# Patient Record
Sex: Female | Born: 1975 | Race: Black or African American | Hispanic: No | Marital: Single | Smoking: Former smoker
Health system: Southern US, Community
[De-identification: ages and names within clinical notes are randomized; demographics above are authoritative.]

## PROBLEM LIST (undated history)

## (undated) ENCOUNTER — Ambulatory Visit (HOSPITAL_COMMUNITY): Discharge status: Absent without leave | Payer: BLUE CROSS/BLUE SHIELD

## (undated) DIAGNOSIS — F319 Bipolar disorder, unspecified: Secondary | ICD-10-CM

## (undated) DIAGNOSIS — F32A Depression, unspecified: Secondary | ICD-10-CM

## (undated) DIAGNOSIS — M1990 Unspecified osteoarthritis, unspecified site: Secondary | ICD-10-CM

## (undated) DIAGNOSIS — I10 Essential (primary) hypertension: Secondary | ICD-10-CM

## (undated) DIAGNOSIS — K219 Gastro-esophageal reflux disease without esophagitis: Secondary | ICD-10-CM

## (undated) DIAGNOSIS — G4730 Sleep apnea, unspecified: Secondary | ICD-10-CM

## (undated) DIAGNOSIS — F4310 Post-traumatic stress disorder, unspecified: Secondary | ICD-10-CM

## (undated) DIAGNOSIS — B1920 Unspecified viral hepatitis C without hepatic coma: Secondary | ICD-10-CM

## (undated) DIAGNOSIS — F419 Anxiety disorder, unspecified: Secondary | ICD-10-CM

## (undated) HISTORY — DX: Essential (primary) hypertension: I10

## (undated) HISTORY — DX: Post-traumatic stress disorder, unspecified: F43.10

## (undated) HISTORY — PX: CHOLECYSTECTOMY: SHX55

## (undated) HISTORY — DX: Depression, unspecified: F32.A

## (undated) HISTORY — DX: Unspecified viral hepatitis C without hepatic coma: B19.20

## (undated) HISTORY — PX: ABDOMINAL HYSTERECTOMY: SHX81

## (undated) HISTORY — PX: FOOT SURGERY: SHX648

## (undated) HISTORY — DX: Bipolar disorder, unspecified: F31.9

## (undated) HISTORY — PX: DILATION AND CURETTAGE OF UTERUS: SHX78

## (undated) HISTORY — PX: KNEE SURGERY: SHX244

---

## 1998-09-25 ENCOUNTER — Ambulatory Visit (HOSPITAL_COMMUNITY): Admission: RE | Admit: 1998-09-25 | Discharge: 1998-09-25 | Payer: Self-pay | Admitting: Orthopedic Surgery

## 1998-09-25 ENCOUNTER — Encounter: Payer: Self-pay | Admitting: Orthopedic Surgery

## 2011-03-13 ENCOUNTER — Emergency Department (HOSPITAL_COMMUNITY): Payer: BC Managed Care – PPO

## 2011-03-13 ENCOUNTER — Emergency Department (HOSPITAL_COMMUNITY)
Admission: EM | Admit: 2011-03-13 | Discharge: 2011-03-13 | Disposition: A | Discharge status: Home / Self Care | Payer: BC Managed Care – PPO | Attending: Emergency Medicine | Admitting: Emergency Medicine

## 2011-03-13 DIAGNOSIS — M79609 Pain in unspecified limb: Secondary | ICD-10-CM | POA: Insufficient documentation

## 2011-03-13 DIAGNOSIS — S60229A Contusion of unspecified hand, initial encounter: Secondary | ICD-10-CM | POA: Insufficient documentation

## 2011-03-13 DIAGNOSIS — W230XXA Caught, crushed, jammed, or pinched between moving objects, initial encounter: Secondary | ICD-10-CM | POA: Insufficient documentation

## 2012-11-07 ENCOUNTER — Emergency Department (HOSPITAL_COMMUNITY)
Admission: EM | Admit: 2012-11-07 | Discharge: 2012-11-07 | Disposition: A | Discharge status: Home / Self Care | Payer: Self-pay | Attending: Family Medicine | Admitting: Family Medicine

## 2012-11-07 ENCOUNTER — Encounter (HOSPITAL_COMMUNITY): Payer: Self-pay | Admitting: Emergency Medicine

## 2012-11-07 ENCOUNTER — Emergency Department (HOSPITAL_COMMUNITY): Payer: Self-pay

## 2012-11-07 DIAGNOSIS — Z79899 Other long term (current) drug therapy: Secondary | ICD-10-CM | POA: Insufficient documentation

## 2012-11-07 DIAGNOSIS — R059 Cough, unspecified: Secondary | ICD-10-CM | POA: Insufficient documentation

## 2012-11-07 DIAGNOSIS — R52 Pain, unspecified: Secondary | ICD-10-CM | POA: Insufficient documentation

## 2012-11-07 DIAGNOSIS — R05 Cough: Secondary | ICD-10-CM | POA: Insufficient documentation

## 2012-11-07 DIAGNOSIS — F411 Generalized anxiety disorder: Secondary | ICD-10-CM | POA: Insufficient documentation

## 2012-11-07 DIAGNOSIS — J3489 Other specified disorders of nose and nasal sinuses: Secondary | ICD-10-CM | POA: Insufficient documentation

## 2012-11-07 DIAGNOSIS — F419 Anxiety disorder, unspecified: Secondary | ICD-10-CM | POA: Insufficient documentation

## 2012-11-07 DIAGNOSIS — J069 Acute upper respiratory infection, unspecified: Secondary | ICD-10-CM | POA: Insufficient documentation

## 2012-11-07 HISTORY — DX: Anxiety disorder, unspecified: F41.9

## 2012-11-07 MED ORDER — HYDROCODONE-HOMATROPINE 5-1.5 MG/5ML PO SYRP: 5.0000 mL | ORAL_SOLUTION | ORAL | Status: DC | PRN

## 2012-11-07 MED ORDER — GUAIFENESIN ER 600 MG PO TB12: 1200.0000 mg | ORAL_TABLET | Freq: Two times a day (BID) | ORAL | Status: DC

## 2012-11-07 NOTE — ED Notes (Signed)
Pt reports she has had cough, nasal congestion, bodyaches  X 2 days. Pt is in no distress.

## 2012-11-07 NOTE — ED Notes (Signed)
Pt c/o URI and productive cough with body aches x 3 days

## 2012-11-12 NOTE — ED Provider Notes (Signed)
History     CSN: [id number]  Arrival date & time 11/07/12  4098   None     Chief Complaint  Patient presents with  . URI  . Cough  . Generalized Body Aches    (Consider location/radiation/quality/duration/timing/severity/associated sxs/prior treatment) HPI SUBJECTIVE:  Theresa Cantrell is a 37 y.o. female who complains of coryza, congestion, nasal blockage, productive cough, myalgias, headache, fever, chills, hoarseness and pain with cough and decreased appetite for 2 days. She denies a history of  asthma.    OBJECTIVE:   Past Medical History  Diagnosis Date  . Anxiety     History reviewed. No pertinent past surgical history.  History reviewed. No pertinent family history.  History  Substance Use Topics  . Smoking status: Not on file  . Smokeless tobacco: Not on file  . Alcohol Use:     OB History    Grav Para Term Preterm Abortions TAB SAB Ect Mult Living                  Review of Systems Ten systems reviewed and are negative for acute change, except as noted in the HPI.   Allergies  Sulfa antibiotics and Ultram  Home Medications   Current Outpatient Rx  Name  Route  Sig  Dispense  Refill  . GUAIFENESIN ER 600 MG PO TB12   Oral   Take 2 tablets (1,200 mg total) by mouth 2 (two) times daily.   40 tablet   0   . HYDROCODONE-HOMATROPINE 5-1.5 MG/5ML PO SYRP   Oral   Take 5 mLs by mouth every 4 (four) hours as needed for cough.   120 mL   0     BP 119/67  Pulse 81  Temp 98.5 F (36.9 C) (Oral)  Resp 18  SpO2 97%  Physical Exam Appears moderately ill but not toxic; temperature as noted in vitals. Ears normal. Eyes:glassy appearance, no discharge  Heart: RRR, NO M/G/R Throat and pharynx normal.   Neck supple. No adenopathyhy in the neck.  Sinuses non tender.  The chest is clear. Abdomen is soft and nontende  ED Course  Procedures (including critical care time)  Labs Reviewed - No data to display No results found.   1. URI (upper  respiratory infection)       MDM  Pt CXR negative for acute infiltrate. Patients symptoms are consistent with URI, likely viral etiology. Discussed that antibiotics are not indicated for viral infections. Pt will be discharged with symptomatic treatment.  Verbalizes understanding and is agreeable with plan. Pt is hemodynamically stable & in NAD prior to dc.         Arthor Captain, PA-C 11/12/12 1432

## 2012-11-14 NOTE — ED Provider Notes (Signed)
Medical screening examination/treatment/procedure(s) were performed by non-physician practitioner and as supervising physician I was immediately available for consultation/collaboration.  Flint Melter, MD 11/14/12 [phone]

## 2013-09-10 ENCOUNTER — Emergency Department (HOSPITAL_COMMUNITY): Payer: No Typology Code available for payment source

## 2013-09-10 ENCOUNTER — Encounter (HOSPITAL_COMMUNITY): Payer: Self-pay | Admitting: Emergency Medicine

## 2013-09-10 ENCOUNTER — Emergency Department (HOSPITAL_COMMUNITY)
Admission: EM | Admit: 2013-09-10 | Discharge: 2013-09-10 | Disposition: A | Discharge status: Home / Self Care | Payer: No Typology Code available for payment source | Attending: Emergency Medicine | Admitting: Emergency Medicine

## 2013-09-10 DIAGNOSIS — M129 Arthropathy, unspecified: Secondary | ICD-10-CM | POA: Insufficient documentation

## 2013-09-10 DIAGNOSIS — M5430 Sciatica, unspecified side: Secondary | ICD-10-CM | POA: Insufficient documentation

## 2013-09-10 DIAGNOSIS — Z79899 Other long term (current) drug therapy: Secondary | ICD-10-CM | POA: Insufficient documentation

## 2013-09-10 DIAGNOSIS — S0993XA Unspecified injury of face, initial encounter: Secondary | ICD-10-CM | POA: Insufficient documentation

## 2013-09-10 DIAGNOSIS — M5441 Lumbago with sciatica, right side: Secondary | ICD-10-CM

## 2013-09-10 DIAGNOSIS — F411 Generalized anxiety disorder: Secondary | ICD-10-CM | POA: Insufficient documentation

## 2013-09-10 DIAGNOSIS — IMO0002 Reserved for concepts with insufficient information to code with codable children: Secondary | ICD-10-CM | POA: Insufficient documentation

## 2013-09-10 DIAGNOSIS — Y92410 Unspecified street and highway as the place of occurrence of the external cause: Secondary | ICD-10-CM | POA: Insufficient documentation

## 2013-09-10 DIAGNOSIS — M25522 Pain in left elbow: Secondary | ICD-10-CM

## 2013-09-10 DIAGNOSIS — S6990XA Unspecified injury of unspecified wrist, hand and finger(s), initial encounter: Secondary | ICD-10-CM | POA: Insufficient documentation

## 2013-09-10 DIAGNOSIS — Y9389 Activity, other specified: Secondary | ICD-10-CM | POA: Insufficient documentation

## 2013-09-10 DIAGNOSIS — F17200 Nicotine dependence, unspecified, uncomplicated: Secondary | ICD-10-CM | POA: Insufficient documentation

## 2013-09-10 DIAGNOSIS — S59909A Unspecified injury of unspecified elbow, initial encounter: Secondary | ICD-10-CM | POA: Insufficient documentation

## 2013-09-10 HISTORY — DX: Unspecified osteoarthritis, unspecified site: M19.90

## 2013-09-10 MED ORDER — PREDNISONE 20 MG PO TABS
60.0000 mg | ORAL_TABLET | Freq: Once | ORAL | Status: AC
  Administered 2013-09-10: 60 mg via ORAL
  Filled 2013-09-10: qty 3

## 2013-09-10 MED ORDER — PREDNISONE 20 MG PO TABS: 60.0000 mg | ORAL_TABLET | Freq: Every day | ORAL | Status: DC

## 2013-09-10 MED ORDER — HYDROCODONE-ACETAMINOPHEN 5-325 MG PO TABS: 1.0000 | ORAL_TABLET | Freq: Four times a day (QID) | ORAL | Status: DC | PRN

## 2013-09-10 MED ORDER — HYDROCODONE-ACETAMINOPHEN 5-325 MG PO TABS
1.0000 | ORAL_TABLET | Freq: Once | ORAL | Status: AC
  Administered 2013-09-10: 1 via ORAL
  Filled 2013-09-10: qty 1
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
## 2013-09-10 NOTE — ED Notes (Signed)
Patient transported to X-ray 

## 2013-09-10 NOTE — ED Notes (Addendum)
Patient transported to X-ray 

## 2013-09-10 NOTE — ED Notes (Signed)
Pt returns xray. 

## 2013-09-12 NOTE — ED Provider Notes (Signed)
Medical screening examination/treatment/procedure(s) were performed by non-physician practitioner and as supervising physician I was immediately available for consultation/collaboration.  EKG Interpretation   None        Amillya Chavira, MD 09/12/13 0324 

## 2014-01-02 ENCOUNTER — Other Ambulatory Visit: Payer: Self-pay | Admitting: *Deleted

## 2014-01-02 DIAGNOSIS — Z780 Asymptomatic menopausal state: Secondary | ICD-10-CM

## 2014-01-10 ENCOUNTER — Encounter: Payer: Self-pay | Admitting: *Deleted

## 2014-02-05 ENCOUNTER — Other Ambulatory Visit: Payer: BC Managed Care – PPO

## 2014-02-05 DIAGNOSIS — Z780 Asymptomatic menopausal state: Secondary | ICD-10-CM

## 2014-02-06 LAB — TSH: TSH: 0.414 u[IU]/mL (ref 0.350–4.500)

## 2014-02-06 LAB — FOLLICLE STIMULATING HORMONE: FSH: 5.1 m[IU]/mL

## 2014-02-07 ENCOUNTER — Encounter: Payer: Self-pay | Admitting: Family Medicine

## 2014-02-07 ENCOUNTER — Ambulatory Visit (INDEPENDENT_AMBULATORY_CARE_PROVIDER_SITE_OTHER): Payer: BC Managed Care – PPO | Admitting: Family Medicine

## 2014-02-07 VITALS — BP 125/85 | HR 80 | Temp 97.4°F | Ht 65.0 in | Wt 153.5 lb

## 2014-02-07 DIAGNOSIS — N951 Menopausal and female climacteric states: Secondary | ICD-10-CM

## 2014-02-07 DIAGNOSIS — R232 Flushing: Secondary | ICD-10-CM

## 2014-02-07 DIAGNOSIS — Z1239 Encounter for other screening for malignant neoplasm of breast: Secondary | ICD-10-CM

## 2014-02-07 DIAGNOSIS — G629 Polyneuropathy, unspecified: Secondary | ICD-10-CM | POA: Insufficient documentation

## 2014-02-07 DIAGNOSIS — G589 Mononeuropathy, unspecified: Secondary | ICD-10-CM

## 2014-02-07 DIAGNOSIS — R634 Abnormal weight loss: Secondary | ICD-10-CM | POA: Insufficient documentation

## 2014-02-07 LAB — CBC
HCT: 40.5 % (ref 36.0–46.0)
Hemoglobin: 13.7 g/dL (ref 12.0–15.0)
MCH: 30.2 pg (ref 26.0–34.0)
MCHC: 33.8 g/dL (ref 30.0–36.0)
MCV: 89.4 fL (ref 78.0–100.0)
PLATELETS: 250 10*3/uL (ref 150–400)
RBC: 4.53 MIL/uL (ref 3.87–5.11)
RDW: 13.8 % (ref 11.5–15.5)
WBC: 6.3 10*3/uL (ref 4.0–10.5)

## 2014-02-07 NOTE — Patient Instructions (Signed)
Menopause Menopause is the normal time of life when menstrual periods stop completely. Menopause is complete when you have missed 12 consecutive menstrual periods. It usually occurs between the ages of 8 years and 48 years. Very rarely does a woman develop menopause before the age of 60 years. At menopause, your ovaries stop producing the female hormones estrogen and progesterone. This can cause undesirable symptoms and also affect your health. Sometimes the symptoms may occur 4 5 years before the menopause begins. There is no relationship between menopause and:  Oral contraceptives.  Number of children you had.  Race.  The age your menstrual periods started (menarche). Heavy smokers and very thin women may develop menopause earlier in life. CAUSES  The ovaries stop producing the female hormones estrogen and progesterone.  Other causes include:  Surgery to remove both ovaries.  The ovaries stop functioning for no known reason.  Tumors of the pituitary gland in the brain.  Medical disease that affects the ovaries and hormone production.  Radiation treatment to the abdomen or pelvis.  Chemotherapy that affects the ovaries. SYMPTOMS   Hot flashes.  Night sweats.  Decrease in sex drive.  Vaginal dryness and thinning of the vagina causing painful intercourse.  Dryness of the skin and developing wrinkles.  Headaches.  Tiredness.  Irritability.  Memory problems.  Weight gain.  Bladder infections.  Hair growth of the face and chest.  Infertility. More serious symptoms include:  Loss of bone (osteoporosis) causing breaks (fractures).  Depression.  Hardening and narrowing of the arteries (atherosclerosis) causing heart attacks and strokes. DIAGNOSIS   When the menstrual periods have stopped for 12 straight months.  Physical exam.  Hormone studies of the blood. TREATMENT  There are many treatment choices and nearly as many questions about them. The  decisions to treat or not to treat menopausal changes is an individual choice made with your health care provider. Your health care provider can discuss the treatments with you. Together, you can decide which treatment will work best for you. Your treatment choices may include:   Hormone therapy (estrogen and progesterone).  Non-hormonal medicines.  Treating the individual symptoms with medicine (for example antidepressants for depression).  Herbal medicines that may help specific symptoms.  Counseling by a psychiatrist or psychologist.  Group therapy.  Lifestyle changes including:  Eating healthy.  Regular exercise.  Limiting caffeine and alcohol.  Stress management and meditation.  No treatment. HOME CARE INSTRUCTIONS   Take the medicine your health care provider gives you as directed.  Get plenty of sleep and rest.  Exercise regularly.  Eat a diet that contains calcium (good for the bones) and soy products (acts like estrogen hormone).  Avoid alcoholic beverages.  Do not smoke.  If you have hot flashes, dress in layers.  Take supplements, calcium, and vitamin D to strengthen bones.  You can use over-the-counter lubricants or moisturizers for vaginal dryness.  Group therapy is sometimes very helpful.  Acupuncture may be helpful in some cases. SEEK MEDICAL CARE IF:   You are not sure you are in menopause.  You are having menopausal symptoms and need advice and treatment.  You are still having menstrual periods after age 69 years.  You have pain with intercourse.  Menopause is complete (no menstrual period for 12 months) and you develop vaginal bleeding.  You need a referral to a specialist (gynecologist, psychiatrist, or psychologist) for treatment. SEEK IMMEDIATE MEDICAL CARE IF:   You have severe depression.  You have excessive vaginal bleeding.  You fell and think you have a broken bone.  You have pain when you urinate.  You develop leg or  chest pain.  You have a fast pounding heart beat (palpitations).  You have severe headaches.  You develop vision problems.  You feel a lump in your breast.  You have abdominal pain or severe indigestion. Document Released: 01/09/2004 Document Revised: 06/21/2013 Document Reviewed: 05/18/2013 [facility] Patient Information 2014 [place], [place].

## 2014-02-07 NOTE — Progress Notes (Signed)
    Subjective:    Patient ID: Theresa Cantrell is a 38 y.o. female presenting with Menopause  on 02/07/2014  HPI: Had partial hysterectomy 14 years ago. Now with hot flashes x 1 year.  Also with vaginal dryness. Night sweats, drenching the bed. Additionally has 80 lb unintended weight loss. No MD visit in some time.  Review of Systems  Constitutional: Negative for fever and chills.  Respiratory: Negative for shortness of breath.   Cardiovascular: Negative for chest pain.  Gastrointestinal: Negative for nausea, vomiting and abdominal pain.  Genitourinary: Positive for vaginal pain. Negative for dysuria.  Skin: Negative for rash.      Objective:    BP 125/85  Pulse 80  Temp(Src) 97.4 F (36.3 C) (Oral)  Ht 5\' 5"  (1.651 m)  Wt 153 lb 8 oz (69.627 kg)  BMI 25.54 kg/m2 Physical Exam  Constitutional: She is oriented to person, place, and time. She appears well-developed and well-nourished. No distress.  HENT:  Head: Normocephalic and atraumatic.  Eyes: No scleral icterus.  Neck: Neck supple.  Cardiovascular: Normal rate.   Pulmonary/Chest: Effort normal.  Abdominal: Soft.  Neurological: She is alert and oriented to person, place, and time.  Skin: Skin is warm and dry.  Psychiatric: She has a normal mood and affect.        Assessment & Plan:  Neuropathy  Hot flashes - Plan: TSH, Follicle stimulating hormone, HIV antibody, CBC, Comprehensive metabolic panel  Weight loss - Plan: TSH, CBC, Comprehensive metabolic panel  Screening for breast cancer - Plan: MM DIGITAL SCREENING BILATERAL  Given weight loss and night sweats, will do more robust panel.  Could be menopausal but would like to rule out other diagnoses.  Also, may need ppd.  Return in about 4 weeks (around 03/07/2014).

## 2014-02-08 ENCOUNTER — Telehealth: Payer: Self-pay

## 2014-02-08 LAB — COMPREHENSIVE METABOLIC PANEL
ALBUMIN: 4 g/dL (ref 3.5–5.2)
ALT: 9 U/L (ref 0–35)
AST: 15 U/L (ref 0–37)
Alkaline Phosphatase: 61 U/L (ref 39–117)
BUN: 14 mg/dL (ref 6–23)
CALCIUM: 9.4 mg/dL (ref 8.4–10.5)
CHLORIDE: 104 meq/L (ref 96–112)
CO2: 25 mEq/L (ref 19–32)
Creat: 0.76 mg/dL (ref 0.50–1.10)
Glucose, Bld: 83 mg/dL (ref 70–99)
POTASSIUM: 4.3 meq/L (ref 3.5–5.3)
Sodium: 138 mEq/L (ref 135–145)
TOTAL PROTEIN: 6.3 g/dL (ref 6.0–8.3)
Total Bilirubin: 0.5 mg/dL (ref 0.2–1.2)

## 2014-02-08 LAB — TSH: TSH: 1.501 u[IU]/mL (ref 0.350–4.500)

## 2014-02-08 LAB — HIV ANTIBODY (ROUTINE TESTING W REFLEX): HIV: NONREACTIVE

## 2014-02-08 LAB — FOLLICLE STIMULATING HORMONE: FSH: 2.6 m[IU]/mL

## 2014-02-08 NOTE — Telephone Encounter (Signed)
Message copied by Geanie Logan on Thu Feb 08, 2014 11:53 AM ------      Message from: Donnamae Jude      Created: Thu Feb 08, 2014 10:30 AM       Labs look completely normal--please inform pt. ------

## 2014-02-08 NOTE — Telephone Encounter (Signed)
Called pt. And informed her of normal labs. Informed pt. To keep follow up appointment in 4 weeks. Pt. Verbalized understanding and had no questions or concerns.

## 2014-02-14 ENCOUNTER — Ambulatory Visit (HOSPITAL_COMMUNITY): Payer: Self-pay

## 2014-02-16 ENCOUNTER — Ambulatory Visit (HOSPITAL_COMMUNITY)
Admission: RE | Admit: 2014-02-16 | Discharge: 2014-02-16 | Disposition: A | Discharge status: Home / Self Care | Payer: BC Managed Care – PPO | Source: Ambulatory Visit | Attending: Family Medicine | Admitting: Family Medicine

## 2014-02-16 DIAGNOSIS — Z1239 Encounter for other screening for malignant neoplasm of breast: Secondary | ICD-10-CM

## 2014-02-16 DIAGNOSIS — Z1231 Encounter for screening mammogram for malignant neoplasm of breast: Secondary | ICD-10-CM | POA: Insufficient documentation

## 2014-02-19 ENCOUNTER — Other Ambulatory Visit: Payer: Self-pay | Admitting: Family Medicine

## 2014-02-19 ENCOUNTER — Telehealth: Payer: Self-pay

## 2014-02-19 DIAGNOSIS — N6310 Unspecified lump in the right breast, unspecified quadrant: Secondary | ICD-10-CM

## 2014-02-19 DIAGNOSIS — R928 Other abnormal and inconclusive findings on diagnostic imaging of breast: Secondary | ICD-10-CM

## 2014-02-19 NOTE — Telephone Encounter (Signed)
Pt. Called. Informed her of results and appointment date, time and location of mammo/ultrasound. Pt. Verbalized understanding. No questions or concerns.

## 2014-02-19 NOTE — Telephone Encounter (Signed)
Message copied by Geanie Logan on Mon Feb 19, 2014 10:00 AM ------      Message from: Donnamae Jude      Created: Fri Feb 16, 2014  3:26 PM       Please call pt. And schedule f/u mammogram ------

## 2014-02-19 NOTE — Telephone Encounter (Signed)
Appointment at [facility] for [facility] and ultrasound of right breast made for Tuesday 4/28 at [place]. Called pt. No answer. Left message stating we are calling with results and of information of an appointment; the appointment is 02/27/14 Tuesday at [place] at the [place] [address]. Call clinic for more information.

## 2014-02-27 ENCOUNTER — Ambulatory Visit
Admission: RE | Admit: 2014-02-27 | Discharge: 2014-02-27 | Disposition: A | Discharge status: Home / Self Care | Payer: BC Managed Care – PPO | Source: Ambulatory Visit | Attending: Family Medicine | Admitting: Family Medicine

## 2014-02-27 DIAGNOSIS — R928 Other abnormal and inconclusive findings on diagnostic imaging of breast: Secondary | ICD-10-CM

## 2014-03-16 ENCOUNTER — Ambulatory Visit (INDEPENDENT_AMBULATORY_CARE_PROVIDER_SITE_OTHER): Payer: BC Managed Care – PPO | Admitting: Obstetrics & Gynecology

## 2014-03-16 ENCOUNTER — Encounter: Payer: Self-pay | Admitting: Obstetrics & Gynecology

## 2014-03-16 ENCOUNTER — Telehealth: Payer: Self-pay | Admitting: General Practice

## 2014-03-16 VITALS — BP 113/76 | HR 74 | Temp 97.5°F | Ht 65.0 in | Wt 154.7 lb

## 2014-03-16 DIAGNOSIS — R634 Abnormal weight loss: Secondary | ICD-10-CM

## 2014-03-16 DIAGNOSIS — R61 Generalized hyperhidrosis: Secondary | ICD-10-CM

## 2014-03-16 NOTE — Telephone Encounter (Signed)
Patient called and left message stating she is calling about her test results. Called patient and she stated she forgot to ask this morning if she was tested for hep c or not back in April. Told patient she was not tested for that. Patient verbalized understanding and had no further questions

## 2014-03-16 NOTE — Patient Instructions (Signed)
Menopause Menopause is the normal time of life when menstrual periods stop completely. Menopause is complete when you have missed 12 consecutive menstrual periods. It usually occurs between the ages of 8 years and 48 years. Very rarely does a woman develop menopause before the age of 60 years. At menopause, your ovaries stop producing the female hormones estrogen and progesterone. This can cause undesirable symptoms and also affect your health. Sometimes the symptoms may occur 4 5 years before the menopause begins. There is no relationship between menopause and:  Oral contraceptives.  Number of children you had.  Race.  The age your menstrual periods started (menarche). Heavy smokers and very thin women may develop menopause earlier in life. CAUSES  The ovaries stop producing the female hormones estrogen and progesterone.  Other causes include:  Surgery to remove both ovaries.  The ovaries stop functioning for no known reason.  Tumors of the pituitary gland in the brain.  Medical disease that affects the ovaries and hormone production.  Radiation treatment to the abdomen or pelvis.  Chemotherapy that affects the ovaries. SYMPTOMS   Hot flashes.  Night sweats.  Decrease in sex drive.  Vaginal dryness and thinning of the vagina causing painful intercourse.  Dryness of the skin and developing wrinkles.  Headaches.  Tiredness.  Irritability.  Memory problems.  Weight gain.  Bladder infections.  Hair growth of the face and chest.  Infertility. More serious symptoms include:  Loss of bone (osteoporosis) causing breaks (fractures).  Depression.  Hardening and narrowing of the arteries (atherosclerosis) causing heart attacks and strokes. DIAGNOSIS   When the menstrual periods have stopped for 12 straight months.  Physical exam.  Hormone studies of the blood. TREATMENT  There are many treatment choices and nearly as many questions about them. The  decisions to treat or not to treat menopausal changes is an individual choice made with your health care provider. Your health care provider can discuss the treatments with you. Together, you can decide which treatment will work best for you. Your treatment choices may include:   Hormone therapy (estrogen and progesterone).  Non-hormonal medicines.  Treating the individual symptoms with medicine (for example antidepressants for depression).  Herbal medicines that may help specific symptoms.  Counseling by a psychiatrist or psychologist.  Group therapy.  Lifestyle changes including:  Eating healthy.  Regular exercise.  Limiting caffeine and alcohol.  Stress management and meditation.  No treatment. HOME CARE INSTRUCTIONS   Take the medicine your health care provider gives you as directed.  Get plenty of sleep and rest.  Exercise regularly.  Eat a diet that contains calcium (good for the bones) and soy products (acts like estrogen hormone).  Avoid alcoholic beverages.  Do not smoke.  If you have hot flashes, dress in layers.  Take supplements, calcium, and vitamin D to strengthen bones.  You can use over-the-counter lubricants or moisturizers for vaginal dryness.  Group therapy is sometimes very helpful.  Acupuncture may be helpful in some cases. SEEK MEDICAL CARE IF:   You are not sure you are in menopause.  You are having menopausal symptoms and need advice and treatment.  You are still having menstrual periods after age 69 years.  You have pain with intercourse.  Menopause is complete (no menstrual period for 12 months) and you develop vaginal bleeding.  You need a referral to a specialist (gynecologist, psychiatrist, or psychologist) for treatment. SEEK IMMEDIATE MEDICAL CARE IF:   You have severe depression.  You have excessive vaginal bleeding.  You fell and think you have a broken bone.  You have pain when you urinate.  You develop leg or  chest pain.  You have a fast pounding heart beat (palpitations).  You have severe headaches.  You develop vision problems.  You feel a lump in your breast.  You have abdominal pain or severe indigestion. Document Released: 01/09/2004 Document Revised: 06/21/2013 Document Reviewed: 05/18/2013 [facility] Patient Information 2014 [place], [place].

## 2014-03-16 NOTE — Progress Notes (Signed)
Subjective:     Patient ID: Theresa Cantrell, female   DOB: 05-20-1976, 38 y.o.   MRN: [id number]  HPI Pt presents for f/u of night sweats.  She reports that she was recently dx'd with Hepatisit C and wonders if that could be the cause of her sx.  Scheduleded with the hepatologist in 1 week.   Review of Systems     Objective:   Physical Exam    4.17.2015 EXAM:  DIGITAL SCREENING BILATERAL MAMMOGRAM WITH CAD  COMPARISON: None  ACR Breast Density Category b: There are scattered areas of  fibroglandular density.  FINDINGS:  In the right breast, a possible masses warrant further evaluation  with spot compression views and possibly ultrasound. In the left  breast, no findings suspicious for malignancy. Images were processed  with CAD.  IMPRESSION:  Further evaluation is suggested for possible masses in the right  breast.  RECOMMENDATION:  Diagnostic mammogram and possibly ultrasound of the right breast.  (Code:[id number])  The patient will be contacted regarding the findings, and additional  imaging will be scheduled.  BI-RADS CATEGORY 0: Incomplete. Need additional imaging evaluation  and/or prior mammograms for comparison.  02/27/2014 EXAM:  DIGITAL DIAGNOSTIC RIGHT MAMMOGRAM  ULTRASOUND RIGHT BREAST  COMPARISON: None.  ACR Breast Density Category b: There are scattered areas of  fibroglandular density.  FINDINGS:  Spot compression views in the lower outer quadrant of the right  breast, anteriorly confirm the presence of a round isodense mass  with circumscribed margins. An oval low-density mass with partially  circumscribed and partially obscured margins is seen in the lower  outer quadrant, posteriorly.  On physical exam, I palpate normal tissue and subareolar region and  lower outer quadrant of the right breast.  Ultrasound is performed, showing 2 adjacent oval homogeneously  hypoechoic masses with circumscribed margins in the 6 o'clock  position, 3 cm from the nipple  measuring 0.6 x 0.3 x 0.6 cm and 0.7  x 0.3 x 0.7 cm. An oval homogeneously hypoechoic mass with  circumscribed margins is seen in 6 o'clock position, 1 cm nipple  measuring 1.0 x 0.4 x 0.5 cm . An oval homogeneously hypoechoic mass  with circumscribed margins is imaged in the 12 o'clock position, 2  cm nipple measuring 0.8 x 0.7 x 0.8 cm. These are most compatible  fibroadenomas. Options are discussed with the patient including  sonographic follow up, percutaneous biopsy or excision. A lymph node  is seen in the 3 o'clock position, 4 cm from the nipple measuring a  maximum of 0.6 cm.  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 Benign lymph node, right  breast.  RECOMMENDATION:  Diagnostic right mammogram and possibly ultrasound in 6 months.  I have discussed the findings and recommendations with the patient.  Results were also provided in writing at the conclusion of the  visit. If applicable, a reminder letter will be sent to the patient  regarding the next appointment.  BI-RADS CATEGORY 3: Probably benign.   LABS; TSH, FSH normal  Assessment:     Night sweats and weight loss- no GYN explanation for sx.  Pt to see hepatologist next week. Reviewed all labs done on last visit      Plan:     Given info sheet on menopause pt notified that she is NOT menopausal    F/u prn 20 min face to face discussion. All questions answered

## 2014-05-21 ENCOUNTER — Encounter (HOSPITAL_COMMUNITY): Payer: Self-pay | Admitting: Emergency Medicine

## 2014-05-21 ENCOUNTER — Emergency Department (HOSPITAL_COMMUNITY)
Admission: EM | Admit: 2014-05-21 | Discharge: 2014-05-21 | Disposition: A | Discharge status: Home / Self Care | Payer: BC Managed Care – PPO | Attending: Emergency Medicine | Admitting: Emergency Medicine

## 2014-05-21 DIAGNOSIS — F17200 Nicotine dependence, unspecified, uncomplicated: Secondary | ICD-10-CM | POA: Insufficient documentation

## 2014-05-21 DIAGNOSIS — M129 Arthropathy, unspecified: Secondary | ICD-10-CM | POA: Insufficient documentation

## 2014-05-21 DIAGNOSIS — M5430 Sciatica, unspecified side: Secondary | ICD-10-CM | POA: Insufficient documentation

## 2014-05-21 DIAGNOSIS — F411 Generalized anxiety disorder: Secondary | ICD-10-CM | POA: Insufficient documentation

## 2014-05-21 DIAGNOSIS — Z8619 Personal history of other infectious and parasitic diseases: Secondary | ICD-10-CM | POA: Insufficient documentation

## 2014-05-21 DIAGNOSIS — Z79899 Other long term (current) drug therapy: Secondary | ICD-10-CM | POA: Insufficient documentation

## 2014-05-21 DIAGNOSIS — M5432 Sciatica, left side: Secondary | ICD-10-CM

## 2014-05-21 MED ORDER — HYDROCODONE-ACETAMINOPHEN 5-325 MG PO TABS: 1.0000 | ORAL_TABLET | ORAL | Status: DC | PRN

## 2014-05-21 MED ORDER — IBUPROFEN 800 MG PO TABS: 800.0000 mg | ORAL_TABLET | Freq: Three times a day (TID) | ORAL | Status: DC

## 2014-05-21 NOTE — ED Notes (Signed)
Pt c/o right lower back and right buttock pain. States has some "tingling" in right buttock and right posterior thigh. Ambulates without difficulty.

## 2014-05-21 NOTE — ED Notes (Signed)
Lower back pain x 8 days unknown reason no injury she states

## 2014-05-21 NOTE — Discharge Instructions (Signed)
Sciatica Sciatica is pain, weakness, numbness, or tingling along the path of the sciatic nerve. The nerve starts in the lower back and runs down the back of each leg. The nerve controls the muscles in the lower leg and in the back of the knee, while also providing sensation to the back of the thigh, lower leg, and the sole of your foot. Sciatica is a symptom of another medical condition. For instance, nerve damage or certain conditions, such as a herniated disk or bone spur on the spine, pinch or put pressure on the sciatic nerve. This causes the pain, weakness, or other sensations normally associated with sciatica. Generally, sciatica only affects one side of the body. CAUSES   Herniated or slipped disc.  Degenerative disk disease.  A pain disorder involving the narrow muscle in the buttocks (piriformis syndrome).  Pelvic injury or fracture.  Pregnancy.  Tumor (rare). SYMPTOMS  Symptoms can vary from mild to very severe. The symptoms usually travel from the low back to the buttocks and down the back of the leg. Symptoms can include:  Mild tingling or dull aches in the lower back, leg, or hip.  Numbness in the back of the calf or sole of the foot.  Burning sensations in the lower back, leg, or hip.  Sharp pains in the lower back, leg, or hip.  Leg weakness.  Severe back pain inhibiting movement. These symptoms may get worse with coughing, sneezing, laughing, or prolonged sitting or standing. Also, being overweight may worsen symptoms. DIAGNOSIS  Your caregiver will perform a physical exam to look for common symptoms of sciatica. He or she may ask you to do certain movements or activities that would trigger sciatic nerve pain. Other tests may be performed to find the cause of the sciatica. These may include:  Blood tests.  X-rays.  Imaging tests, such as an MRI or CT scan. TREATMENT  Treatment is directed at the cause of the sciatic pain. Sometimes, treatment is not necessary  and the pain and discomfort goes away on its own. If treatment is needed, your caregiver may suggest:  Over-the-counter medicines to relieve pain.  Prescription medicines, such as anti-inflammatory medicine, muscle relaxants, or narcotics.  Applying heat or ice to the painful area.  Steroid injections to lessen pain, irritation, and inflammation around the nerve.  Reducing activity during periods of pain.  Exercising and stretching to strengthen your abdomen and improve flexibility of your spine. Your caregiver may suggest losing weight if the extra weight makes the back pain worse.  Physical therapy.  Surgery to eliminate what is pressing or pinching the nerve, such as a bone spur or part of a herniated disk. HOME CARE INSTRUCTIONS   Only take over-the-counter or prescription medicines for pain or discomfort as directed by your caregiver.  Apply ice to the affected area for 20 minutes, 3-4 times a day for the first 48-72 hours. Then try heat in the same way.  Exercise, stretch, or perform your usual activities if these do not aggravate your pain.  Attend physical therapy sessions as directed by your caregiver.  Keep all follow-up appointments as directed by your caregiver.  Do not wear high heels or shoes that do not provide proper support.  Check your mattress to see if it is too soft. A firm mattress may lessen your pain and discomfort. SEEK IMMEDIATE MEDICAL CARE IF:   You lose control of your bowel or bladder (incontinence).  You have increasing weakness in the lower back, pelvis, buttocks,   or legs.  You have redness or swelling of your back.  You have a burning sensation when you urinate.  You have pain that gets worse when you lie down or awakens you at night.  Your pain is worse than you have experienced in the past.  Your pain is lasting longer than 4 weeks.  You are suddenly losing weight without reason. MAKE SURE YOU:  Understand these  instructions.  Will watch your condition.  Will get help right away if you are not doing well or get worse. Document Released: 10/13/2001 Document Revised: 04/19/2012 Document Reviewed: 02/28/2012 [employer] Patient Information 2015 [employer]. This information is not intended to replace advice given to you by your health care provider. Make sure you discuss any questions you have with your health care provider.  

## 2014-05-21 NOTE — ED Provider Notes (Signed)
CSN: [id number]     Arrival date & time 05/21/14  1430 History   First MD Initiated Contact with Patient 05/21/14 1452     This chart was scribed for non-physician practitioner, Charlann Lange PA-C, working with Neta Ehlers, MD by Forrestine Him, ED Scribe. This patient was seen in room TR09C/TR09C and the patient's care was started at 3:17 PM.   Chief Complaint  Patient presents with  . Back Pain   Patient is a 38 y.o. female presenting with back pain. The history is provided by the patient. No language interpreter was used.  Back Pain Location:  Lumbar spine Radiates to:  Does not radiate Pain severity:  Moderate Pain is:  Same all the time Onset quality:  Gradual Timing:  Constant Progression:  Unchanged Chronicity:  New Relieved by:  None tried Worsened by:  Nothing tried Ineffective treatments:  None tried Associated symptoms: no fever, no numbness and no weakness     HPI Comments: Theresa Cantrell is a 38 y.o. female who presents to the Emergency Department complaining of constant, moderate back pain x 8 days that is unchanged. She denies any recent injury or trauma. No alleviating or aggravating factors at this time. She has not tried anything OTC or any home remedies to manage symptoms. At this time she denies any fever or chills. No weakness, loss of sensation, paresthesia, or numbness. Pt with known allergies to Ultram and Sulfa antibiotics. She has no other pertinent past medical history. No other concerns this visit.   Past Medical History  Diagnosis Date  . Anxiety   . Arthritis   . Hepatitis C    Past Surgical History  Procedure Laterality Date  . Cholecystectomy    . Foot surgery Bilateral     two pins in each foot  . Abdominal hysterectomy    . Dilation and curettage of uterus    . Knee surgery     Family History  Problem Relation Age of Onset  . Hypertension Mother   . Cancer Mother     breast  . Anxiety disorder Mother   . Hypertension Father   .  Schizophrenia Father   . Bipolar disorder Father   . Anxiety disorder Father    History  Substance Use Topics  . Smoking status: Current Some Day Smoker -- 0.25 packs/day  . Smokeless tobacco: Never Used  . Alcohol Use: 1.2 oz/week    1 Glasses of wine, 1 Cans of beer per week   OB History   Grav Para Term Preterm Abortions TAB SAB Ect Mult Living   2 1 1  0 1 1 0 0 0 1     Review of Systems  Constitutional: Negative for fever and chills.  Musculoskeletal: Positive for back pain. Negative for neck pain.  Neurological: Negative for weakness and numbness.  Psychiatric/Behavioral: Negative for confusion.      Allergies  Sulfa antibiotics and Ultram  Home Medications   Prior to Admission medications   Medication Sig Start Date End Date Taking? Authorizing Provider  ALPRAZolam Duanne Moron) 0.5 MG tablet Take 0.5 mg by mouth 2 (two) times daily as needed for anxiety.    Historical Provider, MD  Aspirin-Acetaminophen-Caffeine (GOODY HEADACHE PO) Take 1 packet by mouth daily as needed (for pain).    Historical Provider, MD  gabapentin (NEURONTIN) 100 MG capsule Take 100 mg by mouth 2 (two) times daily.    Historical Provider, MD  HYDROcodone-acetaminophen (NORCO/VICODIN) 5-325 MG per tablet Take 1 tablet by  mouth every 6 (six) hours as needed for moderate pain. 09/10/13   Jennifer L Piepenbrink, PA-C  ibuprofen (ADVIL,MOTRIN) 800 MG tablet Take 800 mg by mouth every 8 (eight) hours as needed.    Historical Provider, MD  Multiple Vitamin (MULTIVITAMIN WITH MINERALS) TABS tablet Take 1 tablet by mouth daily.    Historical Provider, MD   Triage Vitals: BP 125/73  Pulse 88  Temp(Src) 98 F (36.7 C)  Resp 16  SpO2 100%   Physical Exam  Nursing note and vitals reviewed. Constitutional: She is oriented to person, place, and time. She appears well-developed and well-nourished.  HENT:  Head: Normocephalic.  Eyes: EOM are normal.  Neck: Normal range of motion.  Pulmonary/Chest: Effort  normal.  Abdominal: She exhibits no distension.  Musculoskeletal: Normal range of motion.  R sciatica tendernes without paralumbar tenderness Normal intact R reflexes  Neurological: She is alert and oriented to person, place, and time.  Psychiatric: She has a normal mood and affect.    ED Course  Procedures (including critical care time)  DIAGNOSTIC STUDIES: Oxygen Saturation is 100% on RA, Normal by my interpretation.    COORDINATION OF CARE: 3:26 PM-Discussed treatment plan with pt at bedside and pt agreed to plan.     Labs Review Labs Reviewed - No data to display  Imaging Review No results found.   EKG Interpretation None      MDM   Final diagnoses:  None    1. Left sciatica  Supportive care and pain management provided. No neurologic deficits. Stable for discharge.   I personally performed the services described in this documentation, which was scribed in my presence. The recorded information has been reviewed and is accurate.    Dewaine Oats, PA-C 05/23/14 1532

## 2014-05-24 NOTE — ED Provider Notes (Signed)
Medical screening examination/treatment/procedure(s) were performed by non-physician practitioner and as supervising physician I was immediately available for consultation/collaboration.   Neta Ehlers, MD 05/24/14 [phone]

## 2014-07-26 ENCOUNTER — Other Ambulatory Visit: Payer: Self-pay | Admitting: Family Medicine

## 2014-07-26 DIAGNOSIS — D241 Benign neoplasm of right breast: Secondary | ICD-10-CM

## 2014-08-29 ENCOUNTER — Ambulatory Visit
Admission: RE | Admit: 2014-08-29 | Discharge: 2014-08-29 | Disposition: A | Discharge status: Home / Self Care | Payer: BC Managed Care – PPO | Source: Ambulatory Visit | Attending: Family Medicine | Admitting: Family Medicine

## 2014-08-29 DIAGNOSIS — D241 Benign neoplasm of right breast: Secondary | ICD-10-CM

## 2014-09-03 ENCOUNTER — Encounter (HOSPITAL_COMMUNITY): Payer: Self-pay | Admitting: Emergency Medicine

## 2015-02-05 ENCOUNTER — Encounter (HOSPITAL_COMMUNITY): Payer: Self-pay

## 2015-02-05 ENCOUNTER — Emergency Department (HOSPITAL_COMMUNITY)
Admission: EM | Admit: 2015-02-05 | Discharge: 2015-02-05 | Disposition: A | Discharge status: Home / Self Care | Payer: BLUE CROSS/BLUE SHIELD | Attending: Emergency Medicine | Admitting: Emergency Medicine

## 2015-02-05 ENCOUNTER — Emergency Department (HOSPITAL_COMMUNITY): Payer: BLUE CROSS/BLUE SHIELD

## 2015-02-05 DIAGNOSIS — G8929 Other chronic pain: Secondary | ICD-10-CM | POA: Diagnosis not present

## 2015-02-05 DIAGNOSIS — Z720 Tobacco use: Secondary | ICD-10-CM | POA: Diagnosis not present

## 2015-02-05 DIAGNOSIS — Y990 Civilian activity done for income or pay: Secondary | ICD-10-CM | POA: Diagnosis not present

## 2015-02-05 DIAGNOSIS — Z8619 Personal history of other infectious and parasitic diseases: Secondary | ICD-10-CM | POA: Diagnosis not present

## 2015-02-05 DIAGNOSIS — Y9289 Other specified places as the place of occurrence of the external cause: Secondary | ICD-10-CM | POA: Insufficient documentation

## 2015-02-05 DIAGNOSIS — M25562 Pain in left knee: Secondary | ICD-10-CM

## 2015-02-05 DIAGNOSIS — Y9389 Activity, other specified: Secondary | ICD-10-CM | POA: Diagnosis not present

## 2015-02-05 DIAGNOSIS — Z79899 Other long term (current) drug therapy: Secondary | ICD-10-CM | POA: Diagnosis not present

## 2015-02-05 DIAGNOSIS — S8992XA Unspecified injury of left lower leg, initial encounter: Secondary | ICD-10-CM | POA: Insufficient documentation

## 2015-02-05 DIAGNOSIS — M1990 Unspecified osteoarthritis, unspecified site: Secondary | ICD-10-CM | POA: Diagnosis not present

## 2015-02-05 DIAGNOSIS — X58XXXA Exposure to other specified factors, initial encounter: Secondary | ICD-10-CM | POA: Insufficient documentation

## 2015-02-05 DIAGNOSIS — Z9889 Other specified postprocedural states: Secondary | ICD-10-CM | POA: Insufficient documentation

## 2015-02-05 DIAGNOSIS — F419 Anxiety disorder, unspecified: Secondary | ICD-10-CM | POA: Diagnosis not present

## 2015-02-05 DIAGNOSIS — Z791 Long term (current) use of non-steroidal anti-inflammatories (NSAID): Secondary | ICD-10-CM | POA: Insufficient documentation

## 2015-02-05 MED ORDER — HYDROCODONE-ACETAMINOPHEN 5-325 MG PO TABS: 2.0000 | ORAL_TABLET | Freq: Four times a day (QID) | ORAL | Status: DC | PRN

## 2015-02-05 NOTE — ED Provider Notes (Signed)
CSN: [id number]     Arrival date & time 02/05/15  0407 History   First MD Initiated Contact with Patient 02/05/15 0559     Chief Complaint  Patient presents with  . Knee Pain     (Consider location/radiation/quality/duration/timing/severity/associated sxs/prior Treatment) HPI Comments: Patient presents emergency department with chief complaint of left knee pain. She states she has a history of chronic left knee pain. She reports having surgery and having a screw placed. She states that she twisted her knee the other day, and felt a pop while at work. She came here for further evaluation. She reports increased pain. She is still able to ambulate, but states that she has to use a brace. She denies taking anything to alleviate symptoms. She has follow-up with orthopedics on Monday.  The history is provided by the patient. No language interpreter was used.    Past Medical History  Diagnosis Date  . Anxiety   . Arthritis   . Hepatitis C    Past Surgical History  Procedure Laterality Date  . Cholecystectomy    . Foot surgery Bilateral     two pins in each foot  . Abdominal hysterectomy    . Dilation and curettage of uterus    . Knee surgery     Family History  Problem Relation Age of Onset  . Hypertension Mother   . Cancer Mother     breast  . Anxiety disorder Mother   . Hypertension Father   . Schizophrenia Father   . Bipolar disorder Father   . Anxiety disorder Father    History  Substance Use Topics  . Smoking status: Current Some Day Smoker -- 0.25 packs/day  . Smokeless tobacco: Never Used  . Alcohol Use: 1.2 oz/week    1 Glasses of wine, 1 Cans of beer per week   OB History    Gravida Para Term Preterm AB TAB SAB Ectopic Multiple Living   2 1 1  0 1 1 0 0 0 1     Review of Systems  Constitutional: Negative for fever and chills.  Respiratory: Negative for shortness of breath.   Cardiovascular: Negative for chest pain.  Gastrointestinal: Negative for nausea,  vomiting, diarrhea and constipation.  Genitourinary: Negative for dysuria.  Musculoskeletal: Positive for arthralgias.  All other systems reviewed and are negative.     Allergies  Sulfa antibiotics and Ultram  Home Medications   Prior to Admission medications   Medication Sig Start Date End Date Taking? Authorizing Provider  ALPRAZolam Duanne Moron) 0.5 MG tablet Take 0.5 mg by mouth 2 (two) times daily as needed for anxiety.   Yes Historical Provider, MD  celecoxib (CELEBREX) 200 MG capsule Take 200 mg by mouth daily.   Yes Historical Provider, MD  gabapentin (NEURONTIN) 600 MG tablet Take 600 mg by mouth 2 (two) times daily.   Yes Historical Provider, MD  ibuprofen (ADVIL,MOTRIN) 800 MG tablet Take 1 tablet (800 mg total) by mouth 3 (three) times daily. 05/21/14  Yes Shari Upstill, PA-C  traZODone (DESYREL) 50 MG tablet Take 25-50 mg by mouth at bedtime as needed for sleep.  01/10/15  Yes Historical Provider, MD  HYDROcodone-acetaminophen (NORCO/VICODIN) 5-325 MG per tablet Take 1 tablet by mouth every 6 (six) hours as needed for moderate pain. Patient not taking: Reported on 02/05/2015 09/10/13   Baron Sane, PA-C  HYDROcodone-acetaminophen (NORCO/VICODIN) 5-325 MG per tablet Take 1-2 tablets by mouth every 4 (four) hours as needed. Patient not taking: Reported on 02/05/2015 05/21/14  Shari Upstill, PA-C   BP 130/82 mmHg  Pulse 98  Temp(Src) 97.8 F (36.6 C) (Oral)  Resp 20  Ht 5\' 5"  (1.651 m)  Wt 150 lb (68.04 kg)  BMI 24.96 kg/m2  SpO2 98% Physical Exam  Constitutional: She is oriented to person, place, and time. She appears well-developed and well-nourished.  HENT:  Head: Normocephalic and atraumatic.  Eyes: Conjunctivae and EOM are normal. Pupils are equal, round, and reactive to light.  Neck: Normal range of motion. Neck supple.  Cardiovascular: Normal rate and regular rhythm.  Exam reveals no gallop and no friction rub.   No murmur heard. Pulmonary/Chest: Effort normal  and breath sounds normal. No respiratory distress. She has no wheezes. She has no rales. She exhibits no tenderness.  Abdominal: Soft. Bowel sounds are normal. She exhibits no distension and no mass. There is no tenderness. There is no rebound and no guarding.  Musculoskeletal: Normal range of motion. She exhibits no edema or tenderness.  Left knee moderately tender to palpation over the lateral aspect, joint stability testing unremarkable for laxity, though the patient does guard, no bony abnormality or deformity, range of motion strength intact  Neurological: She is alert and oriented to person, place, and time.  Skin: Skin is warm and dry.  Psychiatric: She has a normal mood and affect. Her behavior is normal. Judgment and thought content normal.  Nursing note and vitals reviewed.   ED Course  Procedures (including critical care time) Labs Review Labs Reviewed - No data to display  Imaging Review Dg Knee Complete 4 Views Left  02/05/2015   CLINICAL DATA:  Lateral left knee pain tonight. No new injury. Surgery in 2001.  EXAM: LEFT KNEE - COMPLETE 4+ VIEW  COMPARISON:  None.  FINDINGS: Postoperative changes with screw fixation of the tibial tubercle. No significant effusion. No evidence of acute fracture or subluxation. No focal bone lesion or bone destruction. Bone cortex and trabecular architecture appear intact. No radiopaque soft tissue foreign bodies.  IMPRESSION: Screw fixation of the tibial tubercle.  No acute bony abnormalities.   Electronically Signed   By: Lucienne Capers M.D.   On: 02/05/2015 05:07     EKG Interpretation None      MDM   Final diagnoses:  Knee pain, acute, left    Patient with acute on chronic knee pain.  Plain films are negative.  DC to home with ortho follow-up.    Montine Circle, PA-C 02/05/15 [id number]  Everlene Balls, MD 02/05/15 [phone]

## 2015-02-05 NOTE — ED Notes (Signed)
Hx of knee injury in past with screw placed, sts she turned the wrong way and it popped at work and came here for eval

## 2015-02-05 NOTE — Discharge Instructions (Signed)

## 2015-05-09 ENCOUNTER — Encounter (HOSPITAL_COMMUNITY): Payer: Self-pay | Admitting: Emergency Medicine

## 2015-05-09 ENCOUNTER — Emergency Department (HOSPITAL_COMMUNITY)
Admission: EM | Admit: 2015-05-09 | Discharge: 2015-05-09 | Disposition: A | Discharge status: Home / Self Care | Payer: BLUE CROSS/BLUE SHIELD | Attending: Emergency Medicine | Admitting: Emergency Medicine

## 2015-05-09 DIAGNOSIS — H109 Unspecified conjunctivitis: Secondary | ICD-10-CM | POA: Diagnosis not present

## 2015-05-09 DIAGNOSIS — F419 Anxiety disorder, unspecified: Secondary | ICD-10-CM | POA: Diagnosis not present

## 2015-05-09 DIAGNOSIS — M1990 Unspecified osteoarthritis, unspecified site: Secondary | ICD-10-CM | POA: Diagnosis not present

## 2015-05-09 DIAGNOSIS — H00012 Hordeolum externum right lower eyelid: Secondary | ICD-10-CM | POA: Diagnosis not present

## 2015-05-09 DIAGNOSIS — H5711 Ocular pain, right eye: Secondary | ICD-10-CM | POA: Diagnosis present

## 2015-05-09 DIAGNOSIS — Z79899 Other long term (current) drug therapy: Secondary | ICD-10-CM | POA: Diagnosis not present

## 2015-05-09 DIAGNOSIS — Z8619 Personal history of other infectious and parasitic diseases: Secondary | ICD-10-CM | POA: Insufficient documentation

## 2015-05-09 DIAGNOSIS — Z720 Tobacco use: Secondary | ICD-10-CM | POA: Diagnosis not present

## 2015-05-09 DIAGNOSIS — H00013 Hordeolum externum right eye, unspecified eyelid: Secondary | ICD-10-CM

## 2015-05-09 MED ORDER — TOBRAMYCIN 0.3 % OP SOLN
2.0000 [drp] | OPHTHALMIC | Status: DC
  Administered 2015-05-09: 2 [drp] via OPHTHALMIC
  Filled 2015-05-09: qty 5

## 2015-05-09 MED ORDER — TOBRAMYCIN 0.3 % OP SOLN
2.0000 [drp] | OPHTHALMIC | Status: DC
  Filled 2015-05-09: qty 5

## 2015-05-09 MED ORDER — FLUORESCEIN SODIUM 1 MG OP STRP
1.0000 | ORAL_STRIP | Freq: Once | OPHTHALMIC | Status: AC
  Administered 2015-05-09: 1 via OPHTHALMIC
  Filled 2015-05-09: qty 1

## 2015-05-09 MED ORDER — TETRACAINE HCL 0.5 % OP SOLN
2.0000 [drp] | Freq: Once | OPHTHALMIC | Status: AC
  Administered 2015-05-09: 2 [drp] via OPHTHALMIC
  Filled 2015-05-09: qty 2

## 2015-05-09 NOTE — Discharge Instructions (Signed)
Use the eye drops 4 times a day for the next 5 days. Follow up with Dr. Clydene Laming. Do not wear your contacts again until you follow up with him.  Conjunctivitis Conjunctivitis is commonly called "pink eye." Conjunctivitis can be caused by bacterial or viral infection, allergies, or injuries. There is usually redness of the lining of the eye, itching, discomfort, and sometimes discharge. There may be deposits of matter along the eyelids. A viral infection usually causes a watery discharge, while a bacterial infection causes a yellowish, thick discharge. Pink eye is very contagious and spreads by direct contact. You may be given antibiotic eyedrops as part of your treatment. Before using your eye medicine, remove all drainage from the eye by washing gently with warm water and cotton balls. Continue to use the medication until you have awakened 2 mornings in a row without discharge from the eye. Do not rub your eye. This increases the irritation and helps spread infection. Use separate towels from other household members. Wash your hands with soap and water before and after touching your eyes. Use cold compresses to reduce pain and sunglasses to relieve irritation from light. Do not wear contact lenses or wear eye makeup until the infection is gone. SEEK MEDICAL CARE IF:   Your symptoms are not better after 3 days of treatment.  You have increased pain or trouble seeing.  The outer eyelids become very red or swollen. Document Released: 11/26/2004 Document Revised: 01/11/2012 Document Reviewed: 10/19/2005 [facility] Patient Information 2015 [place], [place]. This information is not intended to replace advice given to you by your health care provider. Make sure you discuss any questions you have with your health care provider.

## 2015-05-09 NOTE — ED Provider Notes (Signed)
CSN: [id number]     Arrival date & time 05/09/15  1301 History  This chart was scribed for [facility], NP, working with Dorie Rank, MD by Julien Nordmann, ED Scribe. This patient was seen in room TR04C/TR04C and the patient's care was started at 1:39 PM.    Chief Complaint  Patient presents with  . Eye Problem      Patient is a 39 y.o. female presenting with eye problem. The history is provided by the patient. No language interpreter was used.  Eye Problem Location:  R eye Quality:  Burning Severity:  Moderate Onset quality:  Sudden Duration:  4 days Timing:  Constant Progression:  Worsening Chronicity:  New Relieved by:  Flushing Worsened by:  Contact lenses Ineffective treatments:  None tried Associated symptoms: blurred vision, itching, redness and tearing    HPI Comments: Theresa Cantrell is a 39 y.o. female who presents to the Emergency Department complaining of constant, gradual worsening right eye pain onset 4 days ago. Pt has associated burning, blurred vision, and eye watering.  Pt reports she was doing yard work 4 days ago while wearing contacts and some particles blew into her eyes. Pt states she took her contact out, washed it and left it out for a days which alleviated the pain. She reports that as soon as she put her contacts back in, the pain and burning started again. Pt denies change in vision, fever, and chills.  Past Medical History  Diagnosis Date  . Anxiety   . Arthritis   . Hepatitis C    Past Surgical History  Procedure Laterality Date  . Cholecystectomy    . Foot surgery Bilateral     two pins in each foot  . Abdominal hysterectomy    . Dilation and curettage of uterus    . Knee surgery     Family History  Problem Relation Age of Onset  . Hypertension Mother   . Cancer Mother     breast  . Anxiety disorder Mother   . Hypertension Father   . Schizophrenia Father   . Bipolar disorder Father   . Anxiety disorder Father    History  Substance Use  Topics  . Smoking status: Current Some Day Smoker -- 0.25 packs/day  . Smokeless tobacco: Never Used  . Alcohol Use: 1.2 oz/week    1 Glasses of wine, 1 Cans of beer per week   OB History    Gravida Para Term Preterm AB TAB SAB Ectopic Multiple Living   2 1 1  0 1 1 0 0 0 1     Review of Systems  Constitutional: Negative for fever and chills.  Eyes: Positive for blurred vision, redness, itching and visual disturbance.  All other systems reviewed and are negative.     Allergies  Sulfa antibiotics and Ultram  Home Medications   Prior to Admission medications   Medication Sig Start Date End Date Taking? Authorizing Provider  ALPRAZolam Duanne Moron) 0.5 MG tablet Take 0.5 mg by mouth 2 (two) times daily as needed for anxiety.    Historical Provider, MD  celecoxib (CELEBREX) 200 MG capsule Take 200 mg by mouth daily.    Historical Provider, MD  gabapentin (NEURONTIN) 600 MG tablet Take 600 mg by mouth 2 (two) times daily.    Historical Provider, MD  HYDROcodone-acetaminophen (NORCO/VICODIN) 5-325 MG per tablet Take 2 tablets by mouth every 6 (six) hours as needed. 02/05/15   Montine Circle, PA-C  ibuprofen (ADVIL,MOTRIN) 800 MG tablet Take  1 tablet (800 mg total) by mouth 3 (three) times daily. 05/21/14   Charlann Lange, PA-C  traZODone (DESYREL) 50 MG tablet Take 25-50 mg by mouth at bedtime as needed for sleep.  01/10/15   Historical Provider, MD   Triage vitals: BP 135/82 mmHg  Pulse 75  Temp(Src) 98 F (36.7 C) (Oral)  Resp 16  Ht 5\' 5"  (1.651 m)  Wt 155 lb (70.308 kg)  BMI 25.79 kg/m2  SpO2 100% Physical Exam  Constitutional: She is oriented to person, place, and time. She appears well-developed and well-nourished. No distress.  HENT:  Head: Normocephalic and atraumatic.  Eyes: EOM are normal. Pupils are equal, round, and reactive to light. Right eye exhibits hordeolum. Right eye exhibits no exudate. Right conjunctiva is injected.  Slit lamp exam:      The right eye shows no  fluorescein uptake.  Tiny raised white area to the right lower lid  Neck: Neck supple.  Cardiovascular: Normal rate.   Pulmonary/Chest: Effort normal.  Musculoskeletal: Normal range of motion. She exhibits no edema.  Neurological: She is alert and oriented to person, place, and time. No cranial nerve deficit.  Skin: Skin is warm and dry.  Psychiatric: She has a normal mood and affect. Her behavior is normal.  Nursing note and vitals reviewed.   ED Course  Procedures  Slit lamp is out of order today so unable to examine using slit lamp DIAGNOSTIC STUDIES: Oxygen Saturation is 100% on RA, normal by my interpretation.  COORDINATION OF CARE:  1:45 PM Discussed treatment plan which includes not to wear contacts until follow with with opthalmologist and eye drops given with pt at bedside and pt agreed to plan.   MDM  39 y.o. female with right eye irritation and tiny raised area to right lower lid that appears as early stye. Stable for d/c. Will treat with antibiotic eye drops and she will see her ophthalmologist as soon as possible.      Final diagnoses:  Conjunctivitis of right eye  Hordeolum external, right   I personally performed the services described in this documentation, which was scribed in my presence. The recorded information has been reviewed and is accurate.   [place], NP 05/09/15 [place], MD 05/10/15 [phone]

## 2015-05-09 NOTE — ED Notes (Signed)
Patient states was doing yard work x 4 days ago and got something in her eye.   Patient states that since that time, she has had redness and watering of the R eye.   Patient complains of photophobia.

## 2015-06-24 ENCOUNTER — Other Ambulatory Visit: Payer: Self-pay | Admitting: Family Medicine

## 2015-06-24 DIAGNOSIS — N6310 Unspecified lump in the right breast, unspecified quadrant: Secondary | ICD-10-CM

## 2015-07-02 ENCOUNTER — Other Ambulatory Visit: Payer: Self-pay | Admitting: Family Medicine

## 2015-07-02 ENCOUNTER — Ambulatory Visit
Admission: RE | Admit: 2015-07-02 | Discharge: 2015-07-02 | Disposition: A | Discharge status: Home / Self Care | Payer: BLUE CROSS/BLUE SHIELD | Source: Ambulatory Visit | Attending: Family Medicine | Admitting: Family Medicine

## 2015-07-02 DIAGNOSIS — N6310 Unspecified lump in the right breast, unspecified quadrant: Secondary | ICD-10-CM

## 2015-07-23 ENCOUNTER — Encounter: Payer: BLUE CROSS/BLUE SHIELD | Admitting: Family Medicine

## 2015-08-22 ENCOUNTER — Encounter: Payer: Self-pay | Admitting: Family Medicine

## 2015-08-22 ENCOUNTER — Ambulatory Visit (INDEPENDENT_AMBULATORY_CARE_PROVIDER_SITE_OTHER): Payer: BLUE CROSS/BLUE SHIELD | Admitting: Family Medicine

## 2015-08-22 VITALS — BP 134/93 | HR 84 | Resp 18 | Ht 65.0 in | Wt 164.0 lb

## 2015-08-22 DIAGNOSIS — Z1151 Encounter for screening for human papillomavirus (HPV): Secondary | ICD-10-CM

## 2015-08-22 DIAGNOSIS — F419 Anxiety disorder, unspecified: Secondary | ICD-10-CM

## 2015-08-22 DIAGNOSIS — Z01419 Encounter for gynecological examination (general) (routine) without abnormal findings: Secondary | ICD-10-CM | POA: Diagnosis not present

## 2015-08-22 DIAGNOSIS — Z1272 Encounter for screening for malignant neoplasm of vagina: Secondary | ICD-10-CM

## 2015-08-22 MED ORDER — ALPRAZOLAM 0.5 MG PO TABS: 0.5000 mg | ORAL_TABLET | Freq: Two times a day (BID) | ORAL | Status: DC | PRN

## 2015-08-22 NOTE — Progress Notes (Addendum)
  Subjective:     Theresa Cantrell is a 39 y.o. female and is here for a comprehensive physical exam. The patient reports no problems. S/p hysterectomy 15 year ago for prolapse. No cervix left. Seeing ortho for pain in left knee  Social History   Social History  . Marital Status: Single    Spouse Name: N/A  . Number of Children: N/A  . Years of Education: N/A   Occupational History  . Not on file.   Social History Main Topics  . Smoking status: Current Some Day Smoker -- 0.25 packs/day    Types: Cigarettes  . Smokeless tobacco: Never Used  . Alcohol Use: 1.2 oz/week    1 Glasses of wine, 1 Cans of beer per week  . Drug Use: No  . Sexual Activity: Yes    Birth Control/ Protection: Surgical   Other Topics Concern  . Not on file   Social History Narrative   Health Maintenance  Topic Date Due  . Samul Dada  12/21/1992  . PAP SMEAR  12/21/1994  . INFLUENZA VACCINE  06/03/2015  . HIV Screening  Completed    The following portions of the patient's history were reviewed and updated as appropriate: allergies, current medications, past family history, past medical history, past social history, past surgical history and problem list.  Review of Systems Pertinent items noted in HPI and remainder of comprehensive ROS otherwise negative.   Objective:    BP 134/93 mmHg  Pulse 84  Resp 18  Ht 5\' 5"  (1.651 m)  Wt 164 lb (74.39 kg)  BMI 27.29 kg/m2 General appearance: alert, cooperative and appears stated age Head: Normocephalic, without obvious abnormality, atraumatic Neck: no adenopathy, supple, symmetrical, trachea midline and thyroid not enlarged, symmetric, no tenderness/mass/nodules Lungs: clear to auscultation bilaterally Breasts: normal appearance, no masses or tenderness Heart: regular rate and rhythm, S1, S2 normal, no murmur, click, rub or gallop Abdomen: soft, non-tender; bowel sounds normal; no masses,  no organomegaly Pelvic: external genitalia normal, no  adnexal masses or tenderness, uterus surgically absent and adherent white vaginal discharge Extremities: extremities normal, atraumatic, no cyanosis or edema Pulses: 2+ and symmetric Skin: Skin color, texture, turgor normal. No rashes or lesions Lymph nodes: Cervical, supraclavicular, and axillary nodes normal. Neurologic: Grossly normal    Assessment:    Healthy female exam.      Plan:   Problem List Items Addressed This Visit      Unprioritized   Anxiety    Have refilled Alprazolam x 1--needs PCP for further refills on this medication.      Relevant Medications   ALPRAZolam (XANAX) 0.5 MG tablet    Other Visit Diagnoses    Encounter for routine gynecological examination    -  Primary    Screening for vaginal cancer        Relevant Orders    Cytology - PAP         Labs through work. Declines flu shot. Mammograms q 6 months  Return in 1 year (on 08/21/2016).  See After Visit Summary for Counseling Recommendations

## 2015-08-22 NOTE — Addendum Note (Signed)
Addended by: Donnamae Jude on: 08/22/2015 11:23 AM   Modules accepted: Orders

## 2015-08-22 NOTE — Patient Instructions (Signed)
Preventive Care for Adults, Female A healthy lifestyle and preventive care can promote health and wellness. Preventive health guidelines for women include the following key practices.  A routine yearly physical is a good way to check with your health care provider about your health and preventive screening. It is a chance to share any concerns and updates on your health and to receive a thorough exam.  Visit your dentist for a routine exam and preventive care every 6 months. Brush your teeth twice a day and floss once a day. Good oral hygiene prevents tooth decay and gum disease.  The frequency of eye exams is based on your age, health, family medical history, use of contact lenses, and other factors. Follow your health care provider's recommendations for frequency of eye exams.  Eat a healthy diet. Foods like vegetables, fruits, whole grains, low-fat dairy products, and lean protein foods contain the nutrients you need without too many calories. Decrease your intake of foods high in solid fats, added sugars, and salt. Eat the right amount of calories for you.Get information about a proper diet from your health care provider, if necessary.  Regular physical exercise is one of the most important things you can do for your health. Most adults should get at least 150 minutes of moderate-intensity exercise (any activity that increases your heart rate and causes you to sweat) each week. In addition, most adults need muscle-strengthening exercises on 2 or more days a week.  Maintain a healthy weight. The body mass index (BMI) is a screening tool to identify possible weight problems. It provides an estimate of body fat based on height and weight. Your health care provider can find your BMI and can help you achieve or maintain a healthy weight.For adults 20 years and older:  A BMI below 18.5 is considered underweight.  A BMI of 18.5 to 24.9 is normal.  A BMI of 25 to 29.9 is considered overweight.  A  BMI of 30 and above is considered obese.  Maintain normal blood lipids and cholesterol levels by exercising and minimizing your intake of saturated fat. Eat a balanced diet with plenty of fruit and vegetables. Blood tests for lipids and cholesterol should begin at age 45 and be repeated every 5 years. If your lipid or cholesterol levels are high, you are over 50, or you are at high risk for heart disease, you may need your cholesterol levels checked more frequently.Ongoing high lipid and cholesterol levels should be treated with medicines if diet and exercise are not working.  If you smoke, find out from your health care provider how to quit. If you do not use tobacco, do not start.  Lung cancer screening is recommended for adults aged 45-80 years who are at high risk for developing lung cancer because of a history of smoking. A yearly low-dose CT scan of the lungs is recommended for people who have at least a 30-pack-year history of smoking and are a current smoker or have quit within the past 15 years. A pack year of smoking is smoking an average of 1 pack of cigarettes a day for 1 year (for example: 1 pack a day for 30 years or 2 packs a day for 15 years). Yearly screening should continue until the smoker has stopped smoking for at least 15 years. Yearly screening should be stopped for people who develop a health problem that would prevent them from having lung cancer treatment.  If you are pregnant, do not drink alcohol. If you are  breastfeeding, be very cautious about drinking alcohol. If you are not pregnant and choose to drink alcohol, do not have more than 1 drink per day. One drink is considered to be 12 ounces (355 mL) of beer, 5 ounces (148 mL) of wine, or 1.5 ounces (44 mL) of liquor.  Avoid use of street drugs. Do not share needles with anyone. Ask for help if you need support or instructions about stopping the use of drugs.  High blood pressure causes heart disease and increases the risk  of stroke. Your blood pressure should be checked at least every 1 to 2 years. Ongoing high blood pressure should be treated with medicines if weight loss and exercise do not work.  If you are 55-79 years old, ask your health care provider if you should take aspirin to prevent strokes.  Diabetes screening is done by taking a blood sample to check your blood glucose level after you have not eaten for a certain period of time (fasting). If you are not overweight and you do not have risk factors for diabetes, you should be screened once every 3 years starting at age 45. If you are overweight or obese and you are 40-70 years of age, you should be screened for diabetes every year as part of your cardiovascular risk assessment.  Breast cancer screening is essential preventive care for women. You should practice "breast self-awareness." This means understanding the normal appearance and feel of your breasts and may include breast self-examination. Any changes detected, no matter how small, should be reported to a health care provider. Women in their 20s and 30s should have a clinical breast exam (CBE) by a health care provider as part of a regular health exam every 1 to 3 years. After age 40, women should have a CBE every year. Starting at age 40, women should consider having a mammogram (breast X-ray test) every year. Women who have a family history of breast cancer should talk to their health care provider about genetic screening. Women at a high risk of breast cancer should talk to their health care providers about having an MRI and a mammogram every year.  Breast cancer gene (BRCA)-related cancer risk assessment is recommended for women who have family members with BRCA-related cancers. BRCA-related cancers include breast, ovarian, tubal, and peritoneal cancers. Having family members with these cancers may be associated with an increased risk for harmful changes (mutations) in the breast cancer genes BRCA1 and  BRCA2. Results of the assessment will determine the need for genetic counseling and BRCA1 and BRCA2 testing.  Your health care provider may recommend that you be screened regularly for cancer of the pelvic organs (ovaries, uterus, and vagina). This screening involves a pelvic examination, including checking for microscopic changes to the surface of your cervix (Pap test). You may be encouraged to have this screening done every 3 years, beginning at age 21.  For women ages 30-65, health care providers may recommend pelvic exams and Pap testing every 3 years, or they may recommend the Pap and pelvic exam, combined with testing for human papilloma virus (HPV), every 5 years. Some types of HPV increase your risk of cervical cancer. Testing for HPV may also be done on women of any age with unclear Pap test results.  Other health care providers may not recommend any screening for nonpregnant women who are considered low risk for pelvic cancer and who do not have symptoms. Ask your health care provider if a screening pelvic exam is right for   you.  If you have had past treatment for cervical cancer or a condition that could lead to cancer, you need Pap tests and screening for cancer for at least 20 years after your treatment. If Pap tests have been discontinued, your risk factors (such as having a new sexual partner) need to be reassessed to determine if screening should resume. Some women have medical problems that increase the chance of getting cervical cancer. In these cases, your health care provider may recommend more frequent screening and Pap tests.  Colorectal cancer can be detected and often prevented. Most routine colorectal cancer screening begins at the age of 50 years and continues through age 75 years. However, your health care provider may recommend screening at an earlier age if you have risk factors for colon cancer. On a yearly basis, your health care provider may provide home test kits to check  for hidden blood in the stool. Use of a small camera at the end of a tube, to directly examine the colon (sigmoidoscopy or colonoscopy), can detect the earliest forms of colorectal cancer. Talk to your health care provider about this at age 50, when routine screening begins. Direct exam of the colon should be repeated every 5-10 years through age 75 years, unless early forms of precancerous polyps or small growths are found.  People who are at an increased risk for hepatitis B should be screened for this virus. You are considered at high risk for hepatitis B if:  You were born in a country where hepatitis B occurs often. Talk with your health care provider about which countries are considered high risk.  Your parents were born in a high-risk country and you have not received a shot to protect against hepatitis B (hepatitis B vaccine).  You have HIV or AIDS.  You use needles to inject street drugs.  You live with, or have sex with, someone who has hepatitis B.  You get hemodialysis treatment.  You take certain medicines for conditions like cancer, organ transplantation, and autoimmune conditions.  Hepatitis C blood testing is recommended for all people born from 1945 through 1965 and any individual with known risks for hepatitis C.  Practice safe sex. Use condoms and avoid high-risk sexual practices to reduce the spread of sexually transmitted infections (STIs). STIs include gonorrhea, chlamydia, syphilis, trichomonas, herpes, HPV, and human immunodeficiency virus (HIV). Herpes, HIV, and HPV are viral illnesses that have no cure. They can result in disability, cancer, and death.  You should be screened for sexually transmitted illnesses (STIs) including gonorrhea and chlamydia if:  You are sexually active and are younger than 24 years.  You are older than 24 years and your health care provider tells you that you are at risk for this type of infection.  Your sexual activity has changed  since you were last screened and you are at an increased risk for chlamydia or gonorrhea. Ask your health care provider if you are at risk.  If you are at risk of being infected with HIV, it is recommended that you take a prescription medicine daily to prevent HIV infection. This is called preexposure prophylaxis (PrEP). You are considered at risk if:  You are sexually active and do not regularly use condoms or know the HIV status of your partner(s).  You take drugs by injection.  You are sexually active with a partner who has HIV.  Talk with your health care provider about whether you are at high risk of being infected with HIV. If   you choose to begin PrEP, you should first be tested for HIV. You should then be tested every 3 months for as long as you are taking PrEP.  Osteoporosis is a disease in which the bones lose minerals and strength with aging. This can result in serious bone fractures or breaks. The risk of osteoporosis can be identified using a bone density scan. Women ages 67 years and over and women at risk for fractures or osteoporosis should discuss screening with their health care providers. Ask your health care provider whether you should take a calcium supplement or vitamin D to reduce the rate of osteoporosis.  Menopause can be associated with physical symptoms and risks. Hormone replacement therapy is available to decrease symptoms and risks. You should talk to your health care provider about whether hormone replacement therapy is right for you.  Use sunscreen. Apply sunscreen liberally and repeatedly throughout the day. You should seek shade when your shadow is shorter than you. Protect yourself by wearing long sleeves, pants, a wide-brimmed hat, and sunglasses year round, whenever you are outdoors.  Once a month, do a whole body skin exam, using a mirror to look at the skin on your back. Tell your health care provider of new moles, moles that have irregular borders, moles that  are larger than a pencil eraser, or moles that have changed in shape or color.  Stay current with required vaccines (immunizations).  Influenza vaccine. All adults should be immunized every year.  Tetanus, diphtheria, and acellular pertussis (Td, Tdap) vaccine. Pregnant women should receive 1 dose of Tdap vaccine during each pregnancy. The dose should be obtained regardless of the length of time since the last dose. Immunization is preferred during the 27th-36th week of gestation. An adult who has not previously received Tdap or who does not know her vaccine status should receive 1 dose of Tdap. This initial dose should be followed by tetanus and diphtheria toxoids (Td) booster doses every 10 years. Adults with an unknown or incomplete history of completing a 3-dose immunization series with Td-containing vaccines should begin or complete a primary immunization series including a Tdap dose. Adults should receive a Td booster every 10 years.  Varicella vaccine. An adult without evidence of immunity to varicella should receive 2 doses or a second dose if she has previously received 1 dose. Pregnant females who do not have evidence of immunity should receive the first dose after pregnancy. This first dose should be obtained before leaving the health care facility. The second dose should be obtained 4-8 weeks after the first dose.  Human papillomavirus (HPV) vaccine. Females aged 13-26 years who have not received the vaccine previously should obtain the 3-dose series. The vaccine is not recommended for use in pregnant females. However, pregnancy testing is not needed before receiving a dose. If a female is found to be pregnant after receiving a dose, no treatment is needed. In that case, the remaining doses should be delayed until after the pregnancy. Immunization is recommended for any person with an immunocompromised condition through the age of 61 years if she did not get any or all doses earlier. During the  3-dose series, the second dose should be obtained 4-8 weeks after the first dose. The third dose should be obtained 24 weeks after the first dose and 16 weeks after the second dose.  Zoster vaccine. One dose is recommended for adults aged 30 years or older unless certain conditions are present.  Measles, mumps, and rubella (MMR) vaccine. Adults born  before 1957 generally are considered immune to measles and mumps. Adults born in 1957 or later should have 1 or more doses of MMR vaccine unless there is a contraindication to the vaccine or there is laboratory evidence of immunity to each of the three diseases. A routine second dose of MMR vaccine should be obtained at least 28 days after the first dose for students attending postsecondary schools, health care workers, or international travelers. People who received inactivated measles vaccine or an unknown type of measles vaccine during 1963-1967 should receive 2 doses of MMR vaccine. People who received inactivated mumps vaccine or an unknown type of mumps vaccine before 1979 and are at high risk for mumps infection should consider immunization with 2 doses of MMR vaccine. For females of childbearing age, rubella immunity should be determined. If there is no evidence of immunity, females who are not pregnant should be vaccinated. If there is no evidence of immunity, females who are pregnant should delay immunization until after pregnancy. Unvaccinated health care workers born before 1957 who lack laboratory evidence of measles, mumps, or rubella immunity or laboratory confirmation of disease should consider measles and mumps immunization with 2 doses of MMR vaccine or rubella immunization with 1 dose of MMR vaccine.  Pneumococcal 13-valent conjugate (PCV13) vaccine. When indicated, a person who is uncertain of his immunization history and has no record of immunization should receive the PCV13 vaccine. All adults 65 years of age and older should receive this  vaccine. An adult aged 19 years or older who has certain medical conditions and has not been previously immunized should receive 1 dose of PCV13 vaccine. This PCV13 should be followed with a dose of pneumococcal polysaccharide (PPSV23) vaccine. Adults who are at high risk for pneumococcal disease should obtain the PPSV23 vaccine at least 8 weeks after the dose of PCV13 vaccine. Adults older than 41 years of age who have normal immune system function should obtain the PPSV23 vaccine dose at least 1 year after the dose of PCV13 vaccine.  Pneumococcal polysaccharide (PPSV23) vaccine. When PCV13 is also indicated, PCV13 should be obtained first. All adults aged 65 years and older should be immunized. An adult younger than age 65 years who has certain medical conditions should be immunized. Any person who resides in a nursing home or long-term care facility should be immunized. An adult smoker should be immunized. People with an immunocompromised condition and certain other conditions should receive both PCV13 and PPSV23 vaccines. People with human immunodeficiency virus (HIV) infection should be immunized as soon as possible after diagnosis. Immunization during chemotherapy or radiation therapy should be avoided. Routine use of PPSV23 vaccine is not recommended for American Indians, [place] Natives, or people younger than 65 years unless there are medical conditions that require PPSV23 vaccine. When indicated, people who have unknown immunization and have no record of immunization should receive PPSV23 vaccine. One-time revaccination 5 years after the first dose of PPSV23 is recommended for people aged 19-64 years who have chronic kidney failure, nephrotic syndrome, asplenia, or immunocompromised conditions. People who received 1-2 doses of PPSV23 before age 65 years should receive another dose of PPSV23 vaccine at age 65 years or later if at least 5 years have passed since the previous dose. Doses of PPSV23 are not  needed for people immunized with PPSV23 at or after age 65 years.  Meningococcal vaccine. Adults with asplenia or persistent complement component deficiencies should receive 2 doses of quadrivalent meningococcal conjugate (MenACWY-D) vaccine. The doses should be obtained   at least 2 months apart. Microbiologists working with certain meningococcal bacteria, [place] recruits, people at risk during an outbreak, and people who travel to or live in countries with a high rate of meningitis should be immunized. A first-year college student up through age 34 years who is living in a residence hall should receive a dose if she did not receive a dose on or after her 16th birthday. Adults who have certain high-risk conditions should receive one or more doses of vaccine.  Hepatitis A vaccine. Adults who wish to be protected from this disease, have certain high-risk conditions, work with hepatitis A-infected animals, work in hepatitis A research labs, or travel to or work in countries with a high rate of hepatitis A should be immunized. Adults who were previously unvaccinated and who anticipate close contact with an international adoptee during the first 60 days after arrival in the [place] from a country with a high rate of hepatitis A should be immunized.  Hepatitis B vaccine. Adults who wish to be protected from this disease, have certain high-risk conditions, may be exposed to blood or other infectious body fluids, are household contacts or sex partners of hepatitis B positive people, are clients or workers in certain care facilities, or travel to or work in countries with a high rate of hepatitis B should be immunized.  Haemophilus influenzae type b (Hib) vaccine. A previously unvaccinated person with asplenia or sickle cell disease or having a scheduled splenectomy should receive 1 dose of Hib vaccine. Regardless of previous immunization, a recipient of a hematopoietic stem cell transplant should receive a  3-dose series 6-12 months after her successful transplant. Hib vaccine is not recommended for adults with HIV infection. Preventive Services / Frequency Ages 35 to 4 years  Blood pressure check.** / Every 3-5 years.  Lipid and cholesterol check.** / Every 5 years beginning at age 60.  Clinical breast exam.** / Every 3 years for women in their 71s and 10s.  BRCA-related cancer risk assessment.** / For women who have family members with a BRCA-related cancer (breast, ovarian, tubal, or peritoneal cancers).  Pap test.** / Every 2 years from ages 76 through 26. Every 3 years starting at age 61 through age 76 or 93 with a history of 3 consecutive normal Pap tests.  HPV screening.** / Every 3 years from ages 37 through ages 60 to 51 with a history of 3 consecutive normal Pap tests.  Hepatitis C blood test.** / For any individual with known risks for hepatitis C.  Skin self-exam. / Monthly.  Influenza vaccine. / Every year.  Tetanus, diphtheria, and acellular pertussis (Tdap, Td) vaccine.** / Consult your health care provider. Pregnant women should receive 1 dose of Tdap vaccine during each pregnancy. 1 dose of Td every 10 years.  Varicella vaccine.** / Consult your health care provider. Pregnant females who do not have evidence of immunity should receive the first dose after pregnancy.  HPV vaccine. / 3 doses over 6 months, if 93 and younger. The vaccine is not recommended for use in pregnant females. However, pregnancy testing is not needed before receiving a dose.  Measles, mumps, rubella (MMR) vaccine.** / You need at least 1 dose of MMR if you were born in 1957 or later. You may also need a 2nd dose. For females of childbearing age, rubella immunity should be determined. If there is no evidence of immunity, females who are not pregnant should be vaccinated. If there is no evidence of immunity, females who are  pregnant should delay immunization until after pregnancy.  Pneumococcal  13-valent conjugate (PCV13) vaccine.** / Consult your health care provider.  Pneumococcal polysaccharide (PPSV23) vaccine.** / 1 to 2 doses if you smoke cigarettes or if you have certain conditions.  Meningococcal vaccine.** / 1 dose if you are age 68 to 8 years and a Market researcher living in a residence hall, or have one of several medical conditions, you need to get vaccinated against meningococcal disease. You may also need additional booster doses.  Hepatitis A vaccine.** / Consult your health care provider.  Hepatitis B vaccine.** / Consult your health care provider.  Haemophilus influenzae type b (Hib) vaccine.** / Consult your health care provider. Ages 7 to 53 years  Blood pressure check.** / Every year.  Lipid and cholesterol check.** / Every 5 years beginning at age 25 years.  Lung cancer screening. / Every year if you are aged 11-80 years and have a 30-pack-year history of smoking and currently smoke or have quit within the past 15 years. Yearly screening is stopped once you have quit smoking for at least 15 years or develop a health problem that would prevent you from having lung cancer treatment.  Clinical breast exam.** / Every year after age 48 years.  BRCA-related cancer risk assessment.** / For women who have family members with a BRCA-related cancer (breast, ovarian, tubal, or peritoneal cancers).  Mammogram.** / Every year beginning at age 41 years and continuing for as long as you are in good health. Consult with your health care provider.  Pap test.** / Every 3 years starting at age 65 years through age 37 or 70 years with a history of 3 consecutive normal Pap tests.  HPV screening.** / Every 3 years from ages 72 years through ages 60 to 40 years with a history of 3 consecutive normal Pap tests.  Fecal occult blood test (FOBT) of stool. / Every year beginning at age 21 years and continuing until age 5 years. You may not need to do this test if you get  a colonoscopy every 10 years.  Flexible sigmoidoscopy or colonoscopy.** / Every 5 years for a flexible sigmoidoscopy or every 10 years for a colonoscopy beginning at age 35 years and continuing until age 48 years.  Hepatitis C blood test.** / For all people born from [birth year] through 1965 and any individual with known risks for hepatitis C.  Skin self-exam. / Monthly.  Influenza vaccine. / Every year.  Tetanus, diphtheria, and acellular pertussis (Tdap/Td) vaccine.** / Consult your health care provider. Pregnant women should receive 1 dose of Tdap vaccine during each pregnancy. 1 dose of Td every 10 years.  Varicella vaccine.** / Consult your health care provider. Pregnant females who do not have evidence of immunity should receive the first dose after pregnancy.  Zoster vaccine.** / 1 dose for adults aged 30 years or older.  Measles, mumps, rubella (MMR) vaccine.** / You need at least 1 dose of MMR if you were born in 1957 or later. You may also need a second dose. For females of childbearing age, rubella immunity should be determined. If there is no evidence of immunity, females who are not pregnant should be vaccinated. If there is no evidence of immunity, females who are pregnant should delay immunization until after pregnancy.  Pneumococcal 13-valent conjugate (PCV13) vaccine.** / Consult your health care provider.  Pneumococcal polysaccharide (PPSV23) vaccine.** / 1 to 2 doses if you smoke cigarettes or if you have certain conditions.  Meningococcal vaccine.** /  Consult your health care provider.  Hepatitis A vaccine.** / Consult your health care provider.  Hepatitis B vaccine.** / Consult your health care provider.  Haemophilus influenzae type b (Hib) vaccine.** / Consult your health care provider. Ages 64 years and over  Blood pressure check.** / Every year.  Lipid and cholesterol check.** / Every 5 years beginning at age 23 years.  Lung cancer screening. / Every year if you  are aged 16-80 years and have a 30-pack-year history of smoking and currently smoke or have quit within the past 15 years. Yearly screening is stopped once you have quit smoking for at least 15 years or develop a health problem that would prevent you from having lung cancer treatment.  Clinical breast exam.** / Every year after age 74 years.  BRCA-related cancer risk assessment.** / For women who have family members with a BRCA-related cancer (breast, ovarian, tubal, or peritoneal cancers).  Mammogram.** / Every year beginning at age 44 years and continuing for as long as you are in good health. Consult with your health care provider.  Pap test.** / Every 3 years starting at age 58 years through age 22 or 39 years with 3 consecutive normal Pap tests. Testing can be stopped between 65 and 70 years with 3 consecutive normal Pap tests and no abnormal Pap or HPV tests in the past 10 years.  HPV screening.** / Every 3 years from ages 64 years through ages 70 or 61 years with a history of 3 consecutive normal Pap tests. Testing can be stopped between 65 and 70 years with 3 consecutive normal Pap tests and no abnormal Pap or HPV tests in the past 10 years.  Fecal occult blood test (FOBT) of stool. / Every year beginning at age 40 years and continuing until age 27 years. You may not need to do this test if you get a colonoscopy every 10 years.  Flexible sigmoidoscopy or colonoscopy.** / Every 5 years for a flexible sigmoidoscopy or every 10 years for a colonoscopy beginning at age 7 years and continuing until age 32 years.  Hepatitis C blood test.** / For all people born from [birth year] through 1967 and any individual with known risks for hepatitis C.  Osteoporosis screening.** / A one-time screening for women ages 30 years and over and women at risk for fractures or osteoporosis.  Skin self-exam. / Monthly.  Influenza vaccine. / Every year.  Tetanus, diphtheria, and acellular pertussis (Tdap/Td)  vaccine.** / 1 dose of Td every 10 years.  Varicella vaccine.** / Consult your health care provider.  Zoster vaccine.** / 1 dose for adults aged 35 years or older.  Pneumococcal 13-valent conjugate (PCV13) vaccine.** / Consult your health care provider.  Pneumococcal polysaccharide (PPSV23) vaccine.** / 1 dose for all adults aged 46 years and older.  Meningococcal vaccine.** / Consult your health care provider.  Hepatitis A vaccine.** / Consult your health care provider.  Hepatitis B vaccine.** / Consult your health care provider.  Haemophilus influenzae type b (Hib) vaccine.** / Consult your health care provider. ** Family history and personal history of risk and conditions may change your health care provider's recommendations.   This information is not intended to replace advice given to you by your health care provider. Make sure you discuss any questions you have with your health care provider.   Document Released: 12/15/2001 Document Revised: 11/09/2014 Document Reviewed: 03/16/2011 Elsevier Interactive Patient Education [employer].

## 2015-08-22 NOTE — Assessment & Plan Note (Signed)
Have refilled Alprazolam x 1--needs PCP for further refills on this medication.

## 2015-08-23 LAB — CYTOLOGY - PAP

## 2016-11-02 HISTORY — PX: JOINT REPLACEMENT: SHX530

## 2016-11-02 HISTORY — PX: KNEE SURGERY: SHX244

## 2017-02-09 ENCOUNTER — Emergency Department (HOSPITAL_COMMUNITY): Payer: Self-pay

## 2017-02-09 ENCOUNTER — Encounter (HOSPITAL_COMMUNITY): Payer: Self-pay

## 2017-02-09 ENCOUNTER — Emergency Department (HOSPITAL_COMMUNITY)
Admission: EM | Admit: 2017-02-09 | Discharge: 2017-02-10 | Disposition: A | Discharge status: Home / Self Care | Payer: Self-pay | Attending: Emergency Medicine | Admitting: Emergency Medicine

## 2017-02-09 DIAGNOSIS — M25571 Pain in right ankle and joints of right foot: Secondary | ICD-10-CM | POA: Insufficient documentation

## 2017-02-09 DIAGNOSIS — Y9289 Other specified places as the place of occurrence of the external cause: Secondary | ICD-10-CM | POA: Insufficient documentation

## 2017-02-09 DIAGNOSIS — Y999 Unspecified external cause status: Secondary | ICD-10-CM | POA: Insufficient documentation

## 2017-02-09 DIAGNOSIS — F17210 Nicotine dependence, cigarettes, uncomplicated: Secondary | ICD-10-CM | POA: Insufficient documentation

## 2017-02-09 DIAGNOSIS — M25561 Pain in right knee: Secondary | ICD-10-CM | POA: Insufficient documentation

## 2017-02-09 DIAGNOSIS — Y939 Activity, unspecified: Secondary | ICD-10-CM | POA: Insufficient documentation

## 2017-02-09 DIAGNOSIS — W19XXXA Unspecified fall, initial encounter: Secondary | ICD-10-CM

## 2017-02-09 DIAGNOSIS — W172XXA Fall into hole, initial encounter: Secondary | ICD-10-CM | POA: Insufficient documentation

## 2017-02-09 MED ORDER — IBUPROFEN 800 MG PO TABS: 800.0000 mg | ORAL_TABLET | Freq: Three times a day (TID) | ORAL | 0 refills | Status: DC

## 2017-02-09 MED ORDER — CYCLOBENZAPRINE HCL 10 MG PO TABS: 10.0000 mg | ORAL_TABLET | Freq: Two times a day (BID) | ORAL | 0 refills | Status: DC | PRN

## 2017-02-09 NOTE — ED Provider Notes (Signed)
[facility] DEPT Provider Note   CSN: [id number] Arrival date & time: 02/09/17  2117     History   Chief Complaint Chief Complaint  Patient presents with  . Fall    HPI Theresa Cantrell is a 41 y.o. female.  Patient presents with pain in her right knee and right ankle after fall in an open manhole around 8:00 pm tonight. She has been able to walk since the fall with assistance. No head, chest, abdominal injury. No wound.    The history is provided by the patient. No language interpreter was used.  Fall  Pertinent negatives include no chest pain and no abdominal pain.    Past Medical History:  Diagnosis Date  . Anxiety   . Arthritis   . Hepatitis C     Patient Active Problem List   Diagnosis Date Noted  . Neuropathy ([place]) 02/07/2014  . Hot flashes 02/07/2014  . Weight loss 02/07/2014  . Anxiety     Past Surgical History:  Procedure Laterality Date  . ABDOMINAL HYSTERECTOMY    . CHOLECYSTECTOMY    . DILATION AND CURETTAGE OF UTERUS    . FOOT SURGERY Bilateral    two pins in each foot  . KNEE SURGERY      OB History    Gravida Para Term Preterm AB Living   2 1 1  0 1 1   SAB TAB Ectopic Multiple Live Births   0 1 0 0         Home Medications    Prior to Admission medications   Medication Sig Start Date End Date Taking? Authorizing Provider  ALPRAZolam Duanne Moron) 0.5 MG tablet Take 1 tablet (0.5 mg total) by mouth 2 (two) times daily as needed for anxiety. 08/22/15   Donnamae Jude, MD  gabapentin (NEURONTIN) 600 MG tablet Take 600 mg by mouth 2 (two) times daily.    Historical Provider, MD  HYDROcodone-acetaminophen (NORCO/VICODIN) 5-325 MG per tablet Take 2 tablets by mouth every 6 (six) hours as needed. 02/05/15   Montine Circle, PA-C  ibuprofen (ADVIL,MOTRIN) 800 MG tablet Take 1 tablet (800 mg total) by mouth 3 (three) times daily. 05/21/14   Charlann Lange, PA-C  meloxicam (MOBIC) 15 MG tablet Take 15 mg by mouth daily.    Historical Provider, MD    Multiple Vitamin (MULTI VITAMIN PO) Take by mouth.    Historical Provider, MD  traZODone (DESYREL) 50 MG tablet Take 25-50 mg by mouth at bedtime as needed for sleep.  01/10/15   Historical Provider, MD    Family History Family History  Problem Relation Age of Onset  . Hypertension Mother   . Cancer Mother     breast  . Anxiety disorder Mother   . Hypertension Father   . Schizophrenia Father   . Bipolar disorder Father   . Anxiety disorder Father     Social History Social History  Substance Use Topics  . Smoking status: Current Some Day Smoker    Packs/day: 0.25    Types: Cigarettes  . Smokeless tobacco: Never Used  . Alcohol use 1.2 oz/week    1 Glasses of wine, 1 Cans of beer per week     Allergies   Patient has no active allergies.   Review of Systems Review of Systems  Constitutional: Negative for diaphoresis.  Cardiovascular: Negative.  Negative for chest pain.  Gastrointestinal: Negative.  Negative for abdominal pain.  Musculoskeletal: Negative for back pain and neck pain.  See HPI.  Skin: Negative.  Negative for wound.  Neurological: Negative.  Negative for weakness and numbness.     Physical Exam Updated Vital Signs BP (!) 130/101   Pulse 91   Temp 97.9 F (36.6 C) (Oral)   Resp 18   Ht 5\' 5"  (1.651 m)   Wt 79.4 kg   SpO2 100%   BMI 29.12 kg/m   Physical Exam  Constitutional: She is oriented to person, place, and time. She appears well-developed and well-nourished.  Neck: Normal range of motion.  Cardiovascular: Intact distal pulses.   Pulmonary/Chest: Effort normal. She exhibits no tenderness.  Abdominal: There is no tenderness.  Musculoskeletal:  Right LE without swelling or bony deformity. FROM all joints without strength deficit. Minimal tenderness to posterior mid-calf. No discoloration.   Neurological: She is alert and oriented to person, place, and time.  Skin: Skin is warm and dry.     ED Treatments / Results  Labs (all  labs ordered are listed, but only abnormal results are displayed) Labs Reviewed - No data to display  EKG  EKG Interpretation None       Radiology Dg Knee Complete 4 Views Right  Result Date: 02/09/2017 CLINICAL DATA:  Status post fall, with right knee pain. Initial encounter. EXAM: RIGHT KNEE - COMPLETE 4+ VIEW COMPARISON:  None. FINDINGS: There is no evidence of fracture or dislocation. Mild marginal osteophyte formation is noted at the medial compartment. The joint spaces are preserved. Mild cortical irregularity is noted along the articular surface of the patella. No significant joint effusion is seen. The visualized soft tissues are normal in appearance. IMPRESSION: 1. No evidence of fracture or dislocation. 2. Minimal degenerative change at the patellofemoral and medial compartments. Electronically Signed   By: Garald Balding M.D.   On: 02/09/2017 23:10   Dg Foot Complete Right  Result Date: 02/09/2017 CLINICAL DATA:  Tripped [place] city [place] and fell, with injury to the right foot. Pain at the mid calcaneus. Initial encounter. EXAM: RIGHT FOOT COMPLETE - 3+ VIEW COMPARISON:  None. FINDINGS: There is no evidence of fracture or dislocation. Postoperative change is noted at the distal first metatarsal. The joint spaces are preserved. There is no evidence of talar subluxation; the subtalar joint is unremarkable in appearance. No significant soft tissue abnormalities are seen. IMPRESSION: No evidence of fracture or dislocation. Electronically Signed   By: Garald Balding M.D.   On: 02/09/2017 23:03    Procedures Procedures (including critical care time)  Medications Ordered in ED Medications - No data to display   Initial Impression / Assessment and Plan / ED Course  I have reviewed the triage vital signs and the nursing notes.  Pertinent labs & imaging results that were available during my care of the patient were reviewed by me and considered in my medical decision making (see chart  for details).     Patient presents after fall with c/o right knee and ankle pain. Imaging negative for fracture. No evidence ligamentous injury. Will provide crutches for comfortable ambulation.   Final Clinical Impressions(s) / ED Diagnoses   Final diagnoses:  None   1. Fall 2. Right knee pain 3. Right ankle pain  New Prescriptions New Prescriptions   No medications on file     Charlann Lange, Hershal Coria 02/09/17 [place], MD 02/11/17 [phone]

## 2017-02-09 NOTE — ED Triage Notes (Signed)
Pt reports falling in a pot hole today, complaints of pain in her right knee and right foot, ambulatory with cane.

## 2017-07-28 ENCOUNTER — Encounter (HOSPITAL_COMMUNITY): Payer: Self-pay | Admitting: Emergency Medicine

## 2017-07-28 ENCOUNTER — Emergency Department (HOSPITAL_COMMUNITY)
Admission: EM | Admit: 2017-07-28 | Discharge: 2017-07-28 | Disposition: A | Discharge status: Home / Self Care | Payer: BLUE CROSS/BLUE SHIELD | Attending: Emergency Medicine | Admitting: Emergency Medicine

## 2017-07-28 DIAGNOSIS — Z79899 Other long term (current) drug therapy: Secondary | ICD-10-CM | POA: Insufficient documentation

## 2017-07-28 DIAGNOSIS — F17210 Nicotine dependence, cigarettes, uncomplicated: Secondary | ICD-10-CM | POA: Insufficient documentation

## 2017-07-28 DIAGNOSIS — R0602 Shortness of breath: Secondary | ICD-10-CM | POA: Insufficient documentation

## 2017-07-28 DIAGNOSIS — T7840XA Allergy, unspecified, initial encounter: Secondary | ICD-10-CM | POA: Insufficient documentation

## 2017-07-28 MED ORDER — FAMOTIDINE 20 MG PO TABS
20.0000 mg | ORAL_TABLET | Freq: Once | ORAL | Status: AC
  Administered 2017-07-28: 20 mg via ORAL
  Filled 2017-07-28: qty 1

## 2017-07-28 MED ORDER — DIPHENHYDRAMINE HCL 25 MG PO CAPS
25.0000 mg | ORAL_CAPSULE | Freq: Once | ORAL | Status: AC
  Administered 2017-07-28: 25 mg via ORAL
  Filled 2017-07-28: qty 1

## 2017-07-28 MED ORDER — PREDNISONE 20 MG PO TABS
60.0000 mg | ORAL_TABLET | Freq: Once | ORAL | Status: AC
  Administered 2017-07-28: 60 mg via ORAL
  Filled 2017-07-28: qty 3

## 2017-07-28 NOTE — Discharge Instructions (Signed)
Please read instructions below. Take 25mg  of benadryl every 6 hours, and 20mg  of pepcid every 12 hours for rash and itching.  Schedule an appointment with your primary care to follow up on your visit today. Return to the ER immediately for feeling your throat closing, swelling of your lips or tongue, difficulty breathing, or new or concerning symptoms.

## 2017-07-28 NOTE — ED Triage Notes (Signed)
To ED via [facility] -- from work-- works at a palnt with chemicals and there was a strong citrus smell-- caused pt to have shortness of breath and possible panioc attack per pt-- has had meds changed recently-- on arrival - pt is pain free, resp unlabored, no other symptoms.

## 2017-07-28 NOTE — ED Provider Notes (Signed)
[facility] Provider [facility]   CSN: [id number] Arrival date & time: 07/28/17  1717     History   Chief Complaint Chief Complaint  Patient presents with  . Shortness of Breath    HPI Theresa Cantrell is a 41 y.o. female with past medical history of anxiety, hepatitis C, arthritis, presenting to the ED via EMS from work after episode of shortness of breath. Patient states she began smelling a strong citrus smell and began having a panic attack with fast breathing. She states at that time she did not have any lip or tongue swelling, difficulty swallowing or breathing, chest or throat tightness. She states those symptoms resolved, however she began developing itchy hives on her arms and trunk. She denies any feelings of lip or tongue swelling, difficulty breathing or swallowing, chest pain or shortness of breath in the ED. Denies abdominal pain, nausea, vomiting. No recent medication changes or antibiotics, no new personal products or new foods tried.   The history is provided by the patient.    Past Medical History:  Diagnosis Date  . Anxiety   . Arthritis   . Hepatitis C     Patient Active Problem List   Diagnosis Date Noted  . Neuropathy 02/07/2014  . Hot flashes 02/07/2014  . Weight loss 02/07/2014  . Anxiety     Past Surgical History:  Procedure Laterality Date  . ABDOMINAL HYSTERECTOMY    . CHOLECYSTECTOMY    . DILATION AND CURETTAGE OF UTERUS    . FOOT SURGERY Bilateral    two pins in each foot  . KNEE SURGERY      OB History    Gravida Para Term Preterm AB Living   2 1 1  0 1 1   SAB TAB Ectopic Multiple Live Births   0 1 0 0         Home Medications    Prior to Admission medications   Medication Sig Start Date End Date Taking? Authorizing Provider  ALPRAZolam Duanne Moron) 0.5 MG tablet Take 1 tablet (0.5 mg total) by mouth 2 (two) times daily as needed for anxiety. 08/22/15   Donnamae Jude, MD  cyclobenzaprine (FLEXERIL) 10 MG tablet Take 1 tablet (10  mg total) by mouth 2 (two) times daily as needed for muscle spasms. 02/09/17   Charlann Lange, PA-C  gabapentin (NEURONTIN) 600 MG tablet Take 600 mg by mouth 2 (two) times daily.    [provider]  HYDROcodone-acetaminophen (NORCO/VICODIN) 5-325 MG per tablet Take 2 tablets by mouth every 6 (six) hours as needed. 02/05/15   Montine Circle, PA-C  ibuprofen (ADVIL,MOTRIN) 800 MG tablet Take 1 tablet (800 mg total) by mouth 3 (three) times daily. 02/09/17   Charlann Lange, PA-C  meloxicam (MOBIC) 15 MG tablet Take 15 mg by mouth daily.    [provider]  Multiple Vitamin (MULTI VITAMIN PO) Take by mouth.    [provider]  traZODone (DESYREL) 50 MG tablet Take 25-50 mg by mouth at bedtime as needed for sleep.  01/10/15   [provider]    Family History Family History  Problem Relation Age of Onset  . Hypertension Mother   . Cancer Mother        breast  . Anxiety disorder Mother   . Hypertension Father   . Schizophrenia Father   . Bipolar disorder Father   . Anxiety disorder Father     Social History Social History  Substance Use Topics  . Smoking status: Current  Some Day Smoker    Packs/day: 0.25    Types: Cigarettes  . Smokeless tobacco: Never Used  . Alcohol use 1.2 oz/week    1 Glasses of wine, 1 Cans of beer per week     Allergies   Patient has no active allergies.   Review of Systems Review of Systems  Constitutional: Negative for fever.  HENT: Negative for drooling, facial swelling, sore throat, trouble swallowing and voice change.   Respiratory: Positive for shortness of breath (resolved). Negative for chest tightness and stridor.   Cardiovascular: Negative for chest pain.  Gastrointestinal: Negative for abdominal pain, nausea and vomiting.  Skin: Positive for rash.  Neurological: Negative for headaches.  All other systems reviewed and are negative.    Physical Exam Updated Vital Signs BP (!) 145/75 (BP Location: Left  Arm)   Pulse 71   Temp 98.2 F (36.8 C) (Oral)   Resp 16   Ht 5\' 5"  (1.651 m)   Wt 74.8 kg (165 lb)   SpO2 100%   BMI 27.46 kg/m   Physical Exam  Constitutional: She appears well-developed and well-nourished. No distress.  Well-appearing, tolerating secretions, no increased work of breathing. No facial edema.  HENT:  Head: Normocephalic and atraumatic.  Mouth/Throat: Uvula is midline and oropharynx is clear and moist. No trismus in the jaw. No uvula swelling. No posterior oropharyngeal edema.  Eyes: Pupils are equal, round, and reactive to light. Conjunctivae and EOM are normal.  Neck: Normal range of motion. Neck supple. No tracheal deviation present.  Cardiovascular: Normal rate, regular rhythm, normal heart sounds and intact distal pulses.  Exam reveals no friction rub.   No murmur heard. Pulmonary/Chest: Effort normal and breath sounds normal. No stridor. No respiratory distress. She has no wheezes. She has no rales.  Abdominal: Soft. Bowel sounds are normal. She exhibits no distension. There is no tenderness. There is no guarding.  Lymphadenopathy:    She has no cervical adenopathy.  Neurological: She is alert.  Skin: Skin is warm and dry.  Multiple erythematous wheals to bilateral upper extremities, trunk and right face.  Psychiatric: She has a normal mood and affect. Her behavior is normal.  Nursing note and vitals reviewed.    ED Treatments / Results  Labs (all labs ordered are listed, but only abnormal results are displayed) Labs Reviewed - No data to display  EKG  EKG Interpretation None       Radiology No results found.  Procedures Procedures (including critical care time)  Medications Ordered in ED Medications  predniSONE (DELTASONE) tablet 60 mg (60 mg Oral Given 07/28/17 1947)  diphenhydrAMINE (BENADRYL) capsule 25 mg (25 mg Oral Given 07/28/17 1947)  famotidine (PEPCID) tablet 20 mg (20 mg Oral Given 07/28/17 1947)     Initial Impression /  Assessment and Plan / ED Course  I have reviewed the triage vital signs and the nursing notes.  Pertinent labs & imaging results that were available during my care of the patient were reviewed by me and considered in my medical decision making (see chart for details).    Patient presenting via EMS for shortness of breath/panic attack after exposure to "citrus smelling" chemical at her work. Symptoms of shortness of breath/anxiety resolved prior to evaluation. Denies occurrence of lip or tongue swelling, throat tightness, chest pain, abdominal pain, vomiting. Patient with itchy erythematous wheals to upper extremities and trunk. Patient given PO Benadryl, Pepcid, and 60 mg of prednisone in ED. Patient re-evaluated prior to dc, is hemodynamically  stable, in no respiratory distress, and denies the feeling of throat closing. States itching is nearly resolved. Pt has been advised to take OTC benadryl & return to the ED if they have a mod-severe allergic rxn (s/s including throat closing, difficulty breathing, swelling of lips face or tongue). Pt is to follow up with their PCP. Pt is agreeable with plan & verbalizes understanding.  Discussed results, findings, treatment and follow up. Patient advised of return precautions. Patient verbalized understanding and agreed with plan.   Final Clinical Impressions(s) / ED Diagnoses   Final diagnoses:  Allergic reaction, initial encounter    New Prescriptions New Prescriptions   No medications on file     Russo, [place] N, PA-C 07/28/17 2048    Gareth Morgan, MD 07/29/17 [phone]

## 2017-08-21 ENCOUNTER — Encounter (HOSPITAL_COMMUNITY): Payer: Self-pay

## 2017-08-21 ENCOUNTER — Emergency Department (HOSPITAL_COMMUNITY)
Admission: EM | Admit: 2017-08-21 | Discharge: 2017-08-21 | Disposition: A | Discharge status: Home / Self Care | Payer: Managed Care, Other (non HMO) | Attending: Emergency Medicine | Admitting: Emergency Medicine

## 2017-08-21 ENCOUNTER — Emergency Department (HOSPITAL_COMMUNITY): Payer: Managed Care, Other (non HMO)

## 2017-08-21 DIAGNOSIS — M25562 Pain in left knee: Secondary | ICD-10-CM | POA: Insufficient documentation

## 2017-08-21 DIAGNOSIS — F17210 Nicotine dependence, cigarettes, uncomplicated: Secondary | ICD-10-CM | POA: Diagnosis not present

## 2017-08-21 MED ORDER — MELOXICAM 15 MG PO TABS: 15.0000 mg | ORAL_TABLET | Freq: Every day | ORAL | 0 refills | Status: DC | PRN

## 2017-08-21 MED ORDER — HYDROCODONE-ACETAMINOPHEN 5-325 MG PO TABS
1.0000 | ORAL_TABLET | Freq: Once | ORAL | Status: AC
  Administered 2017-08-21: 1 via ORAL
  Filled 2017-08-21: qty 1

## 2017-08-21 NOTE — Discharge Instructions (Signed)
Please follow up with your orthopedist

## 2017-08-21 NOTE — ED Notes (Signed)
Ortho is on their way.

## 2017-08-21 NOTE — ED Provider Notes (Signed)
[facility] EMERGENCY DEPARTMENT Provider Note   CSN: [id number] Arrival date & time: 08/21/17  1331     History   Chief Complaint Chief Complaint  Patient presents with  . Leg Pain    HPI Theresa Cantrell is a 41 y.o. female.  HPI   Patient with hx two left knee surgeries, one for dislocation in 2003 with pin placement, p/w atraumatic left knee pain that started suddenly while she was driving at noon today.  The pain is located in the medial knee, feels like the "pin moved" or the knee "shifted."  The pain is 10/10, has been constant since.  Notes some tingling in her lower leg.  Prior to getting in the car she was having no more than her typical arthritis pain.  Denies any injury.  Denies leg swelling.    Orthopedist - [facility].   Past Medical History:  Diagnosis Date  . Anxiety   . Arthritis   . Hepatitis C     Patient Active Problem List   Diagnosis Date Noted  . Neuropathy 02/07/2014  . Hot flashes 02/07/2014  . Weight loss 02/07/2014  . Anxiety     Past Surgical History:  Procedure Laterality Date  . ABDOMINAL HYSTERECTOMY    . CHOLECYSTECTOMY    . DILATION AND CURETTAGE OF UTERUS    . FOOT SURGERY Bilateral    two pins in each foot  . KNEE SURGERY      OB History    Gravida Para Term Preterm AB Living   2 1 1  0 1 1   SAB TAB Ectopic Multiple Live Births   0 1 0 0         Home Medications    Prior to Admission medications   Medication Sig Start Date End Date Taking? Authorizing Provider  ALPRAZolam Duanne Moron) 0.5 MG tablet Take 1 tablet (0.5 mg total) by mouth 2 (two) times daily as needed for anxiety. 08/22/15   Donnamae Jude, MD  meloxicam (MOBIC) 15 MG tablet Take 1 tablet (15 mg total) by mouth daily as needed for pain. 08/21/17   Clayton Bibles, PA-C  Multiple Vitamin (MULTI VITAMIN PO) Take by mouth.    [provider]  traZODone (DESYREL) 50 MG tablet Take 25-50 mg by mouth at bedtime as needed for sleep.   01/10/15   [provider]    Family History Family History  Problem Relation Age of Onset  . Hypertension Mother   . Cancer Mother        breast  . Anxiety disorder Mother   . Hypertension Father   . Schizophrenia Father   . Bipolar disorder Father   . Anxiety disorder Father     Social History Social History  Substance Use Topics  . Smoking status: Current Some Day Smoker    Packs/day: 0.25    Types: Cigarettes  . Smokeless tobacco: Never Used  . Alcohol use 1.2 oz/week    1 Glasses of wine, 1 Cans of beer per week     Allergies   Lamotrigine   Review of Systems Review of Systems  Constitutional: Negative for chills and fever.  Musculoskeletal: Positive for arthralgias.  Neurological: Negative for weakness and numbness.     Physical Exam Updated Vital Signs BP 140/90 (BP Location: Left Arm)   Pulse 90   Temp 98 F (36.7 C) (Oral)   Resp 18   SpO2 100%   Physical Exam  Constitutional: She appears well-developed  and well-nourished. No distress.  HENT:  Head: Normocephalic and atraumatic.  Neck: Neck supple.  Pulmonary/Chest: Effort normal.  Musculoskeletal:  Left anterior knee with remote surgical scars.  No erythema or warmth.   Tenderness medial to patella.   Decreased active ROM due to pain.  Passive ROM is stiff but not limited.  Calf without edema or tenderness.  Distal pulses and sensation intact.    Neurological: She is alert.  Skin: She is not diaphoretic.  Nursing note and vitals reviewed.    ED Treatments / Results  Labs (all labs ordered are listed, but only abnormal results are displayed) Labs Reviewed - No data to display  EKG  EKG Interpretation None       Radiology Dg Knee Complete 4 Views Left  Result Date: 08/21/2017 CLINICAL DATA:  Left knee pain EXAM: LEFT KNEE - COMPLETE 4+ VIEW COMPARISON:  02/05/2015 FINDINGS: Normal alignment. No fracture or joint effusion. Degenerative change in the patellofemoral joint.  Mild degenerative spurring laterally and medially. Screw in the tibial tuberosity extending through the posterior cortex the tibia is unchanged from the prior study. No evidence of loosening. IMPRESSION: Degenerative changes as above. No acute abnormality and no change from the prior study. Electronically Signed   By: Franchot Gallo M.D.   On: 08/21/2017 16:29    Procedures Procedures (including critical care time)  Medications Ordered in ED Medications  HYDROcodone-acetaminophen (NORCO/VICODIN) 5-325 MG per tablet 1 tablet (1 tablet Oral Given 08/21/17 1543)     Initial Impression / Assessment and Plan / ED Course  I have reviewed the triage vital signs and the nursing notes.  Pertinent labs & imaging results that were available during my care of the patient were reviewed by me and considered in my medical decision making (see chart for details).     Afebrile, nontoxic patient with left knee pain, atraumatic, while driving.  Does have hardware.  Xray negative.    D/C home with mobic, knee sleeve, crutches, ortho follow up.  Discussed result, findings, treatment, and follow up  with patient.  Pt given return precautions.  Pt verbalizes understanding and agrees with plan.       Final Clinical Impressions(s) / ED Diagnoses   Final diagnoses:  Acute pain of left knee    New Prescriptions Current Discharge Medication List       Clayton Bibles, Hershal Coria 08/21/17 [place], PA-C 08/21/17 1717    Malvin Johns, MD 08/22/17 [phone]

## 2017-08-21 NOTE — ED Triage Notes (Signed)
Pt presents with L knee pain that occurred while driving today.  Pt reports h/o patella dislocation, with last surgery 2016.  Pt able to ambulate in triage.

## 2017-08-21 NOTE — ED Notes (Signed)
Paged ortho 

## 2017-08-21 NOTE — Progress Notes (Signed)
Orthopedic Tech Progress Note Patient Details:  Theresa Cantrell 05/15/1976 [id number]  Ortho Devices Type of Ortho Device: Crutches, Knee Sleeve Ortho Device/Splint Interventions: Application   [name] 08/21/2017, 5:15 PM

## 2018-02-22 ENCOUNTER — Other Ambulatory Visit: Payer: Self-pay | Admitting: Family Medicine

## 2018-02-22 DIAGNOSIS — N6310 Unspecified lump in the right breast, unspecified quadrant: Secondary | ICD-10-CM

## 2018-02-28 ENCOUNTER — Other Ambulatory Visit: Payer: Self-pay

## 2018-03-08 IMAGING — CR DG KNEE COMPLETE 4+V*L*
4 series · 4 of 4 positions shown · non-contrast
Comparison: 02/05/2015

CLINICAL DATA: Left knee pain

EXAM:
LEFT KNEE - COMPLETE 4+ VIEW

[knee ap]
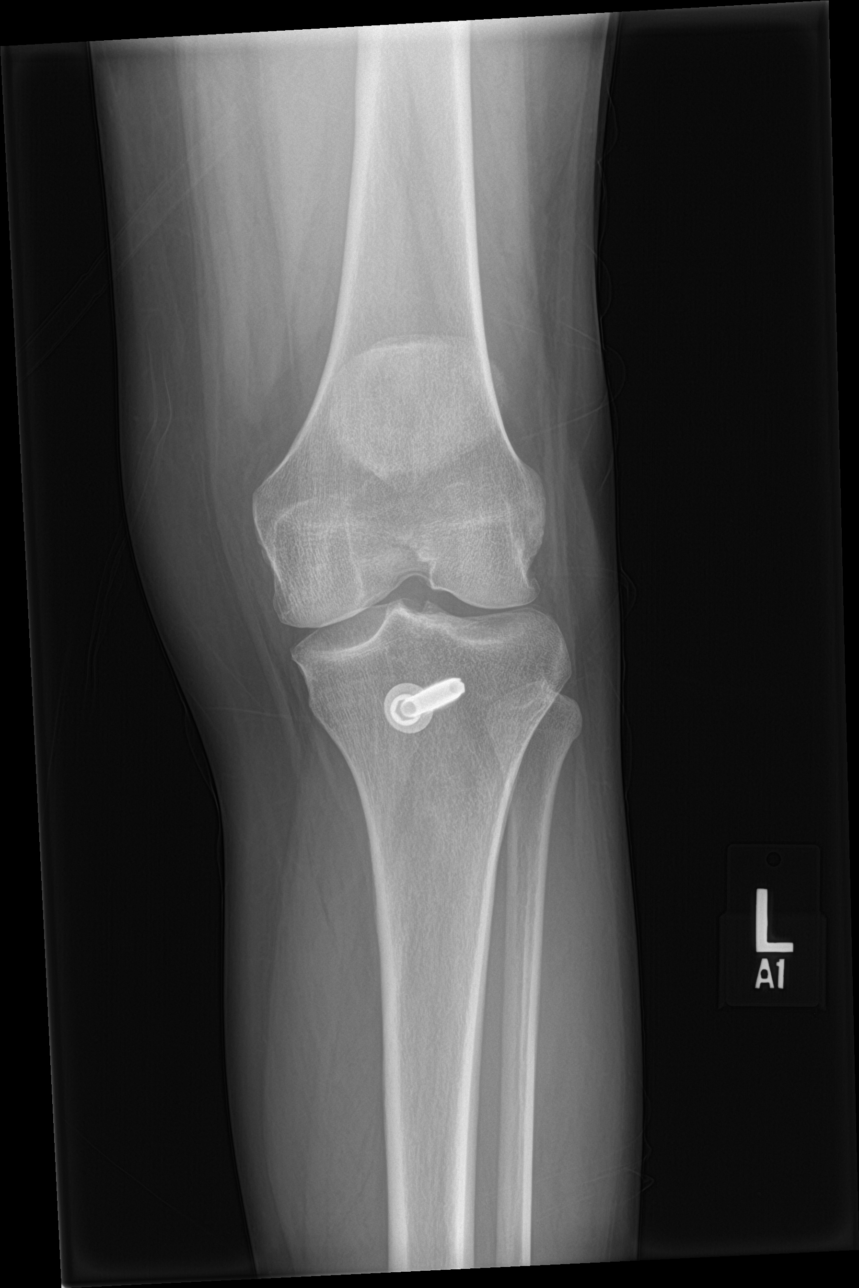

[knee lat]
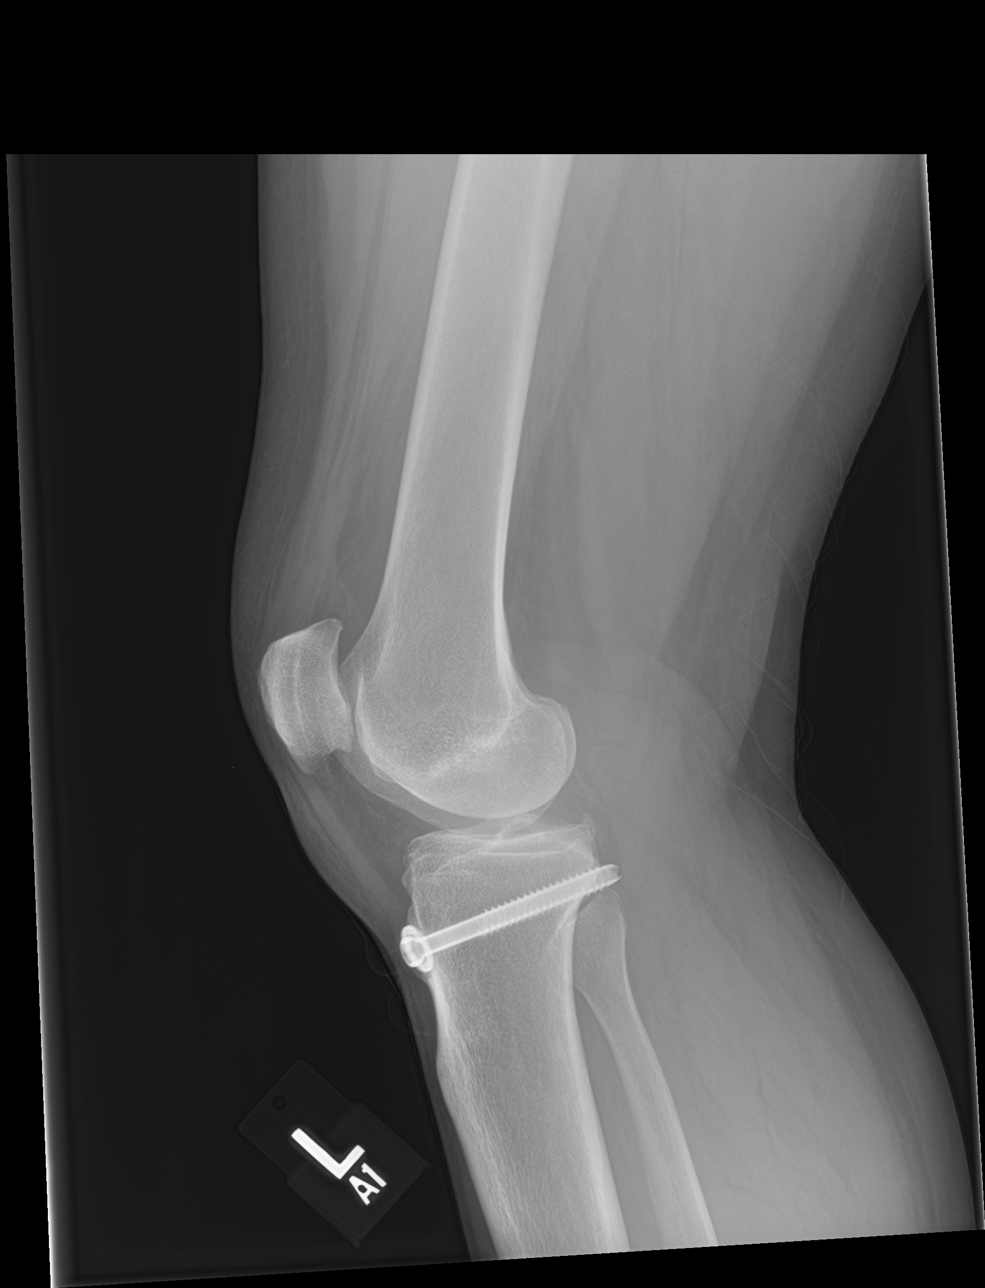

[knee obl (1 of 2)]
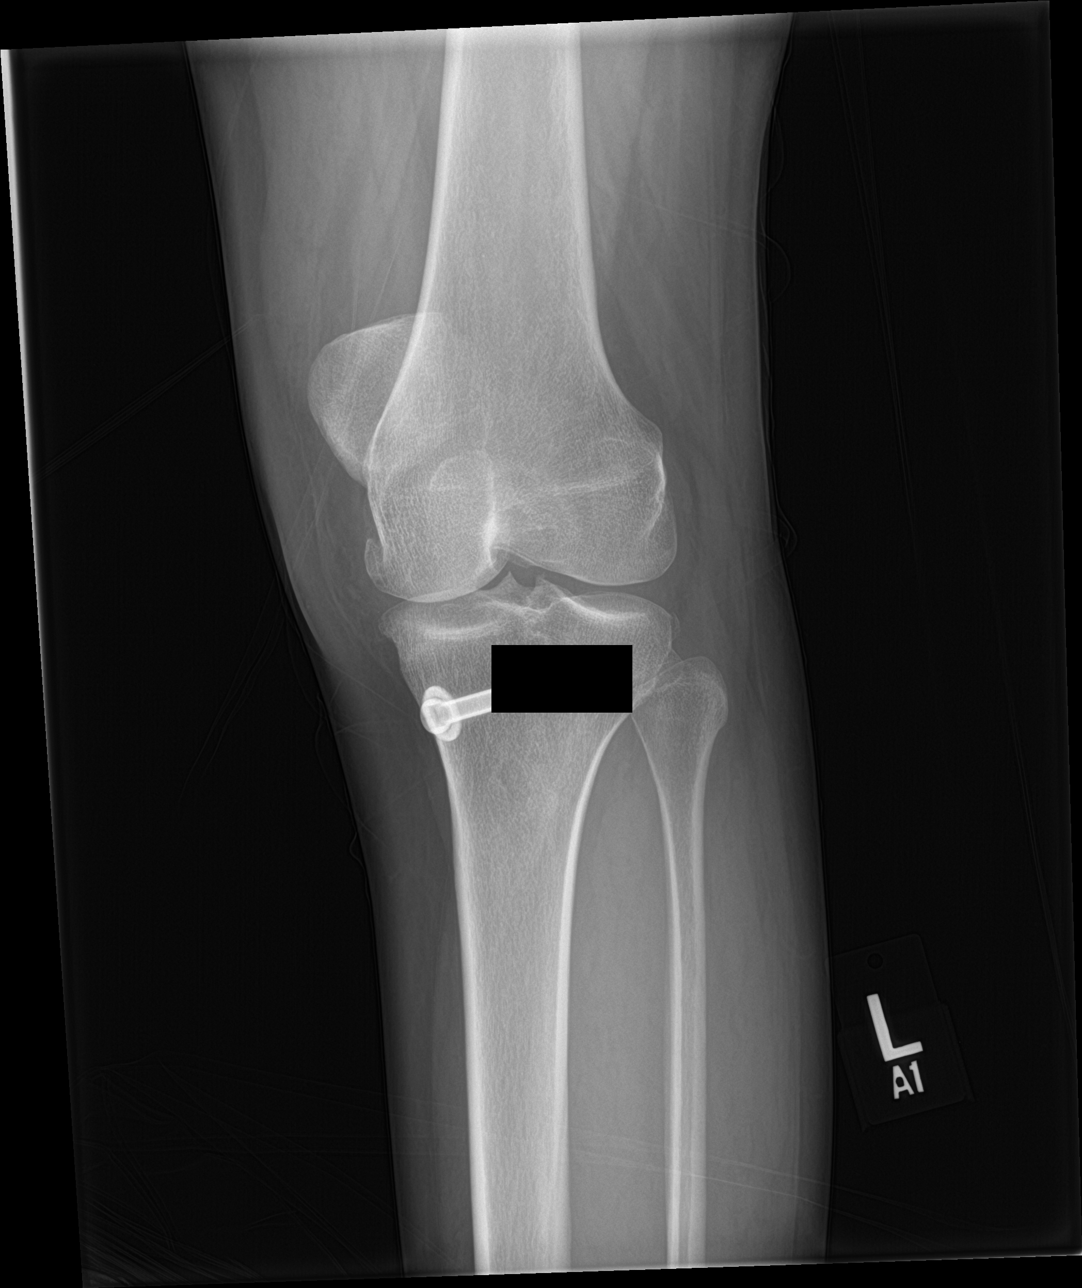

[knee obl (2 of 2)]
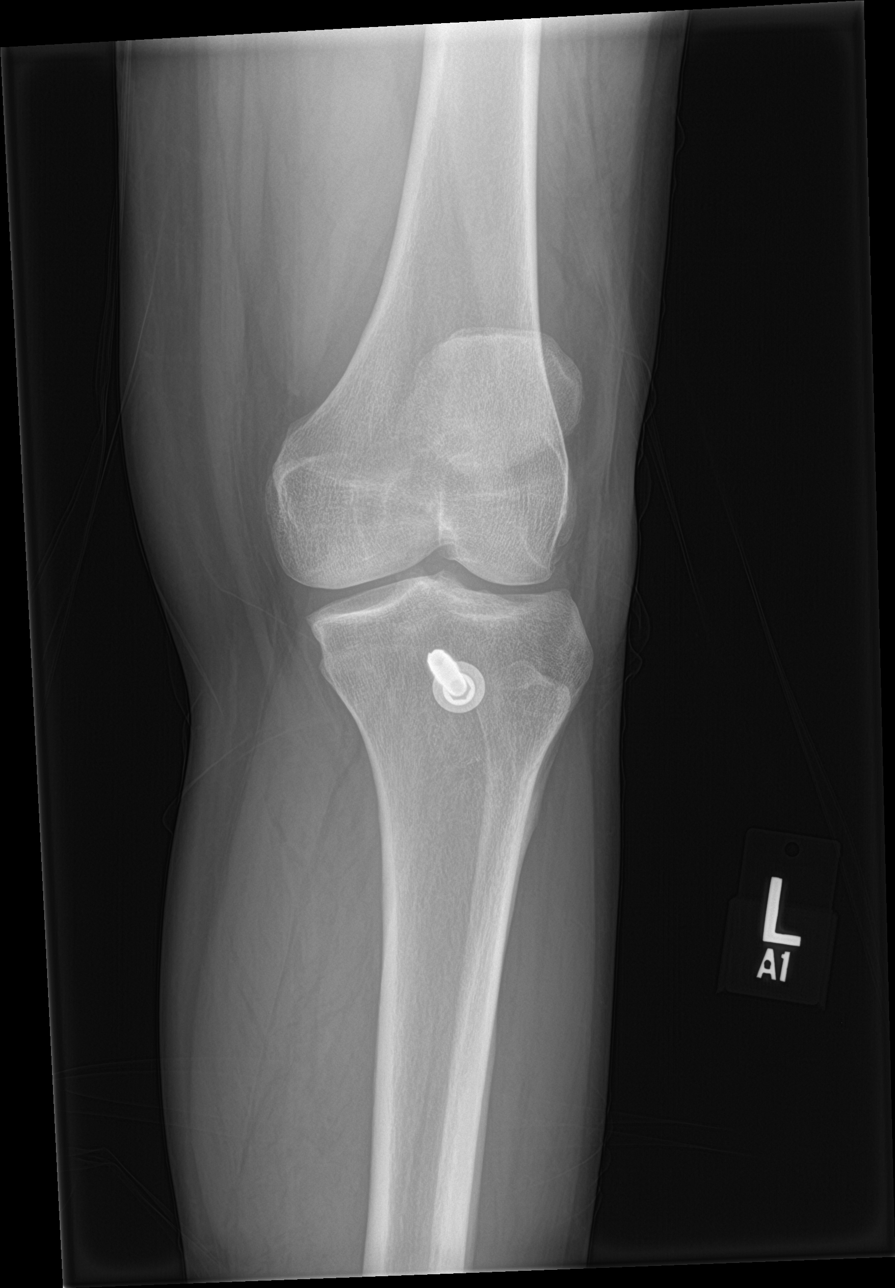

[4 of 4 positions shown; findings below may reference images not displayed]

FINDINGS: Normal alignment. No fracture or joint effusion. Degenerative change
in the patellofemoral joint. Mild degenerative spurring laterally
and medially. Screw in the tibial tuberosity extending through the
posterior cortex the tibia is unchanged from the prior study. No
evidence of loosening.
IMPRESSION: Degenerative changes as above. No acute abnormality and no change
from the prior study.

## 2018-03-16 ENCOUNTER — Ambulatory Visit
Admission: RE | Admit: 2018-03-16 | Discharge: 2018-03-16 | Disposition: A | Discharge status: Home / Self Care | Payer: Managed Care, Other (non HMO) | Source: Ambulatory Visit | Attending: Family Medicine | Admitting: Family Medicine

## 2018-03-16 DIAGNOSIS — N6310 Unspecified lump in the right breast, unspecified quadrant: Secondary | ICD-10-CM

## 2018-03-24 ENCOUNTER — Ambulatory Visit: Payer: Self-pay | Admitting: Family Medicine

## 2018-03-31 ENCOUNTER — Encounter: Payer: Self-pay | Admitting: Family Medicine

## 2018-03-31 ENCOUNTER — Telehealth: Payer: Self-pay

## 2018-03-31 ENCOUNTER — Ambulatory Visit (INDEPENDENT_AMBULATORY_CARE_PROVIDER_SITE_OTHER): Payer: Managed Care, Other (non HMO) | Admitting: Family Medicine

## 2018-03-31 VITALS — BP 111/78 | HR 72 | Wt 200.0 lb

## 2018-03-31 DIAGNOSIS — Z1151 Encounter for screening for human papillomavirus (HPV): Secondary | ICD-10-CM

## 2018-03-31 DIAGNOSIS — Z01419 Encounter for gynecological examination (general) (routine) without abnormal findings: Secondary | ICD-10-CM

## 2018-03-31 DIAGNOSIS — Z124 Encounter for screening for malignant neoplasm of cervix: Secondary | ICD-10-CM | POA: Diagnosis not present

## 2018-03-31 DIAGNOSIS — Z113 Encounter for screening for infections with a predominantly sexual mode of transmission: Secondary | ICD-10-CM

## 2018-03-31 DIAGNOSIS — R87810 Cervical high risk human papillomavirus (HPV) DNA test positive: Secondary | ICD-10-CM

## 2018-03-31 NOTE — Progress Notes (Signed)
Subjective:     Theresa Cantrell is a 42 y.o. female and is here for a comprehensive physical exam. The patient reports problems - recent knee replacement and needs a second one done. s/p hyst in her 69s, no cycle and not longer has cervix..  Social History   Socioeconomic History  . Marital status: Single    Spouse name: Not on file  . Number of children: Not on file  . Years of education: Not on file  . Highest education level: Not on file  Occupational History  . Not on file  Social Needs  . Financial resource strain: Not on file  . Food insecurity:    Worry: Not on file    Inability: Not on file  . Transportation needs:    Medical: Not on file    Non-medical: Not on file  Tobacco Use  . Smoking status: Current Some Day Smoker    Packs/day: 0.25    Types: Cigarettes  . Smokeless tobacco: Never Used  Substance and Sexual Activity  . Alcohol use: Yes    Alcohol/week: 1.2 oz    Types: 1 Glasses of wine, 1 Cans of beer per week  . Drug use: No  . Sexual activity: Yes    Birth control/protection: Surgical  Lifestyle  . Physical activity:    Days per week: Not on file    Minutes per session: Not on file  . Stress: Not on file  Relationships  . Social connections:    Talks on phone: Not on file    Gets together: Not on file    Attends religious service: Not on file    Active member of club or organization: Not on file    Attends meetings of clubs or organizations: Not on file    Relationship status: Not on file  . Intimate partner violence:    Fear of current or ex partner: Not on file    Emotionally abused: Not on file    Physically abused: Not on file    Forced sexual activity: Not on file  Other Topics Concern  . Not on file  Social History Narrative  . Not on file   Health Maintenance  Topic Date Due  . TETANUS/TDAP  12/21/1992  . INFLUENZA VACCINE  06/02/2018  . PAP SMEAR  08/21/2018  . HIV Screening  Completed    The following portions of the  patient's history were reviewed and updated as appropriate: allergies, current medications, past family history, past medical history, past social history, past surgical history and problem list.  Review of Systems Pertinent items noted in HPI and remainder of comprehensive ROS otherwise negative.   Objective:    BP 111/78   Pulse 72   Wt 200 lb (90.7 kg)   BMI 33.28 kg/m  General appearance: alert, cooperative and appears stated age Head: Normocephalic, without obvious abnormality, atraumatic Neck: no adenopathy, supple, symmetrical, trachea midline and thyroid not enlarged, symmetric, no tenderness/mass/nodules Lungs: clear to auscultation bilaterally Breasts: normal appearance, no masses or tenderness Heart: regular rate and rhythm, S1, S2 normal, no murmur, click, rub or gallop Abdomen: soft, non-tender; bowel sounds normal; no masses,  no organomegaly Pelvic: external genitalia normal, no adnexal masses or tenderness, uterus surgically absent and vagina normal without discharge Extremities: extremities normal, atraumatic, no cyanosis or edema large incision on left knee, large brace in place Pulses: 2+ and symmetric Skin: Skin color, texture, turgor normal. No rashes or lesions Lymph nodes: Cervical, supraclavicular, and axillary nodes  normal. Neurologic: Grossly normal    Assessment:    Healthy female exam.      Plan:  Well woman exam with routine gynecological exam - Plan: CBC, TSH, Hemoglobin A1c, Comprehensive metabolic panel, Lipid panel, CANCELED: Cytology - PAP  Screen for STD (sexually transmitted disease) - Plan: Cytology - PAP, HIV antibody, RPR, Hepatitis B surface antigen, Hepatitis C antibody Return in 1 year (on 04/01/2019).  See After Visit Summary for Counseling Recommendations

## 2018-03-31 NOTE — Progress Notes (Signed)
Mammogram 03/16/2018 Last pap 08/2015- normal

## 2018-03-31 NOTE — Addendum Note (Signed)
Addended by: Phillip Heal, DEMETRICE A on: 03/31/2018 04:28 PM   Modules accepted: Orders

## 2018-03-31 NOTE — Patient Instructions (Signed)
Preventive Care 18-39 Years, Female Preventive care refers to lifestyle choices and visits with your health care provider that can promote health and wellness. What does preventive care include?  A yearly physical exam. This is also called an annual well check.  Dental exams once or twice a year.  Routine eye exams. Ask your health care provider how often you should have your eyes checked.  Personal lifestyle choices, including: ? Daily care of your teeth and gums. ? Regular physical activity. ? Eating a healthy diet. ? Avoiding tobacco and drug use. ? Limiting alcohol use. ? Practicing safe sex. ? Taking vitamin and mineral supplements as recommended by your health care provider. What happens during an annual well check? The services and screenings done by your health care provider during your annual well check will depend on your age, overall health, lifestyle risk factors, and family history of disease. Counseling Your health care provider may ask you questions about your:  Alcohol use.  Tobacco use.  Drug use.  Emotional well-being.  Home and relationship well-being.  Sexual activity.  Eating habits.  Work and work Statistician.  Method of birth control.  Menstrual cycle.  Pregnancy history.  Screening You may have the following tests or measurements:  Height, weight, and BMI.  Diabetes screening. This is done by checking your blood sugar (glucose) after you have not eaten for a while (fasting).  Blood pressure.  Lipid and cholesterol levels. These may be checked every 5 years starting at age 38.  Skin check.  Hepatitis C blood test.  Hepatitis B blood test.  Sexually transmitted disease (STD) testing.  BRCA-related cancer screening. This may be done if you have a family history of breast, ovarian, tubal, or peritoneal cancers.  Pelvic exam and Pap test. This may be done every 3 years starting at age 38. Starting at age 30, this may be done  every 5 years if you have a Pap test in combination with an HPV test.  Discuss your test results, treatment options, and if necessary, the need for more tests with your health care provider. Vaccines Your health care provider may recommend certain vaccines, such as:  Influenza vaccine. This is recommended every year.  Tetanus, diphtheria, and acellular pertussis (Tdap, Td) vaccine. You may need a Td booster every 10 years.  Varicella vaccine. You may need this if you have not been vaccinated.  HPV vaccine. If you are 39 or younger, you may need three doses over 6 months.  Measles, mumps, and rubella (MMR) vaccine. You may need at least one dose of MMR. You may also need a second dose.  Pneumococcal 13-valent conjugate (PCV13) vaccine. You may need this if you have certain conditions and were not previously vaccinated.  Pneumococcal polysaccharide (PPSV23) vaccine. You may need one or two doses if you smoke cigarettes or if you have certain conditions.  Meningococcal vaccine. One dose is recommended if you are age 68-21 years and a first-year college student living in a residence hall, or if you have one of several medical conditions. You may also need additional booster doses.  Hepatitis A vaccine. You may need this if you have certain conditions or if you travel or work in places where you may be exposed to hepatitis A.  Hepatitis B vaccine. You may need this if you have certain conditions or if you travel or work in places where you may be exposed to hepatitis B.  Haemophilus influenzae type b (Hib) vaccine. You may need this  if you have certain risk factors.  Talk to your health care provider about which screenings and vaccines you need and how often you need them. This information is not intended to replace advice given to you by your health care provider. Make sure you discuss any questions you have with your health care provider. Document Released: 12/15/2001 Document Revised:  07/08/2016 Document Reviewed: 08/20/2015 Elsevier Interactive Patient Education  2018 Elsevier Inc.  

## 2018-03-31 NOTE — Telephone Encounter (Signed)
Patient is requesting we check her [place] level because she is having hot flashes. Order has been placed.

## 2018-04-01 ENCOUNTER — Telehealth: Payer: Self-pay

## 2018-04-01 LAB — HEPATITIS C ANTIBODY: Hep C Virus Ab: <0.1 s/co ratio (ref 0.0–0.9)

## 2018-04-01 LAB — CBC
Hematocrit: 38.3 % (ref 34.0–46.6)
Hemoglobin: 12.7 g/dL (ref 11.1–15.9)
MCH: 30.7 pg (ref 26.6–33.0)
MCHC: 33.2 g/dL (ref 31.5–35.7)
MCV: 93 fL (ref 79–97)
Platelets: 251 10*3/uL (ref 150–450)
RBC: 4.14 x10E6/uL (ref 3.77–5.28)
RDW: 13.4 % (ref 12.3–15.4)
WBC: 5.3 10*3/uL (ref 3.4–10.8)

## 2018-04-01 LAB — HIV ANTIBODY (ROUTINE TESTING W REFLEX): HIV Screen 4th Generation wRfx: NONREACTIVE

## 2018-04-01 LAB — COMPREHENSIVE METABOLIC PANEL
ALT: 10 IU/L (ref 0–32)
AST: 12 IU/L (ref 0–40)
Albumin/Globulin Ratio: 1.8 (ref 1.2–2.2)
Albumin: 4.2 g/dL (ref 3.5–5.5)
Alkaline Phosphatase: 86 IU/L (ref 39–117)
BUN/Creatinine Ratio: 16 (ref 9–23)
BUN: 11 mg/dL (ref 6–24)
Bilirubin Total: <0.2 mg/dL (ref 0.0–1.2)
CO2: 22 mmol/L (ref 20–29)
CREATININE: 0.67 mg/dL (ref 0.57–1.00)
Calcium: 9.2 mg/dL (ref 8.7–10.2)
Chloride: 106 mmol/L (ref 96–106)
GFR calc Af Amer: 124 mL/min/{1.73_m2} (ref >59)
GFR, EST NON AFRICAN AMERICAN: 107 mL/min/{1.73_m2} (ref >59)
GLUCOSE: 96 mg/dL (ref 65–99)
Globulin, Total: 2.3 g/dL (ref 1.5–4.5)
POTASSIUM: 4.4 mmol/L (ref 3.5–5.2)
Sodium: 141 mmol/L (ref 134–144)
Total Protein: 6.5 g/dL (ref 6.0–8.5)

## 2018-04-01 LAB — LIPID PANEL
CHOL/HDL RATIO: 2.4 ratio (ref 0.0–4.4)
Cholesterol, Total: 144 mg/dL (ref 100–199)
HDL: 61 mg/dL (ref >39)
LDL Calculated: 63 mg/dL (ref 0–99)
TRIGLYCERIDES: 101 mg/dL (ref 0–149)
VLDL CHOLESTEROL CAL: 20 mg/dL (ref 5–40)

## 2018-04-01 LAB — HEMOGLOBIN A1C
Est. average glucose Bld gHb Est-mCnc: 111 mg/dL
HEMOGLOBIN A1C: 5.5 % (ref 4.8–5.6)

## 2018-04-01 LAB — TSH: TSH: 0.484 u[IU]/mL (ref 0.450–4.500)

## 2018-04-01 LAB — FOLLICLE STIMULATING HORMONE: FSH: <0.2 m[IU]/mL

## 2018-04-01 LAB — RPR: RPR Ser Ql: NONREACTIVE

## 2018-04-01 LAB — HEPATITIS B SURFACE ANTIGEN: Hepatitis B Surface Ag: NEGATIVE

## 2018-04-01 NOTE — Telephone Encounter (Signed)
Call patient to inform her of test results. Patient advised understanding at this time

## 2018-04-01 NOTE — Telephone Encounter (Signed)
-----   Message from Donnamae Jude, MD sent at 04/01/2018  9:16 AM EDT ----- Labs are normal--please inform pt.

## 2018-04-04 LAB — CYTOLOGY - PAP
CHLAMYDIA, DNA PROBE: NEGATIVE
Diagnosis: UNDETERMINED — AB
HPV 16/18/45 GENOTYPING: NEGATIVE
HPV: DETECTED — AB
Neisseria Gonorrhea: NEGATIVE
TRICH (WINDOWPATH): NEGATIVE

## 2018-04-12 ENCOUNTER — Ambulatory Visit (HOSPITAL_COMMUNITY): Payer: Managed Care, Other (non HMO) | Admitting: Psychiatry

## 2018-04-19 ENCOUNTER — Encounter: Payer: Self-pay | Admitting: Family Medicine

## 2018-05-12 ENCOUNTER — Encounter: Payer: Self-pay | Admitting: Family Medicine

## 2018-05-12 ENCOUNTER — Ambulatory Visit (INDEPENDENT_AMBULATORY_CARE_PROVIDER_SITE_OTHER): Payer: Managed Care, Other (non HMO) | Admitting: Family Medicine

## 2018-05-12 VITALS — BP 134/91 | HR 71 | Wt 201.0 lb

## 2018-05-12 DIAGNOSIS — R87811 Vaginal high risk human papillomavirus (HPV) DNA test positive: Secondary | ICD-10-CM

## 2018-05-12 DIAGNOSIS — R87620 Atypical squamous cells of undetermined significance on cytologic smear of vagina (ASC-US): Secondary | ICD-10-CM | POA: Diagnosis not present

## 2018-05-12 NOTE — Patient Instructions (Signed)
Colposcopy, Care After  This sheet gives you information about how to care for yourself after your procedure. Your doctor may also give you more specific instructions. If you have problems or questions, contact your doctor.  What can I expect after the procedure?  If you did not have a tissue sample removed (did not have a biopsy), you may only have some spotting for a few days. You can go back to your normal activities.  If you had a tissue sample removed, it is common to have:  · Soreness and pain. This may last for a few days.  · Light-headedness.  · Mild bleeding from your vagina or dark-colored, grainy discharge from your vagina. This may last for a few days. You may need to wear a sanitary pad.  · Spotting for at least 48 hours after the procedure.    Follow these instructions at home:  · Take over-the-counter and prescription medicines only as told by your doctor. Ask your doctor what medicines you can start taking again. This is very important if you take blood-thinning medicine.  · Do not drive or use heavy machinery while taking prescription pain medicine.  · For 3 days, or as long as your doctor tells you, avoid:  ? Douching.  ? Using tampons.  ? Having sex.  · If you use birth control (contraception), keep using it.  · Limit activity for the first day after the procedure. Ask your doctor what activities are safe for you.  · It is up to you to get the results of your procedure. Ask your doctor when your results will be ready.  · Keep all follow-up visits as told by your doctor. This is important.  Contact a doctor if:  · You get a skin rash.  Get help right away if:  · You are bleeding a lot from your vagina. It is a lot of bleeding if you are using more than one pad an hour for 2 hours in a row.  · You have clumps of blood (blood clots) coming from your vagina.  · You have a fever.  · You have chills  · You have pain in your lower belly (pelvic area).  · You have signs of infection, such as vaginal  discharge that is:  ? Different than usual.  ? Yellow.  ? Bad-smelling.  · You have very pain or cramps in your lower belly that do not get better with medicine.  · You feel light-headed.  · You feel dizzy.  · You pass out (faint).  Summary  · If you did not have a tissue sample removed (did not have a biopsy), you may only have some spotting for a few days. You can go back to your normal activities.  · If you had a tissue sample removed, it is common to have mild pain and spotting for 48 hours.  · For 3 days, or as long as your doctor tells you, avoid douching, using tampons and having sex.  · Get help right away if you have bleeding, very bad pain, or signs of infection.  This information is not intended to replace advice given to you by your health care provider. Make sure you discuss any questions you have with your health care provider.  Document Released: 04/06/2008 Document Revised: 07/08/2016 Document Reviewed: 07/08/2016  Elsevier Interactive Patient Education © 2018 Elsevier Inc.

## 2018-05-12 NOTE — Progress Notes (Signed)
    GYNECOLOGY CLINIC COLPOSCOPY PROCEDURE NOTE  42 y.o. [id number] here for colposcopy for ASCUS with POSITIVE high risk HPV pap smear on 03/31/18. Discussed role for HPV in dysplasia, need for surveillance. She is s/p hyst and does not have a cervix present.  Patient given informed consent, signed copy in the chart, time out was performed.  Placed in lithotomy position. Vaginal cuff viewed with speculum and colposcope after application of acetic acid.   Colposcopy adequate? Yes No visible lesions; minimal uptake of acetic. Small nodule within acetowhite area biopsied at 9 o'clock. Specimen were labeled and sent to pathology.   Patient was given post procedure instructions.  Will follow up pathology and manage accordingly; patient will be contacted with results and recommendations.  Routine preventative health maintenance measures emphasized.   05/12/2018 9:38 AM Donnamae Jude MD

## 2018-05-17 ENCOUNTER — Telehealth: Payer: Self-pay

## 2018-05-17 ENCOUNTER — Encounter: Payer: Self-pay | Admitting: Family Medicine

## 2018-05-17 DIAGNOSIS — N890 Mild vaginal dysplasia: Secondary | ICD-10-CM | POA: Insufficient documentation

## 2018-05-17 NOTE — Telephone Encounter (Signed)
Informed patient on low changes to her biopsy and the need to follow up next year for a repeat pap smear.

## 2018-05-17 NOTE — Telephone Encounter (Signed)
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 Repeat PAP and cytology in 1 year. Please let patient know.

## 2018-06-01 ENCOUNTER — Encounter

## 2018-06-01 ENCOUNTER — Ambulatory Visit (HOSPITAL_COMMUNITY): Payer: 59 | Admitting: Psychiatry

## 2018-10-01 IMAGING — US ULTRASOUND RIGHT BREAST LIMITED
1 series · 10 of 10 positions shown · non-contrast
Comparison: Previous exam(s).

CLINICAL DATA: 42-year-old female presenting for annual bilateral
mammogram and delayed follow-up of probably benign right breast
masses.

EXAM:
DIGITAL DIAGNOSTIC BILATERAL MAMMOGRAM WITH TOMO
ULTRASOUND RIGHT BREAST

[Series 1: ultrasound right breast limited · 0.06mm/px · 10 of 10 slices shown]
[im 1/10]
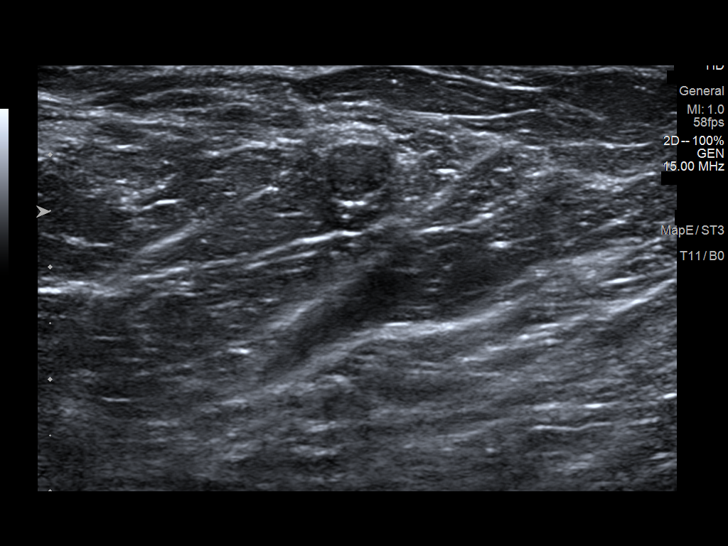
[im 2/10]
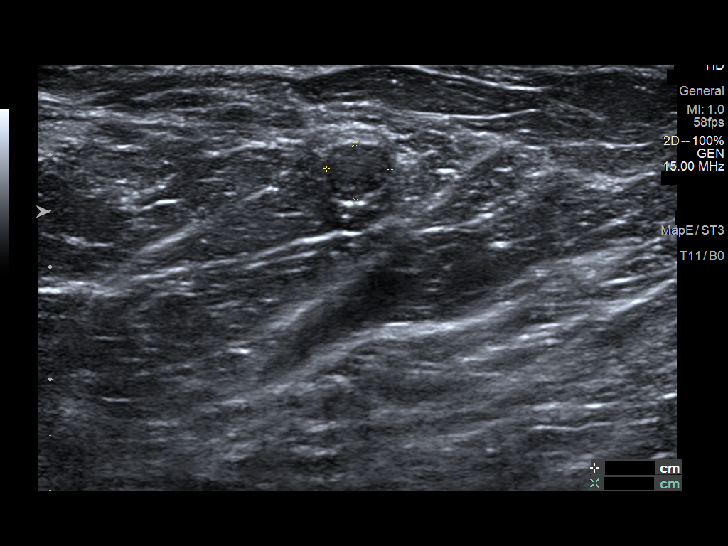
[im 3/10]
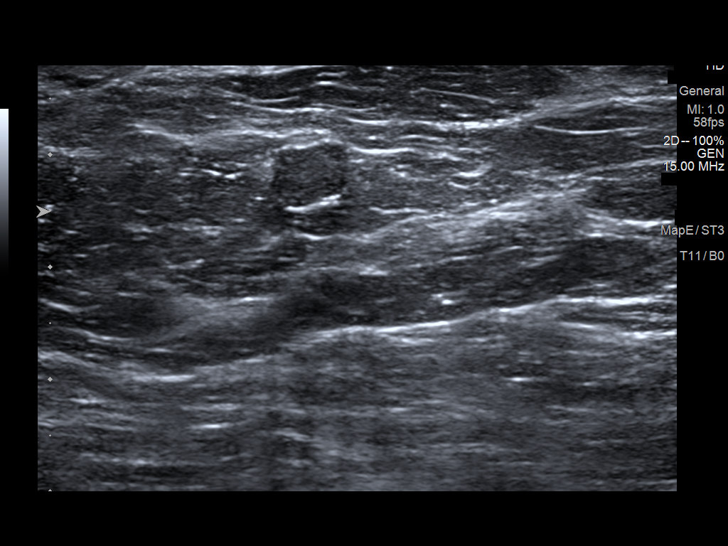
[im 4/10]
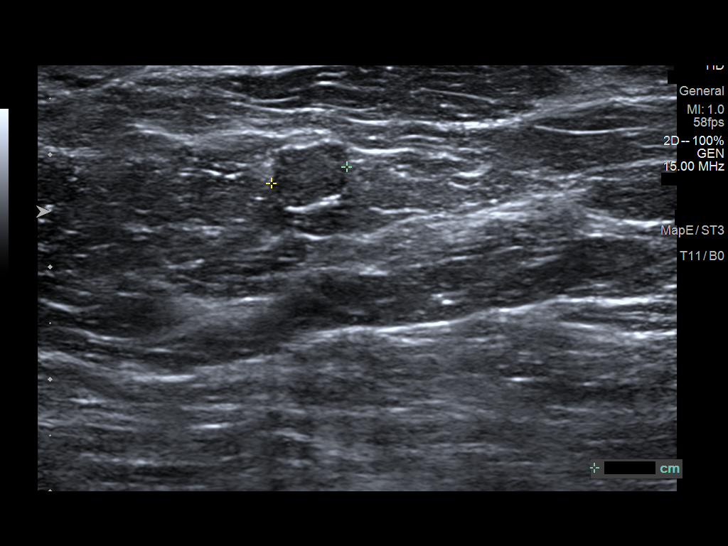
[im 5/10]
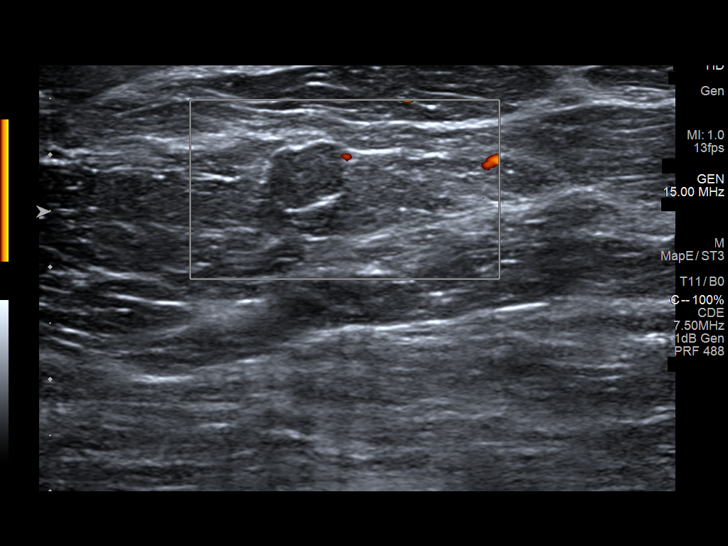
[im 6/10]
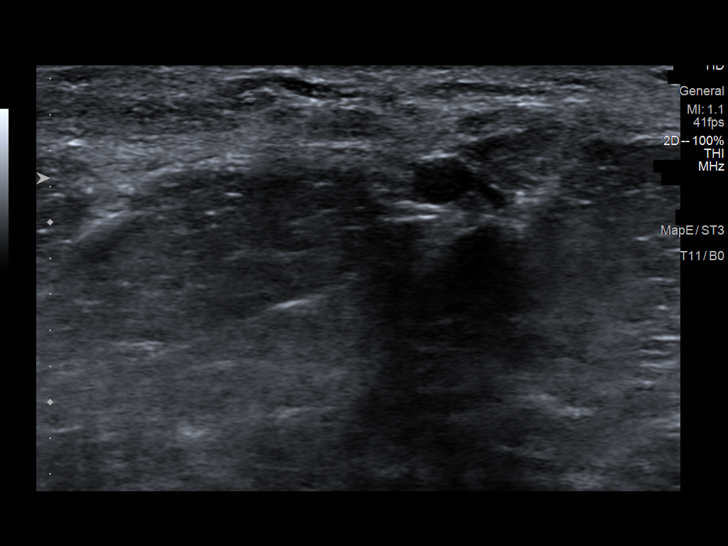
[im 7/10]
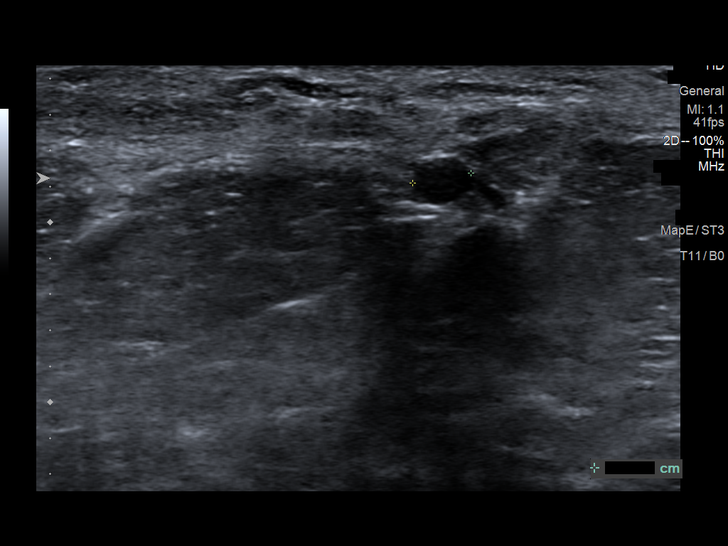
[im 8/10]
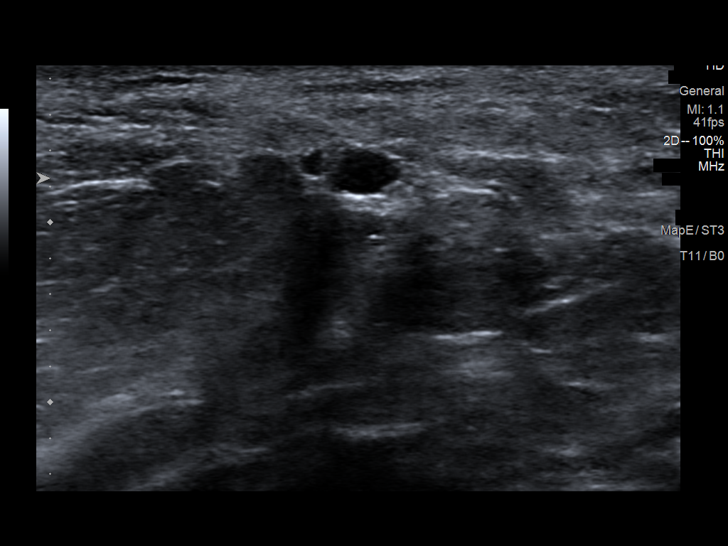
[im 9/10]
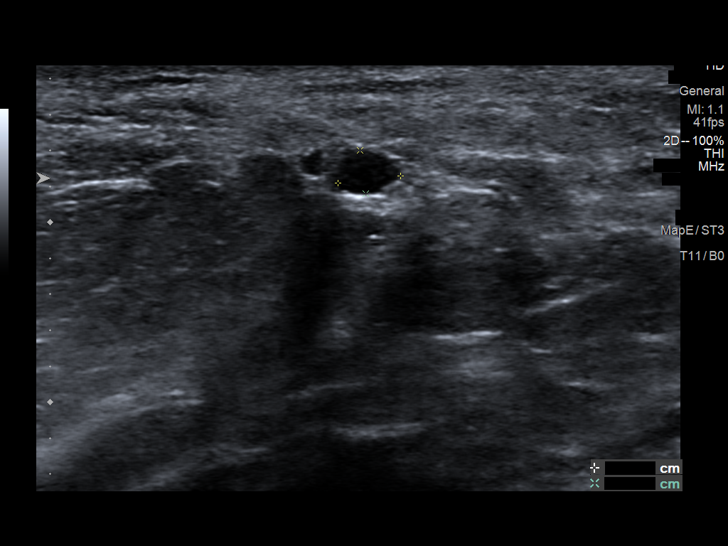
[im 10/10]
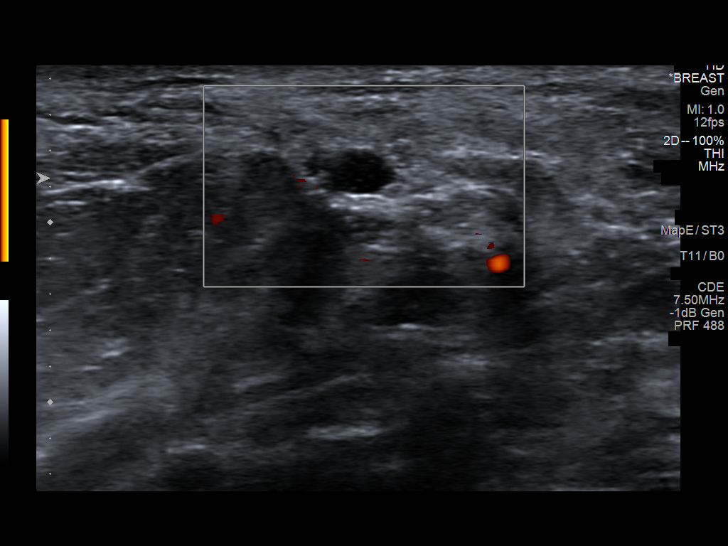

[10 of 10 positions shown; findings below may reference images not displayed]

ACR Breast Density Category c: The breast tissue is heterogeneously
dense, which may obscure small masses.
FINDINGS: Note is made of a circumscribed equal density masses bilaterally.
Previously identified masses within the right breast appears stable
from prior examinations. No suspicious mammographic findings are
identified in either breast.

Targeted ultrasound is performed, showing a stable, circumscribed
mass at the 1 o'clock position 1 cm from the nipple. It currently
measures 0.7 x 0.6 x 0.5 cm. There is no significant internal
vascularity. An additional, stable masses identified at the 6
o'clock position 1 cm from the nipple at middle depth. It measures
0.4 x 0.3 x 0.2 cm. Again there is no significant internal
vascularity.

The additional superficial mass at the 6 o'clock position are no
longer identified on today's study.
IMPRESSION: 1. Benign right breast masses, all demonstrating greater than 2 year
stability. No further imaging follow-up required.
2. No suspicious mammographic findings in either breast.

RECOMMENDATION:
Screening mammogram in one year.(Code:[id number]-[id number])

I have discussed the findings and recommendations with the patient.
Results were also provided in writing at the conclusion of the
visit. If applicable, a reminder letter will be sent to the patient
regarding the next appointment.

BI-RADS CATEGORY  2: Benign.

## 2018-11-11 ENCOUNTER — Other Ambulatory Visit: Payer: Self-pay

## 2018-11-11 ENCOUNTER — Emergency Department (HOSPITAL_COMMUNITY)
Admission: EM | Admit: 2018-11-11 | Discharge: 2018-11-11 | Disposition: A | Discharge status: Home / Self Care | Payer: Self-pay | Attending: Emergency Medicine | Admitting: Emergency Medicine

## 2018-11-11 ENCOUNTER — Encounter (HOSPITAL_COMMUNITY): Payer: Self-pay | Admitting: Emergency Medicine

## 2018-11-11 ENCOUNTER — Emergency Department (HOSPITAL_COMMUNITY): Payer: Self-pay

## 2018-11-11 DIAGNOSIS — F17210 Nicotine dependence, cigarettes, uncomplicated: Secondary | ICD-10-CM | POA: Insufficient documentation

## 2018-11-11 DIAGNOSIS — Z79899 Other long term (current) drug therapy: Secondary | ICD-10-CM | POA: Insufficient documentation

## 2018-11-11 DIAGNOSIS — M25562 Pain in left knee: Secondary | ICD-10-CM | POA: Insufficient documentation

## 2018-11-11 MED ORDER — NAPROXEN 500 MG PO TABS: 500.0000 mg | ORAL_TABLET | Freq: Two times a day (BID) | ORAL | 0 refills | Status: DC

## 2018-11-11 MED ORDER — NAPROXEN 500 MG PO TABS: 500.0000 mg | ORAL_TABLET | Freq: Two times a day (BID) | ORAL | 0 refills | Status: AC

## 2018-11-11 NOTE — ED Triage Notes (Signed)
Left knee pain x 1 week , denies any injury hurts to walk , has hx of  Knee replacement that side in may , haS BEEN TAKING otc MEDS NO HELP

## 2018-11-11 NOTE — ED Provider Notes (Signed)
[facility] DEPARTMENT Provider Note   CSN: [id number] Arrival date & time: 11/11/18  1017     History   Chief Complaint Chief Complaint  Patient presents with  . Knee Pain    HPI Theresa Cantrell is a 43 y.o. female.  HPI   Pt is a 43 y/o female with a h/o anxiety, bipolar disorder, arthritis, who presents to the ED today c/o left knee pain that began 5 days ago. Pain is constant and has been worsening since onset. Rates pain 8/10. Describes it as a stabbing/burning pain. Pt states she had a partial left knee replacement last May. She has had intermittent chronic pain in the knee even since the surgery but it has seemed to worsen recently. No recent falls or injuries. States she has had some swelling to the knee. Denies fevers or chills. No redness to the knee. Has been wearing brace since surgery and that improves sxs. She has also tried ibuprofen for sxs which has provided some relief.  She sees Dr. Theda Sers with [facility] for her knee. She has an appt in 2 weeks.  Past Medical History:  Diagnosis Date  . Anxiety   . Arthritis   . Hepatitis C     Patient Active Problem List   Diagnosis Date Noted  . 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 Neuropathy 02/07/2014  . Hot flashes 02/07/2014  . Weight loss 02/07/2014  . Anxiety     Past Surgical History:  Procedure Laterality Date  . ABDOMINAL HYSTERECTOMY    . CHOLECYSTECTOMY    . DILATION AND CURETTAGE OF UTERUS    . FOOT SURGERY Bilateral    two pins in each foot  . KNEE SURGERY       OB History    Gravida  2   Para  1   Term  1   Preterm  0   AB  1   Living  1     SAB  0   TAB  1   Ectopic  0   Multiple  0   Live Births               Home Medications    Prior to Admission medications   Medication Sig Start Date End Date Taking? Authorizing Provider  acyclovir (ZOVIRAX) 400 MG tablet Take 400 mg by mouth.    [provider]  ALPRAZolam Duanne Moron) 0.5 MG tablet Take 1 tablet (0.5 mg total) by mouth 2 (two) times daily as needed for anxiety. 08/22/15   Donnamae Jude, MD  ARIPiprazole (ABILIFY) 2 MG tablet Take 2 mg by mouth.    [provider]  busPIRone (BUSPAR) 10 MG tablet Take 10 mg by mouth.    [provider]  FLUoxetine (PROZAC) 10 MG capsule 10 mg.    [provider]  meloxicam (MOBIC) 15 MG tablet Take 1 tablet (15 mg total) by mouth daily as needed for pain. Patient not taking: Reported on 03/31/2018 08/21/17   Clayton Bibles, PA-C  Multiple Vitamin (MULTI VITAMIN PO) Take by mouth.    [provider]  naproxen (NAPROSYN) 500 MG tablet Take 1 tablet (500 mg total) by mouth 2 (two) times daily for 3 days. 11/11/18 11/14/18  Jaimon Bugaj S, PA-C  traZODone (DESYREL) 50 MG tablet Take 25-50 mg by mouth at bedtime as needed for sleep.  01/10/15   [provider]    Family History Family History  Problem Relation Age of Onset  . Hypertension Mother   . Cancer Mother        breast  . Anxiety disorder Mother   . Hypertension Father   . Schizophrenia Father   . Bipolar disorder Father   . Anxiety disorder Father     Social History Social History   Tobacco Use  . Smoking status: Current Some Day Smoker    Packs/day: 0.25    Types: Cigarettes  . Smokeless tobacco: Never Used  Substance Use Topics  . Alcohol use: Yes    Alcohol/week: 2.0 standard drinks    Types: 1 Glasses of wine, 1 Cans of beer per week  . Drug use: No     Allergies   Lamotrigine   Review of Systems Review of Systems  Constitutional: Negative for chills and fever.  Musculoskeletal:       Left knee pain and swelling  Skin: Negative for color change.  Neurological: Negative for numbness.     Physical Exam Updated Vital Signs BP (!) 129/100 (BP Location: Right Arm)   Pulse 91   Temp 98.6 F (37 C) (Oral)   Resp 16   SpO2 98%   Physical Exam Vitals signs  and nursing note reviewed.  Constitutional:      General: She is not in acute distress.    Appearance: She is well-developed.  HENT:     Head: Normocephalic and atraumatic.  Eyes:     Conjunctiva/sclera: Conjunctivae normal.  Neck:     Musculoskeletal: Neck supple.  Cardiovascular:     Rate and Rhythm: Normal rate.  Pulmonary:     Effort: Pulmonary effort is normal.  Abdominal:     Palpations: Abdomen is soft.  Musculoskeletal:     Comments: TTP the the inferior and medial aspect of the patella. Swelling and effusion is present. Pt able to flex knee to 90 degrees. No erythema to the knee. Able to bear weight.   Skin:    General: Skin is warm and dry.  Neurological:     Mental Status: She is alert.     ED Treatments / Results  Labs (all labs ordered are listed, but only abnormal results are displayed) Labs Reviewed - No data to display  EKG None  Radiology Dg Knee Complete 4 Views Left  Result Date: 11/11/2018 CLINICAL DATA:  Knee pain, no known injury, initial encounter EXAM: LEFT KNEE - COMPLETE 4+ VIEW COMPARISON:  08/21/2017 FINDINGS: Previously seen tibial screw has been removed in the interval. Prosthesis is noted along the anterior aspect of the distal femur articulating with the patella. Small joint effusion is seen. No acute fracture is noted. IMPRESSION: Postsurgical changes with small joint effusion. No acute bony abnormality is noted. Electronically Signed   By: Inez Catalina M.D.   On: 11/11/2018 08:59    Procedures Procedures (including critical care time) SPLINT APPLICATION Date/Time: 0:34 AM Authorized by: Rodney Booze Consent: Verbal consent obtained. Risks and benefits: risks, benefits and alternatives were discussed Consent given by: patient Splint applied by: orthopedic technician Location details: LLE Splint type: knee immobilizer Post-procedure: The splinted body part was neurovascularly unchanged following the procedure. Patient tolerance:  Patient tolerated the procedure well with no immediate complications.   Medications Ordered in ED Medications - No data to display   Initial Impression / Assessment and Plan / ED Course  I have reviewed the triage vital signs and the nursing notes.  Pertinent labs & imaging results that were available during my  care of the patient were reviewed by me and considered in my medical decision making (see chart for details).     Final Clinical Impressions(s) / ED Diagnoses   Final diagnoses:  Acute pain of left knee   Pt with mild swelling to the joint spaces, knee swelling, tightness in the knee, and mildly restricted range of motion. Pt unable to perform full flexion of the knee.  Pt is without systemic symptoms, erythema or redness of the joint consistent with gout or septic joint.  Patient X-Ray negative for obvious fracture or dislocation. Did show some mild effusion. Pt advised to follow up with orthopedics if symptoms persist for further evaluation and treatment. Patient given brace while in ED, conservative therapy recommended and discussed. Patient will be dc home & is agreeable with above plan. Specific return precautions were discussed all questions answered.  ED Discharge Orders         Ordered    naproxen (NAPROSYN) 500 MG tablet  2 times daily,   Status:  Discontinued     11/11/18 0936    naproxen (NAPROSYN) 500 MG tablet  2 times daily     11/11/18 0936           Rodney Booze, PA-C 11/11/18 [id number]    Little, Wenda Overland, MD 11/11/18 [phone]

## 2018-11-11 NOTE — Progress Notes (Signed)
Orthopedic Tech Progress Note Patient Details:  Theresa Cantrell 16-Dec-1975 [id number]  Ortho Devices Type of Ortho Device: Knee Immobilizer Ortho Device/Splint Location: lle Ortho Device/Splint Interventions: Ordered, Application, Adjustment   Post Interventions Patient Tolerated: Well Instructions Provided: Care of device, Adjustment of device   Karolee Stamps 11/11/2018, 10:04 AM

## 2018-11-11 NOTE — Discharge Instructions (Addendum)
You may alternate taking Tylenol and Naproxen as needed for pain control. You may take Naproxen twice daily as directed on your discharge paperwork and you may take  [phone] mg of Tylenol every 6 hours. Do not exceed 4000 mg of Tylenol daily as this can lead to liver damage. Also, make sure to take Naproxen with meals as it can cause an upset stomach. Do not take other NSAIDs while taking Naproxen such as (Aleve, Ibuprofen, Aspirin, Celebrex, etc) and do not take more than the prescribed dose as this can lead to ulcers and bleeding in your GI tract. You may use warm and cold compresses to help with your symptoms.   Please follow up with your primary doctor within the next 7-10 days for re-evaluation and further treatment of your symptoms. You need to make an appointment with your orthopedic doctor.   Please return to the emergency room immediately if you experience any new or worsening symptoms or any symptoms that indicate worsening infection such as fevers, increased redness/swelling/pain, warmth, decreased range of motion, or drainage from the affected area.

## 2019-01-10 ENCOUNTER — Encounter: Payer: Self-pay | Admitting: Radiology

## 2019-04-19 ENCOUNTER — Emergency Department (HOSPITAL_COMMUNITY): Payer: No Typology Code available for payment source

## 2019-04-19 ENCOUNTER — Other Ambulatory Visit: Payer: Self-pay

## 2019-04-19 ENCOUNTER — Emergency Department (HOSPITAL_COMMUNITY)
Admission: EM | Admit: 2019-04-19 | Discharge: 2019-04-19 | Disposition: A | Discharge status: Home / Self Care | Payer: No Typology Code available for payment source | Attending: Emergency Medicine | Admitting: Emergency Medicine

## 2019-04-19 ENCOUNTER — Encounter (HOSPITAL_COMMUNITY): Payer: Self-pay | Admitting: Emergency Medicine

## 2019-04-19 DIAGNOSIS — Y929 Unspecified place or not applicable: Secondary | ICD-10-CM | POA: Diagnosis not present

## 2019-04-19 DIAGNOSIS — W1830XA Fall on same level, unspecified, initial encounter: Secondary | ICD-10-CM | POA: Diagnosis not present

## 2019-04-19 DIAGNOSIS — M25561 Pain in right knee: Secondary | ICD-10-CM | POA: Insufficient documentation

## 2019-04-19 DIAGNOSIS — Y939 Activity, unspecified: Secondary | ICD-10-CM | POA: Diagnosis not present

## 2019-04-19 DIAGNOSIS — F17210 Nicotine dependence, cigarettes, uncomplicated: Secondary | ICD-10-CM | POA: Diagnosis not present

## 2019-04-19 DIAGNOSIS — Z79899 Other long term (current) drug therapy: Secondary | ICD-10-CM | POA: Insufficient documentation

## 2019-04-19 DIAGNOSIS — Z96651 Presence of right artificial knee joint: Secondary | ICD-10-CM | POA: Insufficient documentation

## 2019-04-19 DIAGNOSIS — S8991XA Unspecified injury of right lower leg, initial encounter: Secondary | ICD-10-CM | POA: Diagnosis present

## 2019-04-19 DIAGNOSIS — M25562 Pain in left knee: Secondary | ICD-10-CM

## 2019-04-19 DIAGNOSIS — Y999 Unspecified external cause status: Secondary | ICD-10-CM | POA: Diagnosis not present

## 2019-04-19 DIAGNOSIS — B182 Chronic viral hepatitis C: Secondary | ICD-10-CM | POA: Diagnosis not present

## 2019-04-19 MED ORDER — NAPROXEN 500 MG PO TABS: 500.0000 mg | ORAL_TABLET | Freq: Two times a day (BID) | ORAL | 0 refills | Status: AC

## 2019-04-19 MED ORDER — KETOROLAC TROMETHAMINE 30 MG/ML IJ SOLN
30.0000 mg | Freq: Once | INTRAMUSCULAR | Status: AC
  Administered 2019-04-19: 30 mg via INTRAMUSCULAR
  Filled 2019-04-19: qty 1

## 2019-04-19 MED ORDER — NAPROXEN 500 MG PO TABS: 500.0000 mg | ORAL_TABLET | Freq: Two times a day (BID) | ORAL | 0 refills | Status: DC

## 2019-04-19 NOTE — ED Provider Notes (Signed)
[facility] EMERGENCY DEPARTMENT Provider Note   CSN: [id number] Arrival date & time: 04/19/19  2376    History   Chief Complaint Chief Complaint  Patient presents with  . Knee Pain    left; from a fall.     HPI Theresa Cantrell is a 43 y.o. female.     43 y.o female with a PMH of Anxiety, Hepatitis C presents to the ED with a chief complaint of left knee pain x 5 days. Patient reports she had a fall 5 days ago were she tripped and fell landing on the left leg, since the incident she reports pain with ambulation. She reports swelling along with pain to the medial aspect of her knee. She does have a prior history of knee replacement by Dr. Theda Sers of [facility] orthopedics. She has been taken meloxicam for her pain but reports no improvement in symptoms. She denies any fever, weakness, other injuries.  Of note, patient is ambulatory with a cane which is not her baseline.      Past Medical History:  Diagnosis Date  . Anxiety   . Arthritis   . Hepatitis C     Patient Active Problem List   Diagnosis Date Noted  . 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 Neuropathy 02/07/2014  . Hot flashes 02/07/2014  . Weight loss 02/07/2014  . Anxiety     Past Surgical History:  Procedure Laterality Date  . ABDOMINAL HYSTERECTOMY    . CHOLECYSTECTOMY    . DILATION AND CURETTAGE OF UTERUS    . FOOT SURGERY Bilateral    two pins in each foot  . KNEE SURGERY       OB History    Gravida  2   Para  1   Term  1   Preterm  0   AB  1   Living  1     SAB  0   TAB  1   Ectopic  0   Multiple  0   Live Births               Home Medications    Prior to Admission medications   Medication Sig Start Date End Date Taking? Authorizing Provider  acyclovir (ZOVIRAX) 400 MG tablet Take 400 mg by mouth.    [provider]  ALPRAZolam Duanne Moron) 0.5 MG tablet Take 1 tablet (0.5 mg total) by mouth 2 (two) times daily as  needed for anxiety. 08/22/15   Donnamae Jude, MD  ARIPiprazole (ABILIFY) 2 MG tablet Take 2 mg by mouth.    [provider]  busPIRone (BUSPAR) 10 MG tablet Take 10 mg by mouth.    [provider]  FLUoxetine (PROZAC) 10 MG capsule 10 mg.    [provider]  meloxicam (MOBIC) 15 MG tablet Take 1 tablet (15 mg total) by mouth daily as needed for pain. Patient not taking: Reported on 03/31/2018 08/21/17   Clayton Bibles, PA-C  Multiple Vitamin (MULTI VITAMIN PO) Take by mouth.    [provider]  traZODone (DESYREL) 50 MG tablet Take 25-50 mg by mouth at bedtime as needed for sleep.  01/10/15   [provider]    Family History Family History  Problem Relation Age of Onset  . Hypertension Mother   . Cancer Mother        breast  . Anxiety disorder Mother   . Hypertension Father   . Schizophrenia Father   . Bipolar  disorder Father   . Anxiety disorder Father     Social History Social History   Tobacco Use  . Smoking status: Current Some Day Smoker    Packs/day: 0.25    Types: Cigarettes  . Smokeless tobacco: Never Used  Substance Use Topics  . Alcohol use: Yes    Alcohol/week: 2.0 standard drinks    Types: 1 Glasses of wine, 1 Cans of beer per week  . Drug use: No     Allergies   Lamotrigine   Review of Systems Review of Systems  Constitutional: Negative for fever.  Musculoskeletal: Positive for arthralgias and joint swelling.     Physical Exam Updated Vital Signs Ht 5\' 5"  (1.651 m)   Wt 93 kg   LMP  (Exact Date)   BMI 34.11 kg/m   Physical Exam Vitals signs and nursing note reviewed.  Constitutional:      General: She is not in acute distress.    Appearance: She is well-developed.  HENT:     Head: Normocephalic and atraumatic.     Mouth/Throat:     Pharynx: No oropharyngeal exudate.  Eyes:     Pupils: Pupils are equal, round, and reactive to light.  Neck:     Musculoskeletal: Normal range of motion.   Cardiovascular:     Rate and Rhythm: Regular rhythm.     Heart sounds: Normal heart sounds.  Pulmonary:     Effort: Pulmonary effort is normal. No respiratory distress.     Breath sounds: Normal breath sounds.  Abdominal:     General: Bowel sounds are normal. There is no distension.     Palpations: Abdomen is soft.     Tenderness: There is no abdominal tenderness.  Musculoskeletal:        General: No tenderness or deformity.     Right knee: Normal.     Left knee: She exhibits swelling. She exhibits no ecchymosis, no deformity, no laceration and no erythema.     Right lower leg: No edema.     Left lower leg: No edema.       Legs:  Skin:    General: Skin is warm and dry.  Neurological:     Mental Status: She is alert and oriented to person, place, and time.      ED Treatments / Results  Labs (all labs ordered are listed, but only abnormal results are displayed) Labs Reviewed - No data to display  EKG None  Radiology No results found.  Procedures Procedures (including critical care time)  Medications Ordered in ED Medications - No data to display   Initial Impression / Assessment and Plan / ED Course  I have reviewed the triage vital signs and the nursing notes.  Pertinent labs & imaging results that were available during my care of the patient were reviewed by me and considered in my medical decision making (see chart for details).    Patient with a past medical history of left knee replacement presents to the ED with complaints of left knee pain, reports she had a fall several days ago and has noted swelling along with pain to the medial aspect of her knee.  She has been taking meloxicam without improvement in symptoms.  Patient had a partial knee replacement to her left knee by Dr. Theda Sers of [facility] orthopedics several years ago.  No changes in the skin, erythema, streaking low suspicion for any cellulitis.  During evaluation patient is well-appearing, vitals  are stable.  Neurovascular intact.  Will obtain imaging to further evaluate left knee pain.DG left knee showed: Joint effusion which is either chronic or recurrent. Postsurgical  changes as described. Stable slight arthritic changes.   I have discussed these results with patient, she will be provided with an Ace wrap while in the ED.  I will also prescribe her naproxen for her symptoms.  She is advised to follow-up with her original orthopedist who did her knee replacement, patient will schedule an appointment.  RICE therapy encouraged.  Return precautions provided.   Portions of this note were generated with Lobby[employer]. Dictation errors may occur despite best attempts at proofreading.    Final Clinical Impressions(s) / ED Diagnoses   Final diagnoses:  Acute pain of left knee    ED Discharge Orders    None       Janeece Fitting, PA-C 04/19/19 1047    Drenda Freeze, MD 04/20/19 [phone]

## 2019-04-19 NOTE — ED Triage Notes (Signed)
Pt report falling on Friday 6/12 and injuring her left knee. Pt reports increased pain and swelling to the knee.

## 2019-04-19 NOTE — Discharge Instructions (Addendum)
I have prescribed medication to help reduce swelling to your left knee, please take 1 tablet twice daily for pain relieve. Please follow up with [facility] to further evaluate your knee swelling.

## 2019-06-11 ENCOUNTER — Emergency Department (HOSPITAL_COMMUNITY): Payer: Self-pay

## 2019-06-11 ENCOUNTER — Emergency Department (HOSPITAL_COMMUNITY)
Admission: EM | Admit: 2019-06-11 | Discharge: 2019-06-11 | Disposition: A | Discharge status: Home / Self Care | Payer: Self-pay | Attending: Emergency Medicine | Admitting: Emergency Medicine

## 2019-06-11 ENCOUNTER — Encounter (HOSPITAL_COMMUNITY): Payer: Self-pay | Admitting: *Deleted

## 2019-06-11 ENCOUNTER — Other Ambulatory Visit: Payer: Self-pay

## 2019-06-11 DIAGNOSIS — R1110 Vomiting, unspecified: Secondary | ICD-10-CM | POA: Insufficient documentation

## 2019-06-11 DIAGNOSIS — R079 Chest pain, unspecified: Secondary | ICD-10-CM | POA: Insufficient documentation

## 2019-06-11 DIAGNOSIS — F17210 Nicotine dependence, cigarettes, uncomplicated: Secondary | ICD-10-CM | POA: Insufficient documentation

## 2019-06-11 DIAGNOSIS — Z79899 Other long term (current) drug therapy: Secondary | ICD-10-CM | POA: Insufficient documentation

## 2019-06-11 LAB — BASIC METABOLIC PANEL
Anion gap: 11 (ref 5–15)
BUN: 16 mg/dL (ref 6–20)
CO2: 24 mmol/L (ref 22–32)
Calcium: 9.2 mg/dL (ref 8.9–10.3)
Chloride: 104 mmol/L (ref 98–111)
Creatinine, Ser: 0.93 mg/dL (ref 0.44–1.00)
GFR calc Af Amer: >60 mL/min (ref >60)
GFR calc non Af Amer: >60 mL/min (ref >60)
Glucose, Bld: 97 mg/dL (ref 70–99)
Potassium: 3.6 mmol/L (ref 3.5–5.1)
Sodium: 139 mmol/L (ref 135–145)

## 2019-06-11 LAB — CBC
HCT: 40.3 % (ref 36.0–46.0)
Hemoglobin: 13.5 g/dL (ref 12.0–15.0)
MCH: 30.9 pg (ref 26.0–34.0)
MCHC: 33.5 g/dL (ref 30.0–36.0)
MCV: 92.2 fL (ref 80.0–100.0)
Platelets: 275 10*3/uL (ref 150–400)
RBC: 4.37 MIL/uL (ref 3.87–5.11)
RDW: 13.3 % (ref 11.5–15.5)
WBC: 9.2 10*3/uL (ref 4.0–10.5)
nRBC: 0 % (ref 0.0–0.2)

## 2019-06-11 LAB — I-STAT BETA HCG BLOOD, ED (MC, WL, AP ONLY): I-stat hCG, quantitative: <5 m[IU]/mL (ref <5)

## 2019-06-11 LAB — TROPONIN I (HIGH SENSITIVITY)
Troponin I (High Sensitivity): 4 ng/L (ref <18)
Troponin I (High Sensitivity): <2 ng/L (ref <18)

## 2019-06-11 MED ORDER — ALUM & MAG HYDROXIDE-SIMETH 200-200-20 MG/5ML PO SUSP
30.0000 mL | Freq: Once | ORAL | Status: AC
  Administered 2019-06-11: 30 mL via ORAL
  Filled 2019-06-11: qty 30

## 2019-06-11 MED ORDER — LIDOCAINE VISCOUS HCL 2 % MT SOLN
15.0000 mL | Freq: Once | OROMUCOSAL | Status: AC
  Administered 2019-06-11: 06:00:00 15 mL via ORAL
  Filled 2019-06-11: qty 15

## 2019-06-11 MED ORDER — SODIUM CHLORIDE 0.9% FLUSH: 3.0000 mL | Freq: Once | INTRAVENOUS | Status: DC

## 2019-06-11 MED ORDER — PANTOPRAZOLE SODIUM 40 MG PO TBEC
40.0000 mg | DELAYED_RELEASE_TABLET | Freq: Once | ORAL | Status: AC
  Administered 2019-06-11: 40 mg via ORAL
  Filled 2019-06-11: qty 1

## 2019-06-11 MED ORDER — PANTOPRAZOLE SODIUM 20 MG PO TBEC: 20.0000 mg | DELAYED_RELEASE_TABLET | Freq: Every day | ORAL | 1 refills | Status: DC

## 2019-06-11 NOTE — Discharge Instructions (Signed)
Your work-up in the emergency department today was reassuring.  It did not reveal a concerning cause of your chest pain.  Take Protonix as prescribed and follow-up with your primary care doctor.  We recommend recheck of your blood pressure in 1 week.  You may return to the ED for any new or concerning symptoms.

## 2019-06-11 NOTE — ED Triage Notes (Signed)
Pt reports started coughing today and then followed by several episodes of n/v and now having CP. Denies fever or other symptoms. No acute distress noted at triage.

## 2019-06-11 NOTE — ED Provider Notes (Signed)
[facility] EMERGENCY DEPARTMENT Provider Note   CSN: [id number] Arrival date & time: 06/11/19  0236    History   Chief Complaint Chief Complaint  Patient presents with  . Cough  . Emesis    HPI Theresa Cantrell is a 43 y.o. female.     43 year old female with history of anxiety as well as hepatitis C presents to the emergency department for evaluation of chest discomfort.  Her chest pain began after she was lying on her side.  She began to have a coughing spell which escalated to nausea and posttussive emesis.  Since vomiting, patient has been having a burning discomfort in her chest as well as a sore throat.  She has not had any fevers, shortness of breath, syncope or near syncope, significant congestion.  Endorses remote history of reflux, but is not currently medicated for this.  Denies taking any medicines prior to arrival.  The history is provided by the patient. No language interpreter was used.  Cough Emesis Associated symptoms: cough     Past Medical History:  Diagnosis Date  . Anxiety   . Arthritis   . Hepatitis C     Patient Active Problem List   Diagnosis Date Noted  . 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 Neuropathy 02/07/2014  . Hot flashes 02/07/2014  . Weight loss 02/07/2014  . Anxiety     Past Surgical History:  Procedure Laterality Date  . ABDOMINAL HYSTERECTOMY    . CHOLECYSTECTOMY    . DILATION AND CURETTAGE OF UTERUS    . FOOT SURGERY Bilateral    two pins in each foot  . KNEE SURGERY       OB History    Gravida  2   Para  1   Term  1   Preterm  0   AB  1   Living  1     SAB  0   TAB  1   Ectopic  0   Multiple  0   Live Births               Home Medications    Prior to Admission medications   Medication Sig Start Date End Date Taking? Authorizing Provider  ARIPiprazole (ABILIFY) 2 MG tablet Take 2 mg by mouth daily.    Yes [provider]  busPIRone  (BUSPAR) 10 MG tablet Take 10 mg by mouth 2 (two) times daily.    Yes [provider]  FLUoxetine (PROZAC) 10 MG capsule Take 10 mg by mouth daily.    Yes [provider]  meloxicam (MOBIC) 15 MG tablet Take 1 tablet (15 mg total) by mouth daily as needed for pain. 08/21/17  Yes West, Emily, PA-C  ALPRAZolam Duanne Moron) 0.5 MG tablet Take 1 tablet (0.5 mg total) by mouth 2 (two) times daily as needed for anxiety. Patient not taking: Reported on 06/11/2019 08/22/15   Donnamae Jude, MD  pantoprazole (PROTONIX) 20 MG tablet Take 1 tablet (20 mg total) by mouth daily. 06/11/19   Antonietta Breach, PA-C    Family History Family History  Problem Relation Age of Onset  . Hypertension Mother   . Cancer Mother        breast  . Anxiety disorder Mother   . Hypertension Father   . Schizophrenia Father   . Bipolar disorder Father   . Anxiety disorder Father     Social History Social History   Tobacco Use  .  Smoking status: Current Some Day Smoker    Packs/day: 0.25    Types: Cigarettes  . Smokeless tobacco: Never Used  Substance Use Topics  . Alcohol use: Yes    Alcohol/week: 2.0 standard drinks    Types: 1 Glasses of wine, 1 Cans of beer per week  . Drug use: No     Allergies   Lamotrigine   Review of Systems Review of Systems  Respiratory: Positive for cough.   Gastrointestinal: Positive for vomiting.  Ten systems reviewed and are negative for acute change, except as noted in the HPI.    Physical Exam Updated Vital Signs BP (!) 159/98   Pulse 77   Temp 97.9 F (36.6 C) (Oral)   Resp 13   SpO2 97%   Physical Exam Vitals signs and nursing note reviewed.  Constitutional:      General: She is not in acute distress.    Appearance: She is well-developed. She is not diaphoretic.     Comments: Nontoxic appearing and in NAD  HENT:     Head: Normocephalic and atraumatic.  Eyes:     General: No scleral icterus.    Conjunctiva/sclera: Conjunctivae normal.  Neck:      Musculoskeletal: Normal range of motion.  Cardiovascular:     Rate and Rhythm: Normal rate and regular rhythm.     Pulses: Normal pulses.  Pulmonary:     Effort: Pulmonary effort is normal. No respiratory distress.     Breath sounds: No stridor. No wheezing, rhonchi or rales.     Comments: Lungs CTAB. Respirations even and unlabored. Musculoskeletal: Normal range of motion.     Comments: No BLE edema.  Skin:    General: Skin is warm and dry.     Coloration: Skin is not pale.     Findings: No erythema or rash.  Neurological:     Mental Status: She is alert and oriented to person, place, and time.  Psychiatric:        Behavior: Behavior normal.      ED Treatments / Results  Labs (all labs ordered are listed, but only abnormal results are displayed) Labs Reviewed  BASIC METABOLIC PANEL  CBC  I-STAT BETA HCG BLOOD, ED ([place], [facility], [facility] ONLY)  TROPONIN I (HIGH SENSITIVITY)  TROPONIN I (HIGH SENSITIVITY)    EKG EKG Interpretation  Date/Time:  Sunday June 11 2019 02:52:03 EDT Ventricular Rate:  76 PR Interval:  144 QRS Duration: 76 QT Interval:  388 QTC Calculation: 436 R Axis:   80 Text Interpretation:  Normal sinus rhythm Nonspecific T wave abnormality Abnormal ECG No old tracing to compare Confirmed by Delora Fuel ([id number]) on 06/11/2019 3:50:23 AM   Radiology Dg Chest 2 View  Result Date: 06/11/2019 CLINICAL DATA:  Chest pain EXAM: CHEST - 2 VIEW COMPARISON:  11/07/2012 FINDINGS: The heart size and mediastinal contours are within normal limits. Both lungs are clear. The visualized skeletal structures are unremarkable. IMPRESSION: No active cardiopulmonary disease. Electronically Signed   By: Ulyses Jarred M.D.   On: 06/11/2019 03:16    Procedures Procedures (including critical care time)  Medications Ordered in ED Medications  sodium chloride flush (NS) 0.9 % injection 3 mL (has no administration in time range)  alum & mag hydroxide-simeth (MAALOX/MYLANTA) 200-200-20  MG/5ML suspension 30 mL (30 mLs Oral Given 06/11/19 0554)    And  lidocaine (XYLOCAINE) 2 % viscous mouth solution 15 mL (15 mLs Oral Given 06/11/19 0554)  pantoprazole (PROTONIX) EC tablet 40 mg (40  mg Oral Given 06/11/19 0554)     Initial Impression / Assessment and Plan / ED Course  I have reviewed the triage vital signs and the nursing notes.  Pertinent labs & imaging results that were available during my care of the patient were reviewed by me and considered in my medical decision making (see chart for details).        43 year old female presenting for chest pain which began after an episode of posttussive emesis.  She is also experiencing a sore throat since vomiting.  Suspect her chest discomfort to be secondary to mild reflux/esophagitis provoked by persistent emesis and gastric acid.  She is tolerating her secretions.  Cardiac work-up has been reassuring.  Her symptoms are atypical for ACS and I do not feel further emergent work-up is indicated.  She will be discharged with a course of Protonix.  Encouraged to follow-up with her primary doctor.  Return precautions discussed and provided.  Patient discharged in stable condition with no unaddressed concerns.   Final Clinical Impressions(s) / ED Diagnoses   Final diagnoses:  Post-tussive emesis    ED Discharge Orders         Ordered    pantoprazole (PROTONIX) 20 MG tablet  Daily     06/11/19 0601           Antonietta Breach, PA-C 06/11/19 [place], [place], MD 06/12/19 [phone]

## 2019-07-06 ENCOUNTER — Emergency Department (HOSPITAL_COMMUNITY)
Admission: EM | Admit: 2019-07-06 | Discharge: 2019-07-06 | Disposition: A | Discharge status: Home / Self Care | Payer: Self-pay | Attending: Emergency Medicine | Admitting: Emergency Medicine

## 2019-07-06 ENCOUNTER — Encounter (HOSPITAL_COMMUNITY): Payer: Self-pay | Admitting: Emergency Medicine

## 2019-07-06 ENCOUNTER — Other Ambulatory Visit: Payer: Self-pay

## 2019-07-06 DIAGNOSIS — Y281XXA Contact with knife, undetermined intent, initial encounter: Secondary | ICD-10-CM | POA: Insufficient documentation

## 2019-07-06 DIAGNOSIS — Y93G1 Activity, food preparation and clean up: Secondary | ICD-10-CM | POA: Insufficient documentation

## 2019-07-06 DIAGNOSIS — F17210 Nicotine dependence, cigarettes, uncomplicated: Secondary | ICD-10-CM | POA: Insufficient documentation

## 2019-07-06 DIAGNOSIS — Y92000 Kitchen of unspecified non-institutional (private) residence as  the place of occurrence of the external cause: Secondary | ICD-10-CM | POA: Insufficient documentation

## 2019-07-06 DIAGNOSIS — Z79899 Other long term (current) drug therapy: Secondary | ICD-10-CM | POA: Insufficient documentation

## 2019-07-06 DIAGNOSIS — Z23 Encounter for immunization: Secondary | ICD-10-CM | POA: Insufficient documentation

## 2019-07-06 DIAGNOSIS — Y999 Unspecified external cause status: Secondary | ICD-10-CM | POA: Insufficient documentation

## 2019-07-06 DIAGNOSIS — S61011A Laceration without foreign body of right thumb without damage to nail, initial encounter: Secondary | ICD-10-CM | POA: Insufficient documentation

## 2019-07-06 MED ORDER — TETANUS-DIPHTH-ACELL PERTUSSIS 5-2.5-18.5 LF-MCG/0.5 IM SUSP
0.5000 mL | Freq: Once | INTRAMUSCULAR | Status: AC
  Administered 2019-07-06: 0.5 mL via INTRAMUSCULAR
  Filled 2019-07-06: qty 0.5

## 2019-07-06 MED ORDER — LIDOCAINE HCL (PF) 1 % IJ SOLN
10.0000 mL | Freq: Once | INTRAMUSCULAR | Status: AC
  Administered 2019-07-06: 10 mL via INTRADERMAL
  Filled 2019-07-06: qty 10

## 2019-07-06 NOTE — ED Provider Notes (Addendum)
[facility] EMERGENCY DEPARTMENT Provider Note   CSN: [id number] Arrival date & time: 07/06/19  1128     History   Chief Complaint Chief Complaint  Patient presents with  . Finger Injury    HPI Theresa Cantrell is a 43 y.o. female. Patient presents for a laceration that occurred 2 hours ago this morning while she was doing dishes.  Patient states she immediately applied bandages in order to stop the bleeding.  Patient states the pain has been constant since. States that she is unsure of her last tetanus but that it was more than 5 years ago.      HPI  Past Medical History:  Diagnosis Date  . Anxiety   . Arthritis   . Hepatitis C     Patient Active Problem List   Diagnosis Date Noted  . 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 Neuropathy 02/07/2014  . Hot flashes 02/07/2014  . Weight loss 02/07/2014  . Anxiety     Past Surgical History:  Procedure Laterality Date  . ABDOMINAL HYSTERECTOMY    . CHOLECYSTECTOMY    . DILATION AND CURETTAGE OF UTERUS    . FOOT SURGERY Bilateral    two pins in each foot  . KNEE SURGERY       OB History    Gravida  2   Para  1   Term  1   Preterm  0   AB  1   Living  1     SAB  0   TAB  1   Ectopic  0   Multiple  0   Live Births               Home Medications    Prior to Admission medications   Medication Sig Start Date End Date Taking? Authorizing Provider  ALPRAZolam Duanne Moron) 0.5 MG tablet Take 1 tablet (0.5 mg total) by mouth 2 (two) times daily as needed for anxiety. Patient not taking: Reported on 06/11/2019 08/22/15   Donnamae Jude, MD  ARIPiprazole (ABILIFY) 2 MG tablet Take 2 mg by mouth daily.     [provider]  busPIRone (BUSPAR) 10 MG tablet Take 10 mg by mouth 2 (two) times daily.     [provider]  FLUoxetine (PROZAC) 10 MG capsule Take 10 mg by mouth daily.     [provider]  meloxicam (MOBIC) 15 MG tablet Take 1 tablet  (15 mg total) by mouth daily as needed for pain. 08/21/17   Clayton Bibles, PA-C  pantoprazole (PROTONIX) 20 MG tablet Take 1 tablet (20 mg total) by mouth daily. 06/11/19   Antonietta Breach, PA-C    Family History Family History  Problem Relation Age of Onset  . Hypertension Mother   . Cancer Mother        breast  . Anxiety disorder Mother   . Hypertension Father   . Schizophrenia Father   . Bipolar disorder Father   . Anxiety disorder Father     Social History Social History   Tobacco Use  . Smoking status: Current Some Day Smoker    Packs/day: 0.25    Types: Cigarettes  . Smokeless tobacco: Never Used  Substance Use Topics  . Alcohol use: Yes    Alcohol/week: 2.0 standard drinks    Types: 1 Glasses of wine, 1 Cans of beer per week  . Drug use: No     Allergies   Lamotrigine   Review  of Systems Review of Systems  Constitutional: Negative for fever.  HENT: Negative for sinus pressure.   Respiratory: Negative for shortness of breath.   Cardiovascular: Negative for chest pain.  Gastrointestinal: Negative for abdominal pain and nausea.  Musculoskeletal: Negative for arthralgias.  Skin: Positive for wound. Negative for rash.     Physical Exam Updated Vital Signs BP (!) 154/92 (BP Location: Left Arm)   Pulse 83   Temp 98.8 F (37.1 C) (Oral)   Resp 18   Ht 5\' 5"  (1.651 m)   Wt 90.7 kg   SpO2 98%   BMI 33.28 kg/m   Physical Exam Constitutional:      General: She is not in acute distress.    Appearance: Normal appearance. She is not ill-appearing.  HENT:     Head: Normocephalic and atraumatic.  Eyes:     General: No scleral icterus.       Right eye: No discharge.        Left eye: No discharge.     Conjunctiva/sclera: Conjunctivae normal.  Pulmonary:     Effort: Pulmonary effort is normal.     Breath sounds: No stridor.  Musculoskeletal:     Comments: Patient has intact flexion extension of the right thumb against resistance.   Skin:    General: Skin is  warm and dry.     Capillary Refill: Capillary refill takes less than 2 seconds.     Comments: Patient has 3 cm, deep, linear laceration to the medial side of the left thumb.   Neurological:     Mental Status: She is alert and oriented to person, place, and time. Mental status is at baseline.     Comments: Sensation is intact in all distal fingertips      ED Treatments / Results  Labs (all labs ordered are listed, but only abnormal results are displayed) Labs Reviewed - No data to display  EKG None  Radiology No results found.  Procedures .Marland KitchenLaceration Repair  Date/Time: 07/06/2019 1:26 PM Performed by: Tedd Sias, PA Authorized by: Tedd Sias, PA   Consent:    Consent obtained:  Verbal   Consent given by:  Patient   Risks discussed:  Infection and pain Anesthesia (see MAR for exact dosages):    Anesthesia method:  Nerve block   Block needle gauge:  27 G   Block anesthetic:  Lidocaine 1% w/o epi Laceration details:    Location:  Finger   Finger location:  L thumb   Length (cm):  2   Depth (mm):  2 Repair type:    Repair type:  Simple Pre-procedure details:    Preparation:  Patient was prepped and draped in usual sterile fashion Exploration:    Hemostasis achieved with:  Direct pressure   Wound extent: no nerve damage noted and no tendon damage noted     Contaminated: no   Treatment:    Area cleansed with:  Saline   Amount of cleaning:  Standard   Irrigation solution:  Sterile saline   Visualized foreign bodies/material removed: no   Skin repair:    Repair method:  Sutures   Suture size:  5-0   Suture material:  Prolene   Suture technique:  Simple interrupted   Number of sutures:  7 Approximation:    Approximation:  Close Post-procedure details:    Dressing:  Non-adherent dressing   Patient tolerance of procedure:  Tolerated well, no immediate complications   (including critical care time)  Medications Ordered  in ED Medications  lidocaine  (PF) (XYLOCAINE) 1 % injection 10 mL (has no administration in time range)     Initial Impression / Assessment and Plan / ED Course  I have reviewed the triage vital signs and the nursing notes.  Pertinent labs & imaging results that were available during my care of the patient were reviewed by me and considered in my medical decision making (see chart for details).   Patient with right laceration.  Received Tdap.  Wound irrigated and closed with 7 sutures.  No indications for antibiotics as wound is clean and was made by a knife while patient was washing with soap and water.  Patient's blood pressure is elevated during appointment today; patient states that her blood pressure has been elevated last few times it was checked and that she plans to be seen by a family doctor to begin taking medication.  Patient sent home with instructions use ibuprofen for pain control.  Return instructions given patient will be returning and 8 days for suture removal and wound check.           Final Clinical Impressions(s) / ED Diagnoses   Final diagnoses:  None    ED Discharge Orders    None       Tedd Sias, [place] 07/06/19 1456    Tedd Sias, [place] 07/06/19 1458    Tedd Sias, [place] 07/06/19 2005    Maudie Flakes, MD 07/07/19 [phone]

## 2019-07-06 NOTE — Discharge Instructions (Signed)
Return to ER, urgent care, or family doctor in 8-9 days. You have 7 sutures in your thumb.  Monitor wound for signs of infection.

## 2019-07-06 NOTE — ED Triage Notes (Signed)
Pt reports washing dishes when she cut her right thumb with a knife. Bleeding controlled. Needs tetanus updated.

## 2019-07-20 ENCOUNTER — Other Ambulatory Visit: Payer: Self-pay | Admitting: *Deleted

## 2019-07-20 DIAGNOSIS — Z20822 Contact with and (suspected) exposure to covid-19: Secondary | ICD-10-CM

## 2019-07-22 LAB — NOVEL CORONAVIRUS, NAA: SARS-CoV-2, NAA: NOT DETECTED

## 2019-08-23 ENCOUNTER — Other Ambulatory Visit: Payer: Self-pay

## 2019-08-23 ENCOUNTER — Ambulatory Visit (INDEPENDENT_AMBULATORY_CARE_PROVIDER_SITE_OTHER): Payer: No Typology Code available for payment source

## 2019-08-23 ENCOUNTER — Encounter: Payer: Self-pay | Admitting: Orthopaedic Surgery

## 2019-08-23 ENCOUNTER — Ambulatory Visit (INDEPENDENT_AMBULATORY_CARE_PROVIDER_SITE_OTHER): Payer: No Typology Code available for payment source | Admitting: Orthopaedic Surgery

## 2019-08-23 DIAGNOSIS — M25562 Pain in left knee: Secondary | ICD-10-CM | POA: Diagnosis not present

## 2019-08-23 DIAGNOSIS — G8929 Other chronic pain: Secondary | ICD-10-CM | POA: Diagnosis not present

## 2019-08-23 MED ORDER — TRAMADOL HCL 50 MG PO TABS: 50.0000 mg | ORAL_TABLET | Freq: Four times a day (QID) | ORAL | 1 refills | Status: DC | PRN

## 2019-08-23 NOTE — Progress Notes (Signed)
Office Visit Note   Patient: Theresa Cantrell           Date of Birth: 1976/06/22           MRN: [id number] Visit Date: 08/23/2019              Requested by: Lujean Amel, MD [address] [place],  [place] [address] PCP: Lujean Amel, MD   Assessment & Plan: Visit Diagnoses:  1. Chronic pain of left knee     Plan: Impression is questionable medial and lateral meniscus tears of the left knee.  Will obtain an [facility] MRI for further evaluation.  She will follow up with [place] once that has been completed.  Follow-Up Instructions: Return for after MRI.   Orders:  Orders Placed This Encounter  Procedures  . XR KNEE 3 VIEW LEFT   Meds ordered this encounter  Medications  . traMADol (ULTRAM) 50 MG tablet    Sig: Take 1 tablet (50 mg total) by mouth every 6 (six) hours as needed.    Dispense:  30 tablet    Refill:  1      Procedures: No procedures performed   Clinical Data: No additional findings.   Subjective: Chief Complaint  Patient presents with  . Left Knee - Pain    HPI patient is a pleasant 43 year old female who presents to our clinic today with left knee pain.  She is status post left patellofemoral replacement by Drs. Collins and [place] a few years back.  She was doing well until this past June when she started to sustain multiple falls due to pain.  She notes that she has had a total of 4 falls.  She was seen in the ER for this in June where x-rays were obtained.  Negative for fracture or acute findings.  She comes in today for further evaluation treatment recommendation.  Pain she has is to the medial and lateral joint lines.  She describes as a constant ache with occasional sharp shooting pains.  She also notes associated cramping to the lateral aspect of her knee.  Pain is worse with ambulation.  She has tried ice and heat as well as naproxen without relief of symptoms.  She denies any numbness, tingling or burning.  She does note that she had a  cortisone injection by Dr. Alfonso Ramus at [facility] orth[facility] few months back with only 2 days relief of symptoms.  Review of Systems as detailed in HPI.  All others reviewed and are negative.   Objective: Vital Signs: There were no vitals taken for this visit.  Physical Exam well-developed well-nourished female no acute distress.  Alert and oriented x3.  Ortho Exam examination of her left knee she is a fully healed surgical scar without complication.  She does have a keloid.  Range of motion of the knee from 0 to 95 degrees.  Marked tenderness medial lateral joint lines.  She is stable valgus varus stress.  She is neurovascularly intact distally.  Specialty Comments:  No specialty comments available.  Imaging: Xr Knee 3 View Left  Result Date: 08/23/2019 X-rays demonstrate joint space narrowing with periarticular osteophytes    PMFS History: Patient Active Problem List   Diagnosis Date Noted  . 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 Neuropathy 02/07/2014  . Hot flashes 02/07/2014  . Weight loss 02/07/2014  . Anxiety    Past Medical History:  Diagnosis Date  . Anxiety   . Arthritis   .  Hepatitis C     Family History  Problem Relation Age of Onset  . Hypertension Mother   . Cancer Mother        breast  . Anxiety disorder Mother   . Hypertension Father   . Schizophrenia Father   . Bipolar disorder Father   . Anxiety disorder Father     Past Surgical History:  Procedure Laterality Date  . ABDOMINAL HYSTERECTOMY    . CHOLECYSTECTOMY    . DILATION AND CURETTAGE OF UTERUS    . FOOT SURGERY Bilateral    two pins in each foot  . KNEE SURGERY     Social History   Occupational History  . Not on file  Tobacco Use  . Smoking status: Current Some Day Smoker    Packs/day: 0.25    Types: Cigarettes  . Smokeless tobacco: Never Used  Substance and Sexual Activity  . Alcohol use: Yes    Alcohol/week: 2.0 standard drinks    Types: 1 Glasses  of wine, 1 Cans of beer per week  . Drug use: No  . Sexual activity: Not Currently    Birth control/protection: Surgical

## 2019-08-24 ENCOUNTER — Telehealth: Payer: Self-pay | Admitting: Orthopaedic Surgery

## 2019-08-24 NOTE — Telephone Encounter (Signed)
Patient called advised she can not pick up the Rx (Tamadol) from [facility] on [address]. Patient asked if the Rx can be sent to the [facility] on [facility]   The number to contact patient is [phone]

## 2019-09-01 NOTE — Telephone Encounter (Signed)
I called patient and apologized for delayed message, explaining office had been closed to move. She was able to pick up tramadol from original pharmacy. While on the phone, she questioned why she had not heard from MRI scheduling and states that her doctor's note is going to run out. Looking at referral, it is stated that her insurance was terminated. Old insurance card was being used. Updated information on patient demographics sheet, but patient did not have card at time of appt to scan. Per Abigail Butts, we need copy of card. Patient is going to email to me or bring by early Monday morning. I did advise patient to let us know when and if she needed extended note due to delayed MRI scheduling. She will follow up.

## 2019-09-04 NOTE — Telephone Encounter (Signed)
Sending message to you as patient has not emailed me a copy of her card and will probably bring in to the office. Wanted to be sure you were aware she needs MRI scheduled once new card is in system and may need extended work note as she has not had MRI.

## 2019-09-07 ENCOUNTER — Telehealth: Payer: Self-pay | Admitting: Orthopaedic Surgery

## 2019-09-07 NOTE — Telephone Encounter (Signed)
I still do not see ne ins card on file.

## 2019-09-07 NOTE — Telephone Encounter (Signed)
Patient came into the clinic to request an updated work note because the other one expired on Wednesday, November 4.  She has not had her MRI yet.  Patient would also like an RX refill on her Tramadol.  CB#[phone].  Thank you.

## 2019-09-07 NOTE — Telephone Encounter (Signed)
Patient came in office and they scanned new ins card into Epic. Will you need a new MRI order or can you still use the one in Epic?

## 2019-09-07 NOTE — Telephone Encounter (Signed)
Please advise on note and Rx.

## 2019-09-08 ENCOUNTER — Other Ambulatory Visit: Payer: Self-pay | Admitting: Physician Assistant

## 2019-09-08 ENCOUNTER — Encounter: Payer: Self-pay | Admitting: Orthopaedic Surgery

## 2019-09-08 MED ORDER — TRAMADOL HCL 50 MG PO TABS: 50.0000 mg | ORAL_TABLET | Freq: Three times a day (TID) | ORAL | 0 refills | Status: DC | PRN

## 2019-09-08 NOTE — Telephone Encounter (Signed)
Spoke with patient and advised her the work note is ready for pick up and her Tramadol has been sent to her pharmacy.

## 2019-09-08 NOTE — Telephone Encounter (Signed)
I can use the same one, she is already scheduled for Nov 21

## 2019-09-08 NOTE — Telephone Encounter (Signed)
noted 

## 2019-09-08 NOTE — Telephone Encounter (Signed)
[place] for two more weeks.  Do you know why MRI is taking so long? As far as the tramadol, I went ahead and sent in

## 2019-09-08 NOTE — Telephone Encounter (Signed)
Pt is scheduled for 09/30/19 at [facility] imaging, she has been scheduled for a couple days

## 2019-09-08 NOTE — Telephone Encounter (Signed)
Any update on her MRI?

## 2019-09-11 ENCOUNTER — Other Ambulatory Visit: Payer: Self-pay

## 2019-09-11 ENCOUNTER — Ambulatory Visit: Payer: Self-pay | Admitting: Internal Medicine

## 2019-09-20 ENCOUNTER — Telehealth: Payer: Self-pay | Admitting: Orthopaedic Surgery

## 2019-09-20 NOTE — Telephone Encounter (Signed)
Patient called stating that she will need a note to return to work after appointment with Dr. Erlinda Hong on December 1st.  CB#[phone].  Thank you.

## 2019-09-20 NOTE — Telephone Encounter (Signed)
Patient called advised she is having the MRI 09/30/2019 and want to know if she will go back to work after her appointment on 10/03/2019. Patient said she will need a note in order to return to work. Patient said she will pick up note. The number to contact patient is [phone]

## 2019-09-20 NOTE — Telephone Encounter (Signed)
Ok to go back to work

## 2019-09-21 ENCOUNTER — Telehealth: Payer: Self-pay | Admitting: Orthopaedic Surgery

## 2019-09-21 NOTE — Telephone Encounter (Signed)
Called patient to discuss this. She would like to come in next appt 10/03/2019. She will discuss work status next appt. She will get new note 10/03/2019.

## 2019-09-21 NOTE — Telephone Encounter (Signed)
See other message

## 2019-09-21 NOTE — Telephone Encounter (Signed)
Pt called in checking on her work that she requested. Please give her a call [phone]

## 2019-09-25 ENCOUNTER — Other Ambulatory Visit: Payer: Self-pay

## 2019-09-25 DIAGNOSIS — F419 Anxiety disorder, unspecified: Secondary | ICD-10-CM

## 2019-09-26 ENCOUNTER — Telehealth: Payer: Self-pay | Admitting: Orthopaedic Surgery

## 2019-09-26 ENCOUNTER — Other Ambulatory Visit: Payer: Self-pay | Admitting: Physician Assistant

## 2019-09-26 MED ORDER — TRAMADOL HCL 50 MG PO TABS: 50.0000 mg | ORAL_TABLET | Freq: Three times a day (TID) | ORAL | 0 refills | Status: DC | PRN

## 2019-09-26 NOTE — Telephone Encounter (Signed)
Patient left a voicemail message requesting an RX refill on her Tramadol.  CB#[phone].  Thank you.

## 2019-09-26 NOTE — Telephone Encounter (Signed)
Sent in

## 2019-09-26 NOTE — Telephone Encounter (Signed)
If yes, please send into pharm. Thank you.   

## 2019-09-30 ENCOUNTER — Inpatient Hospital Stay: Admission: RE | Admit: 2019-09-30 | Payer: No Typology Code available for payment source | Source: Ambulatory Visit

## 2019-10-03 ENCOUNTER — Ambulatory Visit
Admission: RE | Admit: 2019-10-03 | Discharge: 2019-10-03 | Disposition: A | Discharge status: Home / Self Care | Payer: No Typology Code available for payment source | Source: Ambulatory Visit | Attending: Physician Assistant | Admitting: Physician Assistant

## 2019-10-03 ENCOUNTER — Other Ambulatory Visit: Payer: Self-pay

## 2019-10-03 ENCOUNTER — Ambulatory Visit: Payer: No Typology Code available for payment source | Admitting: Orthopaedic Surgery

## 2019-10-03 DIAGNOSIS — G8929 Other chronic pain: Secondary | ICD-10-CM

## 2019-10-03 DIAGNOSIS — M25562 Pain in left knee: Secondary | ICD-10-CM

## 2019-10-04 NOTE — Progress Notes (Signed)
F/u to discuss

## 2019-10-05 ENCOUNTER — Other Ambulatory Visit: Payer: Self-pay

## 2019-10-05 ENCOUNTER — Ambulatory Visit (INDEPENDENT_AMBULATORY_CARE_PROVIDER_SITE_OTHER): Payer: No Typology Code available for payment source | Admitting: Orthopaedic Surgery

## 2019-10-05 ENCOUNTER — Encounter: Payer: Self-pay | Admitting: Orthopaedic Surgery

## 2019-10-05 DIAGNOSIS — G8929 Other chronic pain: Secondary | ICD-10-CM

## 2019-10-05 DIAGNOSIS — M25562 Pain in left knee: Secondary | ICD-10-CM | POA: Diagnosis not present

## 2019-10-05 MED ORDER — DICLOFENAC SODIUM 1 % EX GEL: 2.0000 g | Freq: Four times a day (QID) | CUTANEOUS | 3 refills | Status: DC

## 2019-10-05 MED ORDER — AMITRIPTYLINE HCL 25 MG PO TABS: 25.0000 mg | ORAL_TABLET | Freq: Every day | ORAL | 2 refills | Status: DC

## 2019-10-05 NOTE — Progress Notes (Signed)
Office Visit Note   Patient: Theresa Cantrell           Date of Birth: [birth year]           MRN: [id number] Visit Date: 10/05/2019              Requested by: Lujean Amel, MD [address],  [place] [address] PCP: Mack Hook, MD   Assessment & Plan: Visit Diagnoses:  1. Chronic pain of left knee     Plan: Impression is chronic left knee pain status post mechanical fall.  Her MRI of the left knee is unremarkable for problems with the arthroplasty.  She shows some mild chondromalacia of the medial lateral compartments.  She does not have a joint effusion.  Her menisci appear intact.  I would like to try putting her on Elavil at night as well as Voltaren gel and physical therapy with modalities to see if this will calm it down.  We also discussed possibility of trying Visco injection if she does not get any relief.  Patient will follow-up in about 6 weeks if she continues to have no improvement. Total face to face encounter time was greater than 25 minutes and over half of this time was spent in counseling and/or coordination of care.  Follow-Up Instructions: Return if symptoms worsen or fail to improve.   Orders:  Orders Placed This Encounter  Procedures  . Ambulatory referral to Physical Therapy   Meds ordered this encounter  Medications  . amitriptyline (ELAVIL) 25 MG tablet    Sig: Take 1 tablet (25 mg total) by mouth at bedtime.    Dispense:  30 tablet    Refill:  2  . diclofenac Sodium (VOLTAREN) 1 % GEL    Sig: Apply 2 g topically 4 (four) times daily.    Dispense:  150 g    Refill:  3      Procedures: No procedures performed   Clinical Data: No additional findings.   Subjective: Chief Complaint  Patient presents with  . Left Knee - Follow-up    Theresa Cantrell returns today for MRI review of the left knee.  Briefly she underwent left patellofemoral arthroplasty and MPFL reconstruction in March 2019 by Dr. Ihor Gully and Dr. Theda Sers.  She  states that she did well until she had a fall in June of this year directly onto the lateral aspect of her left knee and leg.  Because of changing jobs and her insurance she has had to switch orthopedic doctors to [place].  She states that she did have an intra-articular cortisone injection Raliegh Ip sometime after they formally gave her about 2 weeks of relief.  She denies any mechanical symptoms.  She will walk occasionally with a cane.  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  She denies any mechanical symptoms.  She states that her pain is from the lateral aspect of her thigh down to the lateral aspect of her calf most severely in the lateral aspect of her knee.   Review of Systems   Objective: Vital Signs: There were no vitals taken for this visit.  Physical Exam  Ortho Exam Left knee exam shows a fully healed surgical scar.  She has good range of motion.  Patella tracking is slightly lateral.  There is no significant patellofemoral crepitus.  Collaterals and cruciates are stable.  She is quite tender on the lateral aspect of the knee.  She is also tender along the lateral aspect of the  calf and thigh. Specialty Comments:  No specialty comments available.  Imaging: No results found.   PMFS History: Patient Active Problem List   Diagnosis Date Noted  . Chronic pain of left knee 10/05/2019  . 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 Neuropathy 02/07/2014  . Hot flashes 02/07/2014  . Weight loss 02/07/2014  . Anxiety    Past Medical History:  Diagnosis Date  . Anxiety   . Arthritis   . Hepatitis C     Family History  Problem Relation Age of Onset  . Hypertension Mother   . Cancer Mother        breast  . Anxiety disorder Mother   . Hypertension Father   . Schizophrenia Father   . Bipolar disorder Father   . Anxiety disorder Father     Past Surgical History:  Procedure Laterality Date  . ABDOMINAL HYSTERECTOMY    . CHOLECYSTECTOMY    . DILATION AND  CURETTAGE OF UTERUS    . FOOT SURGERY Bilateral    two pins in each foot  . KNEE SURGERY     Social History   Occupational History  . Not on file  Tobacco Use  . Smoking status: Current Some Day Smoker    Packs/day: 0.25    Types: Cigarettes  . Smokeless tobacco: Never Used  Substance and Sexual Activity  . Alcohol use: Yes    Alcohol/week: 2.0 standard drinks    Types: 1 Glasses of wine, 1 Cans of beer per week  . Drug use: No  . Sexual activity: Not Currently    Birth control/protection: Surgical

## 2019-10-06 ENCOUNTER — Ambulatory Visit: Payer: Self-pay | Admitting: Internal Medicine

## 2019-11-16 ENCOUNTER — Other Ambulatory Visit: Payer: Self-pay

## 2020-05-16 ENCOUNTER — Ambulatory Visit (HOSPITAL_COMMUNITY): Payer: Self-pay | Admitting: Clinical

## 2020-06-12 ENCOUNTER — Ambulatory Visit (INDEPENDENT_AMBULATORY_CARE_PROVIDER_SITE_OTHER): Payer: No Payment, Other | Admitting: Psychiatry

## 2020-06-12 ENCOUNTER — Other Ambulatory Visit: Payer: Self-pay

## 2020-06-12 ENCOUNTER — Encounter (HOSPITAL_COMMUNITY): Payer: Self-pay | Admitting: Psychiatry

## 2020-06-12 DIAGNOSIS — F3181 Bipolar II disorder: Secondary | ICD-10-CM | POA: Insufficient documentation

## 2020-06-12 DIAGNOSIS — F4310 Post-traumatic stress disorder, unspecified: Secondary | ICD-10-CM | POA: Diagnosis not present

## 2020-06-12 MED ORDER — QUETIAPINE FUMARATE 100 MG PO TABS: 100.0000 mg | ORAL_TABLET | Freq: Every day | ORAL | 2 refills | Status: DC

## 2020-06-12 MED ORDER — PRAZOSIN HCL 1 MG PO CAPS: 1.0000 mg | ORAL_CAPSULE | Freq: Every day | ORAL | 2 refills | Status: DC

## 2020-06-12 MED ORDER — FLUOXETINE HCL 10 MG PO CAPS: 10.0000 mg | ORAL_CAPSULE | Freq: Every day | ORAL | 2 refills | Status: DC
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
## 2020-08-05 ENCOUNTER — Other Ambulatory Visit: Payer: Self-pay

## 2020-08-05 ENCOUNTER — Ambulatory Visit (INDEPENDENT_AMBULATORY_CARE_PROVIDER_SITE_OTHER): Payer: No Payment, Other | Admitting: Licensed Clinical Social Worker

## 2020-08-05 DIAGNOSIS — F3181 Bipolar II disorder: Secondary | ICD-10-CM
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
## 2020-08-12 ENCOUNTER — Other Ambulatory Visit: Payer: Self-pay

## 2020-08-12 ENCOUNTER — Ambulatory Visit (INDEPENDENT_AMBULATORY_CARE_PROVIDER_SITE_OTHER): Payer: No Payment, Other | Admitting: Psychiatry

## 2020-08-12 DIAGNOSIS — F4310 Post-traumatic stress disorder, unspecified: Secondary | ICD-10-CM

## 2020-08-12 DIAGNOSIS — F3181 Bipolar II disorder: Secondary | ICD-10-CM | POA: Diagnosis not present

## 2020-08-12 MED ORDER — PRAZOSIN HCL 1 MG PO CAPS: 1.0000 mg | ORAL_CAPSULE | Freq: Every day | ORAL | 2 refills | Status: DC

## 2020-08-12 MED ORDER — TRAZODONE HCL 50 MG PO TABS: 50.0000 mg | ORAL_TABLET | Freq: Every evening | ORAL | 2 refills | Status: DC | PRN

## 2020-08-12 MED ORDER — HYDROXYZINE HCL 10 MG PO TABS
10.0000 mg | ORAL_TABLET | Freq: Three times a day (TID) | ORAL | 2 refills | Status: DC | PRN
Start: 2020-08-12 — End: 2020-10-10

## 2020-08-12 MED ORDER — ARIPIPRAZOLE 5 MG PO TABS: 5.0000 mg | ORAL_TABLET | Freq: Every day | ORAL | 2 refills | Status: DC

## 2020-08-12 MED ORDER — FLUOXETINE HCL 20 MG PO CAPS: 20.0000 mg | ORAL_CAPSULE | Freq: Every day | ORAL | 2 refills | Status: DC

## 2020-08-12 NOTE — Progress Notes (Signed)
BH MD/PA/NP OP Progress Note      08/12/2020 4:50 PM HOORIA GASPARINI  MRN:  [id number]  Chief Complaint: " The Seroquel was over sedating and it made my hallucinations worse"  HPI: 44 year old female seen today for follow up psychiatric evaluation.  She has a psychiatric history of  Bipolar, depression, anxiety, and PTSD. She notes that she is currently not on any medications.  She is currently being managed on Seroquel 100 mg nightly, prazosin 1 mg nightly, and Prozac 10 mg daily.  Patient notes that she discontinued Seroquel because it was over sedating and exacerbated her hallucinations.  She notes that her other medications are somewhat effective in managing her psychiatric conditions.  On exam patient is pleasant, cooperative, calm, well groomed, engaged in conversation, and maintains eye contact.  She notes that her depression and anxiety has improved.  She quantifies her anxiety as 6 out of 10 and her depression as 7 out of 10 (10 being severe anxiety and depression).  She endorses depressive symptoms such as insomnia (noting she sleeps 4 to 6 hours a night), difficulty concentrating, hopelessness, and decreased energy.  At times she notes that her mood fluctuates, she is irritable, and distractible.  She denies SI/HI however endorses VAH.  She notes that she continues to see and hear her deceased brother and other deceased people.  She informed provider that the Seroquel exacerbated her hallucinations and notes she discontinued due to this reason.   Patient is agreeable to discontinuing Seroquel and starting Abilify 5 mg daily to help manage symptoms of psychosis.  She is also agreeable to increase Prozac 10 mg to 20 mg to help manage symptoms of anxiety and depression.  She will also start hydroxyzine 10 mg 3 times daily as needed for anxiety.  Patient is also agreeable to starting trazodone 25-50 mg as needed nightly to help manage sleep.  Potential side effects of medication and risks vs  benefits of treatment vs non-treatment were explained and discussed. All questions were answered.    She will follow-up with outpatient therapist for counseling.   No other concerns noted at this time. Visit Diagnosis:    ICD-10-CM   1. Bipolar 2 disorder, major depressive episode (HCC)  F31.81 FLUoxetine (PROZAC) 20 MG capsule    ARIPiprazole (ABILIFY) 5 MG tablet    traZODone (DESYREL) 50 MG tablet  2. PTSD (post-traumatic stress disorder)  F43.10 prazosin (MINIPRESS) 1 MG capsule    Past Psychiatric History:  Bipolar, depression, anxiety, and PTSD.  Past Medical History:  Past Medical History:  Diagnosis Date  . Anxiety   . Arthritis   . Hepatitis C     Past Surgical History:  Procedure Laterality Date  . ABDOMINAL HYSTERECTOMY    . CHOLECYSTECTOMY    . DILATION AND CURETTAGE OF UTERUS    . FOOT SURGERY Bilateral    two pins in each foot  . KNEE SURGERY      Family Psychiatric History: Mother Bipolar and depression, father schizophrenia and depression, bipolar.  Family History:  Family History  Problem Relation Age of Onset  . Hypertension Mother   . Cancer Mother        breast  . Anxiety disorder Mother   . Hypertension Father   . Schizophrenia Father   . Bipolar disorder Father   . Anxiety disorder Father     Social History:  Social History   Socioeconomic History  . Marital status: Single    Spouse name: Not  on file  . Number of children: Not on file  . Years of education: Not on file  . Highest education level: Not on file  Occupational History  . Not on file  Tobacco Use  . Smoking status: Current Some Day Smoker    Packs/day: 0.25    Types: Cigarettes  . Smokeless tobacco: Never Used  Vaping Use  . Vaping Use: Never used  Substance and Sexual Activity  . Alcohol use: Yes    Alcohol/week: 2.0 standard drinks    Types: 1 Glasses of wine, 1 Cans of beer per week  . Drug use: No  . Sexual activity: Not Currently    Birth control/protection:  Surgical  Other Topics Concern  . Not on file  Social History Narrative  . Not on file   Social Determinants of Health   Financial Resource Strain:   . Difficulty of Paying Living Expenses: Not on file  Food Insecurity:   . Worried About Charity fundraiser in the Last Year: Not on file  . Ran Out of Food in the Last Year: Not on file  Transportation Needs:   . Lack of Transportation (Medical): Not on file  . Lack of Transportation (Non-Medical): Not on file  Physical Activity:   . Days of Exercise per Week: Not on file  . Minutes of Exercise per Session: Not on file  Stress:   . Feeling of Stress : Not on file  Social Connections:   . Frequency of Communication with Friends and Family: Not on file  . Frequency of Social Gatherings with Friends and Family: Not on file  . Attends Religious Services: Not on file  . Active Member of Clubs or Organizations: Not on file  . Attends Archivist Meetings: Not on file  . Marital Status: Not on file    Allergies:  Allergies  Allergen Reactions  . Lamotrigine Hives    Metabolic Disorder Labs: Lab Results  Component Value Date   HGBA1C 5.5 03/31/2018   No results found for: PROLACTIN Lab Results  Component Value Date   CHOL 144 03/31/2018   TRIG 101 03/31/2018   HDL 61 03/31/2018   CHOLHDL 2.4 03/31/2018   LDLCALC 63 03/31/2018   Lab Results  Component Value Date   TSH 0.484 03/31/2018   TSH 1.501 02/07/2014    Therapeutic Level Labs: No results found for: LITHIUM No results found for: VALPROATE No components found for:  CBMZ  Current Medications: Current Outpatient Medications  Medication Sig Dispense Refill  . ALPRAZolam (XANAX) 0.5 MG tablet Take 1 tablet (0.5 mg total) by mouth 2 (two) times daily as needed for anxiety. (Patient not taking: Reported on 06/11/2019) 42 tablet 0  . ARIPiprazole (ABILIFY) 5 MG tablet Take 1 tablet (5 mg total) by mouth daily. 30 tablet 2  . diclofenac Sodium (VOLTAREN) 1 %  GEL Apply 2 g topically 4 (four) times daily. 150 g 3  . FLUoxetine (PROZAC) 20 MG capsule Take 1 capsule (20 mg total) by mouth daily. 30 capsule 2  . hydrOXYzine (ATARAX/VISTARIL) 10 MG tablet Take 1 tablet (10 mg total) by mouth 3 (three) times daily as needed. 90 tablet 2  . meloxicam (MOBIC) 15 MG tablet Take 1 tablet (15 mg total) by mouth daily as needed for pain. 15 tablet 0  . pantoprazole (PROTONIX) 20 MG tablet Take 1 tablet (20 mg total) by mouth daily. 30 tablet 1  . prazosin (MINIPRESS) 1 MG capsule Take 1 capsule (1  mg total) by mouth at bedtime. 30 capsule 2  . traMADol (ULTRAM) 50 MG tablet Take 1 tablet (50 mg total) by mouth 3 (three) times daily as needed. 30 tablet 0  . traZODone (DESYREL) 50 MG tablet Take 1 tablet (50 mg total) by mouth at bedtime as needed for sleep. 30 tablet 2   No current facility-administered medications for this visit.     Musculoskeletal: Strength & Muscle Tone: within normal limits Gait & Station: normal Patient leans: N/A  Psychiatric Specialty Exam: Review of Systems  There were no vitals taken for this visit.There is no height or weight on file to calculate BMI.  General Appearance: Well Groomed  Eye Contact:  Good  Speech:  Clear and Coherent and Normal Rate  Volume:  Normal  Mood:  Anxious and Depressed  Affect:  Congruent  Thought Process:  Coherent, Goal Directed and Linear  Orientation:  Full (Time, Place, and Person)  Thought Content: Logical and Hallucinations: Auditory Visual   Suicidal Thoughts:  No  Homicidal Thoughts:  No  Memory:  Immediate;   Good Recent;   Good Remote;   Good  Judgement:  Good  Insight:  Good  Psychomotor Activity:  Normal  Concentration:  Concentration: Good and Attention Span: Good  Recall:  Good  Fund of Knowledge: Good  Language: Good  Akathisia:  No  Handed:  Right  AIMS (if indicated): Not done  Assets:  Communication Skills Desire for Improvement Financial  Resources/Insurance Housing Social Support  ADL's:  Intact  Cognition: WNL  Sleep:  Poor   Screenings:   Assessment and Plan: Patient endorses symptoms of depression, anxiety, VAH, and insomnia.  She is agreeable to discontinuing Seroquel and starting Abilify 5 mg daily to help manage symptoms of psychosis.  She is also agreeable to increase Prozac 10 mg to 20 mg to help manage symptoms of anxiety and depression.  She will also start hydroxyzine 10 mg 3 times daily as needed for anxiety.  Patient is also agreeable to starting trazodone 25-50 mg as needed nightly to help manage sleep.  1. Bipolar 2 disorder, major depressive episode (HCC)  Increased- FLUoxetine (PROZAC) 20 MG capsule; Take 1 capsule (20 mg total) by mouth daily.  Dispense: 30 capsule; Refill: 2 Start- ARIPiprazole (ABILIFY) 5 MG tablet; Take 1 tablet (5 mg total) by mouth daily.  Dispense: 30 tablet; Refill: 2 Start- traZODone (DESYREL) 50 MG tablet; Take 1 tablet (50 mg total) by mouth at bedtime as needed for sleep.  Dispense: 30 tablet; Refill: 2  2. PTSD (post-traumatic stress disorder)  Continue- prazosin (MINIPRESS) 1 MG capsule; Take 1 capsule (1 mg total) by mouth at bedtime.  Dispense: 30 capsule; Refill: 2  Follow-up in 2 months Follow-up with therapy  Salley Slaughter, NP 08/12/2020, 4:50 PM

## 2020-09-05 ENCOUNTER — Ambulatory Visit: Payer: No Typology Code available for payment source | Attending: Family Medicine | Admitting: Family Medicine

## 2020-09-05 ENCOUNTER — Encounter: Payer: Self-pay | Admitting: Family Medicine

## 2020-09-05 ENCOUNTER — Other Ambulatory Visit: Payer: Self-pay

## 2020-09-05 VITALS — BP 130/86 | HR 80 | Wt 195.4 lb

## 2020-09-05 DIAGNOSIS — M25521 Pain in right elbow: Secondary | ICD-10-CM | POA: Diagnosis not present

## 2020-09-05 DIAGNOSIS — M25511 Pain in right shoulder: Secondary | ICD-10-CM

## 2020-09-05 DIAGNOSIS — R200 Anesthesia of skin: Secondary | ICD-10-CM

## 2020-09-05 DIAGNOSIS — K219 Gastro-esophageal reflux disease without esophagitis: Secondary | ICD-10-CM

## 2020-09-05 DIAGNOSIS — G8929 Other chronic pain: Secondary | ICD-10-CM

## 2020-09-05 DIAGNOSIS — R202 Paresthesia of skin: Secondary | ICD-10-CM

## 2020-09-05 MED ORDER — FAMOTIDINE 20 MG PO TABS: 20.0000 mg | ORAL_TABLET | Freq: Two times a day (BID) | ORAL | 2 refills | Status: DC

## 2020-09-05 MED ORDER — MELOXICAM 15 MG PO TABS: 15.0000 mg | ORAL_TABLET | Freq: Every day | ORAL | 3 refills | Status: DC | PRN

## 2020-09-05 NOTE — Progress Notes (Signed)
Subjective:  Patient ID: Theresa Cantrell, female    DOB: 11-20-1975  Age: 44 y.o. MRN: [id number]  CC: Establish Care   HPI Theresa Cantrell, 44 year old right handed female, who presents to the practice to establish care.  Patient has complaint of issues with right shoulder pain, right elbow pain and numbness and tingling in the right hand.  These issues have been occurring for greater than 6 months.  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  She feels that she will likely have an increase in her shoulder and elbow pain with use of a forklift controls.   Past Medical History:  Diagnosis Date  . Anxiety   . Arthritis   . Hepatitis C     Past Surgical History:  Procedure Laterality Date  . ABDOMINAL HYSTERECTOMY    . CHOLECYSTECTOMY    . DILATION AND CURETTAGE OF UTERUS    . FOOT SURGERY Bilateral    two pins in each foot  . KNEE SURGERY      Family History  Problem Relation Age of Onset  . Hypertension Mother   . Cancer Mother        breast  . Anxiety disorder Mother   . Hypertension Father   . Schizophrenia Father   . Bipolar disorder Father   . Anxiety disorder Father     Social History   Tobacco Use  . Smoking status: Current Some Day Smoker    Packs/day: 0.25    Types: Cigarettes  . Smokeless tobacco: Never Used  Substance Use Topics  . Alcohol use: Yes    Alcohol/week: 2.0 standard drinks    Types: 1 Glasses of wine, 1 Cans of beer per week    ROS Review of Systems  Constitutional: Positive for fatigue. Negative for chills and fever.  HENT: Negative for sore throat and trouble swallowing.   Respiratory: Negative for cough and shortness of breath.   Cardiovascular: Negative for chest pain and palpitations.  Gastrointestinal: Negative for abdominal pain, constipation, diarrhea and nausea.  Endocrine:  Negative for polydipsia, polyphagia and polyuria.  Genitourinary: Negative for dysuria and frequency.  Musculoskeletal: Positive for arthralgias and back pain.  Neurological: Positive for numbness. Negative for dizziness and headaches.  Hematological: Negative for adenopathy. Does not bruise/bleed easily.  Psychiatric/Behavioral: Negative for suicidal ideas. The patient is not nervous/anxious.     Objective:   Today's Vitals: BP 130/86 (BP Location: Left Arm, Patient Position: Sitting)   Pulse 80   Wt 195 lb 6.4 oz (88.6 kg)   SpO2 99%   BMI 32.52 kg/m   Physical Exam Vitals and nursing note reviewed.  Constitutional:      Appearance: Normal appearance.  Cardiovascular:     Rate and Rhythm: Normal rate and regular rhythm.  Pulmonary:     Effort: Pulmonary effort is normal.     Breath sounds: Normal breath sounds.  Abdominal:     Palpations: Abdomen is soft.     Tenderness: There is no abdominal tenderness. There is no right CVA tenderness, left CVA tenderness, guarding or rebound.  Musculoskeletal:     Cervical back: Normal range of motion and neck supple. No rigidity or tenderness.     Right lower leg: No edema.     Left lower leg: No edema.     Comments:  Patient with complaint of right shoulder pain with range of motion, negative empty can sign.  Patient with tenderness at the elbow.  Positive Tinel and  Negative Phalen at the wrists  Lymphadenopathy:     Cervical: No cervical adenopathy.  Skin:    General: Skin is warm and dry.  Neurological:     General: No focal deficit present.     Mental Status: She is alert and oriented to person, place, and time.  Psychiatric:        Mood and Affect: Mood normal.        Behavior: Behavior normal.     Assessment & Plan:  1. Chronic right shoulder pain; 2.  Right elbow pain; 3.  Numbness and tingling in the right hand Prescription provided for Mobic 15 mg to take once daily after eating as needed for pain.  She is encouraged to  apply for the [facility] financial discount program.  Referral to orthopedics for further evaluation and treatment of patient's chronic right shoulder pain, complaint of right elbow pain and complaint of numbness and tingling in the right hand. Educational material on carpal tunnel syndrome given as part of after visit summary. - meloxicam (MOBIC) 15 MG tablet; Take 1 tablet (15 mg total) by mouth daily as needed for pain. Take after eating  Dispense: 30 tablet; Refill: 3 - AMB referral to orthopedics  4. Gastroesophageal reflux disease without esophagitis Avoid known trigger foods.  Eat prior to taking nonsteroidal anti-inflammatories/Mobic.  Do not take any additional NSAIDs or Cox 2 inhibitors while on Mobic.  Prescription for Pepcid 20 mg twice daily to help reduce stomach acid. - famotidine (PEPCID) 20 MG tablet; Take 1 tablet (20 mg total) by mouth 2 (two) times daily. To reduce stomach acid  Dispense: 60 tablet; Refill: 2   Outpatient Encounter Medications as of 09/05/2020  Medication Sig  . ARIPiprazole (ABILIFY) 5 MG tablet Take 1 tablet (5 mg total) by mouth daily.  Marland Kitchen FLUoxetine (PROZAC) 20 MG capsule Take 1 capsule (20 mg total) by mouth daily.  . hydrOXYzine (ATARAX/VISTARIL) 10 MG tablet Take 1 tablet (10 mg total) by mouth 3 (three) times daily as needed.  . prazosin (MINIPRESS) 1 MG capsule Take 1 capsule (1 mg total) by mouth at bedtime.  . traZODone (DESYREL) 50 MG tablet Take 1 tablet (50 mg total) by mouth at bedtime as needed for sleep.  Marland Kitchen ALPRAZolam (XANAX) 0.5 MG tablet Take 1 tablet (0.5 mg total) by mouth 2 (two) times daily as needed for anxiety. (Patient not taking: Reported on 06/11/2019)  . diclofenac Sodium (VOLTAREN) 1 % GEL Apply 2 g topically 4 (four) times daily. (Patient not taking: Reported on 09/05/2020)  . famotidine (PEPCID) 20 MG tablet Take 1 tablet (20 mg total) by mouth 2 (two) times daily. To reduce stomach acid  . meloxicam (MOBIC) 15 MG tablet Take 1 tablet (15 mg  total) by mouth daily as needed for pain. Take after eating  . traMADol (ULTRAM) 50 MG tablet Take 1 tablet (50 mg total) by mouth 3 (three) times daily as needed. (Patient not taking: Reported on 09/05/2020)  . [DISCONTINUED] meloxicam (MOBIC) 15 MG tablet Take 1 tablet (15 mg total) by mouth daily as needed for pain. (Patient not taking: Reported on 09/05/2020)  . [DISCONTINUED] pantoprazole (PROTONIX) 20 MG tablet Take 1 tablet (20 mg total) by mouth daily. (Patient not taking: Reported on 09/05/2020)   No facility-administered encounter medications on file as of 09/05/2020.  Follow-up: Return for annual well exam at patient's conveinece and needs appt for [facility] discount.    Antony Blackbird MD

## 2020-09-05 NOTE — Patient Instructions (Signed)
Carpal Tunnel Syndrome  Carpal tunnel syndrome is a condition that causes pain in your hand and arm. The carpal tunnel is a narrow area that is on the palm side of your wrist. Repeated wrist motion or certain diseases may cause swelling in the tunnel. This swelling can pinch the main nerve in the wrist (median nerve). What are the causes? This condition may be caused by:  Repeated wrist motions.  Wrist injuries.  Arthritis.  A sac of fluid (cyst) or abnormal growth (tumor) in the carpal tunnel.  Fluid buildup during pregnancy. Sometimes the cause is not known. What increases the risk? The following factors may make you more likely to develop this condition:  Having a job in which you move your wrist in the same way many times. This includes jobs like being a butcher or a cashier.  Being a woman.  Having other health conditions, such as: ? Diabetes. ? Obesity. ? A thyroid gland that is not active enough (hypothyroidism). ? Kidney failure. What are the signs or symptoms? Symptoms of this condition include:  A tingling feeling in your fingers.  Tingling or a loss of feeling (numbness) in your hand.  Pain in your entire arm. This pain may get worse when you bend your wrist and elbow for a long time.  Pain in your wrist that goes up your arm to your shoulder.  Pain that goes down into your palm or fingers.  A weak feeling in your hands. You may find it hard to grab and hold items. You may feel worse at night. How is this diagnosed? This condition is diagnosed with a medical history and physical exam. You may also have tests, such as:  Electromyogram (EMG). This test checks the signals that the nerves send to the muscles.  Nerve conduction study. This test checks how well signals pass through your nerves.  Imaging tests, such as X-rays, ultrasound, and MRI. These tests check for what might be the cause of your condition. How is this treated? This condition may be treated  with:  Lifestyle changes. You will be asked to stop or change the activity that caused your problem.  Doing exercise and activities that make bones and muscles stronger (physical therapy).  Learning how to use your hand again (occupational therapy).  Medicines for pain and swelling (inflammation). You may have injections in your wrist.  A wrist splint.  Surgery. Follow these instructions at home: If you have a splint:  Wear the splint as told by your doctor. Remove it only as told by your doctor.  Loosen the splint if your fingers: ? Tingle. ? Lose feeling (become numb). ? Turn cold and blue.  Keep the splint clean.  If the splint is not waterproof: ? Do not let it get wet. ? Cover it with a watertight covering when you take a bath or a shower. Managing pain, stiffness, and swelling   If told, put ice on the painful area: ? If you have a removable splint, remove it as told by your doctor. ? Put ice in a plastic bag. ? Place a towel between your skin and the bag. ? Leave the ice on for 20 minutes, 2-3 times per day. General instructions  Take over-the-counter and prescription medicines only as told by your doctor.  Rest your wrist from any activity that may cause pain. If needed, talk with your boss at work about changes that can help your wrist heal.  Do any exercises as told by your doctor,   physical therapist, or occupational therapist.  Keep all follow-up visits as told by your doctor. This is important. Contact a doctor if:  You have new symptoms.  Medicine does not help your pain.  Your symptoms get worse. Get help right away if:  You have very bad numbness or tingling in your wrist or hand. Summary  Carpal tunnel syndrome is a condition that causes pain in your hand and arm.  It is often caused by repeated wrist motions.  Lifestyle changes and medicines are used to treat this problem. Surgery may help in very bad cases.  Follow your doctor's  instructions about wearing a splint, resting your wrist, keeping follow-up visits, and calling for help. This information is not intended to replace advice given to you by your health care provider. Make sure you discuss any questions you have with your health care provider. Document Revised: 02/25/2018 Document Reviewed: 02/25/2018 Elsevier Patient Education  2020 [employer]vier Inc.  

## 2020-09-05 NOTE — Progress Notes (Signed)
Right arm feels like tendonitis for 6 months Numbness in fingers  Hypertension issues

## 2020-09-17 ENCOUNTER — Ambulatory Visit: Payer: No Typology Code available for payment source | Admitting: Orthopaedic Surgery

## 2020-09-30 ENCOUNTER — Ambulatory Visit (HOSPITAL_COMMUNITY): Payer: No Payment, Other | Admitting: Licensed Clinical Social Worker

## 2020-10-04 ENCOUNTER — Encounter: Payer: No Typology Code available for payment source | Admitting: Family Medicine

## 2020-10-10 ENCOUNTER — Encounter (HOSPITAL_COMMUNITY): Payer: Self-pay | Admitting: Psychiatry

## 2020-10-10 ENCOUNTER — Ambulatory Visit (INDEPENDENT_AMBULATORY_CARE_PROVIDER_SITE_OTHER): Payer: No Payment, Other | Admitting: Psychiatry

## 2020-10-10 ENCOUNTER — Other Ambulatory Visit: Payer: Self-pay

## 2020-10-10 VITALS — BP 144/96 | HR 78 | Ht 65.0 in | Wt 198.0 lb

## 2020-10-10 DIAGNOSIS — F4310 Post-traumatic stress disorder, unspecified: Secondary | ICD-10-CM

## 2020-10-10 DIAGNOSIS — F3181 Bipolar II disorder: Secondary | ICD-10-CM

## 2020-10-10 DIAGNOSIS — F419 Anxiety disorder, unspecified: Secondary | ICD-10-CM

## 2020-10-10 MED ORDER — HYDROXYZINE HCL 10 MG PO TABS: 10.0000 mg | ORAL_TABLET | Freq: Three times a day (TID) | ORAL | 2 refills | Status: DC | PRN

## 2020-10-10 MED ORDER — TRAZODONE HCL 50 MG PO TABS: 50.0000 mg | ORAL_TABLET | Freq: Every evening | ORAL | 2 refills | Status: DC | PRN

## 2020-10-10 MED ORDER — FLUOXETINE HCL 20 MG PO CAPS: 20.0000 mg | ORAL_CAPSULE | Freq: Every day | ORAL | 2 refills | Status: DC

## 2020-10-10 MED ORDER — PRAZOSIN HCL 1 MG PO CAPS: 1.0000 mg | ORAL_CAPSULE | Freq: Every day | ORAL | 2 refills | Status: DC

## 2020-10-10 MED ORDER — ARIPIPRAZOLE 5 MG PO TABS: 5.0000 mg | ORAL_TABLET | Freq: Every day | ORAL | 2 refills | Status: DC

## 2020-10-10 NOTE — Progress Notes (Signed)
BH MD/PA/NP OP Progress Note      10/10/2020 3:41 PM Theresa Cantrell  MRN:  [id number]  Chief Complaint: " Things are a whole lot better" Chief Complaint    Medication Management      HPI: 44 year old female seen today for follow up psychiatric evaluation.  She has a psychiatric history of  Bipolar, depression, anxiety, and PTSD.  She is currently being managed on Abilify 5 mg daily, prazosin 1 mg nightly, trazodone 25 mg to 50 mg as needed sleep, hydroxyzine 10 mg three times daily, and Prozac 20 mg daily.  Patient notes that her medications are  effective in managing her psychiatric conditions.  Today she is is pleasant, cooperative, calm, well groomed, engaged in conversation, and maintains eye contact.  She informed provider that her anxiety and depression has improved since her last visit.  Provider conducted a GAD-7 and patient scored a 12.  She notes at times she worries about finances and her health.  She informed provider that she recently found out that she has carpal tunnel and will need to see an orthopedic doctor for further evaluation.  Provider also conducted a PHQ-9 and patient scored a 10.  She informed Probation officer that since starting trazodone her sleep has improved and notes that prazosin helps manage her nightmares.  She denies SI/HI/VAH, mania, or paranoia.   Patient informed Probation officer that she recently started a new job at [employer].  She informed provider that she enjoys her job and notes that she is hopeful that she will be able to continue with.   No medication changes made today.  Patient agreeable to continue medications as prescribed.  She will follow-up with outpatient therapist for counseling.   No other concerns noted at this time.  Visit Diagnosis:    ICD-10-CM   1. Anxiety  F41.9 hydrOXYzine (ATARAX/VISTARIL) 10 MG tablet  2. Bipolar 2 disorder, major depressive episode (HCC)  F31.81 ARIPiprazole (ABILIFY) 5 MG tablet    FLUoxetine  (PROZAC) 20 MG capsule    traZODone (DESYREL) 50 MG tablet  3. PTSD (post-traumatic stress disorder)  F43.10 prazosin (MINIPRESS) 1 MG capsule    Past Psychiatric History:  Bipolar, depression, anxiety, and PTSD.  Past Medical History:  Past Medical History:  Diagnosis Date  . Anxiety   . Arthritis   . Hepatitis C     Past Surgical History:  Procedure Laterality Date  . ABDOMINAL HYSTERECTOMY    . CHOLECYSTECTOMY    . DILATION AND CURETTAGE OF UTERUS    . FOOT SURGERY Bilateral    two pins in each foot  . KNEE SURGERY      Family Psychiatric History: Mother Bipolar and depression, father schizophrenia and depression, bipolar.  Family History:  Family History  Problem Relation Age of Onset  . Hypertension Mother   . Cancer Mother        breast  . Anxiety disorder Mother   . Hypertension Father   . Schizophrenia Father   . Bipolar disorder Father   . Anxiety disorder Father     Social History:  Social History   Socioeconomic History  . Marital status: Single    Spouse name: Not on file  . Number of children: Not on file  . Years of education: Not on file  . Highest education level: Not on file  Occupational History  . Not on file  Tobacco Use  . Smoking status: Current Some Day Smoker    Packs/day: 0.25  Types: Cigarettes  . Smokeless tobacco: Never Used  Vaping Use  . Vaping Use: Never used  Substance and Sexual Activity  . Alcohol use: Yes    Alcohol/week: 2.0 standard drinks    Types: 1 Glasses of wine, 1 Cans of beer per week  . Drug use: No  . Sexual activity: Not Currently    Birth control/protection: Surgical  Other Topics Concern  . Not on file  Social History Narrative  . Not on file   Social Determinants of Health   Financial Resource Strain: Not on file  Food Insecurity: Not on file  Transportation Needs: Not on file  Physical Activity: Not on file  Stress: Not on file  Social Connections: Not on file    Allergies:  Allergies   Allergen Reactions  . Lamotrigine Hives    Metabolic Disorder Labs: Lab Results  Component Value Date   HGBA1C 5.5 03/31/2018   No results found for: PROLACTIN Lab Results  Component Value Date   CHOL 144 03/31/2018   TRIG 101 03/31/2018   HDL 61 03/31/2018   CHOLHDL 2.4 03/31/2018   LDLCALC 63 03/31/2018   Lab Results  Component Value Date   TSH 0.484 03/31/2018   TSH 1.501 02/07/2014    Therapeutic Level Labs: No results found for: LITHIUM No results found for: VALPROATE No components found for:  CBMZ  Current Medications: Current Outpatient Medications  Medication Sig Dispense Refill  . ARIPiprazole (ABILIFY) 5 MG tablet Take 1 tablet (5 mg total) by mouth daily. 30 tablet 2  . diclofenac Sodium (VOLTAREN) 1 % GEL Apply 2 g topically 4 (four) times daily. (Patient not taking: Reported on 09/05/2020) 150 g 3  . famotidine (PEPCID) 20 MG tablet Take 1 tablet (20 mg total) by mouth 2 (two) times daily. To reduce stomach acid 60 tablet 2  . FLUoxetine (PROZAC) 20 MG capsule Take 1 capsule (20 mg total) by mouth daily. 30 capsule 2  . hydrOXYzine (ATARAX/VISTARIL) 10 MG tablet Take 1 tablet (10 mg total) by mouth 3 (three) times daily as needed. 90 tablet 2  . prazosin (MINIPRESS) 1 MG capsule Take 1 capsule (1 mg total) by mouth at bedtime. 30 capsule 2  . traZODone (DESYREL) 50 MG tablet Take 1 tablet (50 mg total) by mouth at bedtime as needed for sleep. 30 tablet 2   No current facility-administered medications for this visit.     Musculoskeletal: Strength & Muscle Tone: within normal limits Gait & Station: normal Patient leans: N/A  Psychiatric Specialty Exam: Review of Systems  Blood pressure (!) 144/96, pulse 78, height 5\' 5"  (1.651 m), weight 198 lb (89.8 kg), SpO2 99 %.Body mass index is 32.95 kg/m.  General Appearance: Well Groomed  Eye Contact:  Good  Speech:  Clear and Coherent and Normal Rate  Volume:  Normal  Mood:  Euthymic and Notes that she  occasionally has anxiety and depression however reports that it has improved  Affect:  Congruent  Thought Process:  Coherent, Goal Directed and Linear  Orientation:  Full (Time, Place, and Person)  Thought Content: WDL and Logical   Suicidal Thoughts:  No  Homicidal Thoughts:  No  Memory:  Immediate;   Good Recent;   Good Remote;   Good  Judgement:  Good  Insight:  Good  Psychomotor Activity:  Normal  Concentration:  Concentration: Good and Attention Span: Good  Recall:  Good  Fund of Knowledge: Good  Language: Good  Akathisia:  No  Handed:  Right  AIMS (if indicated): Not done  Assets:  Communication Skills Desire for Improvement Financial Resources/Insurance Housing Social Support  ADL's:  Intact  Cognition: WNL  Sleep:  Good   Screenings: GAD-7   Flowsheet Row Clinical Support from 10/10/2020 in [facility]  Total GAD-7 Score 12    PHQ2-9   Flowsheet Row Clinical Support from 10/10/2020 in [facility] Office Visit from 09/05/2020 in [place]  PHQ-2 Total Score 5 0  PHQ-9 Total Score 10 --       Assessment and Plan: Patient reports that she is doing well on her current medication regimen.  She notes that her anxiety, depression, and hallucinations have improved.  No medication changes made today.  Patient agreeable to continue medication as prescribed. 1. Bipolar 2 disorder, major depressive episode (HCC)  Increased- FLUoxetine (PROZAC) 20 MG capsule; Take 1 capsule (20 mg total) by mouth daily.  Dispense: 30 capsule; Refill: 2 Start- ARIPiprazole (ABILIFY) 5 MG tablet; Take 1 tablet (5 mg total) by mouth daily.  Dispense: 30 tablet; Refill: 2 Start- traZODone (DESYREL) 50 MG tablet; Take 1 tablet (50 mg total) by mouth at bedtime as needed for sleep.  Dispense: 30 tablet; Refill: 2  2. PTSD (post-traumatic stress disorder)  Continue- prazosin (MINIPRESS) 1 MG capsule; Take 1  capsule (1 mg total) by mouth at bedtime.  Dispense: 30 capsule; Refill: 2  Follow-up in 2 months Follow-up with therapy  Salley Slaughter, NP 10/10/2020, 3:41 PM

## 2020-10-14 ENCOUNTER — Ambulatory Visit (HOSPITAL_COMMUNITY): Payer: Self-pay | Admitting: Licensed Clinical Social Worker

## 2020-10-15 ENCOUNTER — Ambulatory Visit (INDEPENDENT_AMBULATORY_CARE_PROVIDER_SITE_OTHER): Payer: PRIVATE HEALTH INSURANCE | Admitting: Orthopaedic Surgery

## 2020-10-15 ENCOUNTER — Ambulatory Visit: Payer: Self-pay

## 2020-10-15 ENCOUNTER — Ambulatory Visit (INDEPENDENT_AMBULATORY_CARE_PROVIDER_SITE_OTHER): Payer: PRIVATE HEALTH INSURANCE

## 2020-10-15 DIAGNOSIS — G8929 Other chronic pain: Secondary | ICD-10-CM | POA: Diagnosis not present

## 2020-10-15 DIAGNOSIS — G5601 Carpal tunnel syndrome, right upper limb: Secondary | ICD-10-CM

## 2020-10-15 DIAGNOSIS — M25511 Pain in right shoulder: Secondary | ICD-10-CM

## 2020-10-15 MED ORDER — DICLOFENAC SODIUM 75 MG PO TBEC: 75.0000 mg | DELAYED_RELEASE_TABLET | Freq: Two times a day (BID) | ORAL | 2 refills | Status: DC | PRN

## 2020-10-15 NOTE — Progress Notes (Signed)
Office Visit Note   Patient: Theresa Cantrell           Date of Birth: February 21, 1976           MRN: [id number] Visit Date: 10/15/2020              Requested by: Antony Blackbird, MD [place],  [place] [address] PCP: Antony Blackbird, MD   Assessment & Plan: Visit Diagnoses:  1. Chronic right shoulder pain   2. Carpal tunnel syndrome, right upper limb     Plan: Impression is right shoulder symptomatic AC joint and right hand carpal tunnel syndrome.  In regards to her right shoulder, we have referred her to Dr. Junius Roads for [facility] joint cortisone injection under ultrasound.  In regards to the right hand, we have provided her with a removable wrist splint to wear at night.  We have also referred her to Dr. Ernestina Patches for right upper extremity nerve conduction study.  She will follow up with [place] once has been completed.  Call with concerns or questions in the meantime.  Follow-Up Instructions: Return for after NCS/EMG.   Orders:  Orders Placed This Encounter  Procedures  . XR Shoulder Right  . US Guided Needle Placement - No Linked Charges  . Ambulatory referral to Physical Medicine Rehab   Meds ordered this encounter  Medications  . diclofenac (VOLTAREN) 75 MG EC tablet    Sig: Take 1 tablet (75 mg total) by mouth 2 (two) times daily as needed.    Dispense:  60 tablet    Refill:  2      Procedures: No procedures performed   Clinical Data: No additional findings.   Subjective: Chief Complaint  Patient presents with  . Right Shoulder - Pain    HPI patient is a very pleasant 44 year old female who comes in today with right shoulder pain as well as right hand paresthesias.  In regards to her shoulder, she has had pain here for the past 2 months without any known injury or change in activity.  The pain is primarily to the top of her shoulder and occasionally radiates into the deltoid.  Pain is worse at night when she is trying to sleep as well as with forward flexion and  internal rotation.  She is also complaining of pain from her right elbow into the right hand for the past 6 months.  She notices numbness, tingling and burning to the thumb, index and long fingers.  She does quickly have to shake her hand due to her paresthesias.  She has not tried a wrist splint.  Review of Systems as detailed in HPI.  All others reviewed and are negative.   Objective: Vital Signs: There were no vitals taken for this visit.  Physical Exam well-developed well-nourished female no acute distress.  Alert oriented x3.  Ortho Exam right shoulder exam shows full active range of motion in all planes.  Mildly positive empty can test.  She does have a positive cross body adduction.  Marked tenderness to the [facility] joint.  Full strength throughout.  Elbow is nontender.  She has full range of motion with flexion, extension, supination pronation.  She does have a positive Phalen and positive Tinel at the wrist.  No thenar atrophy.  She is neurovascular intact distally.  Specialty Comments:  No specialty comments available.  Imaging: US Guided Needle Placement - No Linked Charges  Result Date: 10/15/2020 Ultrasound-guided AC joint injection: After sterile prep with Betadine, injected  3 cc 0.25% bupivacaine and 6 mg betamethasone without complication.  XR Shoulder Right  Result Date: 10/15/2020 No acute or structural abnormalities    PMFS History: Patient Active Problem List   Diagnosis Date Noted  . Bipolar 2 disorder, major depressive episode ([place]) 06/12/2020  . PTSD (post-traumatic stress disorder) 06/12/2020  . Chronic pain of left knee 10/05/2019  . 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 Neuropathy 02/07/2014  . Hot flashes 02/07/2014  . Weight loss 02/07/2014  . Anxiety    Past Medical History:  Diagnosis Date  . Anxiety   . Arthritis   . Hepatitis C     Family History  Problem Relation Age of Onset  . Hypertension Mother   . Cancer  Mother        breast  . Anxiety disorder Mother   . Hypertension Father   . Schizophrenia Father   . Bipolar disorder Father   . Anxiety disorder Father     Past Surgical History:  Procedure Laterality Date  . ABDOMINAL HYSTERECTOMY    . CHOLECYSTECTOMY    . DILATION AND CURETTAGE OF UTERUS    . FOOT SURGERY Bilateral    two pins in each foot  . KNEE SURGERY     Social History   Occupational History  . Not on file  Tobacco Use  . Smoking status: Current Some Day Smoker    Packs/day: 0.25    Types: Cigarettes  . Smokeless tobacco: Never Used  Vaping Use  . Vaping Use: Never used  Substance and Sexual Activity  . Alcohol use: Yes    Alcohol/week: 2.0 standard drinks    Types: 1 Glasses of wine, 1 Cans of beer per week  . Drug use: No  . Sexual activity: Not Currently    Birth control/protection: Surgical

## 2020-10-15 NOTE — Progress Notes (Signed)
Subjective: She is here for right AC joint injection under ultrasound guidance.  Objective: She is point tender over the [facility] joint.  Procedure: Ultrasound-guided AC joint injection: After sterile prep with Betadine, injected 3 cc 0.25% bupivacaine and 6 mg betamethasone without complication.

## 2020-11-07 ENCOUNTER — Ambulatory Visit: Payer: No Typology Code available for payment source | Attending: Internal Medicine | Admitting: Internal Medicine

## 2020-11-07 ENCOUNTER — Other Ambulatory Visit: Payer: Self-pay

## 2020-11-07 ENCOUNTER — Encounter: Payer: Self-pay | Admitting: Internal Medicine

## 2020-11-07 VITALS — BP 142/90 | HR 77 | Temp 98.4°F | Resp 16 | Ht 64.5 in | Wt 196.6 lb

## 2020-11-07 DIAGNOSIS — E669 Obesity, unspecified: Secondary | ICD-10-CM | POA: Diagnosis not present

## 2020-11-07 DIAGNOSIS — F4310 Post-traumatic stress disorder, unspecified: Secondary | ICD-10-CM

## 2020-11-07 DIAGNOSIS — Z2821 Immunization not carried out because of patient refusal: Secondary | ICD-10-CM | POA: Insufficient documentation

## 2020-11-07 DIAGNOSIS — F3181 Bipolar II disorder: Secondary | ICD-10-CM

## 2020-11-07 DIAGNOSIS — E66811 Obesity, class 1: Secondary | ICD-10-CM

## 2020-11-07 DIAGNOSIS — I10 Essential (primary) hypertension: Secondary | ICD-10-CM | POA: Diagnosis not present

## 2020-11-07 DIAGNOSIS — Z0000 Encounter for general adult medical examination without abnormal findings: Secondary | ICD-10-CM | POA: Diagnosis not present

## 2020-11-07 DIAGNOSIS — Z8659 Personal history of other mental and behavioral disorders: Secondary | ICD-10-CM | POA: Insufficient documentation

## 2020-11-07 DIAGNOSIS — K219 Gastro-esophageal reflux disease without esophagitis: Secondary | ICD-10-CM | POA: Diagnosis not present

## 2020-11-07 DIAGNOSIS — Z8742 Personal history of other diseases of the female genital tract: Secondary | ICD-10-CM

## 2020-11-07 DIAGNOSIS — Z803 Family history of malignant neoplasm of breast: Secondary | ICD-10-CM

## 2020-11-07 DIAGNOSIS — Z1231 Encounter for screening mammogram for malignant neoplasm of breast: Secondary | ICD-10-CM

## 2020-11-07 DIAGNOSIS — Z1211 Encounter for screening for malignant neoplasm of colon: Secondary | ICD-10-CM

## 2020-11-07 DIAGNOSIS — F17200 Nicotine dependence, unspecified, uncomplicated: Secondary | ICD-10-CM

## 2020-11-07 MED ORDER — NICOTINE 14 MG/24HR TD PT24: 14.0000 mg | MEDICATED_PATCH | Freq: Every day | TRANSDERMAL | 1 refills | Status: DC

## 2020-11-07 MED ORDER — AMLODIPINE BESYLATE 5 MG PO TABS: 5.0000 mg | ORAL_TABLET | Freq: Every day | ORAL | 3 refills | Status: DC

## 2020-11-07 NOTE — Patient Instructions (Signed)
Healthy Eating Following a healthy eating pattern may help you to achieve and maintain a healthy body weight, reduce the risk of chronic disease, and live a long and productive life. It is important to follow a healthy eating pattern at an appropriate calorie level for your body. Your nutritional needs should be met primarily through food by choosing a variety of nutrient-rich foods. What are tips for following this plan? Reading food labels  Read labels and choose the following: ? Reduced or low sodium. ? Juices with 100% fruit juice. ? Foods with low saturated fats and high polyunsaturated and monounsaturated fats. ? Foods with whole grains, such as whole wheat, cracked wheat, brown rice, and wild rice. ? Whole grains that are fortified with folic acid. This is recommended for women who are pregnant or who want to become pregnant.  Read labels and avoid the following: ? Foods with a lot of added sugars. These include foods that contain brown sugar, corn sweetener, corn syrup, dextrose, fructose, glucose, high-fructose corn syrup, honey, invert sugar, lactose, malt syrup, maltose, molasses, raw sugar, sucrose, trehalose, or turbinado sugar.  Do not eat more than the following amounts of added sugar per day:  6 teaspoons (25 g) for women.  9 teaspoons (38 g) for men. ? Foods that contain processed or refined starches and grains. ? Refined grain products, such as white flour, degermed cornmeal, white bread, and white rice. Shopping  Choose nutrient-rich snacks, such as vegetables, whole fruits, and nuts. Avoid high-calorie and high-sugar snacks, such as potato chips, fruit snacks, and candy.  Use oil-based dressings and spreads on foods instead of solid fats such as butter, stick margarine, or cream cheese.  Limit pre-made sauces, mixes, and "instant" products such as flavored rice, instant noodles, and ready-made pasta.  Try more plant-protein sources, such as tofu, tempeh, black beans,  edamame, lentils, nuts, and seeds.  Explore eating plans such as the Mediterranean diet or vegetarian diet. Cooking  Use oil to saut or stir-fry foods instead of solid fats such as butter, stick margarine, or lard.  Try baking, boiling, grilling, or broiling instead of frying.  Remove the fatty part of meats before cooking.  Steam vegetables in water or broth. Meal planning   At meals, imagine dividing your plate into fourths: ? One-half of your plate is fruits and vegetables. ? One-fourth of your plate is whole grains. ? One-fourth of your plate is protein, especially lean meats, poultry, eggs, tofu, beans, or nuts.  Include low-fat dairy as part of your daily diet. Lifestyle  Choose healthy options in all settings, including home, work, school, restaurants, or stores.  Prepare your food safely: ? Wash your hands after handling raw meats. ? Keep food preparation surfaces clean by regularly washing with hot, soapy water. ? Keep raw meats separate from ready-to-eat foods, such as fruits and vegetables. ? Cook seafood, meat, poultry, and eggs to the recommended internal temperature. ? Store foods at safe temperatures. In general:  Keep cold foods at 59F (4.4C) or below.  Keep hot foods at 159F (60C) or above.  Keep your freezer at [facility] (-17.8C) or below.  Foods are no longer safe to eat when they have been between the temperatures of 40-159F (4.4-60C) for more than 2 hours. What foods should I eat? Fruits Aim to eat 2 cup-equivalents of fresh, canned (in natural juice), or frozen fruits each day. Examples of 1 cup-equivalent of fruit include 1 small apple, 8 large strawberries, 1 cup canned fruit,  cup  dried fruit, or 1 cup 100% juice. Vegetables Aim to eat 2-3 cup-equivalents of fresh and frozen vegetables each day, including different varieties and colors. Examples of 1 cup-equivalent of vegetables include 2 medium carrots, 2 cups raw, leafy greens, 1 cup chopped  vegetable (raw or cooked), or 1 medium baked potato. Grains Aim to eat 6 ounce-equivalents of whole grains each day. Examples of 1 ounce-equivalent of grains include 1 slice of bread, 1 cup ready-to-eat cereal, 3 cups popcorn, or  cup cooked rice, pasta, or cereal. Meats and other proteins Aim to eat 5-6 ounce-equivalents of protein each day. Examples of 1 ounce-equivalent of protein include 1 egg, 1/2 cup nuts or seeds, or 1 tablespoon (16 g) peanut butter. A cut of meat or fish that is the size of a deck of cards is about 3-4 ounce-equivalents.  Of the protein you eat each week, try to have at least 8 ounces come from seafood. This includes salmon, trout, herring, and anchovies. Dairy Aim to eat 3 cup-equivalents of fat-free or low-fat dairy each day. Examples of 1 cup-equivalent of dairy include 1 cup (240 mL) milk, 8 ounces (250 g) yogurt, 1 ounces (44 g) natural cheese, or 1 cup (240 mL) fortified soy milk. Fats and oils  Aim for about 5 teaspoons (21 g) per day. Choose monounsaturated fats, such as canola and olive oils, avocados, peanut butter, and most nuts, or polyunsaturated fats, such as sunflower, corn, and soybean oils, walnuts, pine nuts, sesame seeds, sunflower seeds, and flaxseed. Beverages  Aim for six 8-oz glasses of water per day. Limit coffee to three to five 8-oz cups per day.  Limit caffeinated beverages that have added calories, such as soda and energy drinks.  Limit alcohol intake to no more than 1 drink a day for nonpregnant women and 2 drinks a day for men. One drink equals 12 oz of beer (355 mL), 5 oz of wine (148 mL), or 1 oz of hard liquor (44 mL). Seasoning and other foods  Avoid adding excess amounts of salt to your foods. Try flavoring foods with herbs and spices instead of salt.  Avoid adding sugar to foods.  Try using oil-based dressings, sauces, and spreads instead of solid fats. This information is based on general [place] nutrition guidelines. For more  information, visit [employer]. Exact amounts may vary based on your nutrition needs. Summary  A healthy eating plan may help you to maintain a healthy weight, reduce the risk of chronic diseases, and stay active throughout your life.  Plan your meals. Make sure you eat the right portions of a variety of nutrient-rich foods.  Try baking, boiling, grilling, or broiling instead of frying.  Choose healthy options in all settings, including home, work, school, restaurants, or stores. This information is not intended to replace advice given to you by your health care provider. Make sure you discuss any questions you have with your health care provider. Document Revised: 01/31/2018 Document Reviewed: 01/31/2018 Elsevier Patient Education  [place].

## 2020-11-07 NOTE — Progress Notes (Signed)
Patient ID: Theresa Cantrell, female    DOB: 09/30/1976  MRN: [id number]  CC: Annual Exam and re-establish   Subjective: Theresa Cantrell is a 45 y.o. female who presents for re-est care and physical.  She had seen Dr. Chapman Fitch back in November but she is no longer with this practice. Her concerns today include:  Patient with history of GERD, PTSD, Bipolar 2, tob dep  GERD: On recent visit with Dr. Chapman Fitch, she was having GERD symptoms.  Started on Pepcid.  She reports she is doing well on this medication   On last visit with Dr. Chapman Fitch she had complained of some chronic right shoulder pain, right elbow pain and numbness and tingling in the right hand.  She was referred to orthopedics Dr. Cooper Render was assessed to have symptomatic AC joint and right hand carpal tunnel syndrome.  She was referred to Dr. Junius Roads who did an injection to the [facility] joint with good results.  She has EMG scheduled for next week   Tob dep: 2-3 cigarettes a day from 1/2 pk.  Smoked since age 68. Trying to quit.  Hope to quit by end of week.  Would like to try the patches Drinks ETOH occasionally, no street drugs  PTSD/Bipolar: She is plugged into mental health services.  She is on several psychiatric medications and reports that she is doing well.   BP elev today.  Reports being dx in past and treatment was advised but no insurance at time -she limits salt and limits intake of pork Denies any chest pains or shortness of breath.  No lower extremity edema.  Obesity: trying to get healthier. She has eliminated fried foods.  She mainly bakes or use her air Rolly Salter for her meats.   Drinks juice, water and sweet tea Not too much exercise.  LT knee bothers at time.  She has had a few surgeries on this knee starting from when she had dislocated it in 2001.  She subsequently had partial knee replacement in 2018.    HM:  Last MMG 2019.  Mom had breast Ca at age 4.  Not sure if check for cancer gene.  We discussed colon cancer screening  being now recommended for age 78 or older for people at average risk for colon cancer.  Last Pap smear was in 2019 was abnormal.  Saw GYN who did colpo which came back okay.  Plan was for repeat Pap smear in 1 year but she did not follow-up at that time due to lack of insurance.  Past medical, surgical, social history reviewed  Patient Active Problem List   Diagnosis Date Noted  . Influenza vaccine refused 11/07/2020  . Bipolar 2 disorder, major depressive episode ([place]) 06/12/2020  . PTSD (post-traumatic stress disorder) 06/12/2020  . Chronic pain of left knee 10/05/2019  . 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 Neuropathy 02/07/2014  . Hot flashes 02/07/2014  . Weight loss 02/07/2014  . Anxiety      Current Outpatient Medications on File Prior to Visit  Medication Sig Dispense Refill  . ARIPiprazole (ABILIFY) 5 MG tablet Take 1 tablet (5 mg total) by mouth daily. 30 tablet 2  . diclofenac (VOLTAREN) 75 MG EC tablet Take 1 tablet (75 mg total) by mouth 2 (two) times daily as needed. 60 tablet 2  . diclofenac Sodium (VOLTAREN) 1 % GEL Apply 2 g topically 4 (four) times daily. (Patient not taking: Reported on 09/05/2020) 150 g 3  . famotidine (  PEPCID) 20 MG tablet Take 1 tablet (20 mg total) by mouth 2 (two) times daily. To reduce stomach acid 60 tablet 2  . FLUoxetine (PROZAC) 20 MG capsule Take 1 capsule (20 mg total) by mouth daily. 30 capsule 2  . hydrOXYzine (ATARAX/VISTARIL) 10 MG tablet Take 1 tablet (10 mg total) by mouth 3 (three) times daily as needed. 90 tablet 2  . prazosin (MINIPRESS) 1 MG capsule Take 1 capsule (1 mg total) by mouth at bedtime. 30 capsule 2  . traZODone (DESYREL) 50 MG tablet Take 1 tablet (50 mg total) by mouth at bedtime as needed for sleep. 30 tablet 2   No current facility-administered medications on file prior to visit.    Allergies  Allergen Reactions  . Lamotrigine Hives    Social History   Socioeconomic History  .  Marital status: Single    Spouse name: Not on file  . Number of children: Not on file  . Years of education: Not on file  . Highest education level: Not on file  Occupational History  . Not on file  Tobacco Use  . Smoking status: Current Some Day Smoker    Packs/day: 0.25    Types: Cigarettes  . Smokeless tobacco: Never Used  Vaping Use  . Vaping Use: Never used  Substance and Sexual Activity  . Alcohol use: Yes    Alcohol/week: 2.0 standard drinks    Types: 1 Glasses of wine, 1 Cans of beer per week  . Drug use: No  . Sexual activity: Not Currently    Birth control/protection: Surgical  Other Topics Concern  . Not on file  Social History Narrative  . Not on file   Social Determinants of Health   Financial Resource Strain: Not on file  Food Insecurity: Not on file  Transportation Needs: Not on file  Physical Activity: Not on file  Stress: Not on file  Social Connections: Not on file  Intimate Partner Violence: Not on file    Family History  Problem Relation Age of Onset  . Hypertension Mother   . Cancer Mother        breast  . Anxiety disorder Mother   . Hypertension Father   . Schizophrenia Father   . Bipolar disorder Father   . Anxiety disorder Father     Past Surgical History:  Procedure Laterality Date  . ABDOMINAL HYSTERECTOMY    . CHOLECYSTECTOMY    . DILATION AND CURETTAGE OF UTERUS    . FOOT SURGERY Bilateral    two pins in each foot  . KNEE SURGERY      ROS: Review of Systems  Constitutional: Negative for activity change and fatigue.  HENT: Negative for dental problem, hearing loss and sore throat.   Eyes: Negative for visual disturbance.  Respiratory: Negative for cough and shortness of breath.   Cardiovascular: Negative for chest pain.  Gastrointestinal: Negative for blood in stool.       She is moving her bowels okay.  Genitourinary: Negative for difficulty urinating.     PHYSICAL EXAM: BP (!) 152/89   Pulse 77   Temp 98.4 F  (36.9 C)   Resp 16   Ht 5' 4.5" (1.638 m)   Wt 196 lb 9.6 oz (89.2 kg)   SpO2 99%   BMI 33.23 kg/m   Wt Readings from Last 3 Encounters:  11/07/20 196 lb 9.6 oz (89.2 kg)  09/05/20 195 lb 6.4 oz (88.6 kg)  07/06/19 200 lb (90.7 kg)  Physical Exam  General appearance - alert, well appearing, obese middle-aged African-American female and in no distress Mental status - normal mood, behavior, speech, dress, motor activity, and thought processes Eyes - pupils equal and reactive, extraocular eye movements intact Ears -small amount of wax buildup in the right ear canal.  Left ear canal and membrane within normal limits. Nose - normal and patent, no erythema, discharge or polyps Mouth - mucous membranes moist, pharynx normal without lesions Neck - supple, no significant adenopathy Lymphatics - no palpable lymphadenopathy, no hepatosplenomegaly Chest - clear to auscultation, no wheezes, rales or rhonchi, symmetric air entry Heart - normal rate, regular rhythm, normal S1, S2, no murmurs, rubs, clicks or gallops Abdomen - soft, nontender, nondistended, no masses or organomegaly Pelvic -deferred for next visit Neurological -grossly intact Musculoskeletal -mild enlargement of left knee joint.  Good passive range of motion extremities -no lower extremity edema  Depression screen [facility] 2/9 11/07/2020 09/05/2020  Decreased Interest 2 0  Down, Depressed, Hopeless 2 0  PHQ - 2 Score 4 0  Altered sleeping 2 -  Tired, decreased energy 1 -  Change in appetite 0 -  Feeling bad or failure about yourself  0 -  Trouble concentrating 1 -  Moving slowly or fidgety/restless 0 -  Suicidal thoughts 0 -  PHQ-9 Score 8 -  Some encounter information is confidential and restricted. Go to Review Flowsheets activity to see all data.    CMP Latest Ref Rng & Units 06/11/2019 03/31/2018 02/07/2014  Glucose 70 - 99 mg/dL 97 96 83  BUN 6 - 20 mg/dL 16 11 14   Creatinine 0.44 - 1.00 mg/dL 0.93 0.67 0.76  Sodium 135 -  145 mmol/L 139 141 138  Potassium 3.5 - 5.1 mmol/L 3.6 4.4 4.3  Chloride 98 - 111 mmol/L 104 106 104  CO2 22 - 32 mmol/L 24 22 25   Calcium 8.9 - 10.3 mg/dL 9.2 9.2 9.4  Total Protein 6.0 - 8.5 g/dL - 6.5 6.3  Total Bilirubin 0.0 - 1.2 mg/dL - <0.2 0.5  Alkaline Phos 39 - 117 IU/L - 86 61  AST 0 - 40 IU/L - 12 15  ALT 0 - 32 IU/L - 10 9   Lipid Panel     Component Value Date/Time   CHOL 144 03/31/2018 1147   TRIG 101 03/31/2018 1147   HDL 61 03/31/2018 1147   CHOLHDL 2.4 03/31/2018 1147   LDLCALC 63 03/31/2018 1147    CBC    Component Value Date/Time   WBC 9.2 06/11/2019 0301   RBC 4.37 06/11/2019 0301   HGB 13.5 06/11/2019 0301   HGB 12.7 03/31/2018 1147   HCT 40.3 06/11/2019 0301   HCT 38.3 03/31/2018 1147   PLT 275 06/11/2019 0301   PLT 251 03/31/2018 1147   MCV 92.2 06/11/2019 0301   MCV 93 03/31/2018 1147   MCH 30.9 06/11/2019 0301   MCHC 33.5 06/11/2019 0301   RDW 13.3 06/11/2019 0301   RDW 13.4 03/31/2018 1147    ASSESSMENT AND PLAN: 1. Annual physical exam   2. Essential hypertension Review of chart reveals elevated blood pressure on previous visits.  DASH diet discussed and encouraged.  Patient agreeable to starting low-dose of antihypertensive in the form of amlodipine.  3. Obesity (BMI 30.0-34.9) Discussed and encourage healthy eating habits.  Advised to eliminate sugary drinks from the diet.  Encouraged her to incorporate fresh fruits and vegetables into the diet every day.  Cut back on portion sizes of white  carbohydrates and eat more lean meat like chicken [place] and seafood instead of red meat or pork. -Encourage her to try to move more but start low and go slow.  Recommend starting walking for 10 minutes 3 times a week and then gradually try to increase the time. - CBC - Comprehensive metabolic panel - Lipid panel - Hemoglobin A1c  4. Tobacco dependence Advised to quit.  Discussed health risks associated with smoking.  Patient willing and wanting  to give a trial of quitting.  Discussed methods to help her quit.  She is agreeable to trying the nicotine patch.  Given the amount that she currently smokes, I recommend starting the 14 mg patch.  I went over how to use the patch.  We will have her do the 14 mg for 1 to 2 months then stepdown to the 7 mg.  Less than 5 minutes spent on counseling  5. Gastroesophageal reflux disease without esophagitis Doing well on Pepcid.  She knows to avoid foods that would cause flareups.  6. Screening for colon cancer Discussed colon cancer screening for average risk individuals and methods to screen.  She prefers to have colonoscopy. - Ambulatory referral to Gastroenterology  7. Encounter for screening mammogram for malignant neoplasm of breast Discussed breast cancer screening.  Best to start after the age of 89 but we did shared decision making and given that she has a parent with breast cancer I think it is reasonable to screen at her age. - MM Digital Screening; Future  8. Family history of breast cancer - MM Digital Screening; Future  9. Influenza vaccine refused   10. History of bipolar disorder/bipolar disorder to major depressive episode 11. PTSD (post-traumatic stress disorder) Plugged into mental health services  History of abnormal Pap smear. She will return in 6 weeks for [place] to do the Pap and breast exam.  Patient was given the opportunity to ask questions.  Patient verbalized understanding of the plan and was able to repeat key elements of the plan.   No orders of the defined types were placed in this encounter.    Requested Prescriptions    No prescriptions requested or ordered in this encounter    No follow-ups on file.  Karle Plumber, MD, FACP

## 2020-11-08 LAB — COMPREHENSIVE METABOLIC PANEL
ALT: 14 IU/L (ref 0–32)
AST: 14 IU/L (ref 0–40)
Albumin/Globulin Ratio: 2.1 (ref 1.2–2.2)
Albumin: 4.7 g/dL (ref 3.8–4.8)
Alkaline Phosphatase: 112 IU/L (ref 44–121)
BUN/Creatinine Ratio: 17 (ref 9–23)
BUN: 17 mg/dL (ref 6–24)
Bilirubin Total: <0.2 mg/dL (ref 0.0–1.2)
CO2: 28 mmol/L (ref 20–29)
Calcium: 9.9 mg/dL (ref 8.7–10.2)
Chloride: 107 mmol/L — ABNORMAL HIGH (ref 96–106)
Creatinine, Ser: 1 mg/dL (ref 0.57–1.00)
GFR calc Af Amer: 78 mL/min/{1.73_m2} (ref >59)
GFR calc non Af Amer: 68 mL/min/{1.73_m2} (ref >59)
Globulin, Total: 2.2 g/dL (ref 1.5–4.5)
Glucose: 100 mg/dL — ABNORMAL HIGH (ref 65–99)
Potassium: 4.2 mmol/L (ref 3.5–5.2)
Sodium: 147 mmol/L — ABNORMAL HIGH (ref 134–144)
Total Protein: 6.9 g/dL (ref 6.0–8.5)

## 2020-11-08 LAB — LIPID PANEL
Chol/HDL Ratio: 2.6 ratio (ref 0.0–4.4)
Cholesterol, Total: 165 mg/dL (ref 100–199)
HDL: 63 mg/dL (ref >39)
LDL Chol Calc (NIH): 86 mg/dL (ref 0–99)
Triglycerides: 84 mg/dL (ref 0–149)
VLDL Cholesterol Cal: 16 mg/dL (ref 5–40)

## 2020-11-08 LAB — CBC
Hematocrit: 42.3 % (ref 34.0–46.6)
Hemoglobin: 14.2 g/dL (ref 11.1–15.9)
MCH: 30.5 pg (ref 26.6–33.0)
MCHC: 33.6 g/dL (ref 31.5–35.7)
MCV: 91 fL (ref 79–97)
Platelets: 250 10*3/uL (ref 150–450)
RBC: 4.66 x10E6/uL (ref 3.77–5.28)
RDW: 12.5 % (ref 11.7–15.4)
WBC: 8.2 10*3/uL (ref 3.4–10.8)

## 2020-11-08 LAB — HEMOGLOBIN A1C
Est. average glucose Bld gHb Est-mCnc: 117 mg/dL
Hgb A1c MFr Bld: 5.7 % — ABNORMAL HIGH (ref 4.8–5.6)

## 2020-11-15 ENCOUNTER — Encounter: Payer: Self-pay | Admitting: Physical Medicine and Rehabilitation

## 2020-11-15 ENCOUNTER — Ambulatory Visit (INDEPENDENT_AMBULATORY_CARE_PROVIDER_SITE_OTHER): Payer: PRIVATE HEALTH INSURANCE | Admitting: Physical Medicine and Rehabilitation

## 2020-11-15 ENCOUNTER — Other Ambulatory Visit: Payer: Self-pay

## 2020-11-15 DIAGNOSIS — M79641 Pain in right hand: Secondary | ICD-10-CM | POA: Diagnosis not present

## 2020-11-15 DIAGNOSIS — M25521 Pain in right elbow: Secondary | ICD-10-CM | POA: Diagnosis not present

## 2020-11-15 DIAGNOSIS — R202 Paresthesia of skin: Secondary | ICD-10-CM | POA: Diagnosis not present

## 2020-11-15 NOTE — Progress Notes (Signed)
Numbness and tingling in first three fingers of right hand. Pain in right elbow and shoulder. Worse at night. Brace helps. Right hand dominant Lotion Numeric Pain Rating Scale and Functional Assessment Average Pain 8   In the last MONTH (on 0-10 scale) has pain interfered with the following?  1. General activity like being  able to carry out your everyday physical activities such as walking, climbing stairs, carrying groceries, or moving a chair?  Rating(6)

## 2020-11-15 NOTE — Progress Notes (Signed)
Theresa Cantrell - 45 y.o. female MRN [id number]  Date of birth: [birth year]  Office Visit Note: Visit Date: 11/15/2020 PCP: Ladell Pier, MD Referred by: Antony Blackbird, MD  Subjective: Chief Complaint  Patient presents with  . Right Hand - Numbness  . Right Elbow - Pain  . Right Shoulder - Pain   HPI: Theresa Cantrell is a 45 y.o. female who comes in today at the request of Dr. Eduard Roux for electrodiagnostic study of the Right upper extremities.  Patient is Right hand dominant. She reports chronic worsening numbness and tingling particularly in the radial first 3 digits of the right hand. She gets some referral in the elbow and shoulder. This is worse at night with nocturnal complaints. Bracing has helped. She reports 8 out of 10 pain. She denies any radicular type symptoms. She does have a history of some type of neuropathy but no other electrodiagnostic studies in the chart. She does have mildly elevated A1c. Shoulder pain is increased with movement of the arm. She did get some relief with injection by Dr. Eunice Blase using ultrasound guidance. She denies any left-sided complaints. No specific injury. She denies any focal weakness. No red flag complaints.   Review of Systems  Constitutional: Negative for chills, fever, malaise/fatigue and weight loss.  HENT: Negative for hearing loss and sinus pain.   Eyes: Negative for blurred vision, double vision and photophobia.  Respiratory: Negative for cough and shortness of breath.   Cardiovascular: Negative for chest pain, palpitations and leg swelling.  Gastrointestinal: Negative for abdominal pain, nausea and vomiting.  Genitourinary: Negative for flank pain.  Musculoskeletal: Positive for joint pain. Negative for myalgias.  Skin: Negative for itching and rash.  Neurological: Positive for tingling. Negative for tremors, focal weakness and weakness.  Endo/Heme/Allergies: Negative.   Psychiatric/Behavioral: Negative for depression.   All other systems reviewed and are negative.  Otherwise per HPI.  Assessment & Plan: Visit Diagnoses:    ICD-10-CM   1. Paresthesia of skin  R20.2 NCV with EMG (electromyography)  2. Pain in right hand  M79.641   3. Pain in right elbow  M25.521      Plan: Impression: Essentially NORMAL electrodiagnostic study of the right upper limb.  There is no significant electrodiagnostic evidence of nerve entrapment, brachial plexopathy or cervical radiculopathy.    As you know, purely sensory or demyelinating radiculopathies and chemical radiculitis may not be detected with this particular electrodiagnostic study.  Recommendations: 1.  Follow-up with referring physician. 2.  Continue current management of symptoms. 3.  Continue use of resting splint at night-time and as needed during the day.  Meds & Orders: No orders of the defined types were placed in this encounter.   Orders Placed This Encounter  Procedures  . NCV with EMG (electromyography)    Follow-up: Return in about 2 weeks (around 11/29/2020) for Eduard Roux, MD.   Procedures: No procedures performed  EMG & NCV Findings: Evaluation of the right median (across palm) sensory nerve showed no response (Palm).  All remaining nerves (as indicated in the following tables) were within normal limits.    All examined muscles (as indicated in the following table) showed no evidence of electrical instability.    Impression: Essentially NORMAL electrodiagnostic study of the right upper limb.  There is no significant electrodiagnostic evidence of nerve entrapment, brachial plexopathy or cervical radiculopathy.    As you know, purely sensory or demyelinating radiculopathies and chemical radiculitis may not be detected with this  particular electrodiagnostic study.  Recommendations: 1.  Follow-up with referring physician. 2.  Continue current management of symptoms. 3.  Continue use of resting splint at night-time and as needed during the  day.  ___________________________ Wonda Olds Board Certified, American Board of Physical Medicine and Rehabilitation    Nerve Conduction Studies Anti Sensory Summary Table   Stim Site NR Peak (ms) Norm Peak (ms) P-T Amp (V) Norm P-T Amp Site1 Site2 Delta-P (ms) Dist (cm) Vel (m/s) Norm Vel (m/s)  Right Median Acr Palm Anti Sensory (2nd Digit)  32.6C  Wrist    3.5 <3.6 43.0 >10 Wrist Palm  0.0    Palm *NR  <2.0          Right Radial Anti Sensory (Base 1st Digit)  32.7C  Wrist    2.1 <3.1 22.1  Wrist Base 1st Digit 2.1 0.0    Right Ulnar Anti Sensory (5th Digit)  32.9C  Wrist    3.0 <3.7 24.2 >15.0 Wrist 5th Digit 3.0 14.0 47 >38   Motor Summary Table   Stim Site NR Onset (ms) Norm Onset (ms) O-P Amp (mV) Norm O-P Amp Site1 Site2 Delta-0 (ms) Dist (cm) Vel (m/s) Norm Vel (m/s)  Right Median Motor (Abd Poll Brev)  32.9C  Wrist    3.4 <4.2 7.0 >5 Elbow Wrist 3.9 21.0 54 >50  Elbow    7.3  7.0         Right Ulnar Motor (Abd Dig Min)  33.2C  Wrist    2.2 <4.2 9.9 >3 B Elbow Wrist 3.7 21.0 57 >53  B Elbow    5.9  9.8  A Elbow B Elbow 1.1 10.0 91 >53  A Elbow    7.0  9.4          EMG   Side Muscle Nerve Root Ins Act Fibs Psw Amp Dur Poly Recrt Int Fraser Din Comment  Right Abd Poll Brev Median C8-T1 Nml Nml Nml Nml Nml 0 Nml Nml   Right 1stDorInt Ulnar C8-T1 Nml Nml Nml Nml Nml 0 Nml Nml   Right PronatorTeres Median C6-7 Nml Nml Nml Nml Nml 0 Nml Nml   Right Biceps Musculocut C5-6 Nml Nml Nml Nml Nml 0 Nml Nml   Right Deltoid Axillary C5-6 Nml Nml Nml Nml Nml 0 Nml Nml     Nerve Conduction Studies Anti Sensory Left/Right Comparison   Stim Site L Lat (ms) R Lat (ms) L-R Lat (ms) L Amp (V) R Amp (V) L-R Amp (%) Site1 Site2 L Vel (m/s) R Vel (m/s) L-R Vel (m/s)  Median Acr Palm Anti Sensory (2nd Digit)  32.6C  Wrist  3.5   43.0  Wrist Palm     Palm             Radial Anti Sensory (Base 1st Digit)  32.7C  Wrist  2.1   22.1  Wrist Base 1st Digit     Ulnar Anti Sensory  (5th Digit)  32.9C  Wrist  3.0   24.2  Wrist 5th Digit  47    Motor Left/Right Comparison   Stim Site L Lat (ms) R Lat (ms) L-R Lat (ms) L Amp (mV) R Amp (mV) L-R Amp (%) Site1 Site2 L Vel (m/s) R Vel (m/s) L-R Vel (m/s)  Median Motor (Abd Poll Brev)  32.9C  Wrist  3.4   7.0  Elbow Wrist  54   Elbow  7.3   7.0        Ulnar Motor (Abd  Dig Min)  33.2C  Wrist  2.2   9.9  B Elbow Wrist  57   B Elbow  5.9   9.8  A Elbow B Elbow  91   A Elbow  7.0   9.4           Waveforms:             Clinical History: No specialty comments available.   She reports that she has been smoking cigarettes. She has been smoking about 0.25 packs per day. She has never used smokeless tobacco.  Recent Labs    11/07/20 1610  HGBA1C 5.7*    Objective:  VS:  HT:    WT:   BMI:     BP:   HR: bpm  TEMP: ( )  RESP:  Physical Exam Constitutional:      Appearance: She is obese.  Musculoskeletal:        General: No swelling, tenderness or deformity.     Comments: Inspection reveals no atrophy of the bilateral APB or FDI or hand intrinsics. There is no swelling, color changes, allodynia or dystrophic changes. There is 5 out of 5 strength in the bilateral wrist extension, finger abduction and long finger flexion. There is intact sensation to light touch in all dermatomal and peripheral nerve distributions. There is a negative Hoffmann's test bilaterally.  Skin:    General: Skin is warm and dry.     Findings: No erythema or rash.  Neurological:     General: No focal deficit present.     Mental Status: She is alert and oriented to person, place, and time.     Motor: No weakness or abnormal muscle tone.     Coordination: Coordination normal.  Psychiatric:        Mood and Affect: Mood normal.        Behavior: Behavior normal.     Ortho Exam  Imaging: No results found.  Past Medical/Family/Surgical/Social History: Medications & Allergies reviewed per EMR, new medications updated. Patient Active  Problem List   Diagnosis Date Noted  . Influenza vaccine refused 11/07/2020  . Essential hypertension 11/07/2020  . Obesity (BMI 30.0-34.9) 11/07/2020  . Tobacco dependence 11/07/2020  . Gastroesophageal reflux disease without esophagitis 11/07/2020  . History of abnormal cervical Pap smear 11/07/2020  . History of bipolar disorder 11/07/2020  . Bipolar 2 disorder, major depressive episode ([place]) 06/12/2020  . PTSD (post-traumatic stress disorder) 06/12/2020  . Chronic pain of left knee 10/05/2019  . 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 Neuropathy 02/07/2014  . Hot flashes 02/07/2014  . Weight loss 02/07/2014  . Anxiety    Past Medical History:  Diagnosis Date  . Anxiety   . Arthritis   . Hepatitis C    Family History  Problem Relation Age of Onset  . Hypertension Mother   . Cancer Mother        breast  . Anxiety disorder Mother   . Hypertension Father   . Schizophrenia Father   . Bipolar disorder Father   . Anxiety disorder Father    Past Surgical History:  Procedure Laterality Date  . ABDOMINAL HYSTERECTOMY    . CHOLECYSTECTOMY    . DILATION AND CURETTAGE OF UTERUS    . FOOT SURGERY Bilateral    two pins in each foot  . KNEE SURGERY     Social History   Occupational History  . Not on file  Tobacco Use  . Smoking status: Current Some  Day Smoker    Packs/day: 0.25    Types: Cigarettes  . Smokeless tobacco: Never Used  Vaping Use  . Vaping Use: Never used  Substance and Sexual Activity  . Alcohol use: Yes    Alcohol/week: 2.0 standard drinks    Types: 1 Glasses of wine, 1 Cans of beer per week  . Drug use: No  . Sexual activity: Not Currently    Birth control/protection: Surgical

## 2020-11-19 ENCOUNTER — Encounter: Payer: Self-pay | Admitting: Gastroenterology

## 2020-11-19 NOTE — Procedures (Signed)
EMG & NCV Findings: Evaluation of the right median (across palm) sensory nerve showed no response (Palm).  All remaining nerves (as indicated in the following tables) were within normal limits.    All examined muscles (as indicated in the following table) showed no evidence of electrical instability.    Impression: Essentially NORMAL electrodiagnostic study of the right upper limb.  There is no significant electrodiagnostic evidence of nerve entrapment, brachial plexopathy or cervical radiculopathy.    As you know, purely sensory or demyelinating radiculopathies and chemical radiculitis may not be detected with this particular electrodiagnostic study.  Recommendations: 1.  Follow-up with referring physician. 2.  Continue current management of symptoms. 3.  Continue use of resting splint at night-time and as needed during the day.  ___________________________ Wonda Olds Board Certified, American Board of Physical Medicine and Rehabilitation    Nerve Conduction Studies Anti Sensory Summary Table   Stim Site NR Peak (ms) Norm Peak (ms) P-T Amp (V) Norm P-T Amp Site1 Site2 Delta-P (ms) Dist (cm) Vel (m/s) Norm Vel (m/s)  Right Median Acr Palm Anti Sensory (2nd Digit)  32.6C  Wrist    3.5 <3.6 43.0 >10 Wrist Palm  0.0    Palm *NR  <2.0          Right Radial Anti Sensory (Base 1st Digit)  32.7C  Wrist    2.1 <3.1 22.1  Wrist Base 1st Digit 2.1 0.0    Right Ulnar Anti Sensory (5th Digit)  32.9C  Wrist    3.0 <3.7 24.2 >15.0 Wrist 5th Digit 3.0 14.0 47 >38   Motor Summary Table   Stim Site NR Onset (ms) Norm Onset (ms) O-P Amp (mV) Norm O-P Amp Site1 Site2 Delta-0 (ms) Dist (cm) Vel (m/s) Norm Vel (m/s)  Right Median Motor (Abd Poll Brev)  32.9C  Wrist    3.4 <4.2 7.0 >5 Elbow Wrist 3.9 21.0 54 >50  Elbow    7.3  7.0         Right Ulnar Motor (Abd Dig Min)  33.2C  Wrist    2.2 <4.2 9.9 >3 B Elbow Wrist 3.7 21.0 57 >53  B Elbow    5.9  9.8  A Elbow B Elbow 1.1 10.0 91  >53  A Elbow    7.0  9.4          EMG   Side Muscle Nerve Root Ins Act Fibs Psw Amp Dur Poly Recrt Int Fraser Din Comment  Right Abd Poll Brev Median C8-T1 Nml Nml Nml Nml Nml 0 Nml Nml   Right 1stDorInt Ulnar C8-T1 Nml Nml Nml Nml Nml 0 Nml Nml   Right PronatorTeres Median C6-7 Nml Nml Nml Nml Nml 0 Nml Nml   Right Biceps Musculocut C5-6 Nml Nml Nml Nml Nml 0 Nml Nml   Right Deltoid Axillary C5-6 Nml Nml Nml Nml Nml 0 Nml Nml     Nerve Conduction Studies Anti Sensory Left/Right Comparison   Stim Site L Lat (ms) R Lat (ms) L-R Lat (ms) L Amp (V) R Amp (V) L-R Amp (%) Site1 Site2 L Vel (m/s) R Vel (m/s) L-R Vel (m/s)  Median Acr Palm Anti Sensory (2nd Digit)  32.6C  Wrist  3.5   43.0  Wrist Palm     Palm             Radial Anti Sensory (Base 1st Digit)  32.7C  Wrist  2.1   22.1  Wrist Base 1st Digit     Ulnar  Anti Sensory (5th Digit)  32.9C  Wrist  3.0   24.2  Wrist 5th Digit  47    Motor Left/Right Comparison   Stim Site L Lat (ms) R Lat (ms) L-R Lat (ms) L Amp (mV) R Amp (mV) L-R Amp (%) Site1 Site2 L Vel (m/s) R Vel (m/s) L-R Vel (m/s)  Median Motor (Abd Poll Brev)  32.9C  Wrist  3.4   7.0  Elbow Wrist  54   Elbow  7.3   7.0        Ulnar Motor (Abd Dig Min)  33.2C  Wrist  2.2   9.9  B Elbow Wrist  57   B Elbow  5.9   9.8  A Elbow B Elbow  91   A Elbow  7.0   9.4           Waveforms:

## 2020-11-22 ENCOUNTER — Ambulatory Visit: Payer: No Typology Code available for payment source | Admitting: Orthopaedic Surgery

## 2020-12-03 ENCOUNTER — Ambulatory Visit (INDEPENDENT_AMBULATORY_CARE_PROVIDER_SITE_OTHER): Payer: PRIVATE HEALTH INSURANCE | Admitting: Orthopaedic Surgery

## 2020-12-03 ENCOUNTER — Other Ambulatory Visit: Payer: Self-pay

## 2020-12-03 ENCOUNTER — Encounter: Payer: Self-pay | Admitting: Orthopaedic Surgery

## 2020-12-03 DIAGNOSIS — G5602 Carpal tunnel syndrome, left upper limb: Secondary | ICD-10-CM | POA: Diagnosis not present

## 2020-12-03 DIAGNOSIS — G5601 Carpal tunnel syndrome, right upper limb: Secondary | ICD-10-CM | POA: Diagnosis not present

## 2020-12-03 MED ORDER — LIDOCAINE HCL 1 % IJ SOLN
1.0000 mL | INTRAMUSCULAR | Status: AC | PRN
  Administered 2020-12-03: 1 mL

## 2020-12-03 MED ORDER — METHYLPREDNISOLONE ACETATE 40 MG/ML IJ SUSP
40.0000 mg | INTRAMUSCULAR | Status: AC | PRN
  Administered 2020-12-03: 40 mg

## 2020-12-03 MED ORDER — BUPIVACAINE HCL 0.5 % IJ SOLN
1.0000 mL | INTRAMUSCULAR | Status: AC | PRN
  Administered 2020-12-03: 1 mL

## 2020-12-03 NOTE — Progress Notes (Signed)
Office Visit Note   Patient: Theresa Cantrell           Date of Birth: 08-20-1976           MRN: [id number] Visit Date: 12/03/2020              Requested by: Ladell Pier, MD [address] [place],  [place] [address] PCP: Ladell Pier, MD   Assessment & Plan: Visit Diagnoses:  1. Right carpal tunnel syndrome   2. Left carpal tunnel syndrome     Plan: Based on findings I have recommended diagnostic and hopefully therapeutic bilateral carpal tunnel injections today.  Patient tolerated these well.  She will follow-up with [place] if she has not noticed any improvement.  Follow-Up Instructions: Return if symptoms worsen or fail to improve.   Orders:  Orders Placed This Encounter  Procedures  . Hand/UE Inj   No orders of the defined types were placed in this encounter.     Procedures: Hand/UE Inj: bilateral carpal tunnel for carpal tunnel syndrome on 12/03/2020 4:12 PM Indications: pain Details: 25 G needle Medications (Right): 1 mL lidocaine 1 %; 1 mL bupivacaine 0.5 %; 40 mg methylPREDNISolone acetate 40 MG/ML Medications (Left): 1 mL lidocaine 1 %; 1 mL bupivacaine 0.5 %; 40 mg methylPREDNISolone acetate 40 MG/ML Outcome: tolerated well, no immediate complications Patient was prepped and draped in the usual sterile fashion.       Clinical Data: No additional findings.   Subjective: Chief Complaint  Patient presents with  . Right Shoulder - Pain    Patient returns today for EMG review which was normal.   Review of Systems   Objective: Vital Signs: There were no vitals taken for this visit.  Physical Exam  Ortho Exam Exam stable  Specialty Comments:  No specialty comments available.  Imaging: No results found.   PMFS History: Patient Active Problem List   Diagnosis Date Noted  . Influenza vaccine refused 11/07/2020  . Essential hypertension 11/07/2020  . Obesity (BMI 30.0-34.9) 11/07/2020  . Tobacco dependence 11/07/2020  .  Gastroesophageal reflux disease without esophagitis 11/07/2020  . History of abnormal cervical Pap smear 11/07/2020  . History of bipolar disorder 11/07/2020  . Bipolar 2 disorder, major depressive episode ([place]) 06/12/2020  . PTSD (post-traumatic stress disorder) 06/12/2020  . Chronic pain of left knee 10/05/2019  . 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 Neuropathy 02/07/2014  . Hot flashes 02/07/2014  . Weight loss 02/07/2014  . Anxiety    Past Medical History:  Diagnosis Date  . Anxiety   . Arthritis   . Hepatitis C     Family History  Problem Relation Age of Onset  . Hypertension Mother   . Cancer Mother        breast  . Anxiety disorder Mother   . Hypertension Father   . Schizophrenia Father   . Bipolar disorder Father   . Anxiety disorder Father     Past Surgical History:  Procedure Laterality Date  . ABDOMINAL HYSTERECTOMY    . CHOLECYSTECTOMY    . DILATION AND CURETTAGE OF UTERUS    . FOOT SURGERY Bilateral    two pins in each foot  . KNEE SURGERY     Social History   Occupational History  . Not on file  Tobacco Use  . Smoking status: Current Some Day Smoker    Packs/day: 0.25    Types: Cigarettes  . Smokeless tobacco: Never Used  Vaping Use  . Vaping Use: Never used  Substance and Sexual Activity  . Alcohol use: Yes    Alcohol/week: 2.0 standard drinks    Types: 1 Glasses of wine, 1 Cans of beer per week  . Drug use: No  . Sexual activity: Not Currently    Birth control/protection: Surgical

## 2020-12-04 ENCOUNTER — Other Ambulatory Visit: Payer: Self-pay

## 2020-12-04 ENCOUNTER — Ambulatory Visit (AMBULATORY_SURGERY_CENTER): Payer: Self-pay

## 2020-12-04 VITALS — Ht 64.5 in | Wt 195.0 lb

## 2020-12-04 DIAGNOSIS — Z1211 Encounter for screening for malignant neoplasm of colon: Secondary | ICD-10-CM

## 2020-12-04 MED ORDER — SUTAB 1479-225-188 MG PO TABS: 12.0000 | ORAL_TABLET | ORAL | 0 refills | Status: DC

## 2020-12-04 NOTE — Progress Notes (Signed)
No allergies to soy or egg Pt is not on blood thinners or diet pills Denies issues with sedation/intubation Denies atrial flutter/fib Denies constipation   Emmi instructions given to pt  Pt is aware of Covid safety and care partner requirements.  

## 2020-12-05 ENCOUNTER — Encounter: Payer: Self-pay | Admitting: Physician Assistant

## 2020-12-05 ENCOUNTER — Ambulatory Visit (INDEPENDENT_AMBULATORY_CARE_PROVIDER_SITE_OTHER): Payer: PRIVATE HEALTH INSURANCE | Admitting: Orthopaedic Surgery

## 2020-12-05 ENCOUNTER — Telehealth: Payer: Self-pay | Admitting: Physician Assistant

## 2020-12-05 DIAGNOSIS — M25532 Pain in left wrist: Secondary | ICD-10-CM | POA: Diagnosis not present

## 2020-12-05 MED ORDER — DICLOFENAC SODIUM 75 MG PO TBEC: 75.0000 mg | DELAYED_RELEASE_TABLET | Freq: Two times a day (BID) | ORAL | 0 refills | Status: DC | PRN

## 2020-12-05 MED ORDER — PREDNISONE 10 MG (21) PO TBPK
ORAL_TABLET | ORAL | 0 refills | Status: DC
Start: 2020-12-05 — End: 2021-01-13

## 2020-12-05 MED ORDER — HYDROCODONE-ACETAMINOPHEN 5-325 MG PO TABS
1.0000 | ORAL_TABLET | Freq: Two times a day (BID) | ORAL | 0 refills | Status: DC | PRN
Start: 2020-12-05 — End: 2021-01-21

## 2020-12-05 NOTE — Progress Notes (Unsigned)
Office Visit Note   Patient: Theresa Cantrell           Date of Birth: 07/18/1976           MRN: [id number] Visit Date: 12/05/2020              Requested by: Ladell Pier, MD [address] [place],  [place] [address] PCP: Ladell Pier, MD   Assessment & Plan: Visit Diagnoses:  1. Pain in left wrist     Plan: Impression is left wrist and hand pain and inflammation following carpal tunnel injection.  At this point, I would like to start the patient on a steroid taper followed by a course of anti-inflammatories.  I will also call in [place] to take as needed.  We have provided her with an out of work note until Monday.  She will call us if she needs this extended.  She will also call us next week and let us know how her left hand is.   Follow-Up Instructions: Return if symptoms worsen or fail to improve.   Orders:  No orders of the defined types were placed in this encounter.  Meds ordered this encounter  Medications  . predniSONE (STERAPRED UNI-PAK 21 TAB) 10 MG (21) TBPK tablet    Sig: Take as directed    Dispense:  21 tablet    Refill:  0  . diclofenac (VOLTAREN) 75 MG EC tablet    Sig: Take 1 tablet (75 mg total) by mouth 2 (two) times daily as needed. May take this medication as needed once finished with the steroid taper    Dispense:  60 tablet    Refill:  0  . HYDROcodone-acetaminophen (NORCO) 5-325 MG tablet    Sig: Take 1 tablet by mouth 2 (two) times daily as needed.    Dispense:  10 tablet    Refill:  0      Procedures: No procedures performed   Clinical Data: No additional findings.   Subjective: Chief Complaint  Patient presents with  . Right Hand - Pain  . Left Hand - Pain    HPI patient is a very pleasant 45 year old who comes in today with concerns about her left wrist.  She was here Tuesday afternoon where bilateral carpal tunnel injections were performed.  She had significant relief with the right injection.  She does note that she has  had increased pain and stiffness to the left wrist and hand following the injection.  She denies any paresthesias to the left hand.  The pain is worse with any movement of the hand or fingers.  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  Review of Systems as detailed in HPI.  All others reviewed and are negative.   Objective: Vital Signs: There were no vitals taken for this visit.  Physical Exam well-developed well-nourished female no acute distress.  Alert and oriented x3.  Ortho Exam left wrist exam shows mild swelling throughout the hand and fingers.  Diffuse tenderness throughout.  She almost able to fully extend her fingers but she does have trouble with trying to make a fist.  Her fingers are warm and well-perfused and she is neurovascularly intact distally.  Specialty Comments:  No specialty comments available.  Imaging: No new imaging   PMFS History: Patient Active Problem List   Diagnosis Date Noted  . Influenza vaccine refused 11/07/2020  . Essential hypertension  11/07/2020  . Obesity (BMI 30.0-34.9) 11/07/2020  . Tobacco dependence 11/07/2020  . Gastroesophageal reflux disease without esophagitis 11/07/2020  . History of abnormal cervical Pap smear 11/07/2020  . History of bipolar disorder 11/07/2020  . Bipolar 2 disorder, major depressive episode ([place]) 06/12/2020  . PTSD (post-traumatic stress disorder) 06/12/2020  . Chronic pain of left knee 10/05/2019  . 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 Neuropathy 02/07/2014  . Hot flashes 02/07/2014  . Weight loss 02/07/2014  . Anxiety    Past Medical History:  Diagnosis Date  . Anxiety   . Arthritis   . Bipolar 1 disorder ([place])   . Depression   . Hepatitis C   . Hypertension   . PTSD (post-traumatic stress disorder)     Family History  Problem Relation Age of Onset  . Hypertension Mother   . Cancer Mother        breast  .  Anxiety disorder Mother   . Hypertension Father   . Schizophrenia Father   . Bipolar disorder Father   . Anxiety disorder Father   . Colon cancer Neg Hx   . Colon polyps Neg Hx   . Esophageal cancer Neg Hx   . Rectal cancer Neg Hx   . Stomach cancer Neg Hx     Past Surgical History:  Procedure Laterality Date  . ABDOMINAL HYSTERECTOMY    . CHOLECYSTECTOMY    . DILATION AND CURETTAGE OF UTERUS    . FOOT SURGERY Bilateral    two pins in each foot  . KNEE SURGERY     Social History   Occupational History  . Not on file  Tobacco Use  . Smoking status: Current Some Day Smoker    Packs/day: 0.25    Types: Cigarettes  . Smokeless tobacco: Never Used  Vaping Use  . Vaping Use: Never used  Substance and Sexual Activity  . Alcohol use: Yes    Alcohol/week: 2.0 standard drinks    Types: 1 Glasses of wine, 1 Cans of beer per week  . Drug use: No  . Sexual activity: Not Currently    Birth control/protection: Surgical

## 2020-12-05 NOTE — Telephone Encounter (Signed)
I talked to her.  Can you open up a slot and put her at 130

## 2020-12-05 NOTE — Telephone Encounter (Signed)
Please call her

## 2020-12-05 NOTE — Telephone Encounter (Signed)
Patient called advised she hand an injection in her wrist and is experiencing left hand, wrist pain,swelling and stiffness. Patient advised she was seen on 12/03/2020. Patient said she is having a ruff time at work . Patient asked if she can be seen in the office today? The number to contact patient is [phone]

## 2020-12-18 ENCOUNTER — Ambulatory Visit (AMBULATORY_SURGERY_CENTER): Payer: PRIVATE HEALTH INSURANCE | Admitting: Gastroenterology

## 2020-12-18 ENCOUNTER — Other Ambulatory Visit: Payer: Self-pay

## 2020-12-18 ENCOUNTER — Encounter: Payer: Self-pay | Admitting: Gastroenterology

## 2020-12-18 VITALS — BP 133/86 | HR 76 | Temp 97.3°F | Resp 13 | Ht 64.5 in | Wt 195.0 lb

## 2020-12-18 DIAGNOSIS — D124 Benign neoplasm of descending colon: Secondary | ICD-10-CM

## 2020-12-18 DIAGNOSIS — K635 Polyp of colon: Secondary | ICD-10-CM

## 2020-12-18 DIAGNOSIS — D123 Benign neoplasm of transverse colon: Secondary | ICD-10-CM

## 2020-12-18 DIAGNOSIS — K5730 Diverticulosis of large intestine without perforation or abscess without bleeding: Secondary | ICD-10-CM

## 2020-12-18 DIAGNOSIS — D125 Benign neoplasm of sigmoid colon: Secondary | ICD-10-CM

## 2020-12-18 DIAGNOSIS — Z1211 Encounter for screening for malignant neoplasm of colon: Secondary | ICD-10-CM | POA: Diagnosis not present

## 2020-12-18 MED ORDER — SODIUM CHLORIDE 0.9 % IV SOLN: 500.0000 mL | Freq: Once | INTRAVENOUS | Status: DC

## 2020-12-18 NOTE — Progress Notes (Signed)
To PACU, VSS. Report to [email or web address] 

## 2020-12-18 NOTE — Patient Instructions (Signed)
Handout on polyps provided.  ° °Await pathology results.  ° °YOU HAD AN ENDOSCOPIC PROCEDURE TODAY AT THE [facility]:   Refer to the procedure report that was given to you for any specific questions about what was found during the examination.  If the procedure report does not answer your questions, please call your gastroenterologist to clarify.  If you requested that your care partner not be given the details of your procedure findings, then the procedure report has been included in a sealed envelope for you to review at your convenience later. ° °YOU SHOULD EXPECT: Some feelings of bloating in the abdomen. Passage of more gas than usual.  Walking can help get rid of the air that was put into your GI tract during the procedure and reduce the bloating. If you had a lower endoscopy (such as a colonoscopy or flexible sigmoidoscopy) you may notice spotting of blood in your stool or on the toilet paper. If you underwent a bowel prep for your procedure, you may not have a normal bowel movement for a few days. ° °Please Note:  You might notice some irritation and congestion in your nose or some drainage.  This is from the oxygen used during your procedure.  There is no need for concern and it should clear up in a day or so. ° °SYMPTOMS TO REPORT IMMEDIATELY: ° °Following lower endoscopy (colonoscopy or flexible sigmoidoscopy): ° Excessive amounts of blood in the stool ° Significant tenderness or worsening of abdominal pains ° Swelling of the abdomen that is new, acute ° Fever of 100°F or higher ° ° °For urgent or emergent issues, a gastroenterologist can be reached at any hour by calling [phone]. °Do not use MyChart messaging for urgent concerns.  ° ° °DIET:  We do recommend a small meal at first, but then you may proceed to your regular diet.  Drink plenty of fluids but you should avoid alcoholic beverages for 24 hours. ° °ACTIVITY:  You should plan to take it easy for the rest of today and you  should NOT DRIVE or use heavy machinery until tomorrow (because of the sedation medicines used during the test).   ° °FOLLOW UP: °Our staff will call the number listed on your records 48-72 hours following your procedure to check on you and address any questions or concerns that you may have regarding the information given to you following your procedure. If we do not reach you, we will leave a message.  We will attempt to reach you two times.  During this call, we will ask if you have developed any symptoms of COVID 19. If you develop any symptoms (ie: fever, flu-like symptoms, shortness of breath, cough etc.) before then, please call [phone].  If you test positive for Covid 19 in the 2 weeks post procedure, please call and report this information to us.   ° °If any biopsies were taken you will be contacted by phone or by letter within the next 1-3 weeks.  Please call us at [phone] if you have not heard about the biopsies in 3 weeks.  ° ° °SIGNATURES/CONFIDENTIALITY: °You and/or your care partner have signed paperwork which will be entered into your electronic medical record.  These signatures attest to the fact that that the information above on your After Visit Summary has been reviewed and is understood.  Full responsibility of the confidentiality of this discharge information lies with you and/or your care-partner. ° °

## 2020-12-18 NOTE — Op Note (Signed)
[facility] Patient Name: Theresa Cantrell Procedure Date: 12/18/2020 7:22 AM MRN: [id number] Endoscopist: Gerrit Heck , MD Age: 45 Referring MD:  Date of Birth: 02-20-1976 Gender: Female Account #: [id number] Procedure:                Colonoscopy Indications:              Screening for colorectal malignant neoplasm, This                            is the patient's first colonoscopy Medicines:                Monitored Anesthesia Care Procedure:                Pre-Anesthesia Assessment:                           - Prior to the procedure, a History and Physical                            was performed, and patient medications and                            allergies were reviewed. The patient's tolerance of                            previous anesthesia was also reviewed. The risks                            and benefits of the procedure and the sedation                            options and risks were discussed with the patient.                            All questions were answered, and informed consent                            was obtained. Prior Anticoagulants: The patient has                            taken no previous anticoagulant or antiplatelet                            agents. ASA Grade Assessment: II - A patient with                            mild systemic disease. After reviewing the risks                            and benefits, the patient was deemed in                            satisfactory condition to undergo the procedure.  After obtaining informed consent, the colonoscope                            was passed under direct vision. Throughout the                            procedure, the patient's blood pressure, pulse, and                            oxygen saturations were monitored continuously. The                            Olympus CF-[id number]) Colonoscope was                            introduced through the anus  and advanced to the the                            cecum, identified by appendiceal orifice and                            ileocecal valve. The colonoscopy was performed                            without difficulty. The patient tolerated the                            procedure well. The quality of the bowel                            preparation was good. The ileocecal valve,                            appendiceal orifice, and rectum were photographed. Scope In: 8:07:02 AM Scope Out: 8:26:02 AM Scope Withdrawal Time: 0 hours 16 minutes 50 seconds  Total Procedure Duration: 0 hours 19 minutes 0 seconds  Findings:                 The perianal and digital rectal examinations were                            normal.                           Six sessile polyps were found in the sigmoid colon                            (4), descending colon and transverse colon. The                            polyps were 2 to 5 mm in size. These polyps were                            removed with a cold snare. Resection and retrieval  were complete. Estimated blood loss was minimal.                           A few small-mouthed diverticula were found in the                            sigmoid colon, descending colon and transverse                            colon.                           The retroflexed view of the distal rectum and anal                            verge was normal and showed no anal or rectal                            abnormalities. Complications:            No immediate complications. Estimated Blood Loss:     Estimated blood loss was minimal. Impression:               - Six 2 to 5 mm polyps in the sigmoid colon, in the                            descending colon and in the transverse colon,                            removed with a cold snare. Resected and retrieved.                           - Diverticulosis in the sigmoid colon, in the                             descending colon and in the transverse colon.                           - The distal rectum and anal verge are normal on                            retroflexion view. Recommendation:           - Patient has a contact number available for                            emergencies. The signs and symptoms of potential                            delayed complications were discussed with the                            patient. Return to normal activities tomorrow.  Written discharge instructions were provided to the                            patient.                           - Resume previous diet.                           - Continue present medications.                           - Await pathology results.                           - Repeat colonoscopy for surveillance based on                            pathology results.                           - Return to GI office PRN. Gerrit Heck, MD 12/18/2020 8:34:30 AM

## 2020-12-18 NOTE — Progress Notes (Signed)
Called to room to assist during endoscopic procedure.  Patient ID and intended procedure confirmed with present staff. Received instructions for my participation in the procedure from the performing physician.  

## 2020-12-18 NOTE — Progress Notes (Signed)
Pt's states no medical or surgical changes since previsit or office visit.   VS taken by CW 

## 2020-12-19 ENCOUNTER — Ambulatory Visit (INDEPENDENT_AMBULATORY_CARE_PROVIDER_SITE_OTHER): Payer: No Payment, Other | Admitting: Licensed Clinical Social Worker

## 2020-12-19 DIAGNOSIS — F418 Other specified anxiety disorders: Secondary | ICD-10-CM

## 2020-12-20 ENCOUNTER — Other Ambulatory Visit: Payer: Self-pay

## 2020-12-20 ENCOUNTER — Encounter: Payer: Self-pay | Admitting: Internal Medicine

## 2020-12-20 ENCOUNTER — Ambulatory Visit: Payer: PRIVATE HEALTH INSURANCE | Attending: Internal Medicine | Admitting: Internal Medicine

## 2020-12-20 ENCOUNTER — Telehealth: Payer: Self-pay | Admitting: *Deleted

## 2020-12-20 ENCOUNTER — Telehealth: Payer: Self-pay

## 2020-12-20 VITALS — BP 124/80 | HR 104 | Resp 16 | Wt 188.6 lb

## 2020-12-20 DIAGNOSIS — Z289 Immunization not carried out for unspecified reason: Secondary | ICD-10-CM

## 2020-12-20 DIAGNOSIS — I10 Essential (primary) hypertension: Secondary | ICD-10-CM | POA: Diagnosis not present

## 2020-12-20 DIAGNOSIS — F17200 Nicotine dependence, unspecified, uncomplicated: Secondary | ICD-10-CM | POA: Diagnosis not present

## 2020-12-20 DIAGNOSIS — Z124 Encounter for screening for malignant neoplasm of cervix: Secondary | ICD-10-CM

## 2020-12-20 DIAGNOSIS — Z28311 Partially vaccinated for covid-19: Secondary | ICD-10-CM | POA: Insufficient documentation

## 2020-12-20 NOTE — Progress Notes (Signed)
Patient ID: Theresa Cantrell, female    DOB: 1976-04-06  MRN: [id number]  CC: Gynecologic Exam   Subjective: Theresa Cantrell is a 45 y.o. female who presents for pap Her concerns today include:  Patient with history of GERD, obesity,  PTSD, Bipolar 2, tob dep, CTS RT hand, family history of breast cancer in her mother   Patient had a hysterectomy at the age of 55 due to uterine prolapse.  However she saw her gynecologist in 2019 and had a Pap smear done that  was abnormal with ASCUS and HPV positive.  Colpo which came back okay.  Plan was for repeat Pap smear in 1 year but she did not follow-up at that time due to lack of insurance. Pt is G2P1 (miscarriage) Sexual activity/birth control: currently not sexually.  Had partial hysterectomy at age 69 due to prolapse uterus Vaginal discharge or itching: no Menstrual cycles: s/p hysterectomy Family history: Breast cancer in her mother at the age of 29. Has not been called as yet for MMG.  She plans to call and schedule herself.  HTN:  Started on Norvasc 6 wks ago on last visit.  She has been taking every night but forgot to take it last night. She limits salt in the foods No device to check BP  Tob dep: Her plan was to quit but the end of the wk from last visit.  She did not get the patches as yet because of fifnacial restraints.  Hopes to get them soon.  Still wants to quit.  HM:  Does not want COVID booster.  Had c-scope 2 days ago Patient Active Problem List   Diagnosis Date Noted  . COVID-19 vaccine series not completed 12/20/2020  . Influenza vaccine refused 11/07/2020  . Essential hypertension 11/07/2020  . Obesity (BMI 30.0-34.9) 11/07/2020  . Tobacco dependence 11/07/2020  . Gastroesophageal reflux disease without esophagitis 11/07/2020  . History of abnormal cervical Pap smear 11/07/2020  . History of bipolar disorder 11/07/2020  . Bipolar 2 disorder, major depressive episode ([place]) 06/12/2020  . PTSD (post-traumatic stress  disorder) 06/12/2020  . Chronic pain of left knee 10/05/2019  . 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 Neuropathy 02/07/2014  . Hot flashes 02/07/2014  . Weight loss 02/07/2014  . Anxiety      Current Outpatient Medications on File Prior to Visit  Medication Sig Dispense Refill  . amLODipine (NORVASC) 5 MG tablet Take 1 tablet (5 mg total) by mouth daily. 90 tablet 3  . ARIPiprazole (ABILIFY) 5 MG tablet Take 1 tablet (5 mg total) by mouth daily. 30 tablet 2  . diclofenac (VOLTAREN) 75 MG EC tablet Take 1 tablet (75 mg total) by mouth 2 (two) times daily as needed. May take this medication as needed once finished with the steroid taper 60 tablet 0  . diclofenac Sodium (VOLTAREN) 1 % GEL Apply 2 g topically 4 (four) times daily. 150 g 3  . famotidine (PEPCID) 20 MG tablet Take 1 tablet (20 mg total) by mouth 2 (two) times daily. To reduce stomach acid 60 tablet 2  . FLUoxetine (PROZAC) 20 MG capsule Take 1 capsule (20 mg total) by mouth daily. 30 capsule 2  . HYDROcodone-acetaminophen (NORCO) 5-325 MG tablet Take 1 tablet by mouth 2 (two) times daily as needed. 10 tablet 0  . hydrOXYzine (ATARAX/VISTARIL) 10 MG tablet Take 1 tablet (10 mg total) by mouth 3 (three) times daily as needed. 90 tablet 2  . meloxicam (  MOBIC) 15 MG tablet Take 15 mg by mouth daily as needed.    . methocarbamol (ROBAXIN) 500 MG tablet methocarbamol 500 mg tablet    . nicotine (NICODERM CQ - DOSED IN MG/24 HOURS) 14 mg/24hr patch Place 1 patch (14 mg total) onto the skin daily. 28 patch 1  . prazosin (MINIPRESS) 1 MG capsule Take 1 capsule (1 mg total) by mouth at bedtime. 30 capsule 2  . predniSONE (STERAPRED UNI-PAK 21 TAB) 10 MG (21) TBPK tablet Take as directed (Patient not taking: Reported on 12/18/2020) 21 tablet 0  . traZODone (DESYREL) 50 MG tablet Take 1 tablet (50 mg total) by mouth at bedtime as needed for sleep. 30 tablet 2  . valACYclovir (VALTREX) 1000 MG tablet Take 1 tablet by  mouth daily.     No current facility-administered medications on file prior to visit.    Allergies  Allergen Reactions  . Lamotrigine Hives    Social History   Socioeconomic History  . Marital status: Single    Spouse name: Not on file  . Number of children: 1  . Years of education: Not on file  . Highest education level: Some college, no degree  Occupational History  . Not on file  Tobacco Use  . Smoking status: Current Some Day Smoker    Packs/day: 0.25    Types: Cigarettes  . Smokeless tobacco: Never Used  Vaping Use  . Vaping Use: Never used  Substance and Sexual Activity  . Alcohol use: Yes    Alcohol/week: 2.0 standard drinks    Types: 1 Glasses of wine, 1 Cans of beer per week  . Drug use: No  . Sexual activity: Not Currently    Birth control/protection: Surgical  Other Topics Concern  . Not on file  Social History Narrative  . Not on file   Social Determinants of Health   Financial Resource Strain: Not on file  Food Insecurity: Not on file  Transportation Needs: Not on file  Physical Activity: Not on file  Stress: Not on file  Social Connections: Not on file  Intimate Partner Violence: Not on file    Family History  Problem Relation Age of Onset  . Hypertension Mother   . Cancer Mother        breast  . Anxiety disorder Mother   . Hypertension Father   . Schizophrenia Father   . Bipolar disorder Father   . Anxiety disorder Father   . Colon cancer Neg Hx   . Colon polyps Neg Hx   . Esophageal cancer Neg Hx   . Rectal cancer Neg Hx   . Stomach cancer Neg Hx     Past Surgical History:  Procedure Laterality Date  . ABDOMINAL HYSTERECTOMY    . CHOLECYSTECTOMY    . DILATION AND CURETTAGE OF UTERUS    . FOOT SURGERY Bilateral    two pins in each foot  . KNEE SURGERY      ROS: Review of Systems Negative except as stated above  PHYSICAL EXAM: BP 124/80   Pulse (!) 104   Resp 16   Wt 188 lb 9.6 oz (85.5 kg)   SpO2 97%   BMI 31.87  kg/m   Physical Exam  General appearance - alert, well appearing, and in no distress Mental status - alert, oriented to person, place, and time Breasts - breasts appear normal, no suspicious masses, no skin or nipple changes or axillary nodes Pelvic -normal-appearing vaginal cuff.  She does not have a  cervix.  No significant discharge in the vaginal vault.  No adnexal masses on manual exam.  My CMA Ms. Terrilee Croak was present for breast and pelvic exam.  CMP Latest Ref Rng & Units 11/07/2020 06/11/2019 03/31/2018  Glucose 65 - 99 mg/dL 100(H) 97 96  BUN 6 - 24 mg/dL 17 16 11   Creatinine 0.57 - 1.00 mg/dL 1.00 0.93 0.67  Sodium 134 - 144 mmol/L 147(H) 139 141  Potassium 3.5 - 5.2 mmol/L 4.2 3.6 4.4  Chloride 96 - 106 mmol/L 107(H) 104 106  CO2 20 - 29 mmol/L 28 24 22   Calcium 8.7 - 10.2 mg/dL 9.9 9.2 9.2  Total Protein 6.0 - 8.5 g/dL 6.9 - 6.5  Total Bilirubin 0.0 - 1.2 mg/dL <0.2 - <0.2  Alkaline Phos 44 - 121 IU/L 112 - 86  AST 0 - 40 IU/L 14 - 12  ALT 0 - 32 IU/L 14 - 10   Lipid Panel     Component Value Date/Time   CHOL 165 11/07/2020 1610   TRIG 84 11/07/2020 1610   HDL 63 11/07/2020 1610   CHOLHDL 2.6 11/07/2020 1610   LDLCALC 86 11/07/2020 1610    CBC    Component Value Date/Time   WBC 8.2 11/07/2020 1610   WBC 9.2 06/11/2019 0301   RBC 4.66 11/07/2020 1610   RBC 4.37 06/11/2019 0301   HGB 14.2 11/07/2020 1610   HCT 42.3 11/07/2020 1610   PLT 250 11/07/2020 1610   MCV 91 11/07/2020 1610   MCH 30.5 11/07/2020 1610   MCH 30.9 06/11/2019 0301   MCHC 33.6 11/07/2020 1610   MCHC 33.5 06/11/2019 0301   RDW 12.5 11/07/2020 1610    ASSESSMENT AND PLAN:  1. Pap smear for cervical cancer screening If this is normal, she will not need to have any further Pap smears as she has had a hysterectomy for noncancerous reason. - Cervicovaginal ancillary only - Cytology - PAP  2. Tobacco dependence Advised to quit.  Patient still wanting to quit.  I have encouraged her to purchase  the nicotine patches when she is able to afford.  Less than 5 minutes spent on counseling.  3. Essential hypertension Repeat blood pressure closer to goal today.  Continue Norvasc  4. COVID-19 vaccine series not completed Patient declines getting COVID booster   Patient was given the opportunity to ask questions.  Patient verbalized understanding of the plan and was able to repeat key elements of the plan.   No orders of the defined types were placed in this encounter.    Requested Prescriptions    No prescriptions requested or ordered in this encounter    Return in about 4 months (around 04/19/2021).  Karle Plumber, MD, FACP

## 2020-12-20 NOTE — Progress Notes (Signed)
   THERAPIST PROGRESS NOTE  Session Time: 55 min  Participation Level: Active  Behavioral Response: CasualAlertAnxious and Depressed  Type of Therapy: Individual Therapy  Treatment Goals addressed: Communication: Depression/Anxiety/Coping  Interventions: Supportive and Other: additional assessment  Summary: Theresa Cantrell is a 45 y.o. female who presents with hx of Bipolar Disorder/dep/anx. This date pt comes for in person session. This is the first session since initial eval done 08/05/20. Pt cancelled at least 2 appts. 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 She states the job is going well, likes it and getting along with everyone. 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 She is insightful about how long she can sustain these hours but is trying to get her finances in better order. She reports she got behind on bills after her unemployment ran out and was grateful to find work. She continues to live in apartment with dtr living in. She reports their relationship remains good, don't see one another much d/t each other's schedules. LCSW assessed for pt's physical health status as she has multiple chronic health conditions. Pt states she is "pretty good" right now. Had first screening colonoscopy yesterday with some polyps removed so awaiting those results. Commended pt for having screening. Pt taking MH meds as prescribed with exception of sleep med since she started working. Pt reports she is afraid she will over sleep and be late for work. She plans to discuss with med provider. She reports problems with hallucinations are much better managed and believes working is part of the improvment. She states her anx is currently "4-5" and dep "7-8" on a 0-10 scale with 10 the worst. LCSW assessed for sources of dep, acknowledging coping with past identified  as a need in Oct. Pt confirms and reports she is also struggling with "loneliness". Pt has not had a partner in over 5 yrs. She is on a couple of dating sites. She also reports her great uncle just had to be hospitalized so she is trying to keep up with this situation and her mom both in [place]. Pt states she is in less contact with father/setting more boundaries. Assessed for coping skills/strategies pt currently has. Pt denies any specific coping skills. LCSW instructed on deep breathing and showed Daily Calm. Pt willing to trying both and learning more. Discussed poc including scheduling prior to close of session, encouraging consistency. Pt states desire to follow up asap and appreciation for care.     Suicidal/Homicidal: Nowithout intent/plan  Therapist Response: Pt receptive to care.  Plan: Return again for next avail appt or walk in prn.  Diagnosis: Axis I: Depression with anxiety    Axis II: Deferred  Hermine Messick, LCSW 12/20/2020

## 2020-12-20 NOTE — Telephone Encounter (Signed)
Follow up call made. 

## 2020-12-20 NOTE — Telephone Encounter (Signed)
Second follow up call, no answer LM

## 2020-12-23 LAB — CERVICOVAGINAL ANCILLARY ONLY
Bacterial Vaginitis (gardnerella): POSITIVE — AB
Candida Glabrata: NEGATIVE
Candida Vaginitis: NEGATIVE
Chlamydia: NEGATIVE
Comment: NEGATIVE
Comment: NEGATIVE
Comment: NEGATIVE
Comment: NEGATIVE
Comment: NEGATIVE
Comment: NORMAL
Neisseria Gonorrhea: NEGATIVE
Trichomonas: NEGATIVE

## 2020-12-24 ENCOUNTER — Other Ambulatory Visit: Payer: Self-pay | Admitting: Internal Medicine

## 2020-12-24 DIAGNOSIS — R87629 Unspecified abnormal cytological findings in specimens from vagina: Secondary | ICD-10-CM

## 2020-12-24 LAB — CYTOLOGY - PAP
Comment: NEGATIVE
Diagnosis: UNDETERMINED — AB
High risk HPV: NEGATIVE

## 2020-12-24 MED ORDER — METRONIDAZOLE 500 MG PO TABS: 500.0000 mg | ORAL_TABLET | Freq: Two times a day (BID) | ORAL | 0 refills | Status: DC

## 2020-12-25 ENCOUNTER — Encounter: Payer: Self-pay | Admitting: Gastroenterology

## 2020-12-26 ENCOUNTER — Encounter: Payer: Self-pay | Admitting: Orthopaedic Surgery

## 2020-12-26 ENCOUNTER — Ambulatory Visit (INDEPENDENT_AMBULATORY_CARE_PROVIDER_SITE_OTHER): Payer: PRIVATE HEALTH INSURANCE

## 2020-12-26 ENCOUNTER — Ambulatory Visit (INDEPENDENT_AMBULATORY_CARE_PROVIDER_SITE_OTHER): Payer: PRIVATE HEALTH INSURANCE | Admitting: Orthopaedic Surgery

## 2020-12-26 DIAGNOSIS — M25562 Pain in left knee: Secondary | ICD-10-CM

## 2020-12-26 DIAGNOSIS — G8929 Other chronic pain: Secondary | ICD-10-CM | POA: Diagnosis not present

## 2020-12-26 MED ORDER — BUPIVACAINE HCL 0.25 % IJ SOLN
2.0000 mL | INTRAMUSCULAR | Status: AC | PRN
  Administered 2020-12-26: 2 mL via INTRA_ARTICULAR

## 2020-12-26 MED ORDER — LIDOCAINE HCL 1 % IJ SOLN
2.0000 mL | INTRAMUSCULAR | Status: AC | PRN
  Administered 2020-12-26: 2 mL

## 2020-12-26 NOTE — Progress Notes (Signed)
Office Visit Note   Patient: Theresa Cantrell           Date of Birth: Sep 12, 1976           MRN: [id number] Visit Date: 12/26/2020              Requested by: Ladell Pier, MD [address] [place],  [place] [address] PCP: Ladell Pier, MD   Assessment & Plan: Visit Diagnoses:  1. Chronic pain of left knee     Plan: Impression is left knee arthritis flareup.  We discussed various treatment options to include intra-articular Toradol injection for which she would like to proceed.  She will follow up with [place] as needed.  Follow-Up Instructions: No follow-ups on file.   Orders:  Orders Placed This Encounter  Procedures  . XR KNEE 3 VIEW LEFT   No orders of the defined types were placed in this encounter.     Procedures: Large Joint Inj: L knee on 12/26/2020 10:02 AM Indications: pain Details: 22 G needle, anterolateral approach Medications: 2 mL lidocaine 1 %; 2 mL bupivacaine 0.25 %      Clinical Data: No additional findings.   Subjective: Chief Complaint  Patient presents with  . Left Knee - Pain    HPI patient is a pleasant 45 year old female who comes in today with recurrent left knee pain.  She is status post left knee patellofemoral uniarthroplasty.  She has been having intermittent pain to the medial lateral aspects.  Pain she has is primarily with walking as well as when she is sitting or lying down.  She has associated cramping into her calf.  She has been taking NSAIDs without much relief of symptoms.  No previous cortisone injection due to her underlying arthroplasty.  Review of Systems as detailed in HPI.  All others reviewed and are negative.   Objective: Vital Signs: There were no vitals taken for this visit.  Physical Exam well-developed well-nourished female no acute distress.  Alert oriented x3.  Ortho Exam left knee exam shows no effusion.  Range of motion 0 to 95 degrees.  Medial and lateral joint line.  Ligaments are stable.  She  is neurovascular intact distally.  Specialty Comments:  No specialty comments available.  Imaging: XR KNEE 3 VIEW LEFT  Result Date: 12/26/2020 Mild joint space narrowing with osteophyte formation to the medial and lateral aspects.  Well-seated arthroplasty without complication.    PMFS History: Patient Active Problem List   Diagnosis Date Noted  . COVID-19 vaccine series not completed 12/20/2020  . Influenza vaccine refused 11/07/2020  . Essential hypertension 11/07/2020  . Obesity (BMI 30.0-34.9) 11/07/2020  . Tobacco dependence 11/07/2020  . Gastroesophageal reflux disease without esophagitis 11/07/2020  . History of abnormal cervical Pap smear 11/07/2020  . History of bipolar disorder 11/07/2020  . Bipolar 2 disorder, major depressive episode ([place]) 06/12/2020  . PTSD (post-traumatic stress disorder) 06/12/2020  . Chronic pain of left knee 10/05/2019  . 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 Neuropathy 02/07/2014  . Hot flashes 02/07/2014  . Weight loss 02/07/2014  . Anxiety    Past Medical History:  Diagnosis Date  . Anxiety   . Arthritis   . Bipolar 1 disorder ([place])   . Depression   . Hepatitis C   . Hypertension   . PTSD (post-traumatic stress disorder)     Family History  Problem Relation Age of Onset  . Hypertension Mother   . Cancer  Mother        breast  . Anxiety disorder Mother   . Hypertension Father   . Schizophrenia Father   . Bipolar disorder Father   . Anxiety disorder Father   . Colon cancer Neg Hx   . Colon polyps Neg Hx   . Esophageal cancer Neg Hx   . Rectal cancer Neg Hx   . Stomach cancer Neg Hx     Past Surgical History:  Procedure Laterality Date  . ABDOMINAL HYSTERECTOMY    . CHOLECYSTECTOMY    . DILATION AND CURETTAGE OF UTERUS    . FOOT SURGERY Bilateral    two pins in each foot  . KNEE SURGERY     Social History   Occupational History  . Not on file  Tobacco Use  . Smoking status: Current  Some Day Smoker    Packs/day: 0.25    Types: Cigarettes  . Smokeless tobacco: Never Used  Vaping Use  . Vaping Use: Never used  Substance and Sexual Activity  . Alcohol use: Yes    Alcohol/week: 2.0 standard drinks    Types: 1 Glasses of wine, 1 Cans of beer per week  . Drug use: No  . Sexual activity: Not Currently    Birth control/protection: Surgical

## 2021-01-08 ENCOUNTER — Ambulatory Visit (INDEPENDENT_AMBULATORY_CARE_PROVIDER_SITE_OTHER): Payer: No Payment, Other | Admitting: Psychiatry

## 2021-01-08 ENCOUNTER — Other Ambulatory Visit: Payer: Self-pay

## 2021-01-08 ENCOUNTER — Encounter (HOSPITAL_COMMUNITY): Payer: Self-pay | Admitting: Psychiatry

## 2021-01-08 DIAGNOSIS — F419 Anxiety disorder, unspecified: Secondary | ICD-10-CM

## 2021-01-08 DIAGNOSIS — F4310 Post-traumatic stress disorder, unspecified: Secondary | ICD-10-CM

## 2021-01-08 DIAGNOSIS — F17200 Nicotine dependence, unspecified, uncomplicated: Secondary | ICD-10-CM | POA: Diagnosis not present

## 2021-01-08 DIAGNOSIS — F3181 Bipolar II disorder: Secondary | ICD-10-CM | POA: Diagnosis not present

## 2021-01-08 MED ORDER — TRAZODONE HCL 50 MG PO TABS: 50.0000 mg | ORAL_TABLET | Freq: Every evening | ORAL | 2 refills | Status: DC | PRN

## 2021-01-08 MED ORDER — FLUOXETINE HCL 20 MG PO CAPS: 20.0000 mg | ORAL_CAPSULE | Freq: Every day | ORAL | 2 refills | Status: DC

## 2021-01-08 MED ORDER — HYDROXYZINE HCL 10 MG PO TABS: 10.0000 mg | ORAL_TABLET | Freq: Three times a day (TID) | ORAL | 2 refills | Status: DC | PRN

## 2021-01-08 MED ORDER — PRAZOSIN HCL 1 MG PO CAPS
1.0000 mg | ORAL_CAPSULE | Freq: Every day | ORAL | 2 refills | Status: DC
Start: 2021-01-08 — End: 2021-04-03

## 2021-01-08 MED ORDER — ARIPIPRAZOLE (SENSOR) 10 MG PO TABS: 10.0000 mg | ORAL_TABLET | Freq: Every day | ORAL | 2 refills | Status: DC

## 2021-01-08 MED ORDER — NICOTINE 14 MG/24HR TD PT24: 14.0000 mg | MEDICATED_PATCH | Freq: Every day | TRANSDERMAL | 1 refills | Status: DC

## 2021-01-08 NOTE — Progress Notes (Signed)
BH MD/PA/NP OP Progress Note      01/08/2021 5:14 PM Theresa Cantrell  MRN:  [id number]  Chief Complaint: " Things are so so"   HPI: 45 year old female seen today for follow up psychiatric evaluation.  She has a psychiatric history of  Bipolar, depression, anxiety, and PTSD.  She is currently being managed on Abilify 5 mg daily, prazosin 1 mg nightly, trazodone 25 mg to 50 mg as needed sleep, hydroxyzine 10 mg three times daily, and Prozac 20 mg daily.  Patient notes that her medications are some effective in managing her psychiatric conditions.  Today she is is pleasant, cooperative, calm, well groomed, engaged in conversation, and maintains eye contact.  She informed provider that things have been so so since her last visit. 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 She notes that since starting the job and working at [employer] she has been more tired. She asked provider if her fatigue and sedation could be related to her medications. Provider informed patient that hydroxyzine and Trazodone could contribute to her symptoms. Patient notes that she sleeps 7-8 hours most days. She endorses adequate appetite noting that she has changes her diet and has lost 10 pounds. She also endorse AVH (noting she hear chatter and occassionally sees people). She also notes that at times she is irritable, distractible, and has fluctuations in med.  Patient notes that last week her anxiety and depression worsened. She notes that she called out from work on Thursday and Friday and considered checking her self into [place]. She not that she had a nightmare that exacerbated her anxiety and depression. She notes that she dreamed about being in a car accident and notes that she saw her funeral in the dream.  She reports that her friend comforted her and she decided not to be seen at [facility].   Today patient is agreeable to increase Abilify 5 mg to 10 mg to help manage depression  and psychosis. Patient instructed to take trazodone as needed or cut in half to reduce sedation. Provider also informed patient that she can cut hydroxyzine in half if its over sedating. She endorsed understanding and agreed. No other concerns noted at this time.  Visit Diagnosis:    ICD-10-CM   1. Bipolar 2 disorder, major depressive episode (HCC)  F31.81 ARIPiprazole 10 MG TABS    FLUoxetine (PROZAC) 20 MG capsule    traZODone (DESYREL) 50 MG tablet  2. Anxiety  F41.9 hydrOXYzine (ATARAX/VISTARIL) 10 MG tablet  3. PTSD (post-traumatic stress disorder)  F43.10 prazosin (MINIPRESS) 1 MG capsule    Past Psychiatric History:  Bipolar, depression, anxiety, and PTSD.  Past Medical History:  Past Medical History:  Diagnosis Date  . Anxiety   . Arthritis   . Bipolar 1 disorder ([place])   . Depression   . Hepatitis C   . Hypertension   . PTSD (post-traumatic stress disorder)     Past Surgical History:  Procedure Laterality Date  . ABDOMINAL HYSTERECTOMY    . CHOLECYSTECTOMY    . DILATION AND CURETTAGE OF UTERUS    . FOOT SURGERY Bilateral    two pins in each foot  . KNEE SURGERY      Family Psychiatric History: Mother Bipolar and depression, father schizophrenia and depression, bipolar.  Family History:  Family History  Problem Relation Age of Onset  . Hypertension Mother   . Cancer Mother  breast  . Anxiety disorder Mother   . Hypertension Father   . Schizophrenia Father   . Bipolar disorder Father   . Anxiety disorder Father   . Colon cancer Neg Hx   . Colon polyps Neg Hx   . Esophageal cancer Neg Hx   . Rectal cancer Neg Hx   . Stomach cancer Neg Hx     Social History:  Social History   Socioeconomic History  . Marital status: Single    Spouse name: Not on file  . Number of children: 1  . Years of education: Not on file  . Highest education level: Some college, no degree  Occupational History  . Not on file  Tobacco Use  . Smoking status: Current Some  Day Smoker    Packs/day: 0.25    Types: Cigarettes  . Smokeless tobacco: Never Used  Vaping Use  . Vaping Use: Never used  Substance and Sexual Activity  . Alcohol use: Yes    Alcohol/week: 2.0 standard drinks    Types: 1 Glasses of wine, 1 Cans of beer per week  . Drug use: No  . Sexual activity: Not Currently    Birth control/protection: Surgical  Other Topics Concern  . Not on file  Social History Narrative  . Not on file   Social Determinants of Health   Financial Resource Strain: Not on file  Food Insecurity: Not on file  Transportation Needs: Not on file  Physical Activity: Not on file  Stress: Not on file  Social Connections: Not on file    Allergies:  Allergies  Allergen Reactions  . Lamotrigine Hives    Metabolic Disorder Labs: Lab Results  Component Value Date   HGBA1C 5.7 (H) 11/07/2020   No results found for: PROLACTIN Lab Results  Component Value Date   CHOL 165 11/07/2020   TRIG 84 11/07/2020   HDL 63 11/07/2020   CHOLHDL 2.6 11/07/2020   LDLCALC 86 11/07/2020   LDLCALC 63 03/31/2018   Lab Results  Component Value Date   TSH 0.484 03/31/2018   TSH 1.501 02/07/2014    Therapeutic Level Labs: No results found for: LITHIUM No results found for: VALPROATE No components found for:  CBMZ  Current Medications: Current Outpatient Medications  Medication Sig Dispense Refill  . amLODipine (NORVASC) 5 MG tablet Take 1 tablet (5 mg total) by mouth daily. 90 tablet 3  . ARIPiprazole 10 MG TABS Take 10 mg by mouth daily. 30 tablet 2  . diclofenac Sodium (VOLTAREN) 1 % GEL Apply 2 g topically 4 (four) times daily. 150 g 3  . famotidine (PEPCID) 20 MG tablet Take 1 tablet (20 mg total) by mouth 2 (two) times daily. To reduce stomach acid 60 tablet 2  . FLUoxetine (PROZAC) 20 MG capsule Take 1 capsule (20 mg total) by mouth daily. 30 capsule 2  . HYDROcodone-acetaminophen (NORCO) 5-325 MG tablet Take 1 tablet by mouth 2 (two) times daily as needed. 10  tablet 0  . hydrOXYzine (ATARAX/VISTARIL) 10 MG tablet Take 1 tablet (10 mg total) by mouth 3 (three) times daily as needed. 90 tablet 2  . meloxicam (MOBIC) 15 MG tablet Take 15 mg by mouth daily as needed.    . methocarbamol (ROBAXIN) 500 MG tablet methocarbamol 500 mg tablet    . metroNIDAZOLE (FLAGYL) 500 MG tablet Take 1 tablet (500 mg total) by mouth 2 (two) times daily. 14 tablet 0  . nicotine (NICODERM CQ - DOSED IN MG/24 HOURS) 14 mg/24hr patch  Place 1 patch (14 mg total) onto the skin daily. 28 patch 1  . prazosin (MINIPRESS) 1 MG capsule Take 1 capsule (1 mg total) by mouth at bedtime. 30 capsule 2  . predniSONE (STERAPRED UNI-PAK 21 TAB) 10 MG (21) TBPK tablet Take as directed (Patient not taking: Reported on 12/18/2020) 21 tablet 0  . traZODone (DESYREL) 50 MG tablet Take 1 tablet (50 mg total) by mouth at bedtime as needed for sleep. 30 tablet 2  . valACYclovir (VALTREX) 1000 MG tablet Take 1 tablet by mouth daily.     No current facility-administered medications for this visit.     Musculoskeletal: Strength & Muscle Tone: within normal limits Gait & Station: normal Patient leans: N/A  Psychiatric Specialty Exam: Review of Systems  There were no vitals taken for this visit.There is no height or weight on file to calculate BMI.  General Appearance: Well Groomed  Eye Contact:  Good  Speech:  Clear and Coherent and Normal Rate  Volume:  Normal  Mood:  Euthymic and Notes that she occasionally has anxiety and depression however reports that it has improved  Affect:  Congruent  Thought Process:  Coherent, Goal Directed and Linear  Orientation:  Full (Time, Place, and Person)  Thought Content: WDL and Logical   Suicidal Thoughts:  No  Homicidal Thoughts:  No  Memory:  Immediate;   Good Recent;   Good Remote;   Good  Judgement:  Good  Insight:  Good  Psychomotor Activity:  Normal  Concentration:  Concentration: Good and Attention Span: Good  Recall:  Good  Fund of  Knowledge: Good  Language: Good  Akathisia:  No  Handed:  Right  AIMS (if indicated): Not done  Assets:  Communication Skills Desire for Improvement Financial Resources/Insurance Housing Social Support  ADL's:  Intact  Cognition: WNL  Sleep:  Good   Screenings: GAD-7   Flowsheet Row Clinical Support from 01/08/2021 in [facility] Office Visit from 12/20/2020 in [place] Office Visit from 11/07/2020 in [place] from 10/10/2020 in [facility]  Total GAD-7 Score 8 4 13 12     PHQ2-9   Flowsheet Row Clinical Support from 01/08/2021 in [facility] Office Visit from 12/20/2020 in [place] Office Visit from 11/07/2020 in [place] from 10/10/2020 in [facility] Office Visit from 09/05/2020 in [place]  PHQ-2 Total Score 3 3 4 5  0  PHQ-9 Total Score 7 5 8 10  --    Flowsheet Row Clinical Support from 01/08/2021 in [place] and Plan: Patient reports that she is doing well on her current medication regimen however endorses [facility] and sedation.   Today patient is agreeable to increase Abilify 5 mg to 10 mg to help manage depression and psychosis. Patient instructed to take trazodone as needed or cut in half to reduce sedation. Provider also informed patient that she can cut hydroxyzine in half if its over sedating. She endorsed understanding and agreed.  1. Bipolar 2 disorder, major depressive episode (HCC)  Increased- ARIPiprazole 10 MG TABS; Take 10 mg by mouth daily.  Dispense: 30 tablet; Refill: 2 Continue- FLUoxetine (PROZAC) 20 MG capsule; Take 1 capsule (20 mg total) by mouth daily.  Dispense:  30 capsule; Refill:  2 Continue- traZODone (DESYREL) 50 MG tablet; Take 1 tablet (50 mg total) by mouth at bedtime as needed for sleep.  Dispense: 30 tablet; Refill: 2  2. Anxiety  Continue- hydrOXYzine (ATARAX/VISTARIL) 10 MG tablet; Take 1 tablet (10 mg total) by mouth 3 (three) times daily as needed.  Dispense: 90 tablet; Refill: 2  3. PTSD (post-traumatic stress disorder)  Continue- prazosin (MINIPRESS) 1 MG capsule; Take 1 capsule (1 mg total) by mouth at bedtime.  Dispense: 30 capsule; Refill: 2   Follow-up in 3 months Follow-up with therapy  Salley Slaughter, NP 01/08/2021, 5:14 PM

## 2021-01-13 ENCOUNTER — Other Ambulatory Visit: Payer: Self-pay

## 2021-01-13 ENCOUNTER — Ambulatory Visit (INDEPENDENT_AMBULATORY_CARE_PROVIDER_SITE_OTHER): Payer: PRIVATE HEALTH INSURANCE

## 2021-01-13 ENCOUNTER — Encounter: Payer: Self-pay | Admitting: Physician Assistant

## 2021-01-13 ENCOUNTER — Ambulatory Visit (INDEPENDENT_AMBULATORY_CARE_PROVIDER_SITE_OTHER): Payer: PRIVATE HEALTH INSURANCE | Admitting: Orthopaedic Surgery

## 2021-01-13 DIAGNOSIS — M5412 Radiculopathy, cervical region: Secondary | ICD-10-CM | POA: Diagnosis not present

## 2021-01-13 MED ORDER — PREDNISONE 10 MG (21) PO TBPK: ORAL_TABLET | ORAL | 0 refills | Status: DC

## 2021-01-13 MED ORDER — METHOCARBAMOL 500 MG PO TABS: 500.0000 mg | ORAL_TABLET | Freq: Two times a day (BID) | ORAL | 0 refills | Status: DC | PRN

## 2021-01-13 NOTE — Progress Notes (Signed)
Office Visit Note   Patient: Theresa Cantrell           Date of Birth: 06/17/1976           MRN: [id number] Visit Date: 01/13/2021              Requested by: Ladell Pier, MD [address] [place],  [place] [address] PCP: Ladell Pier, MD   Assessment & Plan: Visit Diagnoses:  1. Radiculopathy of cervical spine     Plan: Impression is cervical spine radiculopathy.  At this point, will start the patient on a steroid taper muscle relaxer as well as submit a referral for physical therapy.  She will follow up with [place] as needed.  Follow-Up Instructions: Return if symptoms worsen or fail to improve.   Orders:  Orders Placed This Encounter  Procedures  . XR Cervical Spine 2 or 3 views  . Ambulatory referral to Physical Therapy   Meds ordered this encounter  Medications  . predniSONE (STERAPRED UNI-PAK 21 TAB) 10 MG (21) TBPK tablet    Sig: Take as directed    Dispense:  21 tablet    Refill:  0  . methocarbamol (ROBAXIN) 500 MG tablet    Sig: Take 1 tablet (500 mg total) by mouth 2 (two) times daily as needed.    Dispense:  20 tablet    Refill:  0      Procedures: No procedures performed   Clinical Data: No additional findings.   Subjective: Chief Complaint  Patient presents with  . Right Hand - Pain  . Left Hand - Pain    HPI patient is a pleasant 45 year old female who comes in today with continued paresthesias to the thumb, index and long fingers of both hands right greater than left.  She also notes the symptoms radiate up into both arms.  She has been dealing with this for a while.  Nerve conduction studies of the right upper extremity were normal.  Diagnostic carpal tunnel injections were performed to both wrists which were of no benefit.  No history of neck pathology.  Review of Systems as detailed in HPI.  All others reviewed and are negative.   Objective: Vital Signs: There were no vitals taken for this visit.  Physical Exam well-developed  well-nourished female no acute distress.  Alert and oriented x3.  Ortho Exam cervical spine exam shows no spinous or paraspinous tenderness.  She does have marked tenderness along the parascapular region both sides.  She has pain with cervical spine rotation to the right.  no focal weakness.  She is neurovascular intact distally.  Specialty Comments:  No specialty comments available.  Imaging: XR Cervical Spine 2 or 3 views  Result Date: 01/13/2021 X-rays demonstrate multilevel spondylosis worse C4-5, C5-6 and C6-7    PMFS History: Patient Active Problem List   Diagnosis Date Noted  . COVID-19 vaccine series not completed 12/20/2020  . Influenza vaccine refused 11/07/2020  . Essential hypertension 11/07/2020  . Obesity (BMI 30.0-34.9) 11/07/2020  . Tobacco dependence 11/07/2020  . Gastroesophageal reflux disease without esophagitis 11/07/2020  . History of abnormal cervical Pap smear 11/07/2020  . History of bipolar disorder 11/07/2020  . Bipolar 2 disorder, major depressive episode ([place]) 06/12/2020  . PTSD (post-traumatic stress disorder) 06/12/2020  . Chronic pain of left knee 10/05/2019  . 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 Neuropathy 02/07/2014  . Hot flashes 02/07/2014  . Weight loss 02/07/2014  . Anxiety  Past Medical History:  Diagnosis Date  . Anxiety   . Arthritis   . Bipolar 1 disorder ([place])   . Depression   . Hepatitis C   . Hypertension   . PTSD (post-traumatic stress disorder)     Family History  Problem Relation Age of Onset  . Hypertension Mother   . Cancer Mother        breast  . Anxiety disorder Mother   . Hypertension Father   . Schizophrenia Father   . Bipolar disorder Father   . Anxiety disorder Father   . Colon cancer Neg Hx   . Colon polyps Neg Hx   . Esophageal cancer Neg Hx   . Rectal cancer Neg Hx   . Stomach cancer Neg Hx     Past Surgical History:  Procedure Laterality Date  . ABDOMINAL  HYSTERECTOMY    . CHOLECYSTECTOMY    . DILATION AND CURETTAGE OF UTERUS    . FOOT SURGERY Bilateral    two pins in each foot  . KNEE SURGERY     Social History   Occupational History  . Not on file  Tobacco Use  . Smoking status: Current Some Day Smoker    Packs/day: 0.25    Types: Cigarettes  . Smokeless tobacco: Never Used  Vaping Use  . Vaping Use: Never used  Substance and Sexual Activity  . Alcohol use: Yes    Alcohol/week: 2.0 standard drinks    Types: 1 Glasses of wine, 1 Cans of beer per week  . Drug use: No  . Sexual activity: Not Currently    Birth control/protection: Surgical

## 2021-01-20 ENCOUNTER — Telehealth: Payer: Self-pay

## 2021-01-20 NOTE — Telephone Encounter (Signed)
Patient called stating that she is in a lot of pain with her shoulder and that the Prednisone and muscle relaxer did not help.  Stated that her PT appointment is not until Theresa Cantrell, 2022.  Would like a call back to discuss?  CB# [phone].  Please advise.  Thank you.

## 2021-01-21 ENCOUNTER — Telehealth: Payer: Self-pay | Admitting: Orthopaedic Surgery

## 2021-01-21 ENCOUNTER — Other Ambulatory Visit: Payer: Self-pay

## 2021-01-21 ENCOUNTER — Other Ambulatory Visit: Payer: Self-pay | Admitting: Physician Assistant

## 2021-01-21 DIAGNOSIS — M5412 Radiculopathy, cervical region: Secondary | ICD-10-CM

## 2021-01-21 MED ORDER — HYDROCODONE-ACETAMINOPHEN 5-325 MG PO TABS: 1.0000 | ORAL_TABLET | Freq: Two times a day (BID) | ORAL | 0 refills | Status: DC | PRN

## 2021-01-21 NOTE — Telephone Encounter (Signed)
Patient called requesting pain medication for severe pains. Please send to [facility] on [address]. Please call patient when MRI referral has been sent and new Physical therapy facility. Patient phone number is [phone].

## 2021-01-21 NOTE — Telephone Encounter (Signed)
MRI order made

## 2021-01-21 NOTE — Telephone Encounter (Signed)
Please send Rx to pharm.  Pending MRI and Pending PT

## 2021-01-21 NOTE — Telephone Encounter (Signed)
Spoke with pt and she is ok with holding until her PT appt that is scheduled for April and would like to get a sooner appt for MRI, I changed the location for MRI to [facility].

## 2021-01-21 NOTE — Telephone Encounter (Signed)
Can we refer her to a different pt location?  Also, lets go ahead and get mri cervical spine

## 2021-01-21 NOTE — Telephone Encounter (Signed)
Patient aware.

## 2021-01-21 NOTE — Telephone Encounter (Signed)
Theresa Cantrell, can we send somewhere else. Maybe they can see him sooner?

## 2021-01-21 NOTE — Telephone Encounter (Signed)
Sent in

## 2021-01-26 ENCOUNTER — Encounter: Payer: Self-pay | Admitting: Internal Medicine

## 2021-01-26 NOTE — Progress Notes (Signed)
She had 3D mammogram through [facility] mammography on 01/09/2021.  Mammogram was normal.  Recommend repeat in 1 year.

## 2021-01-28 ENCOUNTER — Encounter: Payer: Self-pay | Admitting: Orthopaedic Surgery

## 2021-01-28 ENCOUNTER — Other Ambulatory Visit: Payer: Self-pay | Admitting: Physician Assistant

## 2021-01-28 MED ORDER — DICLOFENAC SODIUM 75 MG PO TBEC: 75.0000 mg | DELAYED_RELEASE_TABLET | Freq: Two times a day (BID) | ORAL | 2 refills | Status: DC | PRN

## 2021-01-28 MED ORDER — TRAMADOL HCL 50 MG PO TABS: 50.0000 mg | ORAL_TABLET | Freq: Three times a day (TID) | ORAL | 0 refills | Status: DC | PRN

## 2021-01-28 NOTE — Telephone Encounter (Signed)
Sent in

## 2021-01-28 NOTE — Telephone Encounter (Signed)
Can you let her know we can start her on an nsaid in place of steroid and that we cannot refill narcotic.  Happy to call in tramadol if she would like

## 2021-02-01 ENCOUNTER — Other Ambulatory Visit: Payer: Self-pay

## 2021-02-01 ENCOUNTER — Ambulatory Visit (HOSPITAL_BASED_OUTPATIENT_CLINIC_OR_DEPARTMENT_OTHER)
Admission: RE | Admit: 2021-02-01 | Discharge: 2021-02-01 | Disposition: A | Discharge status: Home / Self Care | Payer: PRIVATE HEALTH INSURANCE | Source: Ambulatory Visit | Attending: Orthopaedic Surgery | Admitting: Orthopaedic Surgery

## 2021-02-01 ENCOUNTER — Ambulatory Visit: Payer: PRIVATE HEALTH INSURANCE | Admitting: Rehabilitative and Restorative Service Providers"

## 2021-02-01 DIAGNOSIS — M5412 Radiculopathy, cervical region: Secondary | ICD-10-CM | POA: Insufficient documentation

## 2021-02-04 ENCOUNTER — Ambulatory Visit (INDEPENDENT_AMBULATORY_CARE_PROVIDER_SITE_OTHER): Payer: PRIVATE HEALTH INSURANCE | Admitting: Orthopaedic Surgery

## 2021-02-04 ENCOUNTER — Encounter: Payer: Self-pay | Admitting: Orthopaedic Surgery

## 2021-02-04 DIAGNOSIS — M5412 Radiculopathy, cervical region: Secondary | ICD-10-CM | POA: Diagnosis not present

## 2021-02-04 MED ORDER — PREDNISONE 10 MG (21) PO TBPK: ORAL_TABLET | ORAL | 0 refills | Status: DC

## 2021-02-04 NOTE — Progress Notes (Signed)
Office Visit Note   Patient: Theresa Cantrell           Date of Birth: [birth year]           MRN: [id number] Visit Date: 02/04/2021              Requested by: Ladell Pier, MD [address] [place],  [place] [address] PCP: Ladell Pier, MD   Assessment & Plan: Visit Diagnoses:  1. Radiculopathy of cervical spine     Plan: Impression is chronic neck pain and bilateral upper extremity paresthesias with evidence of disc extrusion at C5-6 contacting and flattening the right hemicord, left paracentral disc osteophyte complex C4-5 with mild flattening of the left hemicord, disc bulging with hypertrophy at C3-4 with mild canal mild to moderate left greater than right C4 foraminal stenosis and disc osteophyte C6-7 with mild canal and moderate right and mild left C7 foraminal stenosis.  At this point, I believe it would be appropriate to refer her to Dr. Ernestina Patches for epidural steroid injection.  I have also provided her with a new hard copy prescription for physical therapy which she will bring to her the location of her choice.  In regards to her back, I believe she has a muscle strain and possibly some sciatica going on.  I have called in a steroid taper.  She still has Robaxin at home which she will take as needed.  We will include her back on her physical therapy prescription.  Follow-up with [place] as needed.  Follow-Up Instructions: Return if symptoms worsen or fail to improve.   Orders:  Orders Placed This Encounter  Procedures  . Ambulatory referral to Physical Medicine Rehab   Meds ordered this encounter  Medications  . predniSONE (STERAPRED UNI-PAK 21 TAB) 10 MG (21) TBPK tablet    Sig: Take as directed    Dispense:  21 tablet    Refill:  0      Procedures: No procedures performed   Clinical Data: No additional findings.   Subjective: Chief Complaint  Patient presents with  . Neck - Pain    HPI patient is a very pleasant 45 year old female who comes in today to  discuss MRI results of her cervical spine.  She has been dealing with chronic neck pain and bilateral upper extremity paresthesias for a while.  Recent MRI of the cervical spine shows a disc extrusion at C5-6 contacting and flattening the right hemicord, left paracentral disc osteophyte complex C4-5 with mild flattening of the left hemicord, disc bulging with hypertrophy at C3-4 with mild canal mild to moderate left greater than right C4 foraminal stenosis and disc osteophyte C6-7 with mild canal and moderate right and mild left C7 foraminal stenosis.  She has not been to physical therapy yet as she has been trying to find a location that accepted her insurance.  She has recently found 1 and is requesting another referral today.  Other issue she brings up is right and middle lower back pain for the past few days.  No new injury or change in activity.  The pain does seem to radiate down the left leg.  She denies any weakness to either lower extremity.  No paresthesias.  Pain is worse with bending over.  No bowel or bladder change or saddle paresthesias.  Review of Systems as detailed in HPI.  All others reviewed and are negative.   Objective: Vital Signs: There were no vitals taken for this visit.  Physical Exam  well-developed well-nourished female no acute distress.  Alert oriented x3.  Ortho Exam unchanged cervical spine exam.  Lumbar spine exam shows no spinous or paraspinous tenderness.  Negative straight leg raise and negative logroll both sides.  No focal weakness.  She is neurovascular intact distally.  Specialty Comments:  No specialty comments available.  Imaging: No new imaging   PMFS History: Patient Active Problem List   Diagnosis Date Noted  . COVID-19 vaccine series not completed 12/20/2020  . Influenza vaccine refused 11/07/2020  . Essential hypertension 11/07/2020  . Obesity (BMI 30.0-34.9) 11/07/2020  . Tobacco dependence 11/07/2020  . Gastroesophageal reflux disease  without esophagitis 11/07/2020  . History of abnormal cervical Pap smear 11/07/2020  . History of bipolar disorder 11/07/2020  . Bipolar 2 disorder, major depressive episode ([place]) 06/12/2020  . PTSD (post-traumatic stress disorder) 06/12/2020  . Chronic pain of left knee 10/05/2019  . 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 Neuropathy 02/07/2014  . Hot flashes 02/07/2014  . Weight loss 02/07/2014  . Anxiety    Past Medical History:  Diagnosis Date  . Anxiety   . Arthritis   . Bipolar 1 disorder ([place])   . Depression   . Hepatitis C   . Hypertension   . PTSD (post-traumatic stress disorder)     Family History  Problem Relation Age of Onset  . Hypertension Mother   . Cancer Mother        breast  . Anxiety disorder Mother   . Hypertension Father   . Schizophrenia Father   . Bipolar disorder Father   . Anxiety disorder Father   . Colon cancer Neg Hx   . Colon polyps Neg Hx   . Esophageal cancer Neg Hx   . Rectal cancer Neg Hx   . Stomach cancer Neg Hx     Past Surgical History:  Procedure Laterality Date  . ABDOMINAL HYSTERECTOMY    . CHOLECYSTECTOMY    . DILATION AND CURETTAGE OF UTERUS    . FOOT SURGERY Bilateral    two pins in each foot  . KNEE SURGERY     Social History   Occupational History  . Not on file  Tobacco Use  . Smoking status: Current Some Day Smoker    Packs/day: 0.25    Types: Cigarettes  . Smokeless tobacco: Never Used  Vaping Use  . Vaping Use: Never used  Substance and Sexual Activity  . Alcohol use: Yes    Alcohol/week: 2.0 standard drinks    Types: 1 Glasses of wine, 1 Cans of beer per week  . Drug use: No  . Sexual activity: Not Currently    Birth control/protection: Surgical

## 2021-02-05 ENCOUNTER — Telehealth: Payer: Self-pay | Admitting: Physical Medicine and Rehabilitation

## 2021-02-05 NOTE — Telephone Encounter (Signed)
Pt called stating she missed a call from our office and thinks it could.ve been Dr. Ernestina Patches, so pt would like a CB.   [phone]* Pt will be available from 12-1pm

## 2021-02-05 NOTE — Telephone Encounter (Signed)
Pt called returning a phone call from [facility], she said she will have her phone on her and sorry she missed this call.

## 2021-02-05 NOTE — Telephone Encounter (Signed)
Called and sch and informed her she will be a self pay

## 2021-02-07 ENCOUNTER — Telehealth (HOSPITAL_COMMUNITY): Payer: Self-pay | Admitting: Licensed Clinical Social Worker

## 2021-02-07 ENCOUNTER — Telehealth: Payer: Self-pay | Admitting: Physical Medicine and Rehabilitation

## 2021-02-07 NOTE — Telephone Encounter (Signed)
LCSW called pt to advise of an error from [employer]. This clinician received as request for medical records at address of LCSW's private practice. Pt advised to contact [employer] to have them send request to [facility] and provided address. Pt verbalizes understanding and intent to f/u as directed.

## 2021-02-07 NOTE — Telephone Encounter (Signed)
Pt called wanting to know if Dr. Ernestina Patches had any openings the week of 4/18-4/22 for a spinal neck injections?  [phone]

## 2021-02-10 NOTE — Telephone Encounter (Signed)
Patient called regarding last message she is requesting a appointment for 4/18-4/22 if possible call [phone]

## 2021-02-11 ENCOUNTER — Other Ambulatory Visit: Payer: Self-pay | Admitting: Physician Assistant

## 2021-02-11 ENCOUNTER — Telehealth: Payer: Self-pay | Admitting: Orthopaedic Surgery

## 2021-02-11 MED ORDER — TRAMADOL HCL 50 MG PO TABS: 50.0000 mg | ORAL_TABLET | Freq: Three times a day (TID) | ORAL | 4 refills | Status: DC | PRN

## 2021-02-11 NOTE — Telephone Encounter (Signed)
Patient called. She would like a refill on Tramadol and prednisone.

## 2021-02-11 NOTE — Telephone Encounter (Signed)
Sent in with 4 refills

## 2021-02-11 NOTE — Telephone Encounter (Signed)
Pt was called and r/s

## 2021-02-12 ENCOUNTER — Other Ambulatory Visit: Payer: Self-pay

## 2021-02-12 ENCOUNTER — Ambulatory Visit (INDEPENDENT_AMBULATORY_CARE_PROVIDER_SITE_OTHER): Payer: No Payment, Other | Admitting: Licensed Clinical Social Worker

## 2021-02-12 DIAGNOSIS — F418 Other specified anxiety disorders: Secondary | ICD-10-CM | POA: Diagnosis not present

## 2021-02-12 NOTE — Telephone Encounter (Signed)
Called patient to let her know. No answer LMOM Tramadol filled plus 4 RF's
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
## 2021-02-20 ENCOUNTER — Ambulatory Visit: Payer: Self-pay

## 2021-02-20 ENCOUNTER — Encounter: Payer: Self-pay | Admitting: Physical Medicine and Rehabilitation

## 2021-02-20 ENCOUNTER — Ambulatory Visit (INDEPENDENT_AMBULATORY_CARE_PROVIDER_SITE_OTHER): Payer: PRIVATE HEALTH INSURANCE | Admitting: Physical Medicine and Rehabilitation

## 2021-02-20 ENCOUNTER — Other Ambulatory Visit: Payer: Self-pay

## 2021-02-20 VITALS — BP 152/90 | HR 89

## 2021-02-20 DIAGNOSIS — M5412 Radiculopathy, cervical region: Secondary | ICD-10-CM | POA: Diagnosis not present

## 2021-02-20 MED ORDER — BETAMETHASONE SOD PHOS & ACET 6 (3-3) MG/ML IJ SUSP
12.0000 mg | Freq: Once | INTRAMUSCULAR | Status: AC
  Administered 2021-02-20: 12 mg

## 2021-02-20 NOTE — Progress Notes (Signed)
Theresa Cantrell - 45 y.o. female MRN [id number]  Date of birth: 1975/12/17  Office Visit Note: Visit Date: 02/20/2021 PCP: Ladell Pier, MD Referred by: Ladell Pier, MD  Subjective: Chief Complaint  Patient presents with  . Neck - Pain  . Right Shoulder - Pain  . Left Shoulder - Pain   HPI:  Theresa Cantrell is a 45 y.o. female who comes in today at the request of Dr. Eduard Roux for planned Right C7-T1 Cervical epidural steroid injection with fluoroscopic guidance.  The patient has failed conservative care including home exercise, medications, time and activity modification.  This injection will be diagnostic and hopefully therapeutic.  Please see requesting physician notes for further details and justification. MRI reviewed with images and spine model.  MRI reviewed in the note below.    ROS Otherwise per HPI.  Assessment & Plan: Visit Diagnoses:    ICD-10-CM   1. Cervical radiculopathy  M54.12 XR C-ARM NO REPORT    Epidural Steroid injection    betamethasone acetate-betamethasone sodium phosphate (CELESTONE) injection 12 mg    Plan: No additional findings.   Meds & Orders:  Meds ordered this encounter  Medications  . betamethasone acetate-betamethasone sodium phosphate (CELESTONE) injection 12 mg    Orders Placed This Encounter  Procedures  . XR C-ARM NO REPORT  . Epidural Steroid injection    Follow-up: Return for visit to requesting physician as needed.   Procedures: No procedures performed  Cervical Epidural Steroid Injection - Interlaminar Approach with Fluoroscopic Guidance  Patient: Theresa Cantrell      Date of Birth: January 06, 1976 MRN: [id number] PCP: Ladell Pier, MD      Visit Date: 02/20/2021   Universal Protocol:    Date/Time: 05/16/225:59 AM  Consent Given By: the patient  Position: PRONE  Additional Comments: Vital signs were monitored before and after the procedure. Patient was prepped and draped in the usual sterile  fashion. The correct patient, procedure, and site was verified.   Injection Procedure Details:   Procedure diagnoses: Cervical radiculopathy [M54.12]    Meds Administered:  Meds ordered this encounter  Medications  . betamethasone acetate-betamethasone sodium phosphate (CELESTONE) injection 12 mg     Laterality: Right  Location/Site: C7-T1  Needle: 3.5 in., 20 ga. Tuohy  Needle Placement: Paramedian epidural space  Findings:  -Comments: Excellent flow of contrast into the epidural space.  Procedure Details: Using a paramedian approach from the side mentioned above, the region overlying the inferior lamina was localized under fluoroscopic visualization and the soft tissues overlying this structure were infiltrated with 4 ml. of 1% Lidocaine without Epinephrine. A # 20 gauge, Tuohy needle was inserted into the epidural space using a paramedian approach.  The epidural space was localized using loss of resistance along with contralateral oblique bi-planar fluoroscopic views.  After negative aspirate for air, blood, and CSF, a 2 ml. volume of Isovue-250 was injected into the epidural space and the flow of contrast was observed. Radiographs were obtained for documentation purposes.   The injectate was administered into the level noted above.  Additional Comments:  The patient tolerated the procedure well Dressing: 2 x 2 sterile gauze and Band-Aid    Post-procedure details: Patient was observed during the procedure. Post-procedure instructions were reviewed.  Patient left the clinic in stable condition.     Clinical History: CLINICAL DATA:  Initial evaluation for chronic neck pain with bilateral arm pain, numbness, and tingling.  EXAM: MRI CERVICAL SPINE WITHOUT CONTRAST  TECHNIQUE: Multiplanar, multisequence MR imaging of the cervical spine was performed. No intravenous contrast was administered.  COMPARISON:  Prior radiograph from  01/13/2021  FINDINGS: Alignment: Reversal of the normal cervical lordosis with apex at C5-6. No significant listhesis.  Vertebrae: Vertebral body height well maintained without acute or chronic fracture. Bone marrow signal intensity diffusely heterogeneous without discrete or worrisome osseous lesion. No abnormal marrow edema.  Cord: Normal signal and morphology.  Posterior Fossa, vertebral arteries, paraspinal tissues: Note made of an empty sella. Visualized brain and posterior fossa otherwise unremarkable. Craniocervical junction within normal limits. Paraspinous and prevertebral soft tissues normal. Normal flow voids seen within the vertebral arteries bilaterally.  Disc levels:  C2-C3: Mild disc bulge with uncovertebral hypertrophy. Posterior disc osteophyte minimally indents the ventral thecal sac without significant spinal stenosis. Foramina remain patent.  C3-C4: Mild disc bulge with uncovertebral hypertrophy. Flattening of the ventral thecal sac with resultant mild spinal stenosis. No cord impingement. Mild to moderate left greater than right C4 foraminal stenosis.  C4-C5: Broad-based left paracentral disc osteophyte complex flattens and indents the ventral thecal sac, asymmetric to the left (series 6, image 18). Flattening of the left ventral cord without cord signal changes. Mild spinal stenosis. Mild left C5 foraminal narrowing. Right neural foramina remains patent.  C5-C6: Right paracentral disc extrusion indents the ventral thecal sac, contacting and flattening the right hemicord (series 5, image 23). No cord signal changes. Mild spinal stenosis. Foramina remain patent.  C6-C7: Degenerative intervertebral disc space narrowing with diffuse disc osteophyte complex. Associated right greater than left uncovertebral hypertrophy. Broad posterior component flattens and partially effaces the ventral thecal sac with resultant mild spinal stenosis. No cord  impingement. Moderate right with mild left C7 foraminal narrowing.  C7-T1: Left-sided uncovertebral hypertrophy without significant disc bulge. No spinal stenosis. Foramina remain patent.  Visualized upper thoracic spine demonstrates no significant finding.  IMPRESSION: 1. Right paracentral disc extrusion at C5-6, contacting and flattening the right hemi cord. The ventral right C6 nerve root could be affected. 2. Left paracentral disc osteophyte complex at C4-5 with resultant mild flattening of the left hemi cord. The ventral left C5 nerve root could be affected. 3. Disc bulging with uncovertebral hypertrophy at C3-4 with resultant mild canal, with mild to moderate left greater than right C4 foraminal stenosis. 4. Disc osteophyte at C6-7 with resultant mild canal with moderate right and mild left C7 foraminal stenosis.   Electronically Signed   By: Jeannine Boga M.D.   On: 02/02/2021 19:24     Objective:  VS:  HT:    WT:   BMI:     BP:(!) 152/90  HR:89bpm  TEMP: ( )  RESP:  Physical Exam Vitals and nursing note reviewed.  Constitutional:      General: She is not in acute distress.    Appearance: Normal appearance. She is not ill-appearing.  HENT:     Head: Normocephalic and atraumatic.     Right Ear: External ear normal.     Left Ear: External ear normal.  Eyes:     Extraocular Movements: Extraocular movements intact.  Cardiovascular:     Rate and Rhythm: Normal rate.     Pulses: Normal pulses.  Musculoskeletal:     Cervical back: Tenderness present. No rigidity.     Right lower leg: No edema.     Left lower leg: No edema.     Comments: Patient has good strength in the upper extremities including 5 out of 5 strength in wrist extension  long finger flexion and APB.  There is no atrophy of the hands intrinsically.  There is a negative Hoffmann's test.   Lymphadenopathy:     Cervical: No cervical adenopathy.  Skin:    Findings: No erythema, lesion or  rash.  Neurological:     General: No focal deficit present.     Mental Status: She is alert and oriented to person, place, and time.     Sensory: No sensory deficit.     Motor: No weakness or abnormal muscle tone.     Coordination: Coordination normal.  Psychiatric:        Mood and Affect: Mood normal.        Behavior: Behavior normal.      Imaging: No results found.

## 2021-02-20 NOTE — Patient Instructions (Signed)

## 2021-02-20 NOTE — Progress Notes (Signed)
Pt state neck pain the travels to both shoulder mostly the right. Pt state reaching, lifting, pushing and pulling makes the pain worse. Pt tatte bending cause her pain. Pt state she takes pain meds and heating pads helps ease her pain.  Numeric Pain Rating Scale and Functional Assessment Average Pain 7   In the last MONTH (on 0-10 scale) has pain interfered with the following?  1. General activity like being  able to carry out your everyday physical activities such as walking, climbing stairs, carrying groceries, or moving a chair?  Rating(10)   +Driver, -BT, -Dye Allergies.

## 2021-02-27 ENCOUNTER — Ambulatory Visit: Payer: PRIVATE HEALTH INSURANCE | Admitting: Physical Medicine and Rehabilitation

## 2021-03-05 ENCOUNTER — Telehealth (HOSPITAL_COMMUNITY): Payer: Self-pay | Admitting: Licensed Clinical Social Worker

## 2021-03-05 ENCOUNTER — Ambulatory Visit (HOSPITAL_COMMUNITY): Payer: Self-pay | Admitting: Licensed Clinical Social Worker

## 2021-03-05 NOTE — Telephone Encounter (Signed)
Patient asking how often she needs to come for therapy. Pt says she was seen in April and does not think she is to be seen every month. Pt has appt scheduled in June.  Please advise.

## 2021-03-11 ENCOUNTER — Encounter: Payer: Self-pay | Admitting: Orthopaedic Surgery

## 2021-03-11 ENCOUNTER — Ambulatory Visit (INDEPENDENT_AMBULATORY_CARE_PROVIDER_SITE_OTHER): Payer: PRIVATE HEALTH INSURANCE | Admitting: Orthopaedic Surgery

## 2021-03-11 DIAGNOSIS — M542 Cervicalgia: Secondary | ICD-10-CM | POA: Diagnosis not present

## 2021-03-11 MED ORDER — PREDNISONE 10 MG (21) PO TBPK: ORAL_TABLET | ORAL | 0 refills | Status: DC

## 2021-03-11 MED ORDER — METHOCARBAMOL 500 MG PO TABS: 500.0000 mg | ORAL_TABLET | Freq: Two times a day (BID) | ORAL | 0 refills | Status: DC | PRN

## 2021-03-11 NOTE — Progress Notes (Signed)
Office Visit Note   Patient: Theresa Cantrell           Date of Birth: 29-Oct-1976           MRN: [id number] Visit Date: 03/11/2021              Requested by: Ladell Pier, MD [address] [place],  [place] [address] PCP: Ladell Pier, MD   Assessment & Plan: Visit Diagnoses:  1. Neck pain     Plan: Impression is continued neck pain and right upper extremity radiculopathy despite undergoing C7-T1 ESI.  At this point, discussed referral to Dr. Lorin Mercy or Louanne Skye for further evaluation and treatment recommendation.  She will follow-up with [place] as needed.  Follow-Up Instructions: Return for with Dr. Lorin Mercy or Louanne Skye.   Orders:  No orders of the defined types were placed in this encounter.  No orders of the defined types were placed in this encounter.     Procedures: No procedures performed   Clinical Data: No additional findings.   Subjective: Chief Complaint  Patient presents with  . Neck - Pain    HPI patient is a pleasant 45 year old female who comes in today with recurrent neck pain and right upper extremity radiculopathy.  Recent MRI showed disc extrusion at C5-6 contacting and flattening the right hemicord, left paracentral disc osteophyte complex C4-5 with mild flattening of the left hemicord, disc bulging with hypertrophy at C3-4 with mild canal mild to moderate left greater than right C4 foraminal stenosis and disc osteophyte C6-7 with mild canal and moderate right and mild left C7 foraminal stenosis.  She was referred to Dr. Ernestina Patches for C7-T1 ESI.  She notes that this significantly helped but only lasted a few days.  No new complaints.  Review of Systems as detailed in HPI.  All others reviewed and are negative.   Objective: Vital Signs: There were no vitals taken for this visit.  Physical Exam well-developed well-nourished female no acute distress.  Alert and oriented x3.  Ortho Exam unchanged cervical spine exam  Specialty Comments:  No specialty  comments available.  Imaging: No new imaging   PMFS History: Patient Active Problem List   Diagnosis Date Noted  . COVID-19 vaccine series not completed 12/20/2020  . Influenza vaccine refused 11/07/2020  . Essential hypertension 11/07/2020  . Obesity (BMI 30.0-34.9) 11/07/2020  . Tobacco dependence 11/07/2020  . Gastroesophageal reflux disease without esophagitis 11/07/2020  . History of abnormal cervical Pap smear 11/07/2020  . History of bipolar disorder 11/07/2020  . Bipolar 2 disorder, major depressive episode ([place]) 06/12/2020  . PTSD (post-traumatic stress disorder) 06/12/2020  . Chronic pain of left knee 10/05/2019  . 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 Neuropathy 02/07/2014  . Hot flashes 02/07/2014  . Weight loss 02/07/2014  . Anxiety    Past Medical History:  Diagnosis Date  . Anxiety   . Arthritis   . Bipolar 1 disorder ([place])   . Depression   . Hepatitis C   . Hypertension   . PTSD (post-traumatic stress disorder)     Family History  Problem Relation Age of Onset  . Hypertension Mother   . Cancer Mother        breast  . Anxiety disorder Mother   . Hypertension Father   . Schizophrenia Father   . Bipolar disorder Father   . Anxiety disorder Father   . Colon cancer Neg Hx   . Colon polyps Neg Hx   .  Esophageal cancer Neg Hx   . Rectal cancer Neg Hx   . Stomach cancer Neg Hx     Past Surgical History:  Procedure Laterality Date  . ABDOMINAL HYSTERECTOMY    . CHOLECYSTECTOMY    . DILATION AND CURETTAGE OF UTERUS    . FOOT SURGERY Bilateral    two pins in each foot  . KNEE SURGERY     Social History   Occupational History  . Not on file  Tobacco Use  . Smoking status: Current Some Day Smoker    Packs/day: 0.25    Types: Cigarettes  . Smokeless tobacco: Never Used  Vaping Use  . Vaping Use: Never used  Substance and Sexual Activity  . Alcohol use: Yes    Alcohol/week: 2.0 standard drinks    Types: 1 Glasses  of wine, 1 Cans of beer per week  . Drug use: No  . Sexual activity: Not Currently    Birth control/protection: Surgical

## 2021-03-17 NOTE — Procedures (Signed)
Cervical Epidural Steroid Injection - Interlaminar Approach with Fluoroscopic Guidance  Patient: Theresa Cantrell      Date of Birth: October 21, 1976 MRN: [id number] PCP: Ladell Pier, MD      Visit Date: 02/20/2021   Universal Protocol:    Date/Time: 05/16/225:59 AM  Consent Given By: the patient  Position: PRONE  Additional Comments: Vital signs were monitored before and after the procedure. Patient was prepped and draped in the usual sterile fashion. The correct patient, procedure, and site was verified.   Injection Procedure Details:   Procedure diagnoses: Cervical radiculopathy [M54.12]    Meds Administered:  Meds ordered this encounter  Medications  . betamethasone acetate-betamethasone sodium phosphate (CELESTONE) injection 12 mg     Laterality: Right  Location/Site: C7-T1  Needle: 3.5 in., 20 ga. Tuohy  Needle Placement: Paramedian epidural space  Findings:  -Comments: Excellent flow of contrast into the epidural space.  Procedure Details: Using a paramedian approach from the side mentioned above, the region overlying the inferior lamina was localized under fluoroscopic visualization and the soft tissues overlying this structure were infiltrated with 4 ml. of 1% Lidocaine without Epinephrine. A # 20 gauge, Tuohy needle was inserted into the epidural space using a paramedian approach.  The epidural space was localized using loss of resistance along with contralateral oblique bi-planar fluoroscopic views.  After negative aspirate for air, blood, and CSF, a 2 ml. volume of Isovue-250 was injected into the epidural space and the flow of contrast was observed. Radiographs were obtained for documentation purposes.   The injectate was administered into the level noted above.  Additional Comments:  The patient tolerated the procedure well Dressing: 2 x 2 sterile gauze and Band-Aid    Post-procedure details: Patient was observed during the  procedure. Post-procedure instructions were reviewed.  Patient left the clinic in stable condition.

## 2021-04-02 ENCOUNTER — Ambulatory Visit (INDEPENDENT_AMBULATORY_CARE_PROVIDER_SITE_OTHER): Payer: PRIVATE HEALTH INSURANCE | Admitting: Orthopaedic Surgery

## 2021-04-02 ENCOUNTER — Encounter: Payer: Self-pay | Admitting: Orthopaedic Surgery

## 2021-04-02 ENCOUNTER — Ambulatory Visit: Payer: PRIVATE HEALTH INSURANCE | Admitting: Orthopaedic Surgery

## 2021-04-02 DIAGNOSIS — M5020 Other cervical disc displacement, unspecified cervical region: Secondary | ICD-10-CM | POA: Insufficient documentation

## 2021-04-02 DIAGNOSIS — M4802 Spinal stenosis, cervical region: Secondary | ICD-10-CM | POA: Insufficient documentation

## 2021-04-02 NOTE — Progress Notes (Addendum)
Office Visit Note   Patient: Theresa Cantrell           Date of Birth: June 03, 1976           MRN: [id number] Visit Date: 04/02/2021              Requested by: Ladell Pier, MD [address] [place],  [place] [address] PCP: Ladell Pier, MD   Assessment & Plan: Visit Diagnoses:  1. Spinal stenosis of cervical region   2. Protrusion of cervical intervertebral disc     Plan: Patient has probably disc protrusion of the hemicord compression left C4-5 and right C5-6 with right radiculopathy.  We discussed mild changes at additional levels.  She is unable to work full-time at this point due to neck pain and arm pain.  Plan is  C4-5, C5-6 two-level cervical fusion with allograft and plate.  Overnight stay with observation.  We discussed potential for problems at other levels in the future particularly C3-4 and C6-7 which are abnormal but  have mild compression at this point.  Risk of postop dysphagia, dysphonia, pseudoarthrosis, possible need for further surgery discussed and reviewed.  Questions were elicited and answered.  Patient understands and requests we proceed.  Follow-Up Instructions: No follow-ups on file.   Orders:  No orders of the defined types were placed in this encounter.  No orders of the defined types were placed in this encounter.     Procedures: No procedures performed   Clinical Data: No additional findings.   Subjective: Chief Complaint  Patient presents with   Neck - Pain    HPI 45 year old female referred to me by Dr.Xu with cervical stenosis and cord flattening and radiculopathy persistent for several months.  She has had pain for more than a year symptoms have progressed for the last 3 months with increased pain and weakness more in her right arm than left arm.  Pain radiates to the right C6 distribution.  She has been on tramadol, methocarbamol, used heat, ice, traction, with prednisone Dosepaks and cervical epidural steroid injections in  April without sustained relief.  Symptoms have progressed the point where she not able to work full day she was cut back to half days and states now she has been laid off.  She has sharp pain with neck range of motion difficulty sleeping.  Cervical MRI scan shows cervical stenosis with hemicord compression on the right at C5-6 and on the left at C4-5.  She has additional mild stenosis at C3-4 and C6-7 without compression.  She is not had any falling.  No myelopathic symptoms.  Patient does have hypertension, anxiety bipolar disorder.  Her hypertension is well controlled medications. Review of Systems all other systems noncontributory to HPI.  No fever chills negative myelopathic symptoms positive for neck and radicular right arm pain.   Objective: Vital Signs: BP 127/76   Pulse 84   Ht 5\' 5"  (1.651 m)   Wt 185 lb (83.9 kg)   BMI 30.79 kg/m   Physical Exam Constitutional:      Appearance: She is well-developed.  HENT:     Head: Normocephalic.     Right Ear: External ear normal.     Left Ear: External ear normal.  Eyes:     Pupils: Pupils are equal, round, and reactive to light.  Neck:     Thyroid: No thyromegaly.     Trachea: No tracheal deviation.  Cardiovascular:     Rate and Rhythm: Normal rate.  Pulmonary:     Effort: Pulmonary effort is normal.  Abdominal:     Palpations: Abdomen is soft.  Skin:    General: Skin is warm and dry.  Neurological:     Mental Status: She is alert and oriented to person, place, and time.  Psychiatric:        Behavior: Behavior normal.     Ortho Exam patient has positive brachial plexus tenderness on the right more than left.  Positive Lhermitte.  Positive Spurling on the right more than left.  Upper extremity reflexes are 2+ and symmetrical decreased sensation radial 3 fingers.  Negative carpal compression test negative Phalen's right and left.  No biceps triceps atrophy.  No lower extremity clonus knee and ankle jerk are 2+ and  symmetrical.  Specialty Comments:  No specialty comments available.  Imaging: CLINICAL DATA:  Initial evaluation for chronic neck pain with bilateral arm pain, numbness, and tingling.   EXAM: MRI CERVICAL SPINE WITHOUT CONTRAST   TECHNIQUE: Multiplanar, multisequence MR imaging of the cervical spine was performed. No intravenous contrast was administered.   COMPARISON:  Prior radiograph from 01/13/2021   FINDINGS: Alignment: Reversal of the normal cervical lordosis with apex at C5-6. No significant listhesis.   Vertebrae: Vertebral body height well maintained without acute or chronic fracture. Bone marrow signal intensity diffusely heterogeneous without discrete or worrisome osseous lesion. No abnormal marrow edema.   Cord: Normal signal and morphology.   Posterior Fossa, vertebral arteries, paraspinal tissues: Note made of an empty sella. Visualized brain and posterior fossa otherwise unremarkable. Craniocervical junction within normal limits. Paraspinous and prevertebral soft tissues normal. Normal flow voids seen within the vertebral arteries bilaterally.   Disc levels:   C2-C3: Mild disc bulge with uncovertebral hypertrophy. Posterior disc osteophyte minimally indents the ventral thecal sac without significant spinal stenosis. Foramina remain patent.   C3-C4: Mild disc bulge with uncovertebral hypertrophy. Flattening of the ventral thecal sac with resultant mild spinal stenosis. No cord impingement. Mild to moderate left greater than right C4 foraminal stenosis.   C4-C5: Broad-based left paracentral disc osteophyte complex flattens and indents the ventral thecal sac, asymmetric to the left (series 6, image 18). Flattening of the left ventral cord without cord signal changes. Mild spinal stenosis. Mild left C5 foraminal narrowing. Right neural foramina remains patent.   C5-C6: Right paracentral disc extrusion indents the ventral thecal sac, contacting and  flattening the right hemicord (series 5, image 23). No cord signal changes. Mild spinal stenosis. Foramina remain patent.   C6-C7: Degenerative intervertebral disc space narrowing with diffuse disc osteophyte complex. Associated right greater than left uncovertebral hypertrophy. Broad posterior component flattens and partially effaces the ventral thecal sac with resultant mild spinal stenosis. No cord impingement. Moderate right with mild left C7 foraminal narrowing.   C7-T1: Left-sided uncovertebral hypertrophy without significant disc bulge. No spinal stenosis. Foramina remain patent.   Visualized upper thoracic spine demonstrates no significant finding.   IMPRESSION: 1. Right paracentral disc extrusion at C5-6, contacting and flattening the right hemi cord. The ventral right C6 nerve root could be affected. 2. Left paracentral disc osteophyte complex at C4-5 with resultant mild flattening of the left hemi cord. The ventral left C5 nerve root could be affected. 3. Disc bulging with uncovertebral hypertrophy at C3-4 with resultant mild canal, with mild to moderate left greater than right C4 foraminal stenosis. 4. Disc osteophyte at C6-7 with resultant mild canal with moderate right and mild left C7 foraminal stenosis.     Electronically  Signed   By: Jeannine Boga M.D.   On: 02/02/2021 19:24   PMFS History: Patient Active Problem List   Diagnosis Date Noted   Spinal stenosis of cervical region 04/02/2021   Protrusion of cervical intervertebral disc 04/02/2021   COVID-19 vaccine series not completed 12/20/2020   Influenza vaccine refused 11/07/2020   Essential hypertension 11/07/2020   Obesity (BMI 30.0-34.9) 11/07/2020   Tobacco dependence 11/07/2020   Gastroesophageal reflux disease without esophagitis 11/07/2020   History of abnormal cervical Pap smear 11/07/2020   History of bipolar disorder 11/07/2020   Bipolar 2 disorder, major depressive episode ([place])  06/12/2020   PTSD (post-traumatic stress disorder) 06/12/2020   Chronic pain of left knee 10/05/2019   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   Neuropathy 02/07/2014   Hot flashes 02/07/2014   Weight loss 02/07/2014   Anxiety    Past Medical History:  Diagnosis Date   Anxiety    Arthritis    Bipolar 1 disorder (HCC)    Depression    Hepatitis C    Hypertension    PTSD (post-traumatic stress disorder)     Family History  Problem Relation Age of Onset   Hypertension Mother    Cancer Mother        breast   Anxiety disorder Mother    Hypertension Father    Schizophrenia Father    Bipolar disorder Father    Anxiety disorder Father    Colon cancer Neg Hx    Colon polyps Neg Hx    Esophageal cancer Neg Hx    Rectal cancer Neg Hx    Stomach cancer Neg Hx     Past Surgical History:  Procedure Laterality Date   ABDOMINAL HYSTERECTOMY     CHOLECYSTECTOMY     DILATION AND CURETTAGE OF UTERUS     FOOT SURGERY Bilateral    two pins in each foot   KNEE SURGERY     Social History   Occupational History   Not on file  Tobacco Use   Smoking status: Current Some Day Smoker    Packs/day: 0.25    Types: Cigarettes   Smokeless tobacco: Never Used  Vaping Use   Vaping Use: Never used  Substance and Sexual Activity   Alcohol use: Yes    Alcohol/week: 2.0 standard drinks    Types: 1 Glasses of wine, 1 Cans of beer per week   Drug use: No   Sexual activity: Not Currently    Birth control/protection: Surgical

## 2021-04-03 ENCOUNTER — Telehealth (INDEPENDENT_AMBULATORY_CARE_PROVIDER_SITE_OTHER): Payer: No Payment, Other | Admitting: Psychiatry

## 2021-04-03 ENCOUNTER — Encounter (HOSPITAL_COMMUNITY): Payer: Self-pay | Admitting: Psychiatry

## 2021-04-03 ENCOUNTER — Other Ambulatory Visit: Payer: Self-pay

## 2021-04-03 DIAGNOSIS — F419 Anxiety disorder, unspecified: Secondary | ICD-10-CM | POA: Diagnosis not present

## 2021-04-03 DIAGNOSIS — F4310 Post-traumatic stress disorder, unspecified: Secondary | ICD-10-CM

## 2021-04-03 DIAGNOSIS — F3181 Bipolar II disorder: Secondary | ICD-10-CM

## 2021-04-03 MED ORDER — HYDROXYZINE HCL 10 MG PO TABS: 10.0000 mg | ORAL_TABLET | Freq: Three times a day (TID) | ORAL | 2 refills | Status: DC | PRN

## 2021-04-03 MED ORDER — PRAZOSIN HCL 1 MG PO CAPS: 1.0000 mg | ORAL_CAPSULE | Freq: Every day | ORAL | 2 refills | Status: DC

## 2021-04-03 MED ORDER — FLUOXETINE HCL 20 MG PO CAPS: 20.0000 mg | ORAL_CAPSULE | Freq: Every day | ORAL | 2 refills | Status: DC

## 2021-04-03 MED ORDER — TRAZODONE HCL 50 MG PO TABS: 50.0000 mg | ORAL_TABLET | Freq: Every evening | ORAL | 2 refills | Status: DC | PRN

## 2021-04-03 MED ORDER — ARIPIPRAZOLE (SENSOR) 10 MG PO TABS: 10.0000 mg | ORAL_TABLET | Freq: Every day | ORAL | 2 refills | Status: DC

## 2021-04-03 NOTE — Progress Notes (Signed)
BH MD/PA/NP OP Progress Note Virtual Visit via Telephone Note  I connected with Theresa Cantrell on 04/03/21 at  4:00 PM EDT by telephone and verified that I am speaking with the correct person using two identifiers.  Location: Patient: home Provider: Clinic   I discussed the limitations, risks, security and privacy concerns of performing an evaluation and management service by telephone and the availability of in person appointments. I also discussed with the patient that there may be a patient responsible charge related to this service. The patient expressed understanding and agreed to proceed.   I provided 30 minutes of non-face-to-face time during this encounter.       04/03/2021 9:45 AM Theresa Cantrell  MRN:  [id number]  Chief Complaint: " The voices have deminished"  HPI: 45 year old female seen today for follow up psychiatric evaluation.  She has a psychiatric history of  Bipolar, depression, anxiety, and PTSD.  She is currently being managed on Abilify 10 mg daily, prazosin 1 mg nightly, trazodone 25 mg to 50 mg as needed sleep, hydroxyzine 10 mg three times daily, and Prozac 20 mg daily.  Patient notes that her medications are some effective in managing her psychiatric conditions.  Today she was unable to login virtually so her exam was done over the on the phone.Today she was pleasant, cooperative, and engaged in conversation.  She informed provider that overall things are well. She notes since increasing Abilify her depression has improved, AH has diminished, and VH has not reoccurred. Today provider conducted a GAD 7 and patient scored a 6. Provider also conducted a PHQ 9 and patient scored a 4. She denies SI/HI/VH, mania, or paranoia. She notes her mood fluctuated but notes that over all she feels stable. She endorses adequte sleep (7-10 hours) and appetite.   Patient informed provider that her PCP wrote her out of work due to her chronic spine, arm, and neck pain. She notes  that she lost her job at [employer] for being on medical leave. 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   No medication changes made today. Patient agreeable to continue all medications as prescribed. No other concerns noted at this time.    Visit Diagnosis:    ICD-10-CM   1. Bipolar 2 disorder, major depressive episode (HCC)  F31.81 ARIPiprazole, sensor, 10 MG TABS    FLUoxetine (PROZAC) 20 MG capsule    traZODone (DESYREL) 50 MG tablet  2. Anxiety  F41.9 hydrOXYzine (ATARAX/VISTARIL) 10 MG tablet  3. PTSD (post-traumatic stress disorder)  F43.10 prazosin (MINIPRESS) 1 MG capsule    Past Psychiatric History:  Bipolar, depression, anxiety, and PTSD.  Past Medical History:  Past Medical History:  Diagnosis Date  . Anxiety   . Arthritis   . Bipolar 1 disorder ([place])   . Depression   . Hepatitis C   . Hypertension   . PTSD (post-traumatic stress disorder)     Past Surgical History:  Procedure Laterality Date  . ABDOMINAL HYSTERECTOMY    . CHOLECYSTECTOMY    . DILATION AND CURETTAGE OF UTERUS    . FOOT SURGERY Bilateral    two pins in each foot  . KNEE SURGERY      Family Psychiatric History: Mother Bipolar and depression, father schizophrenia and depression, bipolar.  Family History:  Family History  Problem Relation Age  of Onset  . Hypertension Mother   . Cancer Mother        breast  . Anxiety disorder Mother   . Hypertension Father   . Schizophrenia Father   . Bipolar disorder Father   . Anxiety disorder Father   . Colon cancer Neg Hx   . Colon polyps Neg Hx   . Esophageal cancer Neg Hx   . Rectal cancer Neg Hx   . Stomach cancer Neg Hx     Social History:  Social History   Socioeconomic History  . Marital status: Single    Spouse name: Not on file  . Number of children: 1  . Years of education: Not on file  .  Highest education level: Some college, no degree  Occupational History  . Not on file  Tobacco Use  . Smoking status: Current Some Day Smoker    Packs/day: 0.25    Types: Cigarettes  . Smokeless tobacco: Never Used  Vaping Use  . Vaping Use: Never used  Substance and Sexual Activity  . Alcohol use: Yes    Alcohol/week: 2.0 standard drinks    Types: 1 Glasses of wine, 1 Cans of beer per week  . Drug use: No  . Sexual activity: Not Currently    Birth control/protection: Surgical  Other Topics Concern  . Not on file  Social History Narrative  . Not on file   Social Determinants of Health   Financial Resource Strain: Not on file  Food Insecurity: Not on file  Transportation Needs: Not on file  Physical Activity: Not on file  Stress: Not on file  Social Connections: Not on file    Allergies:  Allergies  Allergen Reactions  . Lamotrigine Hives    Metabolic Disorder Labs: Lab Results  Component Value Date   HGBA1C 5.7 (H) 11/07/2020   No results found for: PROLACTIN Lab Results  Component Value Date   CHOL 165 11/07/2020   TRIG 84 11/07/2020   HDL 63 11/07/2020   CHOLHDL 2.6 11/07/2020   LDLCALC 86 11/07/2020   LDLCALC 63 03/31/2018   Lab Results  Component Value Date   TSH 0.484 03/31/2018   TSH 1.501 02/07/2014    Therapeutic Level Labs: No results found for: LITHIUM No results found for: VALPROATE No components found for:  CBMZ  Current Medications: Current Outpatient Medications  Medication Sig Dispense Refill  . amLODipine (NORVASC) 5 MG tablet Take 1 tablet (5 mg total) by mouth daily. 90 tablet 3  . ARIPiprazole, sensor, 10 MG TABS Take 10 mg by mouth daily. 30 tablet 2  . diclofenac (VOLTAREN) 75 MG EC tablet Take 1 tablet (75 mg total) by mouth 2 (two) times daily as needed. 60 tablet 2  . diclofenac Sodium (VOLTAREN) 1 % GEL Apply 2 g topically 4 (four) times daily. 150 g 3  . famotidine (PEPCID) 20 MG tablet Take 1 tablet (20 mg total) by  mouth 2 (two) times daily. To reduce stomach acid 60 tablet 2  . FLUoxetine (PROZAC) 20 MG capsule Take 1 capsule (20 mg total) by mouth daily. 30 capsule 2  . HYDROcodone-acetaminophen (NORCO) 5-325 MG tablet Take 1 tablet by mouth 2 (two) times daily as needed. 10 tablet 0  . hydrOXYzine (ATARAX/VISTARIL) 10 MG tablet Take 1 tablet (10 mg total) by mouth 3 (three) times daily as needed. 90 tablet 2  . meloxicam (MOBIC) 15 MG tablet Take 15 mg by mouth daily as needed.    . methocarbamol (ROBAXIN)  500 MG tablet Take 1 tablet (500 mg total) by mouth 2 (two) times daily as needed. 20 tablet 0  . metroNIDAZOLE (FLAGYL) 500 MG tablet Take 1 tablet (500 mg total) by mouth 2 (two) times daily. 14 tablet 0  . nicotine (NICODERM CQ - DOSED IN MG/24 HOURS) 14 mg/24hr patch Place 1 patch (14 mg total) onto the skin daily. 28 patch 1  . prazosin (MINIPRESS) 1 MG capsule Take 1 capsule (1 mg total) by mouth at bedtime. 30 capsule 2  . predniSONE (STERAPRED UNI-PAK 21 TAB) 10 MG (21) TBPK tablet Take as directed (Patient not taking: Reported on 04/02/2021) 21 tablet 0  . traMADol (ULTRAM) 50 MG tablet Take 1 tablet (50 mg total) by mouth 3 (three) times daily as needed. 30 tablet 4  . traZODone (DESYREL) 50 MG tablet Take 1 tablet (50 mg total) by mouth at bedtime as needed for sleep. 30 tablet 2  . valACYclovir (VALTREX) 1000 MG tablet Take 1 tablet by mouth daily.     No current facility-administered medications for this visit.     Musculoskeletal: Strength & Muscle Tone: Unable to assess due to telhealth visit [place]: Unable to assess due to telephone visit Patient leans: N/A  Psychiatric Specialty Exam: Review of Systems  There were no vitals taken for this visit.There is no height or weight on file to calculate BMI.  General Appearance: Unable to assess due to telephone visit  Eye Contact:  Unable to assess due to telephone visit  Speech:  Clear and Coherent and Normal Rate  Volume:   Normal  Mood:  Euthymic  Affect:  Congruent  Thought Process:  Coherent, Goal Directed and Linear  Orientation:  Full (Time, Place, and Person)  Thought Content: WDL, Logical and Hallucinations: Auditory Notes VH has not reoccured and AH has dimished   Suicidal Thoughts:  No  Homicidal Thoughts:  No  Memory:  Immediate;   Good Recent;   Good Remote;   Good  Judgement:  Good  Insight:  Good  Psychomotor Activity:  Normal  Concentration:  Concentration: Good and Attention Span: Good  Recall:  Good  Fund of Knowledge: Good  Language: Good  Akathisia:  No  Handed:  Right  AIMS (if indicated): Not done  Assets:  Communication Skills Desire for Improvement Financial Resources/Insurance Housing Social Support  ADL's:  Intact  Cognition: WNL  Sleep:  Good   Screenings: GAD-7   Flowsheet Row Video Visit from 04/03/2021 in [place] from 01/08/2021 in [facility] Office Visit from 12/20/2020 in [place] Office Visit from 11/07/2020 in [place] from 10/10/2020 in [facility]  Total GAD-7 Score 6 8 4 13 12     PHQ2-9   Flowsheet Row Video Visit from 04/03/2021 in [place] from 01/08/2021 in [facility] Office Visit from 12/20/2020 in [place] Office Visit from 11/07/2020 in [place] from 10/10/2020 in [facility]  PHQ-2 Total Score 2 3 3 4 5   PHQ-9 Total Score 4 7 5 8 10     Flowsheet Row Clinical Support from 01/08/2021 in [place] and Plan: Patient reports that she is doing well on her  current medication regimen. She informed Probation officer that her AH has  diminished and her VH has not reoccurred. No medication changes made today. Patient agreeable to continue all medications as prescribed.    1. Bipolar 2 disorder, major depressive episode (HCC)  Continue- ARIPiprazole 10 MG TABS; Take 10 mg by mouth daily.  Dispense: 30 tablet; Refill: 2 Continue- FLUoxetine (PROZAC) 20 MG capsule; Take 1 capsule (20 mg total) by mouth daily.  Dispense: 30 capsule; Refill: 2 Continue- traZODone (DESYREL) 50 MG tablet; Take 1 tablet (50 mg total) by mouth at bedtime as needed for sleep.  Dispense: 30 tablet; Refill: 2  2. Anxiety  Continue- hydrOXYzine (ATARAX/VISTARIL) 10 MG tablet; Take 1 tablet (10 mg total) by mouth 3 (three) times daily as needed.  Dispense: 90 tablet; Refill: 2  3. PTSD (post-traumatic stress disorder)  Continue- prazosin (MINIPRESS) 1 MG capsule; Take 1 capsule (1 mg total) by mouth at bedtime.  Dispense: 30 capsule; Refill: 2   Follow-up in 3 months Follow-up with therapy  Salley Slaughter, NP 04/03/2021, 9:45 AM

## 2021-04-04 ENCOUNTER — Other Ambulatory Visit (HOSPITAL_COMMUNITY): Payer: Self-pay | Admitting: Psychiatry

## 2021-04-04 ENCOUNTER — Telehealth (HOSPITAL_COMMUNITY): Payer: Self-pay

## 2021-04-04 MED ORDER — ARIPIPRAZOLE 10 MG PO TABS: 10.0000 mg | ORAL_TABLET | Freq: Every day | ORAL | 2 refills | Status: DC

## 2021-04-04 NOTE — Telephone Encounter (Signed)
Writer spoke with patient's pharmacy, [facility] on [address], regarding pt's Aripiprazole 10mg . It was sent in as sensor tablets but pharmacist stated that they don't carry the sensor tabs so can it be sent in as regular Aripiprazole 10mg  tablets instead? Please review and advise. Thank you

## 2021-04-04 NOTE — Telephone Encounter (Signed)
Medication reordered and sent to preferred pharmacy.

## 2021-04-09 NOTE — Telephone Encounter (Signed)
Notified patient.

## 2021-04-21 ENCOUNTER — Ambulatory Visit: Payer: PRIVATE HEALTH INSURANCE | Attending: Internal Medicine | Admitting: Internal Medicine

## 2021-04-21 ENCOUNTER — Encounter: Payer: Self-pay | Admitting: Internal Medicine

## 2021-04-21 ENCOUNTER — Other Ambulatory Visit: Payer: Self-pay

## 2021-04-21 DIAGNOSIS — R87629 Unspecified abnormal cytological findings in specimens from vagina: Secondary | ICD-10-CM

## 2021-04-21 DIAGNOSIS — I10 Essential (primary) hypertension: Secondary | ICD-10-CM

## 2021-04-21 DIAGNOSIS — F17200 Nicotine dependence, unspecified, uncomplicated: Secondary | ICD-10-CM | POA: Diagnosis not present

## 2021-04-21 MED ORDER — AMLODIPINE BESYLATE 10 MG PO TABS: 10.0000 mg | ORAL_TABLET | Freq: Every day | ORAL | 1 refills | Status: DC

## 2021-04-21 NOTE — Progress Notes (Signed)
Virtual Visit via Telephone Note  I connected with Theresa Cantrell on 04/21/2021 at 4:09 PM by telephone and verified that I am speaking with the correct person using two identifiers  Location: Patient: home Provider: office  Participants: Myself Patient   I discussed the limitations, risks, security and privacy concerns of performing an evaluation and management service by telephone and the availability of in person appointments. I also discussed with the patient that there may be a patient responsible charge related to this service. The patient expressed understanding and agreed to proceed.   History of Present Illness: Patient with history of HTN, tob dep, GERD, obesity,  PTSD, Bipolar 2, tob dep, CTS RT hand, family history of breast cancer in her mother.  Last seen 12/2020.  This appt is for chronic ds management.   Abn PAP:  Patient had a hysterectomy at the age of 75 due to uterine prolapse.  However she saw her gynecologist in 2019 and had a Pap smear done that  was abnormal with ASCUS and HPV positive.  Colpo which came back okay.  Plan was for repeat Pap smear in 1 year but she did not follow-up at that time due to lack of insurance.  I saw her in February of this year and did the Pap smear.  It came back showing ASCUS with a negative HPV.  I referred her back to gynecology but received message that it has to go through the [facility] program.  Patient now has insurance.  HTN:  compliant with Norvasc Checks BP a few times a wk especially if she gets dizzy spell or HA.  Blood pressure today was 138/91 with pulse of 74. Limits salt in foods  Tob dep:  did get patches but they fall off her skin.  Works in a [employer], dispensing.  Smoking 4 cigarettes a day.  Reports she is still committed to quitting.   Outpatient Encounter Medications as of 04/21/2021  Medication Sig   amLODipine (NORVASC) 5 MG tablet Take 1 tablet (5 mg total) by mouth daily.   ARIPiprazole (ABILIFY) 10 MG tablet Take 1 tablet  (10 mg total) by mouth daily.   diclofenac (VOLTAREN) 75 MG EC tablet Take 1 tablet (75 mg total) by mouth 2 (two) times daily as needed.   diclofenac Sodium (VOLTAREN) 1 % GEL Apply 2 g topically 4 (four) times daily.   famotidine (PEPCID) 20 MG tablet Take 1 tablet (20 mg total) by mouth 2 (two) times daily. To reduce stomach acid   FLUoxetine (PROZAC) 20 MG capsule Take 1 capsule (20 mg total) by mouth daily.   HYDROcodone-acetaminophen (NORCO) 5-325 MG tablet Take 1 tablet by mouth 2 (two) times daily as needed.   hydrOXYzine (ATARAX/VISTARIL) 10 MG tablet Take 1 tablet (10 mg total) by mouth 3 (three) times daily as needed.   meloxicam (MOBIC) 15 MG tablet Take 15 mg by mouth daily as needed.   methocarbamol (ROBAXIN) 500 MG tablet Take 1 tablet (500 mg total) by mouth 2 (two) times daily as needed.   metroNIDAZOLE (FLAGYL) 500 MG tablet Take 1 tablet (500 mg total) by mouth 2 (two) times daily.   nicotine (NICODERM CQ - DOSED IN MG/24 HOURS) 14 mg/24hr patch Place 1 patch (14 mg total) onto the skin daily.   prazosin (MINIPRESS) 1 MG capsule Take 1 capsule (1 mg total) by mouth at bedtime.   predniSONE (STERAPRED UNI-PAK 21 TAB) 10 MG (21) TBPK tablet Take as directed (Patient not taking: Reported on  04/02/2021)   traMADol (ULTRAM) 50 MG tablet Take 1 tablet (50 mg total) by mouth 3 (three) times daily as needed.   traZODone (DESYREL) 50 MG tablet Take 1 tablet (50 mg total) by mouth at bedtime as needed for sleep.   valACYclovir (VALTREX) 1000 MG tablet Take 1 tablet by mouth daily.   No facility-administered encounter medications on file as of 04/21/2021.      Observations/Objective: No direct observation done as this was a telephone encounter.  Assessment and Plan: 1. Essential hypertension Not at goal.  Increase amlodipine to 10 mg daily.  Advised patient to continue checking blood pressure with goal being 130/80 or lower. - amLODipine (NORVASC) 10 MG tablet; Take 1 tablet (10 mg  total) by mouth daily.  Dispense: 90 tablet; Refill: 1  2. Abnormal Pap smear of vagina We will get her back in with Dr. Jeannetta Nap to see if anything further needs to be done based on the results of her last Pap smear. - Ambulatory referral to Gynecology  3. Tobacco dependence Strongly advised to quit.  Recommend using surgical tape around the edges of the nicotine patches to get them to stay on the skin.  She will give this a try.   Follow Up Instructions: 4 mths   I discussed the assessment and treatment plan with the patient. The patient was provided an opportunity to ask questions and all were answered. The patient agreed with the plan and demonstrated an understanding of the instructions.   The patient was advised to call back or seek an in-person evaluation if the symptoms worsen or if the condition fails to improve as anticipated.  I  Spent 11 minutes on this telephone encounter  Karle Plumber, MD

## 2021-04-23 ENCOUNTER — Telehealth: Payer: Self-pay | Admitting: Internal Medicine

## 2021-04-23 ENCOUNTER — Other Ambulatory Visit: Payer: Self-pay

## 2021-04-23 ENCOUNTER — Ambulatory Visit (HOSPITAL_COMMUNITY): Payer: Self-pay | Admitting: Licensed Clinical Social Worker

## 2021-04-23 ENCOUNTER — Ambulatory Visit (INDEPENDENT_AMBULATORY_CARE_PROVIDER_SITE_OTHER): Payer: No Payment, Other | Admitting: Licensed Clinical Social Worker

## 2021-04-23 DIAGNOSIS — F418 Other specified anxiety disorders: Secondary | ICD-10-CM

## 2021-04-23 DIAGNOSIS — M4802 Spinal stenosis, cervical region: Secondary | ICD-10-CM

## 2021-04-23 NOTE — Telephone Encounter (Signed)
Pt is asking for Pain Management referral for her spine and neck. Pt states "my Doctor knows what I'm talking about." Please advise and thank you

## 2021-04-24 NOTE — Telephone Encounter (Signed)
Will forward to provider  

## 2021-04-29 ENCOUNTER — Telehealth: Payer: Self-pay

## 2021-04-29 NOTE — Telephone Encounter (Signed)
Patient came into the office he is requesting a rx refill for methocarbamol and prednisone call [phone]

## 2021-04-30 ENCOUNTER — Telehealth: Payer: Self-pay | Admitting: Orthopaedic Surgery

## 2021-04-30 ENCOUNTER — Other Ambulatory Visit: Payer: Self-pay | Admitting: Physician Assistant

## 2021-04-30 MED ORDER — METHOCARBAMOL 500 MG PO TABS: 500.0000 mg | ORAL_TABLET | Freq: Two times a day (BID) | ORAL | 0 refills | Status: DC | PRN

## 2021-04-30 MED ORDER — PREDNISONE 10 MG (21) PO TBPK: ORAL_TABLET | ORAL | 0 refills | Status: DC

## 2021-04-30 NOTE — Telephone Encounter (Signed)
Received medical records request form from patient

## 2021-04-30 NOTE — Telephone Encounter (Signed)
Sent in

## 2021-04-30 NOTE — Telephone Encounter (Signed)
Patient aware.

## 2021-05-03 NOTE — Progress Notes (Signed)
   THERAPIST PROGRESS NOTE  Session Time: 45 min  Participation Level: Active  Behavioral Response: CasualAlertAnxious and Depressed  Type of Therapy: Individual Therapy  Treatment Goals addressed: Communication: dep/anx/coping  Interventions: Supportive and Other: grief education/counseling  Summary: Theresa Cantrell is a 45 y.o. female who presents with hx of dep/anx. This date pt returns for in person session. Pt last seen 02/12/21. Had a cancellation and a no show. Addressed scheduling and goals/needs. LCSW assessed for any significant changes. Pt reports she has just started a new job. She got laid off from the job she was on and school is out so did not have that income. Pt got unemployment but it took some time. 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 She provides updates on her physical health, which has declined. Pt reports the epidural she had in her cervical spine lasted about a wk and she is recommended for surgery. She provides details and expresses fear. Pt lost her ins so surgery on hold. Pt reports she is barely sleeping at all d/t pain/anx/dep.She definitely endorses symptoms of dep/anx. Pt taking meds as prescribed. Does not have another med appt until 8/29. LCSW sends med provider an electronic message post session to see if pt can be helped with sleep. Pt seeking referral to pain management clinic via PCP. Remainder of session spent address grief/loss associated with her personal loss of health, her brother's death and pt states her best friend just lost both parents. LCSW provided additional grief education/counseling and gave grief literature for pt to take with her. LCSW introduced letter writing to pt which she will consider. Pt shares she just dealt with brother's birthday. She states she went to grave site and sat and talked to him, fresh flowers. She states she asked for a sign he was okay. Upon leaving she stopped at the store and saw his very best  friend she had not seen since the funeral in 2004. LCSW assisted to process thoughts/feelings. Reviewed poc including scheduling. Pt states appreciation for care.   Diagnosis: Axis I:  depression with anxiety     Hermine Messick, LCSW 05/03/2021

## 2021-05-07 ENCOUNTER — Encounter: Payer: Self-pay | Admitting: Physical Medicine & Rehabilitation

## 2021-05-13 ENCOUNTER — Encounter: Payer: Self-pay | Admitting: Orthopaedic Surgery

## 2021-05-13 ENCOUNTER — Other Ambulatory Visit: Payer: Self-pay

## 2021-05-13 ENCOUNTER — Ambulatory Visit (INDEPENDENT_AMBULATORY_CARE_PROVIDER_SITE_OTHER): Payer: PRIVATE HEALTH INSURANCE | Admitting: Orthopaedic Surgery

## 2021-05-13 DIAGNOSIS — M25562 Pain in left knee: Secondary | ICD-10-CM

## 2021-05-13 DIAGNOSIS — M25561 Pain in right knee: Secondary | ICD-10-CM | POA: Diagnosis not present

## 2021-05-13 DIAGNOSIS — G8929 Other chronic pain: Secondary | ICD-10-CM

## 2021-05-13 MED ORDER — LIDOCAINE HCL 1 % IJ SOLN
2.0000 mL | INTRAMUSCULAR | Status: AC | PRN
Start: 2021-05-13 — End: 2021-05-13
  Administered 2021-05-13: 2 mL

## 2021-05-13 MED ORDER — BUPIVACAINE HCL 0.5 % IJ SOLN
2.0000 mL | INTRAMUSCULAR | Status: AC | PRN
  Administered 2021-05-13: 2 mL via INTRA_ARTICULAR

## 2021-05-13 MED ORDER — METHYLPREDNISOLONE ACETATE 40 MG/ML IJ SUSP
40.0000 mg | INTRAMUSCULAR | Status: AC | PRN
  Administered 2021-05-13: 40 mg via INTRA_ARTICULAR

## 2021-05-13 MED ORDER — BUPIVACAINE HCL 0.5 % IJ SOLN
2.0000 mL | INTRAMUSCULAR | Status: AC | PRN
Start: 2021-05-13 — End: 2021-05-13
  Administered 2021-05-13: 2 mL via INTRA_ARTICULAR

## 2021-05-13 MED ORDER — LIDOCAINE HCL 1 % IJ SOLN
2.0000 mL | INTRAMUSCULAR | Status: AC | PRN
  Administered 2021-05-13: 2 mL

## 2021-05-13 NOTE — Progress Notes (Signed)
Office Visit Note   Patient: Theresa Cantrell           Date of Birth: 05/15/1976           MRN: [id number] Visit Date: 05/13/2021              Requested by: Ladell Pier, MD [address] [place],  [place] [address] PCP: Ladell Pier, MD   Assessment & Plan: Visit Diagnoses:  1. Chronic pain of both knees     Plan: She did have relief from the prior left knee injections which she wanted a Toradol injection to be repeated today.  She is likely having pain from arthritis in the femoral-tibial compartments.  The right knee was injected with cortisone.  She tolerated these injections well.  We will see her back as needed.  Follow-Up Instructions: Return if symptoms worsen or fail to improve.   Orders:  No orders of the defined types were placed in this encounter.  No orders of the defined types were placed in this encounter.     Procedures: Large Joint Inj: bilateral knee on 05/13/2021 7:46 PM Indications: pain Details: 22 G needle  Arthrogram: No  Medications (Right): 2 mL lidocaine 1 %; 2 mL bupivacaine 0.5 %; 40 mg methylPREDNISolone acetate 40 MG/ML Medications (Left): 2 mL lidocaine 1 %; 2 mL bupivacaine 0.5 % Outcome: tolerated well, no immediate complications Patient was prepped and draped in the usual sterile fashion.      Clinical Data: No additional findings.   Subjective: Chief Complaint  Patient presents with   Left Knee - Pain   Right Knee - Pain    [name] returns today for chronic bilateral knee pain.  Requesting injections in both knees.  Prior Toradol injection in the left knee did help.   Review of Systems   Objective: Vital Signs: There were no vitals taken for this visit.  Physical Exam  Ortho Exam Bilateral knee exams are unchanged. Specialty Comments:  No specialty comments available.  Imaging: No results found.   PMFS History: Patient Active Problem List   Diagnosis Date Noted   Spinal stenosis of cervical  region 04/02/2021   Protrusion of cervical intervertebral disc 04/02/2021   COVID-19 vaccine series not completed 12/20/2020   Influenza vaccine refused 11/07/2020   Essential hypertension 11/07/2020   Obesity (BMI 30.0-34.9) 11/07/2020   Tobacco dependence 11/07/2020   Gastroesophageal reflux disease without esophagitis 11/07/2020   History of abnormal cervical Pap smear 11/07/2020   History of bipolar disorder 11/07/2020   Bipolar 2 disorder, major depressive episode ([place]) 06/12/2020   PTSD (post-traumatic stress disorder) 06/12/2020   Chronic pain of left knee 10/05/2019   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   Neuropathy 02/07/2014   Hot flashes 02/07/2014   Weight loss 02/07/2014   Anxiety    Past Medical History:  Diagnosis Date   Anxiety    Arthritis    Bipolar 1 disorder (HCC)    Depression    Hepatitis C    Hypertension    PTSD (post-traumatic stress disorder)     Family History  Problem Relation Age of Onset   Hypertension Mother    Cancer Mother        breast   Anxiety disorder Mother    Hypertension Father    Schizophrenia Father    Bipolar disorder Father    Anxiety disorder Father    Colon cancer Neg Hx    Colon polyps Neg Hx  Esophageal cancer Neg Hx    Rectal cancer Neg Hx    Stomach cancer Neg Hx     Past Surgical History:  Procedure Laterality Date   ABDOMINAL HYSTERECTOMY     CHOLECYSTECTOMY     DILATION AND CURETTAGE OF UTERUS     FOOT SURGERY Bilateral    two pins in each foot   KNEE SURGERY     Social History   Occupational History   Not on file  Tobacco Use   Smoking status: Some Days    Packs/day: 0.25    Pack years: 0.00    Types: Cigarettes   Smokeless tobacco: Never  Vaping Use   Vaping Use: Never used  Substance and Sexual Activity   Alcohol use: Yes    Alcohol/week: 2.0 standard drinks    Types: 1 Glasses of wine, 1 Cans of beer per week   Drug use: No   Sexual activity: Not Currently     Birth control/protection: Surgical

## 2021-05-16 ENCOUNTER — Ambulatory Visit (INDEPENDENT_AMBULATORY_CARE_PROVIDER_SITE_OTHER): Payer: PRIVATE HEALTH INSURANCE | Admitting: Orthopaedic Surgery

## 2021-05-16 ENCOUNTER — Other Ambulatory Visit: Payer: Self-pay

## 2021-05-16 VITALS — BP 136/90 | HR 79 | Ht 65.0 in | Wt 185.0 lb

## 2021-05-16 DIAGNOSIS — M5020 Other cervical disc displacement, unspecified cervical region: Secondary | ICD-10-CM

## 2021-05-16 DIAGNOSIS — M4802 Spinal stenosis, cervical region: Secondary | ICD-10-CM | POA: Diagnosis not present

## 2021-05-16 NOTE — Progress Notes (Signed)
Office Visit Note   Patient: Theresa Cantrell           Date of Birth: 10/14/1976           MRN: [id number] Visit Date: 05/16/2021              Requested by: Ladell Pier, MD [address] [place],  [place] [address] PCP: Ladell Pier, MD   Assessment & Plan: Visit Diagnoses:  1. Protrusion of cervical intervertebral disc   2. Spinal stenosis of cervical region     Plan: We discussed two-level cervical fusion.  Risks of surgery discussed overnight stay, use of a soft cervical collar x6 weeks.  Risk of dysphagia, dysphonia, pseudoarthrosis, possible need for additional surgery.  Questions were elicited and answered.  She will call about scheduling.  We discussed potential for progression of cord compression without surgery to take care of the problem.  Follow-Up Instructions: No follow-ups on file.   Orders:  No orders of the defined types were placed in this encounter.  No orders of the defined types were placed in this encounter.     Procedures: No procedures performed   Clinical Data: No additional findings.   Subjective: Chief Complaint  Patient presents with   Neck - Pain   Right Hand - Pain    HPI 45 year old female seen with right hand pain off and on.  Pain times about a week 1 week no definite injury.  Some numbness and tingling.  Patient sometimes is bothered her right right and sometimes her left.  Previous MRI scan is reviewed from 02/02/2021 once again which shows paracentral disc protrusion flattening the right hemicord and then C4-5 paracentral disc osteophyte complex flattening the left hemicord.  We have discussed two-level cervical fusion with her in the past.  Patient states she just switched insurance and states she is ready to talk with her work to pick a date for surgery.  Review of Systems 14 point system update unchanged from 04/02/2021.   Objective: Vital Signs: BP 136/90   Pulse 79   Ht 5\' 5"  (1.651 m)   Wt 185 lb (83.9 kg)   BMI  30.79 kg/m   Physical Exam Constitutional:      Appearance: She is well-developed.  HENT:     Head: Normocephalic.     Right Ear: External ear normal.     Left Ear: External ear normal. There is no impacted cerumen.  Eyes:     Pupils: Pupils are equal, round, and reactive to light.  Neck:     Thyroid: No thyromegaly.     Trachea: No tracheal deviation.  Cardiovascular:     Rate and Rhythm: Normal rate.  Pulmonary:     Effort: Pulmonary effort is normal.  Abdominal:     Palpations: Abdomen is soft.  Musculoskeletal:     Cervical back: No rigidity.  Skin:    General: Skin is warm and dry.  Neurological:     Mental Status: She is alert and oriented to person, place, and time.  Psychiatric:        Behavior: Behavior normal.    Ortho Exam patient has positive brachial plexus tenderness positive Spurling more on the right than left.  No lower extremity clonus.  Normal heel toe gait.  No upper extremity atrophy.  Lower extremity strength and balance is normal.  Specialty Comments:  No specialty comments available.  Imaging: No results found.   PMFS History: Patient Active Problem List  Diagnosis Date Noted   Spinal stenosis of cervical region 04/02/2021   Protrusion of cervical intervertebral disc 04/02/2021   COVID-19 vaccine series not completed 12/20/2020   Influenza vaccine refused 11/07/2020   Essential hypertension 11/07/2020   Obesity (BMI 30.0-34.9) 11/07/2020   Tobacco dependence 11/07/2020   Gastroesophageal reflux disease without esophagitis 11/07/2020   History of abnormal cervical Pap smear 11/07/2020   History of bipolar disorder 11/07/2020   Bipolar 2 disorder, major depressive episode ([place]) 06/12/2020   PTSD (post-traumatic stress disorder) 06/12/2020   Chronic pain of left knee 10/05/2019   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   Neuropathy 02/07/2014   Hot flashes 02/07/2014   Weight loss 02/07/2014   Anxiety    Past  Medical History:  Diagnosis Date   Anxiety    Arthritis    Bipolar 1 disorder (HCC)    Depression    Hepatitis C    Hypertension    PTSD (post-traumatic stress disorder)     Family History  Problem Relation Age of Onset   Hypertension Mother    Cancer Mother        breast   Anxiety disorder Mother    Hypertension Father    Schizophrenia Father    Bipolar disorder Father    Anxiety disorder Father    Colon cancer Neg Hx    Colon polyps Neg Hx    Esophageal cancer Neg Hx    Rectal cancer Neg Hx    Stomach cancer Neg Hx     Past Surgical History:  Procedure Laterality Date   ABDOMINAL HYSTERECTOMY     CHOLECYSTECTOMY     DILATION AND CURETTAGE OF UTERUS     FOOT SURGERY Bilateral    two pins in each foot   KNEE SURGERY     Social History   Occupational History   Not on file  Tobacco Use   Smoking status: Some Days    Packs/day: 0.25    Types: Cigarettes   Smokeless tobacco: Never  Vaping Use   Vaping Use: Never used  Substance and Sexual Activity   Alcohol use: Yes    Alcohol/week: 2.0 standard drinks    Types: 1 Glasses of wine, 1 Cans of beer per week   Drug use: No   Sexual activity: Not Currently    Birth control/protection: Surgical

## 2021-05-21 ENCOUNTER — Telehealth: Payer: Self-pay | Admitting: Orthopaedic Surgery

## 2021-05-21 NOTE — Telephone Encounter (Signed)
Received $25.00 cash,short term disability paperwork and medical records release form from patient/Forwarding to [employer] today

## 2021-05-22 ENCOUNTER — Other Ambulatory Visit: Payer: Self-pay

## 2021-05-27 ENCOUNTER — Ambulatory Visit: Payer: PRIVATE HEALTH INSURANCE | Admitting: Orthopaedic Surgery

## 2021-05-27 NOTE — Progress Notes (Signed)
Surgical Instructions   Your procedure is scheduled on Friday June 06, 2021.  Report to [facility] Main Entrance "A" at 05:30 A.M., then check in with the Admitting office.  Call [phone] if you have problems or questions between now and the morning of surgery:   Remember: Do not eat after midnight the night before your surgery  You may drink clear liquids until 04:30am the morning of your surgery.   Clear liquids allowed are: Water, Non-Citrus Juices (without pulp), Carbonated Beverages, Clear Tea, Black Coffee Only, and Gatorade   Take these medicines the morning of surgery with A SIP OF WATER  Amlodipine (Norvasc) Aripiprazole (Abilify) Famotidine (Pepcid) Fluoxetine (Prozac) Valacyclovir (Valtrex)  If needed you may take these medications the morning of surgery: Hydrocodone-acetaminophen (Norco) Hydroxyzine (Atarax/Vistaril) Methocarbamol (Robaxin) Tramadol (Ultram)    As of today, STOP taking any Diclofenac (Voltaren), Aspirin (unless otherwise instructed by your surgeon) or Aspirin-containing products; NSAIDS - Aleve, Naproxen, Ibuprofen, Motrin, Advil, Goody's, BC's, all herbal medications, fish oil, and all vitamins.          Do not wear jewelry or makeup Do not wear lotions, powders, perfumes, or deodorant. Do not shave legs and underarms 48 hours prior to surgery.  Men may shave face and neck. Do not wear nail polish, gel polish, artificial nails, or any other type of covering on natural nails including fingernails and toenails. If patients have artificial nails, gel coating, etc. that need to be removed by a nail salon please have this removed prior to surgery or surgery may need to be canceled/delayed if the surgeon/ anesthesia feels like the patient is unable to be adequately monitored. Do not bring valuables to the hospital - [facility] is not responsible for any belongings or valuables.              Do NOT Smoke (Tobacco/Vaping) or drink Alcohol 24 hours  prior to your procedure  If you use a CPAP at night, you may bring all equipment for your overnight stay.   Contacts, glasses, hearing aids, dentures or partials may not be worn into surgery, please bring cases for these belongings   For patients admitted to the hospital, discharge time will be determined by your treatment team.   Patients discharged the day of surgery will not be allowed to drive home, and someone needs to stay with them for 24 hours.  ONLY ONE (1) SUPPORT PERSON MAY WAIT IN THE WAITING AREA WHILE YOU ARE IN SURGERY. NO VISITORS WILL BE ALLOWED IN PRE-OP WHERE PATIENTS GET READY FOR SURGERY.  TWO (2) VISITORS WILL BE ALLOWED IN YOUR ROOM IF YOU ARE ADMITTED AFTER SURGERY.  Minor children may have two parents present. Special consideration for safety and communication needs will be reviewed on a case by case basis.  Special instructions:    Oral Hygiene is also important to reduce your risk of infection.  Remember - BRUSH YOUR TEETH THE MORNING OF SURGERY WITH YOUR REGULAR TOOTHPASTE   [facility]- Preparing For Surgery  Before surgery, you can play an important role. Because skin is not sterile, your skin needs to be as free of germs as possible. You can reduce the number of germs on your skin by washing with CHG (chlorahexidine gluconate) Soap before surgery.  CHG is an antiseptic cleaner which kills germs and bonds with the skin to continue killing germs even after washing.     Please do not use if you have an allergy to CHG or antibacterial soaps.  If your skin becomes reddened/irritated stop using the CHG.  Do not shave (including legs and underarms) for at least 48 hours prior to first CHG shower. It is OK to shave your face.  Please follow these instructions carefully.     Shower the NIGHT BEFORE SURGERY and the MORNING OF SURGERY with CHG Soap.   If you chose to wash your hair, wash your hair first as usual with your normal shampoo. After you shampoo, rinse your  hair and body thoroughly to remove the shampoo.    Then [employer] and genitals (private parts) with your normal soap and rinse thoroughly to remove soap.  Next use the CHG Soap as you would any other liquid soap. You can apply CHG directly to the skin and wash gently with a clean washcloth.   Apply the CHG Soap to your body ONLY FROM THE NECK DOWN.  Do not use on open wounds or open sores. Avoid contact with your eyes, ears, mouth and genitals (private parts). Wash Face and genitals (private parts)  with your normal soap.   Wash thoroughly, paying special attention to the area where your surgery will be performed.  Thoroughly rinse your body with warm water from the neck down.  DO NOT shower/wash with your normal soap after using and rinsing off the CHG Soap.  Pat yourself dry with a CLEAN TOWEL.  Wear CLEAN PAJAMAS to bed the night before surgery  Place CLEAN SHEETS on your bed the night before your surgery  DO NOT SLEEP WITH PETS.   Day of Surgery:  Take a shower with CHG soap. Wear Clean/Comfortable clothing the morning of surgery Do not apply any deodorants/lotions.   Remember to brush your teeth WITH YOUR REGULAR TOOTHPASTE.   Please read over the following fact sheets that you were given.

## 2021-05-28 ENCOUNTER — Encounter (HOSPITAL_COMMUNITY)
Admission: RE | Admit: 2021-05-28 | Discharge: 2021-05-28 | Disposition: A | Discharge status: Home / Self Care | Payer: PRIVATE HEALTH INSURANCE | Source: Ambulatory Visit | Attending: Orthopaedic Surgery | Admitting: Orthopaedic Surgery

## 2021-05-28 ENCOUNTER — Encounter (HOSPITAL_COMMUNITY): Payer: Self-pay | Admitting: Orthopaedic Surgery

## 2021-05-28 ENCOUNTER — Other Ambulatory Visit: Payer: Self-pay

## 2021-05-28 DIAGNOSIS — Z01818 Encounter for other preprocedural examination: Secondary | ICD-10-CM | POA: Insufficient documentation

## 2021-05-28 LAB — COMPREHENSIVE METABOLIC PANEL
ALT: 10 U/L (ref 0–44)
AST: 21 U/L (ref 15–41)
Albumin: 3.9 g/dL (ref 3.5–5.0)
Alkaline Phosphatase: 87 U/L (ref 38–126)
Anion gap: 4 — ABNORMAL LOW (ref 5–15)
BUN: 24 mg/dL — ABNORMAL HIGH (ref 6–20)
CO2: 28 mmol/L (ref 22–32)
Calcium: 9.3 mg/dL (ref 8.9–10.3)
Chloride: 104 mmol/L (ref 98–111)
Creatinine, Ser: 1.03 mg/dL — ABNORMAL HIGH (ref 0.44–1.00)
GFR, Estimated: >60 mL/min (ref >60)
Glucose, Bld: 64 mg/dL — ABNORMAL LOW (ref 70–99)
Potassium: 3.6 mmol/L (ref 3.5–5.1)
Sodium: 136 mmol/L (ref 135–145)
Total Bilirubin: 0.3 mg/dL (ref 0.3–1.2)
Total Protein: 6.6 g/dL (ref 6.5–8.1)

## 2021-05-28 LAB — URINALYSIS, ROUTINE W REFLEX MICROSCOPIC
Bacteria, UA: NONE SEEN
Bilirubin Urine: NEGATIVE
Glucose, UA: NEGATIVE mg/dL
Ketones, ur: NEGATIVE mg/dL
Leukocytes,Ua: NEGATIVE
Nitrite: NEGATIVE
Protein, ur: NEGATIVE mg/dL
Specific Gravity, Urine: 1.017 (ref 1.005–1.030)
pH: 5 (ref 5.0–8.0)

## 2021-05-28 LAB — TYPE AND SCREEN
ABO/RH(D): O POS
Antibody Screen: NEGATIVE

## 2021-05-28 LAB — CBC
HCT: 40.8 % (ref 36.0–46.0)
Hemoglobin: 13.2 g/dL (ref 12.0–15.0)
MCH: 30.1 pg (ref 26.0–34.0)
MCHC: 32.4 g/dL (ref 30.0–36.0)
MCV: 92.9 fL (ref 80.0–100.0)
Platelets: 240 10*3/uL (ref 150–400)
RBC: 4.39 MIL/uL (ref 3.87–5.11)
RDW: 13.3 % (ref 11.5–15.5)
WBC: 6.9 10*3/uL (ref 4.0–10.5)
nRBC: 0 % (ref 0.0–0.2)

## 2021-05-28 LAB — SURGICAL PCR SCREEN
MRSA, PCR: NEGATIVE
Staphylococcus aureus: POSITIVE — AB

## 2021-05-28 NOTE — Progress Notes (Signed)
PCP - Dr. Karle Plumber Cardiologist - Denies  Chest x-ray - Not indicated EKG - 05/28/21 Stress Test - Denies ECHO - Denies Cardiac Cath - Denies  Sleep Study - Yes No OSA  DM - Denies  ERAS Protcol - Yes PRE-SURGERY Ensure given   COVID TEST- Given map and address for covid drive thru requested her to go on 8/2 or 8/3 for test.    Anesthesia review: No   Patient denies shortness of breath, fever, cough and chest pain at PAT appointment   All instructions explained to the patient, with a verbal understanding of the material. Patient agrees to go over the instructions while at home for a better understanding. Patient also instructed to wear a mask while in public after being tested for COVID-19. The opportunity to ask questions was provided.

## 2021-05-28 NOTE — Progress Notes (Signed)
Surgical Instructions     Your procedure is scheduled on Friday June 06, 2021.   Report to [facility] Main Entrance "A" at 05:30 A.M., then check in with the Admitting office.   Call [phone] if you have problems or questions between now and the morning of surgery:              Remember: Do not eat after midnight the night before your surgery   You may drink clear liquids until 04:30am the morning of your surgery.   Clear liquids allowed are: Water, Non-Citrus Juices (without pulp), Carbonated Beverages, Clear Tea, Black Coffee Only, and Gatorade        Enhanced Recovery after Surgery for Orthopedics Enhanced Recovery after Surgery is a protocol used to improve the stress on your body and your recovery after surgery.  Patient Instructions  The day of surgery (if you do NOT have diabetes):  Drink ONE (1) Pre-Surgery Clear Ensure by __4:30___ am the morning of surgery   This drink was given to you during your hospital  pre-op appointment visit. Nothing else to drink after completing the  Pre-Surgery Clear Ensure.         If you have questions, please contact your surgeon's office.         Take these medicines the morning of surgery with A SIP OF WATER  Amlodipine (Norvasc) Aripiprazole (Abilify) Famotidine (Pepcid) Fluoxetine (Prozac) Valacyclovir (Valtrex)   If needed you may take these medications the morning of surgery: Hydrocodone-acetaminophen (Norco) Hydroxyzine (Atarax/Vistaril) Methocarbamol (Robaxin) Tramadol (Ultram)      As of today, STOP taking any Diclofenac (Voltaren), Aspirin (unless otherwise instructed by your surgeon) or Aspirin-containing products; NSAIDS - Aleve, Naproxen, Ibuprofen, Motrin, Advil, Goody's, BC's, all herbal medications, fish oil, and all vitamins.          Do not wear jewelry or makeup Do not wear lotions, powders, perfumes, or deodorant. Do not shave legs and underarms 48 hours prior to surgery.  Men may shave face and neck. Do  not wear nail polish, gel polish, artificial nails, or any other type of covering on natural nails including fingernails and toenails. If patients have artificial nails, gel coating, etc. that need to be removed by a nail salon please have this removed prior to surgery or surgery may need to be canceled/delayed if the surgeon/ anesthesia feels like the patient is unable to be adequately monitored. Do not bring valuables to the hospital - [facility] is not responsible for any belongings or valuables.               Do NOT Smoke (Tobacco/Vaping) or drink Alcohol 24 hours prior to your procedure   If you use a CPAP at night, you may bring all equipment for your overnight stay.   Contacts, glasses, hearing aids, dentures or partials may not be worn into surgery, please bring cases for these belongings   For patients admitted to the hospital, discharge time will be determined by your treatment team.   Patients discharged the day of surgery will not be allowed to drive home, and someone needs to stay with them for 24 hours.   ONLY ONE (1) SUPPORT PERSON MAY WAIT IN THE WAITING AREA WHILE YOU ARE IN SURGERY. NO VISITORS WILL BE ALLOWED IN PRE-OP WHERE PATIENTS GET READY FOR SURGERY.  TWO (2) VISITORS WILL BE ALLOWED IN YOUR ROOM IF YOU ARE ADMITTED AFTER SURGERY.  Minor children may have two parents present. Special consideration for safety and  communication needs will be reviewed on a case by case basis.   Special instructions:     Oral Hygiene is also important to reduce your risk of infection.  Remember - BRUSH YOUR TEETH THE MORNING OF SURGERY WITH YOUR REGULAR TOOTHPASTE     [place]- Preparing For Surgery   Before surgery, you can play an important role. Because skin is not sterile, your skin needs to be as free of germs as possible. You can reduce the number of germs on your skin by washing with CHG (chlorahexidine gluconate) Soap before surgery.  CHG is an antiseptic cleaner which kills  germs and bonds with the skin to continue killing germs even after washing.       Please do not use if you have an allergy to CHG or antibacterial soaps. If your skin becomes reddened/irritated stop using the CHG. Do not shave (including legs and underarms) for at least 48 hours prior to first CHG shower. It is OK to shave your face.   Please follow these instructions carefully.                                                                                                                                  Shower the NIGHT BEFORE SURGERY and the MORNING OF SURGERY with CHG Soap.   If you chose to wash your hair, wash your hair first as usual with your normal shampoo. After you shampoo, rinse your hair and body thoroughly to remove the shampoo.     Then [employer] and genitals (private parts) with your normal soap and rinse thoroughly to remove soap.   Next use the CHG Soap as you would any other liquid soap. You can apply CHG directly to the skin and wash gently with a clean washcloth.    Apply the CHG Soap to your body ONLY FROM THE NECK DOWN.  Do not use on open wounds or open sores. Avoid contact with your eyes, ears, mouth and genitals (private parts). Wash Face and genitals (private parts)  with your normal soap.   Wash thoroughly, paying special attention to the area where your surgery will be performed.   Thoroughly rinse your body with warm water from the neck down.   DO NOT shower/wash with your normal soap after using and rinsing off the CHG Soap.   Pat yourself dry with a CLEAN TOWEL.   Wear CLEAN PAJAMAS to bed the night before surgery   Place CLEAN SHEETS on your bed the night before your surgery   DO NOT SLEEP WITH PETS.     Day of Surgery:   Take a shower with CHG soap. Wear Clean/Comfortable clothing the morning of surgery Do not apply any deodorants/lotions.   Remember to brush your teeth WITH YOUR REGULAR TOOTHPASTE.   Please read over the following fact  sheets that you were given.

## 2021-05-29 ENCOUNTER — Telehealth (INDEPENDENT_AMBULATORY_CARE_PROVIDER_SITE_OTHER): Payer: Self-pay

## 2021-05-29 ENCOUNTER — Encounter: Payer: Self-pay | Admitting: Orthopaedic Surgery

## 2021-05-29 NOTE — Telephone Encounter (Signed)
Returned pt call. Pt states she has already spoke with a provider and they made her aware that her surgery is not canceled and she can still get it done. Pt states she doesn't have any questions or concerns

## 2021-05-29 NOTE — Telephone Encounter (Signed)
Please contact patient and advise. Nat Christen, CMA    Copied from [place] [id number]. Topic: General - Inquiry >> May 29, 2021  9:56 AM Loma Boston wrote: Pt had a preadmit for surgery yesterday and Lab results came back abnormal, pt states no one has called her but was looking at results in [place] and was wondering if ok if she went ahead with surgery tomorrow. Pls Fu with pt today to advise  [phone]

## 2021-06-04 ENCOUNTER — Other Ambulatory Visit: Payer: Self-pay

## 2021-06-04 ENCOUNTER — Encounter: Payer: Self-pay | Admitting: Surgery

## 2021-06-04 ENCOUNTER — Other Ambulatory Visit: Payer: Self-pay | Admitting: Orthopaedic Surgery

## 2021-06-04 ENCOUNTER — Ambulatory Visit (INDEPENDENT_AMBULATORY_CARE_PROVIDER_SITE_OTHER): Payer: PRIVATE HEALTH INSURANCE | Admitting: Surgery

## 2021-06-04 VITALS — BP 134/85 | HR 87 | Ht 66.0 in | Wt 188.8 lb

## 2021-06-04 DIAGNOSIS — M5020 Other cervical disc displacement, unspecified cervical region: Secondary | ICD-10-CM

## 2021-06-04 DIAGNOSIS — M4802 Spinal stenosis, cervical region: Secondary | ICD-10-CM

## 2021-06-04 LAB — SARS CORONAVIRUS 2 (TAT 6-24 HRS): SARS Coronavirus 2: NEGATIVE

## 2021-06-04 NOTE — Progress Notes (Signed)
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  States that symptoms unchanged from previous visit.  She is wanting to proceed with C4-5 and C5-6 ACDF is scheduled.  Today history and physical performed.  Review of systems negative.  Surgical procedure discussed along with potential recovery time.  Smoking cessation discussed.  Patient understands the increased risk of pseudoarthrosis due to history of smoking.  All questions answered.

## 2021-06-05 NOTE — Anesthesia Preprocedure Evaluation (Addendum)
Anesthesia Evaluation  Patient identified by MRN, date of birth, ID band Patient awake    Reviewed: Allergy & Precautions, NPO status , Patient's Chart, lab work & pertinent test results  Airway Mallampati: II  TM Distance: >3 FB Neck ROM: Full    Dental no notable dental hx. (+) Teeth Intact, Dental Advisory Given,    Pulmonary Current SmokerPatient did not abstain from smoking.,    Pulmonary exam normal breath sounds clear to auscultation       Cardiovascular hypertension, Pt. on medications Normal cardiovascular exam Rhythm:Regular Rate:Normal  05/28/21 EKG NSR R 79 w NSST changes   Neuro/Psych Bipolar Disorder PTSD   GI/Hepatic GERD  ,(+) Hepatitis -, CLab Results      Component                Value               Date                      ALT                      10                  05/28/2021                AST                      21                  05/28/2021                ALKPHOS                  87                  05/28/2021                BILITOT                  0.3                 05/28/2021              Endo/Other  negative endocrine ROS  Renal/GU Lab Results      Component                Value               Date                      CREATININE               1.03 (H)            05/28/2021                BUN                      24 (H)              05/28/2021                NA                       136                 05/28/2021  K                        3.6                 05/28/2021                CL                       104                 05/28/2021                CO2                      28                  05/28/2021                Musculoskeletal  (+) Arthritis ,   Abdominal (+) + obese (BMI 30.47),   Peds  Hematology Lab Results      Component                Value               Date                      WBC                      6.9                 05/28/2021                 HGB                      13.2                05/28/2021                HCT                      40.8                05/28/2021                MCV                      92.9                05/28/2021                PLT                      240                 05/28/2021              Anesthesia Other Findings [id number]  Reproductive/Obstetrics                            Anesthesia Physical Anesthesia Plan  ASA: 3  Anesthesia Plan: General   Post-op Pain Management:    Induction: Intravenous  PONV Risk Score and Plan: Treatment may vary due to age or medical condition, Midazolam, Dexamethasone and Ondansetron  Airway Management Planned: Video Laryngoscope Planned  Additional  Equipment: None  Intra-op Plan:   Post-operative Plan: Extubation in OR  Informed Consent: I have reviewed the patients History and Physical, chart, labs and discussed the procedure including the risks, benefits and alternatives for the proposed anesthesia with the patient or authorized representative who has indicated his/her understanding and acceptance.     Dental advisory given  Plan Discussed with: CRNA and Anesthesiologist  Anesthesia Plan Comments: (GA w ETT)       Anesthesia Quick Evaluation

## 2021-06-05 NOTE — H&P (Signed)
Theresa Cantrell is an 45 y.o. female.   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  States that symptoms unchanged from previous visit.  She is wanting to proceed with C4-5 and C5-6 ACDF is scheduled.  Today history and physical performed.  Review of systems negative  Past Medical History:  Diagnosis Date   Anxiety    Arthritis    Bipolar 1 disorder (HCC)    Depression    GERD (gastroesophageal reflux disease)    Hepatitis C    Hypertension    PTSD (post-traumatic stress disorder)     Past Surgical History:  Procedure Laterality Date   ABDOMINAL HYSTERECTOMY     CHOLECYSTECTOMY     DILATION AND CURETTAGE OF UTERUS     FOOT SURGERY Bilateral    two pins in each foot   JOINT REPLACEMENT Left 2018   partial knee replacement   KNEE SURGERY Left 2018   partial knee replacement    Family History  Problem Relation Age of Onset   Hypertension Mother    Cancer Mother        breast   Anxiety disorder Mother    Hypertension Father    Schizophrenia Father    Bipolar disorder Father    Anxiety disorder Father    Colon cancer Neg Hx    Colon polyps Neg Hx    Esophageal cancer Neg Hx    Rectal cancer Neg Hx    Stomach cancer Neg Hx    Social History:  reports that she has been smoking cigarettes. She has been smoking an average of .25 packs per day. She has never used smokeless tobacco. She reports current alcohol use of about 2.0 standard drinks of alcohol per week. She reports that she does not use drugs.  Allergies:  Allergies  Allergen Reactions   Lamotrigine Hives    No medications prior to admission.    Results for orders placed or performed in visit on 06/04/21 (from the past 48 hour(s))  SARS Coronavirus 2 (TAT 6-24 hrs)     Status: None   Collection Time: 06/04/21 12:00 AM  Result Value Ref Range   SARS Coronavirus 2 RESULT:  NEGATIVE     Comment: RESULT: NEGATIVESARS-CoV-2 INTERPRETATION:A NEGATIVE  test result means that SARS-CoV-2 RNA was not present in the specimen above the limit of detection of this test. This does not preclude a possible SARS-CoV-2 infection and should not be used as the  sole basis for patient management decisions. Negative results must be combined with clinical observations, patient history, and epidemiological information. Optimum specimen types and timing for peak viral levels during infections caused by SARS-CoV-2  have not been determined. Collection of multiple specimens or types of specimens may be necessary to detect virus. Improper specimen collection and handling, sequence variability under primers/probes, or organism present below the limit of detection may  lead to false negative results. Positive and negative predictive values of testing are highly dependent on prevalence. False negative test results are more likely when prevalence of disease is high.The expected result is NEGATIVE.Fact S heet for  Healthcare Providers: LocalChronicle.no Sheet for Patients: SalonLookup.es Reference Range - Negative    No results found.  Review of Systems  Constitutional:  Positive for activity change.  HENT: Negative.    Respiratory: Negative.    Cardiovascular: Negative.   Gastrointestinal: Negative.   Genitourinary: Negative.  Musculoskeletal:  Positive for neck pain and neck stiffness.  Neurological:  Positive for numbness.  Psychiatric/Behavioral: Negative.     There were no vitals taken for this visit. Physical Exam HENT:     Head: Normocephalic and atraumatic.  Eyes:     Extraocular Movements: Extraocular movements intact.  Cardiovascular:     Rate and Rhythm: Regular rhythm.     Heart sounds: Normal heart sounds.  Pulmonary:     Breath sounds: Normal breath sounds.  Musculoskeletal:        General: Tenderness  present.  Neurological:     Mental Status: She is alert and oriented to person, place, and time.     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  Surgical procedure discussed and all questions answered.   Benjiman Core, PA-C 06/05/2021, 3:43 PM

## 2021-06-06 ENCOUNTER — Ambulatory Visit (HOSPITAL_COMMUNITY): Payer: PRIVATE HEALTH INSURANCE | Admitting: Anesthesiology

## 2021-06-06 ENCOUNTER — Encounter (HOSPITAL_COMMUNITY): Payer: Self-pay | Admitting: Orthopaedic Surgery

## 2021-06-06 ENCOUNTER — Encounter (HOSPITAL_COMMUNITY)
Admission: RE | Disposition: A | Discharge status: Home / Self Care | Payer: Self-pay | Source: Home / Self Care | Attending: Orthopaedic Surgery

## 2021-06-06 ENCOUNTER — Ambulatory Visit (HOSPITAL_COMMUNITY): Payer: PRIVATE HEALTH INSURANCE

## 2021-06-06 ENCOUNTER — Observation Stay (HOSPITAL_COMMUNITY)
Admission: RE | Admit: 2021-06-06 | Discharge: 2021-06-07 | Disposition: A | Discharge status: Home / Self Care | Payer: PRIVATE HEALTH INSURANCE | Attending: Orthopaedic Surgery | Admitting: Orthopaedic Surgery

## 2021-06-06 DIAGNOSIS — F17210 Nicotine dependence, cigarettes, uncomplicated: Secondary | ICD-10-CM | POA: Diagnosis not present

## 2021-06-06 DIAGNOSIS — M5020 Other cervical disc displacement, unspecified cervical region: Secondary | ICD-10-CM | POA: Diagnosis present

## 2021-06-06 DIAGNOSIS — M50121 Cervical disc disorder at C4-C5 level with radiculopathy: Principal | ICD-10-CM | POA: Insufficient documentation

## 2021-06-06 DIAGNOSIS — M4802 Spinal stenosis, cervical region: Secondary | ICD-10-CM | POA: Insufficient documentation

## 2021-06-06 DIAGNOSIS — M47892 Other spondylosis, cervical region: Secondary | ICD-10-CM | POA: Diagnosis not present

## 2021-06-06 DIAGNOSIS — M50221 Other cervical disc displacement at C4-C5 level: Secondary | ICD-10-CM | POA: Diagnosis present

## 2021-06-06 DIAGNOSIS — Z96652 Presence of left artificial knee joint: Secondary | ICD-10-CM | POA: Diagnosis not present

## 2021-06-06 DIAGNOSIS — Z419 Encounter for procedure for purposes other than remedying health state, unspecified: Secondary | ICD-10-CM

## 2021-06-06 DIAGNOSIS — I10 Essential (primary) hypertension: Secondary | ICD-10-CM | POA: Diagnosis not present

## 2021-06-06 HISTORY — PX: NECK SURGERY: SHX720

## 2021-06-06 HISTORY — DX: Gastro-esophageal reflux disease without esophagitis: K21.9

## 2021-06-06 HISTORY — PX: ANTERIOR CERVICAL DECOMP/DISCECTOMY FUSION: SHX1161

## 2021-06-06 LAB — ABO/RH: ABO/RH(D): O POS

## 2021-06-06 SURGERY — ANTERIOR CERVICAL DECOMPRESSION/DISCECTOMY FUSION 2 LEVELS
Anesthesia: General | Site: Spine Cervical

## 2021-06-06 MED ORDER — ONDANSETRON HCL 4 MG/2ML IJ SOLN
INTRAMUSCULAR | Status: AC
  Filled 2021-06-06: qty 2

## 2021-06-06 MED ORDER — HYDROMORPHONE HCL 1 MG/ML IJ SOLN
0.2500 mg | INTRAMUSCULAR | Status: DC | PRN
  Administered 2021-06-06: 0.25 mg via INTRAVENOUS

## 2021-06-06 MED ORDER — ACETAMINOPHEN 10 MG/ML IV SOLN
1000.0000 mg | Freq: Once | INTRAVENOUS | Status: DC | PRN
  Administered 2021-06-06: 1000 mg via INTRAVENOUS

## 2021-06-06 MED ORDER — PROPOFOL 10 MG/ML IV BOLUS
INTRAVENOUS | Status: AC
  Filled 2021-06-06: qty 20

## 2021-06-06 MED ORDER — DEXAMETHASONE SODIUM PHOSPHATE 10 MG/ML IJ SOLN
INTRAMUSCULAR | Status: DC | PRN
  Administered 2021-06-06: 10 mg via INTRAVENOUS

## 2021-06-06 MED ORDER — SUGAMMADEX SODIUM 200 MG/2ML IV SOLN
INTRAVENOUS | Status: DC | PRN
  Administered 2021-06-06: 200 mg via INTRAVENOUS

## 2021-06-06 MED ORDER — LIDOCAINE 2% (20 MG/ML) 5 ML SYRINGE
INTRAMUSCULAR | Status: DC | PRN
  Administered 2021-06-06: 100 mg via INTRAVENOUS

## 2021-06-06 MED ORDER — CHLORHEXIDINE GLUCONATE 0.12 % MT SOLN: 15.0000 mL | Freq: Once | OROMUCOSAL | Status: AC

## 2021-06-06 MED ORDER — ARIPIPRAZOLE 10 MG PO TABS
10.0000 mg | ORAL_TABLET | Freq: Every day | ORAL | Status: DC
  Filled 2021-06-06 (×2): qty 1

## 2021-06-06 MED ORDER — SODIUM CHLORIDE 0.9% FLUSH
3.0000 mL | Freq: Two times a day (BID) | INTRAVENOUS | Status: DC
  Administered 2021-06-06 (×2): 3 mL via INTRAVENOUS

## 2021-06-06 MED ORDER — POLYETHYLENE GLYCOL 3350 17 G PO PACK
17.0000 g | PACK | Freq: Every day | ORAL | Status: DC
  Administered 2021-06-06 – 2021-06-07 (×2): 17 g via ORAL
  Filled 2021-06-06 (×2): qty 1

## 2021-06-06 MED ORDER — HYDROMORPHONE HCL 1 MG/ML IJ SOLN: 0.5000 mg | INTRAMUSCULAR | Status: DC | PRN

## 2021-06-06 MED ORDER — FLUOXETINE HCL 20 MG PO CAPS
20.0000 mg | ORAL_CAPSULE | Freq: Every day | ORAL | Status: DC
  Filled 2021-06-06: qty 1

## 2021-06-06 MED ORDER — FAMOTIDINE 20 MG PO TABS
20.0000 mg | ORAL_TABLET | Freq: Two times a day (BID) | ORAL | Status: DC
  Administered 2021-06-06 – 2021-06-07 (×2): 20 mg via ORAL
  Filled 2021-06-06 (×3): qty 1

## 2021-06-06 MED ORDER — AMLODIPINE BESYLATE 5 MG PO TABS
10.0000 mg | ORAL_TABLET | Freq: Every day | ORAL | Status: DC
  Administered 2021-06-07: 10 mg via ORAL
  Filled 2021-06-06: qty 2

## 2021-06-06 MED ORDER — METHOCARBAMOL 1000 MG/10ML IJ SOLN
500.0000 mg | Freq: Four times a day (QID) | INTRAVENOUS | Status: DC | PRN
  Filled 2021-06-06: qty 5

## 2021-06-06 MED ORDER — METHOCARBAMOL 500 MG PO TABS: 500.0000 mg | ORAL_TABLET | Freq: Four times a day (QID) | ORAL | 0 refills | Status: DC

## 2021-06-06 MED ORDER — SODIUM CHLORIDE 0.9% FLUSH: 3.0000 mL | INTRAVENOUS | Status: DC | PRN

## 2021-06-06 MED ORDER — SODIUM CHLORIDE 0.9 % IV SOLN: 250.0000 mL | INTRAVENOUS | Status: DC

## 2021-06-06 MED ORDER — DEXAMETHASONE SODIUM PHOSPHATE 10 MG/ML IJ SOLN
INTRAMUSCULAR | Status: AC
  Filled 2021-06-06: qty 1

## 2021-06-06 MED ORDER — PHENOL 1.4 % MT LIQD: 1.0000 | OROMUCOSAL | Status: DC | PRN

## 2021-06-06 MED ORDER — LACTATED RINGERS IV SOLN: INTRAVENOUS | Status: DC

## 2021-06-06 MED ORDER — BUPIVACAINE HCL 0.25 % IJ SOLN
INTRAMUSCULAR | Status: DC | PRN
  Administered 2021-06-06: 6 mL

## 2021-06-06 MED ORDER — ORAL CARE MOUTH RINSE: 15.0000 mL | Freq: Once | OROMUCOSAL | Status: AC

## 2021-06-06 MED ORDER — OXYCODONE HCL 5 MG/5ML PO SOLN: 5.0000 mg | Freq: Once | ORAL | Status: DC | PRN

## 2021-06-06 MED ORDER — METHOCARBAMOL 500 MG PO TABS
500.0000 mg | ORAL_TABLET | Freq: Four times a day (QID) | ORAL | Status: DC | PRN
  Administered 2021-06-06 – 2021-06-07 (×4): 500 mg via ORAL
  Filled 2021-06-06 (×4): qty 1

## 2021-06-06 MED ORDER — HEMOSTATIC AGENTS (NO CHARGE) OPTIME
TOPICAL | Status: DC | PRN
  Administered 2021-06-06: 1 via TOPICAL

## 2021-06-06 MED ORDER — DOCUSATE SODIUM 100 MG PO CAPS
100.0000 mg | ORAL_CAPSULE | Freq: Two times a day (BID) | ORAL | Status: DC
  Administered 2021-06-06 – 2021-06-07 (×3): 100 mg via ORAL
  Filled 2021-06-06 (×3): qty 1

## 2021-06-06 MED ORDER — ONDANSETRON HCL 4 MG PO TABS: 4.0000 mg | ORAL_TABLET | Freq: Four times a day (QID) | ORAL | Status: DC | PRN

## 2021-06-06 MED ORDER — OXYCODONE HCL 5 MG PO TABS
5.0000 mg | ORAL_TABLET | ORAL | Status: DC | PRN
  Administered 2021-06-06 – 2021-06-07 (×5): 5 mg via ORAL
  Filled 2021-06-06 (×5): qty 1

## 2021-06-06 MED ORDER — CEFAZOLIN SODIUM-DEXTROSE 2-4 GM/100ML-% IV SOLN
INTRAVENOUS | Status: AC
  Filled 2021-06-06: qty 100

## 2021-06-06 MED ORDER — FENTANYL CITRATE (PF) 250 MCG/5ML IJ SOLN
INTRAMUSCULAR | Status: AC
  Filled 2021-06-06: qty 5

## 2021-06-06 MED ORDER — VITAMIN D 25 MCG (1000 UNIT) PO TABS
2000.0000 [IU] | ORAL_TABLET | Freq: Every day | ORAL | Status: DC
  Administered 2021-06-06 – 2021-06-07 (×2): 2000 [IU] via ORAL
  Filled 2021-06-06 (×2): qty 2

## 2021-06-06 MED ORDER — ACETAMINOPHEN 10 MG/ML IV SOLN
INTRAVENOUS | Status: AC
  Filled 2021-06-06: qty 100

## 2021-06-06 MED ORDER — MENTHOL 3 MG MT LOZG: 1.0000 | LOZENGE | OROMUCOSAL | Status: DC | PRN

## 2021-06-06 MED ORDER — ACETAMINOPHEN 325 MG PO TABS
650.0000 mg | ORAL_TABLET | ORAL | Status: DC | PRN
  Administered 2021-06-07: 650 mg via ORAL
  Filled 2021-06-06: qty 2

## 2021-06-06 MED ORDER — MIDAZOLAM HCL 2 MG/2ML IJ SOLN
INTRAMUSCULAR | Status: DC | PRN
Start: 2021-06-06 — End: 2021-06-06
  Administered 2021-06-06: 2 mg via INTRAVENOUS

## 2021-06-06 MED ORDER — HYDROXYZINE HCL 10 MG PO TABS
10.0000 mg | ORAL_TABLET | Freq: Three times a day (TID) | ORAL | Status: DC | PRN
  Filled 2021-06-06: qty 1

## 2021-06-06 MED ORDER — CHLORHEXIDINE GLUCONATE 0.12 % MT SOLN
OROMUCOSAL | Status: AC
  Administered 2021-06-06: 15 mL via OROMUCOSAL
  Filled 2021-06-06: qty 15

## 2021-06-06 MED ORDER — NICOTINE 14 MG/24HR TD PT24
14.0000 mg | MEDICATED_PATCH | Freq: Every day | TRANSDERMAL | Status: DC
  Filled 2021-06-06 (×2): qty 1

## 2021-06-06 MED ORDER — ONDANSETRON HCL 4 MG/2ML IJ SOLN: 4.0000 mg | Freq: Once | INTRAMUSCULAR | Status: DC | PRN

## 2021-06-06 MED ORDER — ONDANSETRON HCL 4 MG/2ML IJ SOLN: 4.0000 mg | Freq: Four times a day (QID) | INTRAMUSCULAR | Status: DC | PRN

## 2021-06-06 MED ORDER — DEXMEDETOMIDINE HCL IN NACL 200 MCG/50ML IV SOLN
INTRAVENOUS | Status: AC
  Filled 2021-06-06: qty 50

## 2021-06-06 MED ORDER — 0.9 % SODIUM CHLORIDE (POUR BTL) OPTIME
TOPICAL | Status: DC | PRN
  Administered 2021-06-06: 1000 mL

## 2021-06-06 MED ORDER — OXYCODONE HCL 5 MG PO TABS: 5.0000 mg | ORAL_TABLET | Freq: Once | ORAL | Status: DC | PRN

## 2021-06-06 MED ORDER — OXYCODONE-ACETAMINOPHEN 5-325 MG PO TABS: 1.0000 | ORAL_TABLET | ORAL | 0 refills | Status: DC | PRN

## 2021-06-06 MED ORDER — AMISULPRIDE (ANTIEMETIC) 5 MG/2ML IV SOLN: 10.0000 mg | Freq: Once | INTRAVENOUS | Status: DC | PRN

## 2021-06-06 MED ORDER — DEXMEDETOMIDINE HCL IN NACL 200 MCG/50ML IV SOLN
INTRAVENOUS | Status: DC | PRN
  Administered 2021-06-06: 12 ug via INTRAVENOUS

## 2021-06-06 MED ORDER — SODIUM CHLORIDE 0.9 % IV SOLN: INTRAVENOUS | Status: DC

## 2021-06-06 MED ORDER — CEFAZOLIN SODIUM-DEXTROSE 2-4 GM/100ML-% IV SOLN
2.0000 g | INTRAVENOUS | Status: AC
  Administered 2021-06-06: 2 g via INTRAVENOUS

## 2021-06-06 MED ORDER — TRAZODONE HCL 50 MG PO TABS: 50.0000 mg | ORAL_TABLET | Freq: Every evening | ORAL | Status: DC | PRN

## 2021-06-06 MED ORDER — HYDROMORPHONE HCL 1 MG/ML IJ SOLN
INTRAMUSCULAR | Status: AC
  Filled 2021-06-06: qty 1

## 2021-06-06 MED ORDER — PROPOFOL 10 MG/ML IV BOLUS
INTRAVENOUS | Status: DC | PRN
  Administered 2021-06-06: 140 mg via INTRAVENOUS

## 2021-06-06 MED ORDER — PRAZOSIN HCL 1 MG PO CAPS
1.0000 mg | ORAL_CAPSULE | Freq: Every day | ORAL | Status: DC
  Administered 2021-06-06: 1 mg via ORAL
  Filled 2021-06-06: qty 1

## 2021-06-06 MED ORDER — MIDAZOLAM HCL 2 MG/2ML IJ SOLN
INTRAMUSCULAR | Status: AC
  Filled 2021-06-06: qty 2

## 2021-06-06 MED ORDER — ROCURONIUM BROMIDE 10 MG/ML (PF) SYRINGE
PREFILLED_SYRINGE | INTRAVENOUS | Status: DC | PRN
  Administered 2021-06-06: 55 mg via INTRAVENOUS
  Administered 2021-06-06: 15 mg via INTRAVENOUS

## 2021-06-06 MED ORDER — FENTANYL CITRATE (PF) 100 MCG/2ML IJ SOLN
INTRAMUSCULAR | Status: DC | PRN
  Administered 2021-06-06: 50 ug via INTRAVENOUS
  Administered 2021-06-06: 100 ug via INTRAVENOUS
  Administered 2021-06-06 (×2): 50 ug via INTRAVENOUS

## 2021-06-06 MED ORDER — ONDANSETRON HCL 4 MG/2ML IJ SOLN
INTRAMUSCULAR | Status: DC | PRN
  Administered 2021-06-06: 4 mg via INTRAVENOUS

## 2021-06-06 MED ORDER — BUPIVACAINE HCL (PF) 0.25 % IJ SOLN
INTRAMUSCULAR | Status: AC
  Filled 2021-06-06: qty 30

## 2021-06-06 MED ORDER — ACETAMINOPHEN 650 MG RE SUPP: 650.0000 mg | RECTAL | Status: DC | PRN

## 2021-06-06 SURGICAL SUPPLY — 62 items
AGENT HMST KT MTR STRL THRMB (HEMOSTASIS) ×1
APL SKNCLS STERI-STRIP NONHPOA (GAUZE/BANDAGES/DRESSINGS) ×1
BAG COUNTER SPONGE SURGICOUNT (BAG) ×2 IMPLANT
BAG SPNG CNTER NS LX DISP (BAG) ×1
BENZOIN TINCTURE PRP APPL 2/3 (GAUZE/BANDAGES/DRESSINGS) ×2 IMPLANT
BIT DRILL SM SPINE QC 12 (BIT) ×1 IMPLANT
BLADE CLIPPER SURG (BLADE) IMPLANT
BONE CC-ACS 11X14 X8 6D (Bone Implant) ×2 IMPLANT
BONE CC-ACS 11X14X7 6D (Bone Implant) ×2 IMPLANT
BUR ROUND FLUTED 4 SOFT TCH (BURR) ×1 IMPLANT
CHIPS BONE CANC-ACS 11X14X8 6D (Bone Implant) IMPLANT
CHIPS BONE CANC-ACS11X14X7 6D (Bone Implant) IMPLANT
CLSR STERI-STRIP ANTIMIC 1/2X4 (GAUZE/BANDAGES/DRESSINGS) ×1 IMPLANT
COLLAR CERV LO CONTOUR FIRM DE (SOFTGOODS) ×2 IMPLANT
CORD BIPOLAR FORCEPS 12FT (ELECTRODE) ×2 IMPLANT
COVER SURGICAL LIGHT HANDLE (MISCELLANEOUS) ×2 IMPLANT
DRAPE C-ARM 42X72 X-RAY (DRAPES) ×2 IMPLANT
DRAPE HALF SHEET 40X57 (DRAPES) ×5 IMPLANT
DRAPE MICROSCOPE LEICA (MISCELLANEOUS) ×2 IMPLANT
DRAPE POUCH INSTRU U-SHP 10X18 (DRAPES) ×1 IMPLANT
DURAPREP 6ML APPLICATOR 50/CS (WOUND CARE) ×2 IMPLANT
ELECT COATED BLADE 2.86 ST (ELECTRODE) ×2 IMPLANT
ELECT REM PT RETURN 9FT ADLT (ELECTROSURGICAL) ×2
ELECTRODE REM PT RTRN 9FT ADLT (ELECTROSURGICAL) ×1 IMPLANT
EVACUATOR 1/8 PVC DRAIN (DRAIN) ×2 IMPLANT
GAUZE SPONGE 4X4 12PLY STRL (GAUZE/BANDAGES/DRESSINGS) ×2 IMPLANT
GLOVE SRG 8 PF TXTR STRL LF DI (GLOVE) ×2 IMPLANT
GLOVE SURG ORTHO LTX SZ7.5 (GLOVE) ×4 IMPLANT
GLOVE SURG UNDER POLY LF SZ8 (GLOVE) ×4
GOWN STRL REUS W/ TWL LRG LVL3 (GOWN DISPOSABLE) ×1 IMPLANT
GOWN STRL REUS W/ TWL XL LVL3 (GOWN DISPOSABLE) ×1 IMPLANT
GOWN STRL REUS W/TWL 2XL LVL3 (GOWN DISPOSABLE) ×2 IMPLANT
GOWN STRL REUS W/TWL LRG LVL3 (GOWN DISPOSABLE) ×2
GOWN STRL REUS W/TWL XL LVL3 (GOWN DISPOSABLE) ×2
HALTER HD/CHIN CERV TRACTION D (MISCELLANEOUS) ×2 IMPLANT
KIT BASIN OR (CUSTOM PROCEDURE TRAY) ×2 IMPLANT
KIT TURNOVER KIT B (KITS) ×2 IMPLANT
MANIFOLD NEPTUNE II (INSTRUMENTS) IMPLANT
MARKER SKIN DUAL TIP RULER LAB (MISCELLANEOUS) ×1 IMPLANT
NDL 25GX 5/8IN NON SAFETY (NEEDLE) ×1 IMPLANT
NEEDLE 25GX 5/8IN NON SAFETY (NEEDLE) ×2 IMPLANT
NS IRRIG 1000ML POUR BTL (IV SOLUTION) ×2 IMPLANT
PACK ORTHO CERVICAL (CUSTOM PROCEDURE TRAY) ×2 IMPLANT
PAD ARMBOARD 7.5X6 YLW CONV (MISCELLANEOUS) ×4 IMPLANT
PIN TEMP FIXATION SMOOTH SU (EXFIX) ×1 IMPLANT
PLATE ANT CERV XTEND 2 LV 30 (Plate) ×1 IMPLANT
POSITIONER HEAD DONUT 9IN (MISCELLANEOUS) ×2 IMPLANT
RESTRAINT LIMB HOLDER UNIV (RESTRAINTS) ×1 IMPLANT
SCREW XTD VAR 4.2 SELF TAP 12 (Screw) ×6 IMPLANT
SPONGE INTESTINAL PEANUT (DISPOSABLE) ×2 IMPLANT
STRIP CLOSURE SKIN 1/2X4 (GAUZE/BANDAGES/DRESSINGS) ×2 IMPLANT
SURGIFLO W/THROMBIN 8M KIT (HEMOSTASIS) ×1 IMPLANT
SUT BONE WAX W31G (SUTURE) ×2 IMPLANT
SUT SILK 2 0 (SUTURE) ×2
SUT SILK 2-0 18XBRD TIE 12 (SUTURE) IMPLANT
SUT VIC AB 3-0 X1 27 (SUTURE) ×2 IMPLANT
SUT VIC AB 4-0 PS2 18 (SUTURE) ×1 IMPLANT
SYR BULB IRRIG 60ML STRL (SYRINGE) ×1 IMPLANT
TOWEL GREEN STERILE (TOWEL DISPOSABLE) ×2 IMPLANT
TOWEL GREEN STERILE FF (TOWEL DISPOSABLE) ×2 IMPLANT
TRAY FOLEY W/BAG SLVR 16FR (SET/KITS/TRAYS/PACK) ×2
TRAY FOLEY W/BAG SLVR 16FR ST (SET/KITS/TRAYS/PACK) IMPLANT

## 2021-06-06 NOTE — Anesthesia Procedure Notes (Signed)
Procedure Name: Intubation Date/Time: 06/06/2021 7:37 AM Performed by: Moshe Salisbury, CRNA Pre-anesthesia Checklist: Patient identified, Emergency Drugs available, Suction available and Patient being monitored Patient Re-evaluated:Patient Re-evaluated prior to induction Oxygen Delivery Method: Circle System Utilized Preoxygenation: Pre-oxygenation with 100% oxygen Induction Type: IV induction Ventilation: Mask ventilation without difficulty Laryngoscope Size: Glidescope and 3 Tube type: Oral Tube size: 7.5 mm Number of attempts: 1 Airway Equipment and Method: Rigid stylet and Video-laryngoscopy Placement Confirmation: ETT inserted through vocal cords under direct vision, positive ETCO2 and breath sounds checked- equal and bilateral Secured at: 22 cm Tube secured with: Tape Dental Injury: Teeth and Oropharynx as per pre-operative assessment

## 2021-06-06 NOTE — Transfer of Care (Signed)
Immediate Anesthesia Transfer of Care Note  Patient: Theresa Cantrell  Procedure(s) Performed: C4-5, C5-6 ANTERIOR CERVICAL DISCECTOMY FUSION, ALLOGRAFT, PLATE (Spine Cervical)  Patient Location: PACU  Anesthesia Type:General  Level of Consciousness: drowsy and patient cooperative  Airway & Oxygen Therapy: Patient Spontanous Breathing and Patient connected to nasal cannula oxygen  Post-op Assessment: Report given to RN, Post -op Vital signs reviewed and stable and Patient moving all extremities  Post vital signs: Reviewed and stable  Last Vitals:  Vitals Value Taken Time  BP 155/85 06/06/21 1002  Temp    Pulse 74 06/06/21 1003  Resp 49 06/06/21 1003  SpO2 99 % 06/06/21 1003  Vitals shown include unvalidated device data.  Last Pain:  Vitals:   06/06/21 0605  TempSrc: Oral  PainSc: 5       Patients Stated Pain Goal: 2 ([id number])  Complications: No notable events documented.

## 2021-06-06 NOTE — Anesthesia Postprocedure Evaluation (Signed)
Anesthesia Post Note  Patient: Theresa Cantrell  Procedure(s) Performed: C4-5, C5-6 ANTERIOR CERVICAL DISCECTOMY FUSION, ALLOGRAFT, PLATE (Spine Cervical)     Patient location during evaluation: PACU Anesthesia Type: General Level of consciousness: awake and alert Pain management: pain level controlled Vital Signs Assessment: post-procedure vital signs reviewed and stable Respiratory status: spontaneous breathing, nonlabored ventilation, respiratory function stable and patient connected to nasal cannula oxygen Cardiovascular status: blood pressure returned to baseline and stable Postop Assessment: no apparent nausea or vomiting Anesthetic complications: no   No notable events documented.  Last Vitals:  Vitals:   06/06/21 1150 06/06/21 1209  BP: 132/86 (!) 142/90  Pulse: 86 78  Resp: 13 18  Temp:  36.7 C  SpO2: 96% 99%    Last Pain:  Vitals:   06/06/21 1209  TempSrc: Oral  PainSc:                  Barnet Glasgow

## 2021-06-06 NOTE — Op Note (Signed)
Preop diagnosis: C4-5, C5-6 cervical spondylosis with disc protrusion and compression.  Postop diagnosis: Same  Procedure: C4-5, C5-6 anterior cervical discectomy and fusion, allograft and plate.  Surgeon: Rodell Perna, MD  Assistant: Benjiman Core, PA-C medically necessary and present for the entire procedure  Anesthesia: General orotracheal +6 cc Marcaine skin local at the end of the case.  Drains 1 Hemovac neck.  Implants Globus XTend 30 mm plate.  12 mm screws x6.  Using 8 mm graft at C4-5. MTF 6 degree lordotic and 7 mm cortical cancellous graft at the C5-6 level.  Complications: None  Procedure: After standard prepping and draping orotracheal intubation ET tube had to be changed since there was a leak in the cuff for the cuff from a ruptured.  Once airway secured prepping and draping was performed usual area squared with towel sterile skin marker Betadine Steri-Drape sterile Mayo stand the head and thyroid sheets and drapes.  Timeout procedure was completed Ancef prophylaxis 2 g.  Incision was made the prominent skin fold starting at the midline extending to the left.  Platysma was divided in line with the fibers prominent superficial vein was identified ligated cut.  Blunt dissection down longus Coley and neck initially needle was placed at C6-7.  We moved to the cephalad side of the omohyoid and initial placement was in the 3 4 level.  We slid down to 4 5 took a chunk out of the disc confirmed with a lateral C arm and then proceeded with self-retaining retractors teeth blades right and left smooth blade cephalad caudad using the Cloward setting.  Operative microscope was used we progressed down the posterior longitudinal ligament there was extruded fragments of disc which were removed with paracentral compression.  Overhanging spurs were removed and disc base was only about 4 mm.  1 spurs are decompressed complete decompression of the dura trial sizers.  Progressed and an 8 mm graft was placed.   Countersunk 2 mm.  Self-retaining retractors were then moved down to the C5-6 level.  Identical procedure performed at the C5-6 level with decompression microscope assisted removal of the posterior longitudinal ligament removal of left paracentral fragment that was causing compression.  Uncovertebral joints were stripped and a 7 mm graft was placed at the C5-6 level with good snug fit countersunk 2 mm.  Anterior spurs were removed.  30 mm plate was selected held with the pin.  The inferior screws in C6 had be angled caudally slightly but were in bone and did not hit the graft.  To top screws in C4 were parallel to the endplate.  Final spot pictures including oblique were taken and then plate was locked down with a tiny screwdriver.  Operative field was dry Hemovacs placed with a separate and out technique.  3 oh platysma closed with interrupted Vicryl 4-0 subcuticular closure tincture benzoin Steri-Strips 4 x 4's soft cervical collar and tape was applied.  Patient tolerated procedure well transfer the care room in stable condition.

## 2021-06-06 NOTE — Anesthesia Procedure Notes (Signed)
Procedure Name: Intubation Date/Time: 06/06/2021 7:46 AM Performed by: Moshe Salisbury, CRNA Pre-anesthesia Checklist: Patient identified, Emergency Drugs available, Suction available and Patient being monitored Patient Re-evaluated:Patient Re-evaluated prior to induction Oxygen Delivery Method: Circle System Utilized Preoxygenation: Pre-oxygenation with 100% oxygen Laryngoscope Size: Glidescope Tube type: Oral Tube size: 7.0 mm Number of attempts: 1 Airway Equipment and Method: Bougie stylet Placement Confirmation: ETT inserted through vocal cords under direct vision, positive ETCO2 and breath sounds checked- equal and bilateral Secured at: 22 cm Tube secured with: Tape Dental Injury: Teeth and Oropharynx as per pre-operative assessment

## 2021-06-06 NOTE — Progress Notes (Signed)
Orthopedic Tech Progress Note Patient Details:  Theresa Cantrell [birth year] [id number] Dropped off collar for shower at home Ortho Devices Type of Ortho Device: Soft collar Ortho Device/Splint Location: neck Ortho Device/Splint Interventions: Ordered   Post Interventions Patient Tolerated: Other (comment) Instructions Provided: Other (comment)  Ellouise Newer 06/06/2021, 10:51 PM

## 2021-06-06 NOTE — Interval H&P Note (Signed)
History and Physical Interval Note:  06/06/2021 7:13 AM  Theresa Cantrell  has presented today for surgery, with the diagnosis of C4-5, C5-6 cervical stenosis.  The various methods of treatment have been discussed with the patient and family. After consideration of risks, benefits and other options for treatment, the patient has consented to  Procedure(s): C4-5, C5-6 ANTERIOR CERVICAL DISCECTOMY FUSION, ALLOGRAFT, PLATE (N/A) as a surgical intervention.  The patient's history has been reviewed, patient examined, no change in status, stable for surgery.  I have reviewed the patient's chart and labs.  Questions were answered to the patient's satisfaction.     Marybelle Killings

## 2021-06-06 NOTE — Discharge Instructions (Signed)
Ok to shower 5 days postop.  Do not apply any creams or ointments to incision.  Do not remove steri-strips.  Can use 4x4 gauze and tape for dressing changes.  No aggressive activity. Cervical collar must be on at all times even when showering.  Do not bend or turn neck.     No driving  No lifting, pushing, pulling.

## 2021-06-07 DIAGNOSIS — M50121 Cervical disc disorder at C4-C5 level with radiculopathy: Secondary | ICD-10-CM | POA: Diagnosis not present

## 2021-06-07 NOTE — Evaluation (Signed)
Occupational Therapy Evaluation Patient Details Name: Theresa Cantrell MRN: [id number] DOB: Oct 11, 1976 Today's Date: 06/07/2021    History of Present Illness Pt is a 45 y/o female admitted 06/06/21 s/p C4-5 C5-6 ACDF. PMH includes: anxiety, arthrtis, bipolar 1, hepatitis C, HTN, PTSD.   Clinical Impression   PTA patient independent using cane for mobility, ADLs and IADLs. Admitted or above and limited by problem list below, including cervical precaution, pain and decreased activity tolerance. Patient educated on brace mgmt and wear schedule, ADL compensatory techniques, precautions and activity progression.  She will have support from her mom for 2 weeks after dc home.  She is able to complete ADLs and mobility/transfers in room with supervision using cane. Reports significantly improved UE use, pain remains in R shoulder.  Based on performance today, no further OT needs have been identified and OT will sign off.  Thank you for this referral!     Follow Up Recommendations  No OT follow up;Supervision - Intermittent    Equipment Recommendations  3 in 1 bedside commode    Recommendations for Other Services       Precautions / Restrictions Precautions Precautions: Cervical Precaution Booklet Issued: Yes (comment) Precaution Comments: reviewed with pt, good understanding Required Braces or Orthoses: Cervical Brace Cervical Brace: Soft collar Restrictions Weight Bearing Restrictions: No      Mobility Bed Mobility Overal bed mobility: Needs Assistance Bed Mobility: Sidelying to Sit;Rolling;Sit to Sidelying Rolling: Supervision Sidelying to sit: Supervision     Sit to sidelying: Supervision General bed mobility comments: for safety, cueing for log roll technique and simulated from home setup; increased time required    Transfers Overall transfer level: Needs assistance Equipment used: Straight cane Transfers: Sit to/from Stand Sit to Stand: Supervision         General  transfer comment: for safety, cueing for posture    Balance Overall balance assessment: Mild deficits observed, not formally tested                                         ADL either performed or assessed with clinical judgement   ADL Overall ADL's : Needs assistance/impaired     Grooming: Supervision/safety;Standing           Upper Body Dressing : Set up;Cueing for compensatory techniques;Sitting   Lower Body Dressing: Sit to/from stand;Supervision/safety;Cueing for compensatory techniques   Toilet Transfer: Supervision/safety;Ambulation;BSC (cane) Toilet Transfer Details (indicate cue type and reason): BSC over commode   Toileting - Clothing Manipulation Details (indicate cue type and reason): reviewed techniques Tub/ Shower Transfer: Tub transfer;Min guard;Ambulation;3 in 1 Tub/Shower Transfer Details (indicate cue type and reason): reviewed technique with UE support over threshold, plans to have assist initally; educated on using BSC as Gilbert Functional mobility during ADLs: Supervision/safety;Cane       Vision   Vision Assessment?: No apparent visual deficits     Perception     Praxis      Pertinent Vitals/Pain Pain Assessment: Faces Faces Pain Scale: Hurts little more Pain Location: incisonal, R shoulder Pain Descriptors / Indicators: Grimacing;Guarding;Discomfort;Operative site guarding Pain Intervention(s): Limited activity within patient's tolerance;Monitored during session;Premedicated before session;Repositioned     Hand Dominance Right   Extremity/Trunk Assessment Upper Extremity Assessment Upper Extremity Assessment: Overall WFL for tasks assessed (pain R shoulder, limited to 90* Bil due to cervical prec)   Lower Extremity Assessment Lower Extremity Assessment: Overall WFL for  tasks assessed   Cervical / Trunk Assessment Cervical / Trunk Assessment: Other exceptions Cervical / Trunk Exceptions: s/p cervical sx   Communication  Communication Communication: No difficulties   Cognition Arousal/Alertness: Awake/alert Behavior During Therapy: WFL for tasks assessed/performed                                       General Comments       Exercises     Shoulder Instructions      Home Living Family/patient expects to be discharged to:: Private residence Living Arrangements: Children;Parent Available Help at Discharge: Family;Available 24 hours/day Type of Home: Apartment Home Access: Level entry     Home Layout: One level     Bathroom Shower/Tub: Teacher, early years/pre: Standard     Home Equipment: Cane - single point   Additional Comments: her mom plans to assist for 2 weeks      Prior Functioning/Environment Level of Independence: Independent with assistive device(s)        Comments: independent using cane, ADLs IADLs        OT Problem List: Decreased activity tolerance;Decreased strength;Decreased safety awareness;Decreased knowledge of use of DME or AE;Decreased knowledge of precautions;Pain      OT Treatment/Interventions:      OT Goals(Current goals can be found in the care plan section) Acute Rehab OT Goals Patient Stated Goal: home OT Goal Formulation: With patient  OT Frequency:     Barriers to D/C:            Co-evaluation              AM-PAC OT "6 Clicks" Daily Activity     Outcome Measure Help from another person eating meals?: None Help from another person taking care of personal grooming?: A Little Help from another person toileting, which includes using toliet, bedpan, or urinal?: A Little Help from another person bathing (including washing, rinsing, drying)?: A Little Help from another person to put on and taking off regular upper body clothing?: A Little Help from another person to put on and taking off regular lower body clothing?: A Little 6 Click Score: 19   End of Session Equipment Utilized During Treatment: Cervical  collar Nurse Communication: Mobility status  Activity Tolerance: Patient tolerated treatment well Patient left: in bed;with call bell/phone within reach  OT Visit Diagnosis: Pain Pain - part of body:  (neck, R shoulder)                Time: [id number] OT Time Calculation (min): 23 min Charges:  OT General Charges $OT Visit: 1 Visit OT Evaluation $OT Eval Low Complexity: 1 Low OT Treatments $Self Care/Home Management : 8-22 mins  Jolaine Artist, OT Acute Rehabilitation Services Pager [phone] Office [phone]   Theresa Cantrell 06/07/2021, 9:29 AM

## 2021-06-07 NOTE — Progress Notes (Signed)
Patient was transported via wheelchair by NT for discharge home; in no acute distress nor complaints of pain nor discomfort; room was checked and accounted for all her belongings; discharge instructions given to patient by RN and she verbalized understanding on the instructions given.

## 2021-06-07 NOTE — Progress Notes (Signed)
  Subjective: Patient stable.  Pain controlled.  Hemovac drain removed   Objective: Vital signs in last 24 hours: Temp:  [97.8 F (36.6 C)-98.5 F (36.9 C)] 98 F (36.7 C) (08/06 0730) Pulse Rate:  [70-86] 80 (08/06 0730) Resp:  [9-20] 19 (08/06 0730) BP: (120-155)/(75-94) 144/80 (08/06 0730) SpO2:  [94 %-100 %] 100 % (08/06 0730) FiO2 (%):  [21 %] 21 % (08/05 1001)  Intake/Output from previous day: 08/05 0701 - 08/06 0700 In: 2353 [P.O.:1200; I.V.:1153] Out: 70 [Drains:40; Blood:30] Intake/Output this shift: No intake/output data recorded.  Exam:  Dorsiflexion/Plantar flexion intact No cellulitis present  Labs: No results for input(s): HGB in the last 72 hours. No results for input(s): WBC, RBC, HCT, PLT in the last 72 hours. No results for input(s): NA, K, CL, CO2, BUN, CREATININE, GLUCOSE, CALCIUM in the last 72 hours. No results for input(s): LABPT, INR in the last 72 hours.  Assessment/Plan: Plan at this time is for discharge to home today.  She will mobilize with the nurses to be ambulating.  Upper extremity function intact.  Hemovac drain had minimal output.   Landry Dyke Eyal Greenhaw 06/07/2021, 7:43 AM

## 2021-06-09 ENCOUNTER — Encounter (HOSPITAL_COMMUNITY): Payer: Self-pay | Admitting: Orthopaedic Surgery

## 2021-06-09 ENCOUNTER — Telehealth: Payer: Self-pay

## 2021-06-09 ENCOUNTER — Encounter: Payer: Self-pay | Admitting: Orthopaedic Surgery

## 2021-06-09 NOTE — Telephone Encounter (Signed)
Transition Care Management Follow-up Telephone Call   Date of discharge and from where:[facility] on 06/07/2021 How have you been since you were released from the hospital? Doing better  Any questions or concerns? No questions/concerns reported.  Items Reviewed: Did the pt receive and understand the discharge instructions provided? have the instructions and have no questions.  Medications obtained and verified? She said that she have  the medication list  and the hospital staff reviewed them in detail prior to discharge. She said that she has all of the medications and they have no questions at this time Any new allergies since your discharge? None reported  Do you have support at home? Yes Other (ie: DME, Home Health, etc)     pt has a walker , additional cervical collar for shower was given, and a rased toilet seat    Functional Questionnaire: (I = Independent and D = Dependent) ADL's:  Independent.        Follow up appointments reviewed:   PCP Hospital f/u appt confirmed? Dr Wynetta Emery on 06/24/2021@ 1050.  Specialist Hospital f/u appt confirmed? scheduled at this time with Dr Lorin Mercy Are transportation arrangements needed? have transportation   If their condition worsens, is the pt aware to call  their PCP or go to the ED? Yes.Made pt aware if condition worsen or start experiencing rapid weight gain, chest pain, diff breathing, SOB, high fevers, or bleading to refer imediately to ED for further evaluation.  Was the patient provided with contact information for the PCP's office or ED? she has the phone number  Was the pt encouraged to call back with questions or concerns?yes

## 2021-06-09 NOTE — Discharge Summary (Signed)
Patient ID: Theresa Cantrell MRN: [id number] DOB/AGE: 11/08/1975 45 y.o.  Admit date: 06/06/2021 Discharge date: 06/07/2021  Admission Diagnoses:  Active Problems:   Spinal stenosis of cervical region   Protrusion of cervical intervertebral disc   Cervical spinal stenosis   Discharge Diagnoses:  Active Problems:   Spinal stenosis of cervical region   Protrusion of cervical intervertebral disc   Cervical spinal stenosis  status post Procedure(s): C4-5, C5-6 ANTERIOR CERVICAL DISCECTOMY FUSION, ALLOGRAFT, PLATE  Past Medical History:  Diagnosis Date   Anxiety    Arthritis    Bipolar 1 disorder (HCC)    Depression    GERD (gastroesophageal reflux disease)    Hepatitis C    Hypertension    PTSD (post-traumatic stress disorder)     Surgeries: Procedure(s): C4-5, C5-6 ANTERIOR CERVICAL DISCECTOMY FUSION, ALLOGRAFT, PLATE on [id number]   Consultants:   Discharged Condition: Improved  Hospital Course: Theresa Cantrell is an 45 y.o. female who was admitted 06/06/2021 for operative treatment of cervical stenosis/HNP. Patient failed conservative treatments (please see the history and physical for the specifics) and had severe unremitting pain that affects sleep, daily activities and work/hobbies. After pre-op clearance, the patient was taken to the operating room on 06/06/2021 and underwent  Procedure(s): C4-5, C5-6 ANTERIOR CERVICAL DISCECTOMY FUSION, ALLOGRAFT, PLATE.    Patient was given perioperative antibiotics:  Anti-infectives (From admission, onward)    Start     Dose/Rate Route Frequency Ordered Stop   06/06/21 0600  ceFAZolin (ANCEF) IVPB 2g/100 mL premix        2 g 200 mL/hr over 30 Minutes Intravenous On call to O.R. 06/06/21 [id number] 06/06/21 0724   06/06/21 0600  ceFAZolin (ANCEF) 2-4 GM/100ML-% IVPB       Note to Pharmacy: Gleason, Ginger   : cabinet override      06/06/21 0600 06/06/21 0755        Patient was given sequential compression devices and early ambulation  to prevent DVT.   Patient benefited maximally from hospital stay and there were no complications. At the time of discharge, the patient was urinating/moving their bowels without difficulty, tolerating a regular diet, pain is controlled with oral pain medications and they have been cleared by PT/OT.   Recent vital signs: No data found.   Recent laboratory studies: No results for input(s): WBC, HGB, HCT, PLT, NA, K, CL, CO2, BUN, CREATININE, GLUCOSE, INR, CALCIUM in the last 72 hours.  Invalid input(s): PT, 2   Discharge Medications:   Allergies as of 06/07/2021       Reactions   Lamotrigine Hives        Medication List     STOP taking these medications    diclofenac 75 MG EC tablet Commonly known as: VOLTAREN   HYDROcodone-acetaminophen 5-325 MG tablet Commonly known as: Norco   metroNIDAZOLE 500 MG tablet Commonly known as: FLAGYL   predniSONE 10 MG (21) Tbpk tablet Commonly known as: STERAPRED UNI-PAK 21 TAB   traMADol 50 MG tablet Commonly known as: ULTRAM       TAKE these medications    amLODipine 10 MG tablet Commonly known as: NORVASC Take 1 tablet (10 mg total) by mouth daily.   ARIPiprazole 10 MG tablet Commonly known as: ABILIFY Take 1 tablet (10 mg total) by mouth daily.   famotidine 20 MG tablet Commonly known as: PEPCID Take 1 tablet (20 mg total) by mouth 2 (two) times daily. To reduce stomach acid   FLUoxetine 20 MG capsule Commonly  known as: PROZAC Take 1 capsule (20 mg total) by mouth daily.   hydrOXYzine 10 MG tablet Commonly known as: ATARAX/VISTARIL Take 1 tablet (10 mg total) by mouth 3 (three) times daily as needed. What changed: reasons to take this   methocarbamol 500 MG tablet Commonly known as: Robaxin Take 1 tablet (500 mg total) by mouth 4 (four) times daily. What changed:  when to take this reasons to take this   nicotine 14 mg/24hr patch Commonly known as: NICODERM CQ - dosed in mg/24 hours Place 1 patch (14 mg  total) onto the skin daily.   oxyCODONE-acetaminophen 5-325 MG tablet Commonly known as: PERCOCET/ROXICET Take 1 tablet by mouth every 4 (four) hours as needed for severe pain.   prazosin 1 MG capsule Commonly known as: Minipress Take 1 capsule (1 mg total) by mouth at bedtime.   traZODone 50 MG tablet Commonly known as: DESYREL Take 1 tablet (50 mg total) by mouth at bedtime as needed for sleep.   valACYclovir 1000 MG tablet Commonly known as: VALTREX Take 1,000 mg by mouth daily.   Vitamin D 50 MCG (2000 UT) tablet Take 2,000 Units by mouth daily.        Diagnostic Studies: DG Cervical Spine 2 or 3 views  Result Date: 06/06/2021 CLINICAL DATA:  Surgery, elective Z41.9 (ICD-10-CM). Additional history provided by technologist: C4-5, C5-6 anterior cervical discectomy fusion, allograft, plate. Provided fluoroscopy time 23 seconds (2.87 mGy). EXAM: CERVICAL SPINE - 2-3 VIEW; DG C-ARM 1-60 MIN COMPARISON:  MRI of the cervical spine 02/01/2021. FINDINGS: AP and lateral view intraoperative fluoroscopic images of the cervical spine are submitted, 3 images total. On the provided images, ACDF hardware is present at the C4-C6 levels. Partially visualized ET tube. IMPRESSION: Three intraoperative fluoroscopic images of the cervical spine from C4-C6 ACDF. Electronically Signed   By: Kellie Simmering DO   On: 06/06/2021 10:45   DG C-Arm 1-60 Min  Result Date: 06/06/2021 CLINICAL DATA:  Surgery, elective Z41.9 (ICD-10-CM). Additional history provided by technologist: C4-5, C5-6 anterior cervical discectomy fusion, allograft, plate. Provided fluoroscopy time 23 seconds (2.87 mGy). EXAM: CERVICAL SPINE - 2-3 VIEW; DG C-ARM 1-60 MIN COMPARISON:  MRI of the cervical spine 02/01/2021. FINDINGS: AP and lateral view intraoperative fluoroscopic images of the cervical spine are submitted, 3 images total. On the provided images, ACDF hardware is present at the C4-C6 levels. Partially visualized ET tube. IMPRESSION:  Three intraoperative fluoroscopic images of the cervical spine from C4-C6 ACDF. Electronically Signed   By: Kellie Simmering DO   On: 06/06/2021 10:45    Discharge Instructions     Call MD / Call 911   Complete by: As directed    If you experience chest pain or shortness of breath, CALL 911 and be transported to the hospital emergency room.  If you develope a fever above 101 F, pus (white drainage) or increased drainage or redness at the wound, or calf pain, call your surgeon's office.   Constipation Prevention   Complete by: As directed    Drink plenty of fluids.  Prune juice may be helpful.  You may use a stool softener, such as Colace (over the counter) 100 mg twice a day.  Use MiraLax (over the counter) for constipation as needed.   Diet - low sodium heart healthy   Complete by: As directed    Incentive spirometry RT   Complete by: As directed    Increase activity slowly as tolerated   Complete by: As directed  Post-operative opioid taper instructions:   Complete by: As directed    POST-OPERATIVE OPIOID TAPER INSTRUCTIONS: It is important to wean off of your opioid medication as soon as possible. If you do not need pain medication after your surgery it is ok to stop day one. Opioids include: Codeine, Hydrocodone(Norco, Vicodin), Oxycodone(Percocet, oxycontin) and hydromorphone amongst others.  Long term and even short term use of opiods can cause: Increased pain response Dependence Constipation Depression Respiratory depression And more.  Withdrawal symptoms can include Flu like symptoms Nausea, vomiting And more Techniques to manage these symptoms Hydrate well Eat regular healthy meals Stay active Use relaxation techniques(deep breathing, meditating, yoga) Do Not substitute Alcohol to help with tapering If you have been on opioids for less than two weeks and do not have pain than it is ok to stop all together.  Plan to wean off of opioids This plan should start within one  week post op of your joint replacement. Maintain the same interval or time between taking each dose and first decrease the dose.  Cut the total daily intake of opioids by one tablet each day Next start to increase the time between doses. The last dose that should be eliminated is the evening dose.           Follow-up Information     Marybelle Killings, MD. Schedule an appointment as soon as possible for a visit today.   Specialty: Orthopedic Surgery Why: need return office visit one week postop Contact information: [address] [place] [address] [phone]                 Discharge Plan:  discharge to home  Disposition:     Signed: Benjiman Core  06/09/2021, 3:34 PM

## 2021-06-10 ENCOUNTER — Encounter (HOSPITAL_COMMUNITY): Payer: Self-pay | Admitting: Orthopaedic Surgery

## 2021-06-17 ENCOUNTER — Other Ambulatory Visit: Payer: Self-pay

## 2021-06-17 ENCOUNTER — Encounter: Payer: Self-pay | Admitting: Orthopaedic Surgery

## 2021-06-17 ENCOUNTER — Ambulatory Visit (INDEPENDENT_AMBULATORY_CARE_PROVIDER_SITE_OTHER): Payer: PRIVATE HEALTH INSURANCE | Admitting: Orthopaedic Surgery

## 2021-06-17 ENCOUNTER — Ambulatory Visit (INDEPENDENT_AMBULATORY_CARE_PROVIDER_SITE_OTHER): Payer: PRIVATE HEALTH INSURANCE

## 2021-06-17 VITALS — BP 147/88 | HR 79 | Wt 188.0 lb

## 2021-06-17 DIAGNOSIS — M542 Cervicalgia: Secondary | ICD-10-CM

## 2021-06-17 DIAGNOSIS — Z9889 Other specified postprocedural states: Secondary | ICD-10-CM | POA: Diagnosis not present

## 2021-06-17 DIAGNOSIS — Z981 Arthrodesis status: Secondary | ICD-10-CM

## 2021-06-17 NOTE — Progress Notes (Deleted)
Post-Op Visit Note   Patient: Theresa Cantrell           Date of Birth: 02-Apr-1976           MRN: [id number] Visit Date: 06/17/2021 PCP: Ladell Pier, MD   Assessment & Plan:  Chief Complaint:  Chief Complaint  Patient presents with   Neck - Routine Post Op   Visit Diagnoses:  1. Post-operative state   2. S/P cervical spinal fusion     Plan: ***  Follow-Up Instructions: Return in about 5 weeks (around 07/22/2021).   Orders:  Orders Placed This Encounter  Procedures   XR Cervical Spine 2 or 3 views   No orders of the defined types were placed in this encounter.   Imaging: No results found.  PMFS History: Patient Active Problem List   Diagnosis Date Noted   S/P cervical spinal fusion 06/17/2021   Spinal stenosis of cervical region 04/02/2021   Protrusion of cervical intervertebral disc 04/02/2021   COVID-19 vaccine series not completed 12/20/2020   Influenza vaccine refused 11/07/2020   Essential hypertension 11/07/2020   Obesity (BMI 30.0-34.9) 11/07/2020   Tobacco dependence 11/07/2020   Gastroesophageal reflux disease without esophagitis 11/07/2020   History of abnormal cervical Pap smear 11/07/2020   History of bipolar disorder 11/07/2020   Bipolar 2 disorder, major depressive episode ([place]) 06/12/2020   PTSD (post-traumatic stress disorder) 06/12/2020   Chronic pain of left knee 10/05/2019   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   Neuropathy 02/07/2014   Hot flashes 02/07/2014   Weight loss 02/07/2014   Anxiety    Past Medical History:  Diagnosis Date   Anxiety    Arthritis    Bipolar 1 disorder (HCC)    Depression    GERD (gastroesophageal reflux disease)    Hepatitis C    Hypertension    PTSD (post-traumatic stress disorder)     Family History  Problem Relation Age of Onset   Hypertension Mother    Cancer Mother        breast   Anxiety disorder Mother    Hypertension Father    Schizophrenia Father     Bipolar disorder Father    Anxiety disorder Father    Colon cancer Neg Hx    Colon polyps Neg Hx    Esophageal cancer Neg Hx    Rectal cancer Neg Hx    Stomach cancer Neg Hx     Past Surgical History:  Procedure Laterality Date   ABDOMINAL HYSTERECTOMY     ANTERIOR CERVICAL DECOMP/DISCECTOMY FUSION N/A 06/06/2021   Procedure: C4-5, C5-6 ANTERIOR CERVICAL DISCECTOMY FUSION, ALLOGRAFT, PLATE;  Surgeon: Marybelle Killings, MD;  Location: [facility];  Service: Orthopedics;  Laterality: N/A;   CHOLECYSTECTOMY     DILATION AND CURETTAGE OF UTERUS     FOOT SURGERY Bilateral    two pins in each foot   JOINT REPLACEMENT Left 2018   partial knee replacement   KNEE SURGERY Left 2018   partial knee replacement   NECK SURGERY  06/06/2021   C4-5, C5-6 ANTERIOR CERVICAL DISCECTOMY FUSION, ALLOGRAFT, PLATE   Social History   Occupational History   Not on file  Tobacco Use   Smoking status: Some Days    Packs/day: 0.25    Types: Cigarettes   Smokeless tobacco: Never  Vaping Use   Vaping Use: Never used  Substance and Sexual Activity   Alcohol use: Yes    Alcohol/week: 2.0 standard drinks  Types: 1 Glasses of wine, 1 Cans of beer per week   Drug use: No   Sexual activity: Not Currently    Birth control/protection: Surgical

## 2021-06-19 ENCOUNTER — Encounter: Payer: Self-pay | Admitting: Physical Medicine & Rehabilitation

## 2021-06-19 ENCOUNTER — Encounter: Payer: PRIVATE HEALTH INSURANCE | Admitting: Physical Medicine & Rehabilitation

## 2021-06-19 ENCOUNTER — Other Ambulatory Visit: Payer: Self-pay

## 2021-06-19 VITALS — BP 131/87 | HR 83 | Temp 98.4°F | Ht 66.0 in | Wt 193.0 lb

## 2021-06-19 DIAGNOSIS — Z981 Arthrodesis status: Secondary | ICD-10-CM

## 2021-06-19 NOTE — Progress Notes (Signed)
Post-Op Visit Note   Patient: Theresa Cantrell           Date of Birth: 01-Nov-1976           MRN: [id number] Visit Date: 06/17/2021 PCP: Ladell Pier, MD   Assessment & Plan: Postop two-level cervical fusion C4-5 C5-6 on 06/06/2021.  Patient still having some aching pain and soreness.  Ambulatory with a rolling walker.  On oxycodone.  Patient thinks she might have some increased weakness in her right arm since surgery.  Steri-Strips changed.  Collar is on.  Upper extremity reflexes are intact right and left and symmetrical.  Biceps triceps is strong good grip strength.  X-rays today show good position of two-level cervical fusion C4-5, C5-6.  Chief Complaint:  Chief Complaint  Patient presents with   Neck - Routine Post Op   Visit Diagnoses:  1. Post-operative state   2. S/P cervical spinal fusion     Plan: Continue walking program avoid lifting.  Continue collar.  Extra collar covers given.  Return in 5 weeks for lateral flexion-extension x-rays.  Follow-Up Instructions: Return in about 5 weeks (around 07/22/2021).   Orders:  Orders Placed This Encounter  Procedures   XR Cervical Spine 2 or 3 views   No orders of the defined types were placed in this encounter.   Imaging: No results found.  PMFS History: Patient Active Problem List   Diagnosis Date Noted   S/P cervical spinal fusion 06/17/2021   Spinal stenosis of cervical region 04/02/2021   Protrusion of cervical intervertebral disc 04/02/2021   COVID-19 vaccine series not completed 12/20/2020   Influenza vaccine refused 11/07/2020   Essential hypertension 11/07/2020   Obesity (BMI 30.0-34.9) 11/07/2020   Tobacco dependence 11/07/2020   Gastroesophageal reflux disease without esophagitis 11/07/2020   History of abnormal cervical Pap smear 11/07/2020   History of bipolar disorder 11/07/2020   Bipolar 2 disorder, major depressive episode ([place]) 06/12/2020   PTSD (post-traumatic stress disorder) 06/12/2020    Chronic pain of left knee 10/05/2019   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   Neuropathy 02/07/2014   Hot flashes 02/07/2014   Weight loss 02/07/2014   Anxiety    Past Medical History:  Diagnosis Date   Anxiety    Arthritis    Bipolar 1 disorder (HCC)    Depression    GERD (gastroesophageal reflux disease)    Hepatitis C    Hypertension    PTSD (post-traumatic stress disorder)     Family History  Problem Relation Age of Onset   Hypertension Mother    Cancer Mother        breast   Anxiety disorder Mother    Hypertension Father    Schizophrenia Father    Bipolar disorder Father    Anxiety disorder Father    Colon cancer Neg Hx    Colon polyps Neg Hx    Esophageal cancer Neg Hx    Rectal cancer Neg Hx    Stomach cancer Neg Hx     Past Surgical History:  Procedure Laterality Date   ABDOMINAL HYSTERECTOMY     ANTERIOR CERVICAL DECOMP/DISCECTOMY FUSION N/A 06/06/2021   Procedure: C4-5, C5-6 ANTERIOR CERVICAL DISCECTOMY FUSION, ALLOGRAFT, PLATE;  Surgeon: Marybelle Killings, MD;  Location: [facility];  Service: Orthopedics;  Laterality: N/A;   CHOLECYSTECTOMY     DILATION AND CURETTAGE OF UTERUS     FOOT SURGERY Bilateral    two pins in each foot   JOINT  REPLACEMENT Left 2018   partial knee replacement   KNEE SURGERY Left 2018   partial knee replacement   NECK SURGERY  06/06/2021   C4-5, C5-6 ANTERIOR CERVICAL DISCECTOMY FUSION, ALLOGRAFT, PLATE   Social History   Occupational History   Not on file  Tobacco Use   Smoking status: Some Days    Packs/day: 0.25    Types: Cigarettes   Smokeless tobacco: Never  Vaping Use   Vaping Use: Never used  Substance and Sexual Activity   Alcohol use: Yes    Alcohol/week: 2.0 standard drinks    Types: 1 Glasses of wine, 1 Cans of beer per week   Drug use: No   Sexual activity: Not Currently    Birth control/protection: Surgical

## 2021-06-19 NOTE — Progress Notes (Signed)
Subjective:    Patient ID: Theresa Cantrell, female    DOB: 1976-08-16, 45 y.o.   MRN: [id number]  HPI  Patient was referred a couple months ago for physical medicine rehab evaluation of chronic neck pain, however in the meantime she has undergone C4-5 and C5-6 ACDF on 06/06/2021.  She followed up with Dr. Lorin Mercy 2 days ago.  At this point the patient is still recovering from her surgery and will likely have further improvements.  We will defer consultation.  If the patient has continued severe neck problems after adequate recovery, the patient can be evaluated. Pain Inventory Average Pain 8 Pain Right Now 10 My pain is intermittent, sharp, burning, stabbing, tingling, and aching  In the last 24 hours, has pain interfered with the following? General activity 10 Relation with others 9 Enjoyment of life 10 What TIME of day is your pain at its worst? night Sleep (in general) Fair  Pain is worse with: walking, bending, sitting, inactivity, standing, and some activites Pain improves with: rest, heat/ice, pacing activities, medication, and injections Relief from Meds:  fair  use a cane use a walker how many minutes can you walk? 20-30 mins ability to climb steps?  yes do you drive?  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  yes  weakness numbness tingling trouble walking depression anxiety  Any changes since last visit?  yes [facility] - neck xray  Any changes since last visit?  no, New Patient    Family History  Problem Relation Age of Onset   Hypertension Mother    Cancer Mother        breast   Anxiety disorder Mother    Hypertension Father    Schizophrenia Father    Bipolar disorder Father    Anxiety disorder Father    Colon cancer Neg Hx    Colon polyps Neg Hx    Esophageal cancer Neg Hx    Rectal cancer Neg Hx    Stomach  cancer Neg Hx    Social History   Socioeconomic History   Marital status: Single    Spouse name: Not on file   Number of children: 1   Years of education: Not on file   Highest education level: Some college, no degree  Occupational History   Not on file  Tobacco Use   Smoking status: Some Days    Packs/day: 0.25    Types: Cigarettes   Smokeless tobacco: Never  Vaping Use   Vaping Use: Never used  Substance and Sexual Activity   Alcohol use: Yes    Alcohol/week: 2.0 standard drinks    Types: 1 Glasses of wine, 1 Cans of beer per week   Drug use: No   Sexual activity: Not Currently    Birth control/protection: Surgical  Other Topics Concern   Not on file  Social History Narrative   Not on file   Social Determinants of Health   Financial Resource Strain: Not on file  Food Insecurity: Not on file  Transportation Needs: Not on file  Physical Activity: Not on file  Stress: Not on file  Social Connections: Not on file   Past Surgical History:  Procedure Laterality Date   ABDOMINAL HYSTERECTOMY     ANTERIOR CERVICAL DECOMP/DISCECTOMY FUSION N/A 06/06/2021   Procedure: C4-5, C5-6 ANTERIOR CERVICAL DISCECTOMY FUSION, ALLOGRAFT, PLATE;  Surgeon: Marybelle Killings, MD;  Location: [facility];  Service: Orthopedics;  Laterality: N/A;   CHOLECYSTECTOMY     DILATION AND CURETTAGE OF UTERUS     FOOT SURGERY Bilateral    two pins in each foot   JOINT REPLACEMENT Left 2018   partial knee replacement   KNEE SURGERY Left 2018   partial knee replacement   NECK SURGERY  06/06/2021   C4-5, C5-6 ANTERIOR CERVICAL DISCECTOMY FUSION, ALLOGRAFT, PLATE   Past Medical History:  Diagnosis Date   Anxiety    Arthritis    Bipolar 1 disorder (HCC)    Depression    GERD (gastroesophageal reflux disease)    Hepatitis C    Hypertension    PTSD (post-traumatic stress disorder)    BP 131/87   Pulse 83   Temp 98.4 F (36.9 C)   Ht '5\' 6"'$  (1.676 m)   Wt 193 lb (87.5 kg)   SpO2 98%   BMI 31.15  kg/m   Opioid Risk Score:   Fall Risk Score:  `1  Depression screen PHQ 2/9  Depression screen [facility] 2/9 06/19/2021 12/20/2020 11/07/2020 09/05/2020  Decreased Interest '1 1 2 '$ 0  Down, Depressed, Hopeless '3 2 2 '$ 0  PHQ - 2 Score '4 3 4 '$ 0  Altered sleeping '2 2 2 '$ -  Tired, decreased energy 1 0 1 -  Change in appetite 0 0 0 -  Feeling bad or failure about yourself  2 0 0 -  Trouble concentrating 0 0 1 -  Moving slowly or fidgety/restless 1 0 0 -  Suicidal thoughts 0 0 0 -  PHQ-9 Score '10 5 8 '$ -  Some encounter information is confidential and restricted. Go to Review Flowsheets activity to see all data.    Review of Systems  Musculoskeletal:  Positive for back pain, gait problem and neck pain.       Pain in joints, elbows, wrists, hands, knees, shouders  Neurological:  Positive for weakness and numbness.       Tingling  Psychiatric/Behavioral:         Anxiety, depression  All other systems reviewed and are negative.     Objective:   Physical Exam        Assessment & Plan:

## 2021-06-20 ENCOUNTER — Encounter: Payer: Self-pay | Admitting: Orthopaedic Surgery

## 2021-06-20 NOTE — Telephone Encounter (Signed)
I called patient. Per Dr. Lorin Mercy, ok for alcoholic beverages as long as she is not on pain medication. She states that she was eating a fig newton and it had aluminum foil or something in the cookie and it cut her gum. It is aggravating her more than anything and that was why she had questioned the dentist. Per Dr. Lorin Mercy, ok for dentist if she is concerned for infection/abscess, but would hold off if only for something like cleaning due to the position that they will put her neck in and being so early out from post op. Patient expressed understanding. Advised to be careful when going up and down stairs.

## 2021-06-23 ENCOUNTER — Encounter: Payer: Self-pay | Admitting: Orthopaedic Surgery

## 2021-06-24 ENCOUNTER — Encounter: Payer: Self-pay | Admitting: Internal Medicine

## 2021-06-24 ENCOUNTER — Other Ambulatory Visit: Payer: Self-pay

## 2021-06-24 ENCOUNTER — Ambulatory Visit: Payer: PRIVATE HEALTH INSURANCE | Attending: Internal Medicine | Admitting: Internal Medicine

## 2021-06-24 VITALS — BP 130/60 | HR 95 | Resp 16 | Ht 65.0 in | Wt 194.6 lb

## 2021-06-24 DIAGNOSIS — Z6832 Body mass index (BMI) 32.0-32.9, adult: Secondary | ICD-10-CM

## 2021-06-24 DIAGNOSIS — E669 Obesity, unspecified: Secondary | ICD-10-CM | POA: Diagnosis not present

## 2021-06-24 DIAGNOSIS — Z87891 Personal history of nicotine dependence: Secondary | ICD-10-CM | POA: Diagnosis not present

## 2021-06-24 DIAGNOSIS — I10 Essential (primary) hypertension: Secondary | ICD-10-CM | POA: Diagnosis not present

## 2021-06-24 NOTE — Patient Instructions (Signed)

## 2021-06-24 NOTE — Progress Notes (Signed)
Patient ID: Theresa Cantrell, female    DOB: [birth year]  MRN: [id number]  CC: Chronic disease management and hospital follow-up   Subjective: Theresa Cantrell is a 45 y.o. female who presents for chronic ds management and hosp f/u Her concerns today include:  Patient with history of HTN, tob dep, GERD, obesity,  PTSD, Bipolar 2, tob dep, CTS RT hand, family history of breast cancer in her mother.  Since last visit with me, patient was hospitalized 8/5-04/2021 surgery on her neck by Dr. Lorin Mercy.  She underwent anterior cervical discectomy fusion and allograft at C4-5 and C5-6 levels.  She has seen Dr. Lorin Mercy for hospital follow-up already.  Reports she is doing okay except she is having some problems with the left arm.  She has a follow-up appointment with him on the 23rd of next month.  HTN: Reports compliance with Norvasc.  She has a home blood pressure monitoring device but has not checked it in the past several weeks.  She limits salt in the foods.  No chest pains or shortness of breath at this time.  No headaches or dizziness.  No swelling in the legs.  Tobacco dependence: She quit smoking a few days prior to her surgery and states that she does not plan to restart.  However she reports weight gain since quitting.  She admits to snacking on a lot of junk foods including ice cream, cakes, cookies and nutty bar.  She has been walking for about 1 mile every day.  She had a question about hepatitis C being on her records under past history.  Patient states that she was sent to the liver clinic back in 2015 and was told that she did not have hepatitis C.  I also note that the gynecologist Dr. Kennon Rounds had done hepatitis C screening test on her on 04/01/2018 and it was negative   Patient Active Problem List   Diagnosis Date Noted   S/P cervical spinal fusion 06/17/2021   Spinal stenosis of cervical region 04/02/2021   Protrusion of cervical intervertebral disc 04/02/2021   COVID-19 vaccine series not  completed 12/20/2020   Influenza vaccine refused 11/07/2020   Essential hypertension 11/07/2020   Obesity (BMI 30.0-34.9) 11/07/2020   Tobacco dependence 11/07/2020   Gastroesophageal reflux disease without esophagitis 11/07/2020   History of abnormal cervical Pap smear 11/07/2020   History of bipolar disorder 11/07/2020   Bipolar 2 disorder, major depressive episode ([place]) 06/12/2020   PTSD (post-traumatic stress disorder) 06/12/2020   Chronic pain of left knee 10/05/2019   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   Neuropathy 02/07/2014   Hot flashes 02/07/2014   Weight loss 02/07/2014   Anxiety      Current Outpatient Medications on File Prior to Visit  Medication Sig Dispense Refill   amLODipine (NORVASC) 10 MG tablet Take 1 tablet (10 mg total) by mouth daily. 90 tablet 1   ARIPiprazole (ABILIFY) 10 MG tablet Take 1 tablet (10 mg total) by mouth daily. 30 tablet 2   Cholecalciferol (VITAMIN D) 50 MCG (2000 UT) tablet Take 2,000 Units by mouth daily.     famotidine (PEPCID) 20 MG tablet Take 1 tablet (20 mg total) by mouth 2 (two) times daily. To reduce stomach acid 60 tablet 2   FLUoxetine (PROZAC) 20 MG capsule Take 1 capsule (20 mg total) by mouth daily. 30 capsule 2   hydrOXYzine (ATARAX/VISTARIL) 10 MG tablet Take 1 tablet (10 mg total) by mouth 3 (three) times daily  as needed. 90 tablet 2   methocarbamol (ROBAXIN) 500 MG tablet Take 1 tablet (500 mg total) by mouth 4 (four) times daily. 60 tablet 0   oxyCODONE-acetaminophen (PERCOCET/ROXICET) 5-325 MG tablet Take 1 tablet by mouth every 4 (four) hours as needed for severe pain. 50 tablet 0   prazosin (MINIPRESS) 1 MG capsule Take 1 capsule (1 mg total) by mouth at bedtime. 30 capsule 2   traMADol (ULTRAM) 50 MG tablet Take 50 mg by mouth 3 (three) times daily as needed.     traZODone (DESYREL) 50 MG tablet Take 1 tablet (50 mg total) by mouth at bedtime as needed for sleep. 30 tablet 2   valACYclovir  (VALTREX) 1000 MG tablet Take 1,000 mg by mouth daily.     No current facility-administered medications on file prior to visit.    Allergies  Allergen Reactions   Lamotrigine Hives    Social History   Socioeconomic History   Marital status: Single    Spouse name: Not on file   Number of children: 1   Years of education: Not on file   Highest education level: Some college, no degree  Occupational History   Not on file  Tobacco Use   Smoking status: Former    Packs/day: 0.25    Types: Cigarettes   Smokeless tobacco: Never  Vaping Use   Vaping Use: Never used  Substance and Sexual Activity   Alcohol use: Yes    Alcohol/week: 2.0 standard drinks    Types: 1 Glasses of wine, 1 Cans of beer per week   Drug use: No   Sexual activity: Not Currently    Birth control/protection: Surgical  Other Topics Concern   Not on file  Social History Narrative   Not on file   Social Determinants of Health   Financial Resource Strain: Not on file  Food Insecurity: Not on file  Transportation Needs: Not on file  Physical Activity: Not on file  Stress: Not on file  Social Connections: Not on file  Intimate Partner Violence: Not on file    Family History  Problem Relation Age of Onset   Hypertension Mother    Cancer Mother        breast   Anxiety disorder Mother    Hypertension Father    Schizophrenia Father    Bipolar disorder Father    Anxiety disorder Father    Colon cancer Neg Hx    Colon polyps Neg Hx    Esophageal cancer Neg Hx    Rectal cancer Neg Hx    Stomach cancer Neg Hx     Past Surgical History:  Procedure Laterality Date   ABDOMINAL HYSTERECTOMY     ANTERIOR CERVICAL DECOMP/DISCECTOMY FUSION N/A 06/06/2021   Procedure: C4-5, C5-6 ANTERIOR CERVICAL DISCECTOMY FUSION, ALLOGRAFT, PLATE;  Surgeon: Marybelle Killings, MD;  Location: [facility];  Service: Orthopedics;  Laterality: N/A;   CHOLECYSTECTOMY     DILATION AND CURETTAGE OF UTERUS     FOOT SURGERY Bilateral     two pins in each foot   JOINT REPLACEMENT Left 2018   partial knee replacement   KNEE SURGERY Left 2018   partial knee replacement   NECK SURGERY  06/06/2021   C4-5, C5-6 ANTERIOR CERVICAL DISCECTOMY FUSION, ALLOGRAFT, PLATE    ROS: Review of Systems Negative except as stated above  PHYSICAL EXAM: BP 130/60   Pulse 95   Resp 16   Ht '5\' 5"'$  (1.651 m)   Wt 194 lb 9.6  oz (88.3 kg)   SpO2 99%   BMI 32.38 kg/m   Wt Readings from Last 3 Encounters:  06/24/21 194 lb 9.6 oz (88.3 kg)  06/19/21 193 lb (87.5 kg)  06/17/21 188 lb (85.3 kg)    Physical Exam  General appearance - alert, well appearing, and in no distress Mental status - normal mood, behavior, speech, dress, motor activity, and thought processes Neck: Patient is wearing a soft collar around her neck. Chest - clear to auscultation, no wheezes, rales or rhonchi, symmetric air entry Heart - normal rate, regular rhythm, normal S1, S2, no murmurs, rubs, clicks or gallops Extremities - peripheral pulses normal, no pedal edema, no clubbing or cyanosis   CMP Latest Ref Rng & Units 05/28/2021 11/07/2020 06/11/2019  Glucose 70 - 99 mg/dL 64(L) 100(H) 97  BUN 6 - 20 mg/dL 24(H) 17 16  Creatinine 0.44 - 1.00 mg/dL 1.03(H) 1.00 0.93  Sodium 135 - 145 mmol/L 136 147(H) 139  Potassium 3.5 - 5.1 mmol/L 3.6 4.2 3.6  Chloride 98 - 111 mmol/L 104 107(H) 104  CO2 22 - 32 mmol/L '28 28 24  '$ Calcium 8.9 - 10.3 mg/dL 9.3 9.9 9.2  Total Protein 6.5 - 8.1 g/dL 6.6 6.9 -  Total Bilirubin 0.3 - 1.2 mg/dL 0.3 <0.2 -  Alkaline Phos 38 - 126 U/L 87 112 -  AST 15 - 41 U/L 21 14 -  ALT 0 - 44 U/L 10 14 -   Lipid Panel     Component Value Date/Time   CHOL 165 11/07/2020 1610   TRIG 84 11/07/2020 1610   HDL 63 11/07/2020 1610   CHOLHDL 2.6 11/07/2020 1610   LDLCALC 86 11/07/2020 1610    CBC    Component Value Date/Time   WBC 6.9 05/28/2021 1530   RBC 4.39 05/28/2021 1530   HGB 13.2 05/28/2021 1530   HGB 14.2 11/07/2020 1610   HCT 40.8  05/28/2021 1530   HCT 42.3 11/07/2020 1610   PLT 240 05/28/2021 1530   PLT 250 11/07/2020 1610   MCV 92.9 05/28/2021 1530   MCV 91 11/07/2020 1610   MCH 30.1 05/28/2021 1530   MCHC 32.4 05/28/2021 1530   RDW 13.3 05/28/2021 1530   RDW 12.5 11/07/2020 1610    ASSESSMENT AND PLAN:  1. Essential hypertension At goal.  Continue amlodipine and low-salt diet.  2. Former smoker Commended her on quitting.  Encouraged to remain tobacco free.  Recommend chewing nicotine gum to see if it would help slow down her snacking.  3. Obesity (BMI 30.0-34.9) Dietary counseling given. Encouraged her to avoid purchasing the sugary snacks and purchase fruits instead. Encouraged to eliminate sugary drinks from the diet, eat more lean meat and cut back on white carbohydrates.  Continue all walks unless her orthopedic surgeon advised against it.   Patient was given the opportunity to ask questions.  Patient verbalized understanding of the plan and was able to repeat key elements of the plan.   No orders of the defined types were placed in this encounter.    Requested Prescriptions    No prescriptions requested or ordered in this encounter    Return in about 4 months (around 10/24/2021).  Karle Plumber, MD, FACP

## 2021-06-24 NOTE — Telephone Encounter (Signed)
noted 

## 2021-06-25 ENCOUNTER — Ambulatory Visit (HOSPITAL_COMMUNITY): Payer: No Payment, Other | Admitting: Licensed Clinical Social Worker

## 2021-06-26 ENCOUNTER — Encounter: Payer: PRIVATE HEALTH INSURANCE | Admitting: Family Medicine

## 2021-06-30 ENCOUNTER — Other Ambulatory Visit: Payer: Self-pay

## 2021-06-30 ENCOUNTER — Telehealth (INDEPENDENT_AMBULATORY_CARE_PROVIDER_SITE_OTHER): Payer: No Payment, Other | Admitting: Psychiatry

## 2021-06-30 ENCOUNTER — Encounter (HOSPITAL_COMMUNITY): Payer: Self-pay | Admitting: Psychiatry

## 2021-06-30 DIAGNOSIS — F4310 Post-traumatic stress disorder, unspecified: Secondary | ICD-10-CM | POA: Diagnosis not present

## 2021-06-30 DIAGNOSIS — F3181 Bipolar II disorder: Secondary | ICD-10-CM

## 2021-06-30 DIAGNOSIS — F419 Anxiety disorder, unspecified: Secondary | ICD-10-CM | POA: Diagnosis not present

## 2021-06-30 MED ORDER — FLUOXETINE HCL 20 MG PO CAPS: 20.0000 mg | ORAL_CAPSULE | Freq: Every day | ORAL | 3 refills | Status: DC

## 2021-06-30 MED ORDER — HYDROXYZINE HCL 10 MG PO TABS: 10.0000 mg | ORAL_TABLET | Freq: Three times a day (TID) | ORAL | 3 refills | Status: DC | PRN

## 2021-06-30 MED ORDER — PRAZOSIN HCL 1 MG PO CAPS: 1.0000 mg | ORAL_CAPSULE | Freq: Every day | ORAL | 2 refills | Status: DC

## 2021-06-30 MED ORDER — TRAZODONE HCL 50 MG PO TABS: 50.0000 mg | ORAL_TABLET | Freq: Every evening | ORAL | 3 refills | Status: DC | PRN

## 2021-06-30 MED ORDER — ARIPIPRAZOLE 10 MG PO TABS: 10.0000 mg | ORAL_TABLET | Freq: Every day | ORAL | 3 refills | Status: DC

## 2021-06-30 NOTE — Progress Notes (Signed)
BH MD/PA/NP OP Progress Note Virtual Visit via Telephone Note  I connected with Theresa Cantrell on 06/30/21 at  1:30 PM EDT by telephone and verified that I am speaking with the correct person using two identifiers.  Location: Patient: home Provider: Clinic   I discussed the limitations, risks, security and privacy concerns of performing an evaluation and management service by telephone and the availability of in person appointments. I also discussed with the patient that there may be a patient responsible charge related to this service. The patient expressed understanding and agreed to proceed.   I provided 30 minutes of non-face-to-face time during this encounter.       06/30/2021 1:43 PM Theresa Cantrell  MRN:  [id number]  Chief Complaint: "I had my surgery and I am slowly healing"  HPI: 45 year old female seen today for follow up psychiatric evaluation.  She has a psychiatric history of  Bipolar, depression, anxiety, and PTSD.  She is currently being managed on Abilify 10 mg daily, prazosin 1 mg nightly, trazodone 25 mg to 50 mg as needed sleep, hydroxyzine 10 mg three times daily, and Prozac 20 mg daily.  Patient notes that her medications are effective in managing her psychiatric conditions.  Today she is well-groomed, pleasant, cooperative, engaged in conversation, and engaged in conversation.  She informed Probation officer that she had surgery on her neck earlier this month and notes that she is healing well.  She informed Probation officer that since her surgery she has stopped smoking cigarettes as she was instructed to by her MD. for proper wound healing.  She notes that she craves cigarettes occasionally however reports that she is managing well without it.  Patient notes at times she has difficulty sleeping because she wears a neck brace.  She informed Probation officer that she sleeps approximately 4 to 5 hours nightly in a recliner.  She informed Probation officer that her mood is stable and reports that her anxiety and  depression are well managed.  A GAD-7 was conducted on 06/24/2021 and patient scored a 10, at her last visit she scored a 6.  A PHQ-9 was also conducted and patient scored a 4, at her last visit she scored a 4.  Today she denies SI/HI/VAH, mania, or paranoia.   Patient informed Probation officer that she has been trying to walk more in her neighborhood.  She notes at times she has neck pain and knee pain but reports overall she is doing well and notes that her pain is managed on oxycodone.  Patient informed Probation officer that she is concerned about returning to work.  She notes that the contract with [employer] has expired.  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  Overall patient notes that she is doing well.  No medication changes made today. Patient agreeable to continue all medications as prescribed. No other concerns noted at this time.    Visit Diagnosis:    ICD-10-CM   1. Bipolar 2 disorder, major depressive episode (HCC)  F31.81 FLUoxetine (PROZAC) 20 MG capsule    traZODone (DESYREL) 50 MG tablet    2. Anxiety  F41.9 hydrOXYzine (ATARAX/VISTARIL) 10 MG tablet    3. PTSD (post-traumatic stress disorder)  F43.10 prazosin (MINIPRESS) 1 MG capsule      Past Psychiatric History:  Bipolar, depression, anxiety, and PTSD.  Past Medical History:  Past Medical History:  Diagnosis Date   Anxiety    Arthritis  Bipolar 1 disorder (HCC)    Depression    GERD (gastroesophageal reflux disease)    Hypertension    PTSD (post-traumatic stress disorder)     Past Surgical History:  Procedure Laterality Date   ABDOMINAL HYSTERECTOMY     ANTERIOR CERVICAL DECOMP/DISCECTOMY FUSION N/A 06/06/2021   Procedure: C4-5, C5-6 ANTERIOR CERVICAL DISCECTOMY FUSION, ALLOGRAFT, PLATE;  Surgeon: Marybelle Killings, MD;  Location: [facility];  Service: Orthopedics;  Laterality: N/A;   CHOLECYSTECTOMY     DILATION AND CURETTAGE OF UTERUS     FOOT SURGERY Bilateral     two pins in each foot   JOINT REPLACEMENT Left 2018   partial knee replacement   KNEE SURGERY Left 2018   partial knee replacement   NECK SURGERY  06/06/2021   C4-5, C5-6 ANTERIOR CERVICAL DISCECTOMY FUSION, ALLOGRAFT, PLATE    Family Psychiatric History: Mother Bipolar and depression, father schizophrenia and depression, bipolar.  Family History:  Family History  Problem Relation Age of Onset   Hypertension Mother    Cancer Mother        breast   Anxiety disorder Mother    Hypertension Father    Schizophrenia Father    Bipolar disorder Father    Anxiety disorder Father    Colon cancer Neg Hx    Colon polyps Neg Hx    Esophageal cancer Neg Hx    Rectal cancer Neg Hx    Stomach cancer Neg Hx     Social History:  Social History   Socioeconomic History   Marital status: Single    Spouse name: Not on file   Number of children: 1   Years of education: Not on file   Highest education level: Some college, no degree  Occupational History   Not on file  Tobacco Use   Smoking status: Former    Packs/day: 0.25    Types: Cigarettes   Smokeless tobacco: Never  Vaping Use   Vaping Use: Never used  Substance and Sexual Activity   Alcohol use: Yes    Alcohol/week: 2.0 standard drinks    Types: 1 Glasses of wine, 1 Cans of beer per week   Drug use: No   Sexual activity: Not Currently    Birth control/protection: Surgical  Other Topics Concern   Not on file  Social History Narrative   Not on file   Social Determinants of Health   Financial Resource Strain: Not on file  Food Insecurity: Not on file  Transportation Needs: Not on file  Physical Activity: Not on file  Stress: Not on file  Social Connections: Not on file    Allergies:  Allergies  Allergen Reactions   Lamotrigine Hives    Metabolic Disorder Labs: Lab Results  Component Value Date   HGBA1C 5.7 (H) 11/07/2020   No results found for: PROLACTIN Lab Results  Component Value Date   CHOL 165  11/07/2020   TRIG 84 11/07/2020   HDL 63 11/07/2020   CHOLHDL 2.6 11/07/2020   LDLCALC 86 11/07/2020   LDLCALC 63 03/31/2018   Lab Results  Component Value Date   TSH 0.484 03/31/2018   TSH 1.501 02/07/2014    Therapeutic Level Labs: No results found for: LITHIUM No results found for: VALPROATE No components found for:  CBMZ  Current Medications: Current Outpatient Medications  Medication Sig Dispense Refill   amLODipine (NORVASC) 10 MG tablet Take 1 tablet (10 mg total) by mouth daily. 90 tablet 1   ARIPiprazole (ABILIFY) 10 MG tablet  Take 1 tablet (10 mg total) by mouth daily. 30 tablet 3   Cholecalciferol (VITAMIN D) 50 MCG (2000 UT) tablet Take 2,000 Units by mouth daily.     famotidine (PEPCID) 20 MG tablet Take 1 tablet (20 mg total) by mouth 2 (two) times daily. To reduce stomach acid 60 tablet 2   FLUoxetine (PROZAC) 20 MG capsule Take 1 capsule (20 mg total) by mouth daily. 30 capsule 3   hydrOXYzine (ATARAX/VISTARIL) 10 MG tablet Take 1 tablet (10 mg total) by mouth 3 (three) times daily as needed. 90 tablet 3   methocarbamol (ROBAXIN) 500 MG tablet Take 1 tablet (500 mg total) by mouth 4 (four) times daily. 60 tablet 0   oxyCODONE-acetaminophen (PERCOCET/ROXICET) 5-325 MG tablet Take 1 tablet by mouth every 4 (four) hours as needed for severe pain. 50 tablet 0   prazosin (MINIPRESS) 1 MG capsule Take 1 capsule (1 mg total) by mouth at bedtime. 30 capsule 2   traZODone (DESYREL) 50 MG tablet Take 1 tablet (50 mg total) by mouth at bedtime as needed for sleep. 30 tablet 3   valACYclovir (VALTREX) 1000 MG tablet Take 1,000 mg by mouth daily.     No current facility-administered medications for this visit.     Musculoskeletal: Strength & Muscle Tone:  Unable to assess due to telhealth visit [place]:  Unable to assess due to telehealth visit Patient leans: N/A  Psychiatric Specialty Exam: Review of Systems  There were no vitals taken for this visit.There is no  height or weight on file to calculate BMI.  General Appearance: Well Groomed  Eye Contact:  Good  Speech:  Clear and Coherent and Normal Rate  Volume:  Normal  Mood:  Euthymic  Affect:  Congruent  Thought Process:  Coherent, Goal Directed and Linear  Orientation:  Full (Time, Place, and Person)  Thought Content: WDL and Logical   Suicidal Thoughts:  No  Homicidal Thoughts:  No  Memory:  Immediate;   Good Recent;   Good Remote;   Good  Judgement:  Good  Insight:  Good  Psychomotor Activity:  Normal  Concentration:  Concentration: Good and Attention Span: Good  Recall:  Good  Fund of Knowledge: Good  Language: Good  Akathisia:  No  Handed:  Right  AIMS (if indicated): Not done  Assets:  Communication Skills Desire for Improvement Financial Resources/Insurance Housing Social Support  ADL's:  Intact  Cognition: WNL  Sleep:  Good   Screenings: GAD-7    Flowsheet Row Office Visit from 06/24/2021 in [facility] Video Visit from 04/03/2021 in [facility] from 01/08/2021 in [facility] Office Visit from 12/20/2020 in [facility] Office Visit from 11/07/2020 in [facility]  Total GAD-7 Score '10 6 8 4 13      '$ PHQ2-9    [place] Office Visit from 06/24/2021 in [facility] Office Visit from 06/19/2021 in [facility] Video Visit from 04/03/2021 in [facility] from 01/08/2021 in [facility] Office Visit from 12/20/2020 in [facility]  PHQ-2 Total Score '3 4 2 3 3  '$ PHQ-9 Total Score '4 10 4 7 5      '$ Flowsheet Row Pre-Admission Testing 58 from 05/28/2021 in [facility] PREADMISSION TESTING Clinical Support from 01/08/2021 in  [place]  C-SSRS RISK CATEGORY No Risk Low Risk        Assessment and Plan: Patient reports that she is doing well on her current medication regimen.  No medication changes made today.  Patient agreeable to continue medications as prescribed. 1. Bipolar 2 disorder, major depressive episode (HCC)  Continue- ARIPiprazole 10 MG TABS; Take 10 mg by mouth daily.  Dispense: 30 tablet; Refill: 3 Continue- FLUoxetine (PROZAC) 20 MG capsule; Take 1 capsule (20 mg total) by mouth daily.  Dispense: 30 capsule; Refill: 3 Continue- traZODone (DESYREL) 50 MG tablet; Take 1 tablet (50 mg total) by mouth at bedtime as needed for sleep.  Dispense: 30 tablet; Refill: 3  2. Anxiety  Continue- hydrOXYzine (ATARAX/VISTARIL) 10 MG tablet; Take 1 tablet (10 mg total) by mouth 3 (three) times daily as needed.  Dispense: 90 tablet; Refill: 3  3. PTSD (post-traumatic stress disorder)  Continue- prazosin (MINIPRESS) 1 MG capsule; Take 1 capsule (1 mg total) by mouth at bedtime.  Dispense: 30 capsule; Refill: 3   Follow-up in 3 months Follow-up with therapy  Salley Slaughter, NP 06/30/2021, 1:43 PM

## 2021-07-09 ENCOUNTER — Ambulatory Visit (INDEPENDENT_AMBULATORY_CARE_PROVIDER_SITE_OTHER): Payer: PRIVATE HEALTH INSURANCE

## 2021-07-09 ENCOUNTER — Other Ambulatory Visit: Payer: Self-pay

## 2021-07-09 ENCOUNTER — Ambulatory Visit (INDEPENDENT_AMBULATORY_CARE_PROVIDER_SITE_OTHER): Payer: PRIVATE HEALTH INSURANCE | Admitting: Orthopaedic Surgery

## 2021-07-09 ENCOUNTER — Encounter: Payer: Self-pay | Admitting: Orthopaedic Surgery

## 2021-07-09 VITALS — Ht 65.0 in | Wt 195.0 lb

## 2021-07-09 DIAGNOSIS — M1711 Unilateral primary osteoarthritis, right knee: Secondary | ICD-10-CM | POA: Diagnosis not present

## 2021-07-09 MED ORDER — KETOROLAC TROMETHAMINE 30 MG/ML IJ SOLN: 30.0000 mg | Freq: Once | INTRAMUSCULAR | Status: AC

## 2021-07-09 MED ORDER — LIDOCAINE HCL 1 % IJ SOLN
2.0000 mL | INTRAMUSCULAR | Status: AC | PRN
  Administered 2021-07-09: 2 mL

## 2021-07-09 MED ORDER — BUPIVACAINE HCL 0.25 % IJ SOLN
2.0000 mL | INTRAMUSCULAR | Status: AC | PRN
  Administered 2021-07-09: 2 mL via INTRA_ARTICULAR

## 2021-07-09 NOTE — Progress Notes (Signed)
Office Visit Note   Patient: Theresa Cantrell           Date of Birth: 07-11-1976           MRN: [id number] Visit Date: 07/09/2021              Requested by: Ladell Pier, MD [address] [place],  [place] [address] PCP: Ladell Pier, MD   Assessment & Plan: Visit Diagnoses:  1. Primary osteoarthritis of right knee     Plan: Impression is right knee arthritis flareup.  Today, we discussed reinjecting the knee with Toradol as it is only been 2 months since cortisone injection.  She would like to proceed with this plan.  She will follow-up with [place] if her symptoms return.  Call with concerns or questions in meantime.  Follow-Up Instructions: Return if symptoms worsen or fail to improve.   Orders:  Orders Placed This Encounter  Procedures   Large Joint Inj: R knee   XR KNEE 3 VIEW RIGHT   Meds ordered this encounter  Medications   ketorolac (TORADOL) 30 MG/ML injection 30 mg       Procedures: Large Joint Inj: R knee on 07/09/2021 9:53 AM Indications: pain Details: 22 G needle, anterolateral approach Medications: 2 mL lidocaine 1 %; 2 mL bupivacaine 0.25 %     Clinical Data: No additional findings.   Subjective: Chief Complaint  Patient presents with   Right Knee - Pain    HPI is a pleasant 45 year old female who comes in today with recurrent right knee pain.  History of underlying degenerative joint disease.  She was seen in our office in February where Toradol injection was performed.  This helped for several months.  She was seen again about 2 months ago where right knee cortisone injection was performed.  This initially seemed to help but her pain returned.  No known injury but she recently underwent cervical spine surgery and has been trying to walk for exercise.  The pain she has is primarily to the lateral aspect and is worse with stairs, pivoting as well as at night.  She has been taking Tylenol without significant relief.  Review of Systems as  detailed in HPI.  All other reviewed and are negative.   Objective: Vital Signs: Ht '5\' 5"'$  (1.651 m)   Wt 195 lb (88.5 kg)   BMI 32.45 kg/m   Physical Exam well-developed well-nourished female in no acute distress.  Alert and oriented x3.  Ortho Exam right knee exam shows no effusion.  Range of motion 0 to 100 degrees.  Lateral joint line tenderness.  Ligaments are stable.  She is neurovascular intact distally.  Specialty Comments:  No specialty comments available.  Imaging: XR KNEE 3 VIEW RIGHT  Result Date: 07/09/2021 X-rays demonstrate moderate degenerative changes the medial patellofemoral compartments    PMFS History: Patient Active Problem List   Diagnosis Date Noted   S/P cervical spinal fusion 06/17/2021   Spinal stenosis of cervical region 04/02/2021   Protrusion of cervical intervertebral disc 04/02/2021   COVID-19 vaccine series not completed 12/20/2020   Influenza vaccine refused 11/07/2020   Essential hypertension 11/07/2020   Obesity (BMI 30.0-34.9) 11/07/2020   Tobacco dependence 11/07/2020   Gastroesophageal reflux disease without esophagitis 11/07/2020   History of abnormal cervical Pap smear 11/07/2020   History of bipolar disorder 11/07/2020   Bipolar 2 disorder, major depressive episode ([place]) 06/12/2020   PTSD (post-traumatic stress disorder) 06/12/2020   Chronic pain  of left knee 10/05/2019   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   Neuropathy 02/07/2014   Hot flashes 02/07/2014   Weight loss 02/07/2014   Anxiety    Past Medical History:  Diagnosis Date   Anxiety    Arthritis    Bipolar 1 disorder (HCC)    Depression    GERD (gastroesophageal reflux disease)    Hypertension    PTSD (post-traumatic stress disorder)     Family History  Problem Relation Age of Onset   Hypertension Mother    Cancer Mother        breast   Anxiety disorder Mother    Hypertension Father    Schizophrenia Father    Bipolar disorder Father     Anxiety disorder Father    Colon cancer Neg Hx    Colon polyps Neg Hx    Esophageal cancer Neg Hx    Rectal cancer Neg Hx    Stomach cancer Neg Hx     Past Surgical History:  Procedure Laterality Date   ABDOMINAL HYSTERECTOMY     ANTERIOR CERVICAL DECOMP/DISCECTOMY FUSION N/A 06/06/2021   Procedure: C4-5, C5-6 ANTERIOR CERVICAL DISCECTOMY FUSION, ALLOGRAFT, PLATE;  Surgeon: Marybelle Killings, MD;  Location: [facility];  Service: Orthopedics;  Laterality: N/A;   CHOLECYSTECTOMY     DILATION AND CURETTAGE OF UTERUS     FOOT SURGERY Bilateral    two pins in each foot   JOINT REPLACEMENT Left 2018   partial knee replacement   KNEE SURGERY Left 2018   partial knee replacement   NECK SURGERY  06/06/2021   C4-5, C5-6 ANTERIOR CERVICAL DISCECTOMY FUSION, ALLOGRAFT, PLATE   Social History   Occupational History   Not on file  Tobacco Use   Smoking status: Former    Packs/day: 0.25    Types: Cigarettes   Smokeless tobacco: Never  Vaping Use   Vaping Use: Never used  Substance and Sexual Activity   Alcohol use: Yes    Alcohol/week: 2.0 standard drinks    Types: 1 Glasses of wine, 1 Cans of beer per week   Drug use: No   Sexual activity: Not Currently    Birth control/protection: Surgical

## 2021-07-17 ENCOUNTER — Other Ambulatory Visit: Payer: Self-pay

## 2021-07-17 ENCOUNTER — Ambulatory Visit (INDEPENDENT_AMBULATORY_CARE_PROVIDER_SITE_OTHER): Payer: No Payment, Other | Admitting: Licensed Clinical Social Worker

## 2021-07-17 DIAGNOSIS — F3181 Bipolar II disorder: Secondary | ICD-10-CM

## 2021-07-23 NOTE — Progress Notes (Signed)
   THERAPIST PROGRESS NOTE   Virtual Visit via Video Note  I connected with Theresa Cantrell on 07/17/21 at  4:00 PM EDT by a video enabled telemedicine application and verified that I am speaking with the correct person using two identifiers.  Location: Patient: Friend's home alone Provider: [facility]   I discussed the limitations of evaluation and management by telemedicine and the availability of in person appointments. The patient expressed understanding and agreed to proceed. I discussed the assessment and treatment plan with the patient. The patient was provided an opportunity to ask questions and all were answered. The patient agreed with the plan and demonstrated an understanding of the instructions.   The patient was advised to call back or seek an in-person evaluation if the symptoms worsen or if the condition fails to improve as anticipated.  I provided 28 minutes of non-face-to-face time during this encounter.  Participation Level: Active  Behavioral Response: CasualAlertEuthymic  Type of Therapy: Individual Therapy  Treatment Goals addressed: Communication: dep/anx/coping  Interventions: Solution Focused and Supportive  Summary: Theresa Cantrell is a 45 y.o. female who presents with hx Bipolar dis/dep/anx. This date pt signs on for virtual video session. Last seen 04/23/21. She is noted to have on a neck brace. Prior chart review allows clinician to know she had expected cervicale spine fusion surgery completed 06/06/21. Pt confirms surgery and states she hopes to get brace off next wk. Pt reports this is the reason she cancelled last appt in Aug. Pt is still unable to drive so having video session today but states she prefers to come back in person. Pt has good eye contact with appropriate smiles and laughter during session. She reports she was very dep for the first couple of wks after surgery. She advises at this point her dep is "1" on a 0-10. She reports physical pain is "3"  and relived with OTC meds. She states she is still having problems with mobility of R arm and her knees have been hurting d/t increased walking so she had to cut back. Just had a Toradol injection in knee 2 wks ago and is hopeful this will provide relief. Reports 2 falls without injury d/t knees giving way. Pt taking MH meds as prescribed. She states her sleep was very poor right after surgery, had to sleep in recliner but she is back in bed within the past few days so sleep is improved. Pt concerned about finances. She does have ST and LT dis via work but the income is limited. Dtr continues to live with her and is working so they are keeping bills paid together. Pt reports she quit smoking cigarettes to aid in her healing process per surgeon. She states last one was Aug 3 and she is feeling very positive about this change. Pt started smoking at age 31. Reviewed benefits of this decision and provided encouragement. Pt reflects on weight gain since quitting. Provided education on some dietary tips to consider. Pt denies other worries/concerns. LCSW reviewed poc including scheduling prior to close of session. Pt states appreciation for care.    Suicidal/Homicidal: Nowithout intent/plan  Therapist Response: Pt remains receptive to care.   Plan: Return again for next avail appt.  Diagnosis: Axis I: Bipolar, mixed   Hermine Messick, LCSW 07/23/2021

## 2021-07-25 ENCOUNTER — Ambulatory Visit (INDEPENDENT_AMBULATORY_CARE_PROVIDER_SITE_OTHER): Payer: PRIVATE HEALTH INSURANCE | Admitting: Orthopaedic Surgery

## 2021-07-25 ENCOUNTER — Encounter: Payer: Self-pay | Admitting: Orthopaedic Surgery

## 2021-07-25 ENCOUNTER — Ambulatory Visit: Payer: Self-pay

## 2021-07-25 VITALS — BP 147/97 | HR 108 | Ht 65.0 in | Wt 195.0 lb

## 2021-07-25 DIAGNOSIS — Z981 Arthrodesis status: Secondary | ICD-10-CM

## 2021-07-25 DIAGNOSIS — M25511 Pain in right shoulder: Secondary | ICD-10-CM

## 2021-07-25 NOTE — Progress Notes (Signed)
Post-Op Visit Note   Patient: Theresa Cantrell           Date of Birth: 22-Jan-1976           MRN: [id number] Visit Date: 07/25/2021 PCP: Ladell Pier, MD   Assessment & Plan: Post two-level cervical fusion.  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  She had some overhead lifting activities and states she cannot lift her right arm overhead due to pain and weakness.  She did have hemicord compression at C4-5 C5-6 at the time of surgery and x-rays show her fusion is solid without motion.  She has positive impingement positive drop arm test.  Patient needs an MRI scan of her right shoulder before she can resume work activity.  It is difficult to determine if she has supraspinatus tear or if this is related to the previous cord compression at C4-5.  Patient's numbness is resolved from surgery and she has good wrist range of motion so is more likely right rotator cuff pathology.   Chief Complaint:  Chief Complaint  Patient presents with   Neck - Follow-up    06/06/2021 C4-5, C5-6 ACDF   Visit Diagnoses:  1. S/P cervical spinal fusion     Plan: Work note given no work x2 weeks office follow-up after MRI.  Follow-Up Instructions: No follow-ups on file.   Orders:  Orders Placed This Encounter  Procedures   XR Cervical Spine 2 or 3 views   No orders of the defined types were placed in this encounter.   Imaging: No results found.  PMFS History: Patient Active Problem List   Diagnosis Date Noted   S/P cervical spinal fusion 06/17/2021   Spinal stenosis of cervical region 04/02/2021   Protrusion of cervical intervertebral disc 04/02/2021   COVID-19 vaccine series not completed 12/20/2020   Influenza vaccine refused 11/07/2020   Essential hypertension 11/07/2020   Obesity (BMI 30.0-34.9) 11/07/2020   Tobacco dependence 11/07/2020   Gastroesophageal reflux disease without esophagitis 11/07/2020   History of abnormal cervical Pap  smear 11/07/2020   History of bipolar disorder 11/07/2020   Bipolar 2 disorder, major depressive episode ([place]) 06/12/2020   PTSD (post-traumatic stress disorder) 06/12/2020   Chronic pain of left knee 10/05/2019   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   Neuropathy 02/07/2014   Hot flashes 02/07/2014   Weight loss 02/07/2014   Anxiety    Past Medical History:  Diagnosis Date   Anxiety    Arthritis    Bipolar 1 disorder (HCC)    Depression    GERD (gastroesophageal reflux disease)    Hypertension    PTSD (post-traumatic stress disorder)     Family History  Problem Relation Age of Onset   Hypertension Mother    Cancer Mother        breast   Anxiety disorder Mother    Hypertension Father    Schizophrenia Father    Bipolar disorder Father    Anxiety disorder Father    Colon cancer Neg Hx    Colon polyps Neg Hx    Esophageal cancer Neg Hx    Rectal cancer Neg Hx    Stomach cancer Neg Hx     Past Surgical History:  Procedure Laterality Date   ABDOMINAL HYSTERECTOMY     ANTERIOR CERVICAL DECOMP/DISCECTOMY FUSION N/A 06/06/2021   Procedure: C4-5, C5-6 ANTERIOR CERVICAL DISCECTOMY FUSION, ALLOGRAFT, PLATE;  Surgeon: Marybelle Killings, MD;  Location: [facility];  Service: Orthopedics;  Laterality: N/A;   CHOLECYSTECTOMY     DILATION AND CURETTAGE OF UTERUS     FOOT SURGERY Bilateral    two pins in each foot   JOINT REPLACEMENT Left 2018   partial knee replacement   KNEE SURGERY Left 2018   partial knee replacement   NECK SURGERY  06/06/2021   C4-5, C5-6 ANTERIOR CERVICAL DISCECTOMY FUSION, ALLOGRAFT, PLATE   Social History   Occupational History   Not on file  Tobacco Use   Smoking status: Former    Packs/day: 0.25    Types: Cigarettes   Smokeless tobacco: Never  Vaping Use   Vaping Use: Never used  Substance and Sexual Activity   Alcohol use: Yes    Alcohol/week: 2.0 standard drinks    Types: 1 Glasses of wine, 1 Cans of beer per week   Drug  use: No   Sexual activity: Not Currently    Birth control/protection: Surgical

## 2021-07-28 ENCOUNTER — Other Ambulatory Visit: Payer: Self-pay | Admitting: Internal Medicine

## 2021-07-28 NOTE — Telephone Encounter (Signed)
Requested medications are due for refill today.  yes  Requested medications are on the active medications list.  yes  Last refill. 12/04/2020  Future visit scheduled.   yes  Notes to clinic.  Rx written by historical provider.

## 2021-07-28 NOTE — Telephone Encounter (Signed)
Copied from [place] [id number]. Topic: Quick Communication - Rx Refill/Question >> Jul 28, 2021 12:39 PM Tessa Lerner A wrote: Medication: valACYclovir (VALTREX) 1000 MG tablet [[id number]]   Has the patient contacted their pharmacy? Yes.   (Agent: If no, request that the patient contact the pharmacy for the refill.) (Agent: If yes, when and what did the pharmacy advise?)  Preferred Pharmacy (with phone number or street name): [place], [place] - [address]  Phone:  [phone] Fax:  [phone]  Has the patient been seen for an appointment in the last year OR does the patient have an upcoming appointment? Yes.    Agent: Please be advised that RX refills may take up to 3 business days. We ask that you follow-up with your pharmacy.

## 2021-07-29 ENCOUNTER — Telehealth: Payer: Self-pay | Admitting: Orthopaedic Surgery

## 2021-07-29 NOTE — Telephone Encounter (Signed)
Received call from patient. She needs 9/23 ov note faxed to [facility], [facility]. I faxed [phone]

## 2021-07-31 ENCOUNTER — Other Ambulatory Visit: Payer: Self-pay | Admitting: Nurse Practitioner

## 2021-07-31 ENCOUNTER — Encounter: Payer: Self-pay | Admitting: Nurse Practitioner

## 2021-08-01 ENCOUNTER — Other Ambulatory Visit: Payer: Self-pay | Admitting: Nurse Practitioner

## 2021-08-01 MED ORDER — VALACYCLOVIR HCL 1 G PO TABS: 1000.0000 mg | ORAL_TABLET | Freq: Every day | ORAL | 3 refills | Status: AC

## 2021-08-07 ENCOUNTER — Telehealth: Payer: Self-pay | Admitting: Orthopaedic Surgery

## 2021-08-07 NOTE — Telephone Encounter (Signed)
Ok to continue out of work until follow up in the office for MRI review?

## 2021-08-07 NOTE — Telephone Encounter (Signed)
Pt calling asking for an updated doctors note stating her appt had changed. The mri could not be done until the 15th of this month and made follow up review for [facility] on 08/22/21. Pt also asked if we could fax a copy of the updated note to her short term disability ins company, their fax number is [phone]. The best call back for the patient is [phone].

## 2021-08-08 NOTE — Telephone Encounter (Signed)
Note entered and faxed per patient's request. Patient advised.

## 2021-08-12 ENCOUNTER — Encounter: Payer: PRIVATE HEALTH INSURANCE | Admitting: Orthopaedic Surgery

## 2021-08-12 ENCOUNTER — Telehealth: Payer: Self-pay | Admitting: Orthopaedic Surgery

## 2021-08-12 NOTE — Telephone Encounter (Signed)
Form received PAI. Faxed [phone]

## 2021-08-16 ENCOUNTER — Other Ambulatory Visit: Payer: PRIVATE HEALTH INSURANCE

## 2021-08-22 ENCOUNTER — Encounter: Payer: PRIVATE HEALTH INSURANCE | Admitting: Orthopaedic Surgery

## 2021-08-25 ENCOUNTER — Ambulatory Visit: Payer: PRIVATE HEALTH INSURANCE | Admitting: Internal Medicine

## 2021-09-03 ENCOUNTER — Telehealth: Payer: Self-pay | Admitting: Orthopaedic Surgery

## 2021-09-03 NOTE — Telephone Encounter (Signed)
FYI Patient called advised she have not had her MRI yet due to the insurance company denied. Patient said once she get the MRI rescheduled she will rescheduled her appointment with Dr Lorin Mercy.     [phone]

## 2021-09-03 NOTE — Telephone Encounter (Signed)
noted 

## 2021-09-11 ENCOUNTER — Telehealth: Payer: Self-pay | Admitting: Orthopaedic Surgery

## 2021-09-11 NOTE — Telephone Encounter (Signed)
Pt called stating her shoulder has been feeling better and she would like to get released back to work. Pt would like a CB when this is ready for pickup.   [phone]

## 2021-09-11 NOTE — Telephone Encounter (Signed)
Please advise 

## 2021-09-12 NOTE — Telephone Encounter (Signed)
I spoke with patient. She requested return to work note to be dated for 09/17/2021. Note entered and at front for patient to pick up.

## 2021-09-16 ENCOUNTER — Telehealth: Payer: Self-pay | Admitting: Orthopaedic Surgery

## 2021-09-16 NOTE — Telephone Encounter (Signed)
Pt asking for a new note that states she can go back to work without any restrictions. Her work told her it must state that she does not have any restrictions in order to return. Pt said we can print it off and leave it at the front desk. The best call back number if needed is [phone].

## 2021-09-16 NOTE — Telephone Encounter (Signed)
Note updated and entered to include no restrictions and placed at front desk for pick up.

## 2021-09-18 ENCOUNTER — Other Ambulatory Visit: Payer: Self-pay

## 2021-09-18 ENCOUNTER — Ambulatory Visit (INDEPENDENT_AMBULATORY_CARE_PROVIDER_SITE_OTHER): Payer: PRIVATE HEALTH INSURANCE | Admitting: Licensed Clinical Social Worker

## 2021-09-18 DIAGNOSIS — F3181 Bipolar II disorder: Secondary | ICD-10-CM

## 2021-09-19 NOTE — Progress Notes (Signed)
   THERAPIST PROGRESS NOTE  Session Time: 45 min  Participation Level: Active  Behavioral Response: CasualAlertAnxious  Type of Therapy: Individual Therapy  Treatment Goals addressed: Communication: dep/anx/coping  Interventions: Motivational Interviewing and Supportive  Summary: Theresa Cantrell is a 45 y.o. female who presents with hx of Bipolar Dis.  Today patient returns for in person session.  Patient last seen virtually on September 15.  Patient reports she is overall doing well.  She has continued to heal from her surgery.  Patient reports she has some ongoing pain with right shoulder.  She states because she lost her health insurance she is unable to get the recommended MRI to evaluate the etiology of this pain.  Patient then states her doctor has released her to return to work with no restrictions.  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  Patient reports being anxious and scared about this return to work since she does not know what is happening with her right shoulder.  Patient reports at the same time she is under so much financial strain that she must return to work and the return to work will again provide her with health insurance.  Patient is encouraged to ask for help at work as needed for safety.  Patient reports she is taking medications as prescribed.  When asked she states her depression and anxiety are both "5" on a 0-10 scale.  Patient reports good sleep at this time. LCSW assessed for symptoms of seasonal affective disorder, which patient endorses.  Patient reports her daughter and best friend are both doing well after both recovering from RSV.  LCSW assessed for the status of patient's smoking cessation.  Patient reports she continues to stay completely free from cigarettes and is committed to maintaining this status.  LCSW commended her for this choice.  Patient reports she can most definitely  appreciate the benefits in her health and shortness of breath.  Patient further reports she has been instrumental in helping one of her friends quit smoking and feels very positive about this.  LCSW assessed for any new worries or concerns.  Patient reports she is worried about her mother who is living in a dilapidated home in [place] that has mold.  She has encouraged mother to move in with her because her daughter is probably moving out but mother has refused as she goes to see her brother daily at a nursing home in [place].  LCSW assisted patient to process concerns and worry.  LCSW assessed for patient's plans for upcoming Thanksgiving holiday.  Patient looking forward to the holiday, reports she loves to cook, and will be cooking for approximately 10 people.  LCSW reviewed all coping strategies. LCSW reviewed poc including scheduling prior to close of session. Pt states appreciation for care.   Suicidal/Homicidal: Nowithout intent/plan  Therapist Response: Pt receptive to care.  Plan: Return again in 4 weeks.  Diagnosis: Axis I: Bipolar, mixed  Hermine Messick, LCSW 09/19/2021

## 2021-09-24 ENCOUNTER — Encounter: Payer: PRIVATE HEALTH INSURANCE | Admitting: Family Medicine

## 2021-10-01 ENCOUNTER — Ambulatory Visit: Payer: Self-pay | Admitting: *Deleted

## 2021-10-01 ENCOUNTER — Emergency Department (INDEPENDENT_AMBULATORY_CARE_PROVIDER_SITE_OTHER): Payer: PRIVATE HEALTH INSURANCE

## 2021-10-01 ENCOUNTER — Emergency Department (INDEPENDENT_AMBULATORY_CARE_PROVIDER_SITE_OTHER)
Admission: RE | Admit: 2021-10-01 | Discharge: 2021-10-01 | Disposition: A | Discharge status: Home / Self Care | Payer: PRIVATE HEALTH INSURANCE | Source: Ambulatory Visit | Attending: Family Medicine | Admitting: Family Medicine

## 2021-10-01 ENCOUNTER — Other Ambulatory Visit: Payer: Self-pay

## 2021-10-01 VITALS — BP 130/85 | HR 75 | Temp 98.5°F | Resp 20 | Ht 65.0 in | Wt 210.0 lb

## 2021-10-01 DIAGNOSIS — I10 Essential (primary) hypertension: Secondary | ICD-10-CM | POA: Diagnosis not present

## 2021-10-01 DIAGNOSIS — R600 Localized edema: Secondary | ICD-10-CM | POA: Diagnosis not present

## 2021-10-01 MED ORDER — FUROSEMIDE 20 MG PO TABS: 20.0000 mg | ORAL_TABLET | Freq: Every day | ORAL | 0 refills | Status: DC

## 2021-10-01 MED ORDER — LISINOPRIL 5 MG PO TABS: 5.0000 mg | ORAL_TABLET | Freq: Every day | ORAL | 0 refills | Status: DC

## 2021-10-01 NOTE — Telephone Encounter (Signed)
Reason for Disposition  [1] MODERATE leg swelling (e.g., swelling extends up to knees) AND [2] new-onset or worsening    Swelling extends up both legs into thighs.  Answer Assessment - Initial Assessment Questions 1. ONSET: "When did the swelling start?" (e.g., minutes, hours, days)     Pt calling in.  Having swelling in my legs and feet.   This is the third day this has happened. It started last weekend Thanksgiving. 2. LOCATION: "What part of the leg is swollen?"  "Are both legs swollen or just one leg?"     Swollen from thighs to down my toes.   My left leg is a little more swollen.  I've never had this happen before.  They've been swollen since Thanksgiving.   I've been propping my legs up.   They were still swollen this morning.  I didn't go to work because I can't hardly walk today.    3. SEVERITY: "How bad is the swelling?" (e.g., localized; mild, moderate, severe)  - Localized - small area of swelling localized to one leg  - MILD pedal edema - swelling limited to foot and ankle, pitting edema < 1/4 inch (6 mm) deep, rest and elevation eliminate most or all swelling  - MODERATE edema - swelling of lower leg to knee, pitting edema > 1/4 inch (6 mm) deep, rest and elevation only partially reduce swelling  - SEVERE edema - swelling extends above knee, facial or hand swelling present      Moderate   I can hardly walk today.   I stayed home from work.   4. REDNESS: "Does the swelling look red or infected?"     No  5. PAIN: "Is the swelling painful to touch?" If Yes, ask: "How painful is it?"   (Scale 1-10; mild, moderate or severe)     They are sore to touch.  No idention left when I press into the swelling.   The top part of my legs is sore. 6. FEVER: "Do you have a fever?" If Yes, ask: "What is it, how was it measured, and when did it start?"      I feel like my right leg and foot is hurting.    I'm not sure if I have fever or not but don't think so 7. CAUSE: "What do you think is causing  the leg swelling?"     I recently have surgery in Aug.   I went back to work before Thanksgiving so maybe that's causing it.   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   8. MEDICAL HISTORY: "Do you have a history of heart failure, kidney disease, liver failure, or cancer?"     No 9. RECURRENT SYMPTOM: "Have you had leg swelling before?" If Yes, ask: "When was the last time?" "What happened that time?"     No 10. OTHER SYMPTOMS: "Do you have any other symptoms?" (e.g., chest pain, difficulty breathing)       No 11. PREGNANCY: "Is there any chance you are pregnant?" "When was your last menstrual period?"       Not asked  Protocols used: Leg Swelling and Edema-A-AH

## 2021-10-01 NOTE — ED Triage Notes (Signed)
Pt states that she has some swelling of both legs. X2 days  Pt states that she has had swelling of her feet for three times in one week.  Pt states that she was unable to get the swelling down this time.

## 2021-10-01 NOTE — ED Provider Notes (Signed)
[facility]    CSN: [id number] Arrival date & time: 10/01/21  1545      History   Chief Complaint Chief Complaint  Patient presents with   Leg Swelling    Pt states that she has some leg swelling. X2 days    HPI Theresa Cantrell is a 45 y.o. female.   HPI Patient had cervical spine surgery illness.  She was just released back to work the week of Thanksgiving.  Patient states she worked few days that week but developed swelling in her legs.  Has not been able to work since then because of swelling.  She states it swells other to her thighs.  She states that it hurts.  She comes in [place] heavily on the cane.  States she has had multiple surgeries on her left knee and a knee replacement.  She has a little bit more swelling in the left leg. No abdominal distention.  No hand or face swelling.  No shortness of breath.  No chest pain.  No history of heart disease. Patient does have essential hypertension.  Took Norvasc 5 for years.  In June her Norvasc was increased to 10 mg a day.  Past Medical History:  Diagnosis Date   Anxiety    Arthritis    Bipolar 1 disorder ([place])    Depression    GERD (gastroesophageal reflux disease)    Hypertension    PTSD (post-traumatic stress disorder)     Patient Active Problem List   Diagnosis Date Noted   S/P cervical spinal fusion 06/17/2021   Spinal stenosis of cervical region 04/02/2021   Protrusion of cervical intervertebral disc 04/02/2021   COVID-19 vaccine series not completed 12/20/2020   Influenza vaccine refused 11/07/2020   Essential hypertension 11/07/2020   Obesity (BMI 30.0-34.9) 11/07/2020   Tobacco dependence 11/07/2020   Gastroesophageal reflux disease without esophagitis 11/07/2020   History of abnormal cervical Pap smear 11/07/2020   History of bipolar disorder 11/07/2020   Bipolar 2 disorder, major depressive episode ([place]) 06/12/2020   PTSD (post-traumatic stress disorder) 06/12/2020   Chronic pain of left knee  10/05/2019   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   Neuropathy 02/07/2014   Hot flashes 02/07/2014   Weight loss 02/07/2014   Anxiety     Past Surgical History:  Procedure Laterality Date   ABDOMINAL HYSTERECTOMY     ANTERIOR CERVICAL DECOMP/DISCECTOMY FUSION N/A 06/06/2021   Procedure: C4-5, C5-6 ANTERIOR CERVICAL DISCECTOMY FUSION, ALLOGRAFT, PLATE;  Surgeon: Marybelle Killings, MD;  Location: [facility];  Service: Orthopedics;  Laterality: N/A;   CHOLECYSTECTOMY     DILATION AND CURETTAGE OF UTERUS     FOOT SURGERY Bilateral    two pins in each foot   JOINT REPLACEMENT Left 2018   partial knee replacement   KNEE SURGERY Left 2018   partial knee replacement   NECK SURGERY  06/06/2021   C4-5, C5-6 ANTERIOR CERVICAL DISCECTOMY FUSION, ALLOGRAFT, PLATE    OB History     Gravida  2   Para  1   Term  1   Preterm  0   AB  1   Living  1      SAB  0   IAB  1   Ectopic  0   Multiple  0   Live Births               Home Medications    Prior to Admission medications   Medication Sig  Start Date End Date Taking? Authorizing Provider  amLODipine (NORVASC) 10 MG tablet Take 1 tablet (10 mg total) by mouth daily. 04/21/21  Yes Ladell Pier, MD  ARIPiprazole (ABILIFY) 10 MG tablet Take 1 tablet (10 mg total) by mouth daily. 06/30/21  Yes Eulis Canner E, NP  Cholecalciferol (VITAMIN D) 50 MCG (2000 UT) tablet Take 2,000 Units by mouth daily.   Yes [provider]  famotidine (PEPCID) 20 MG tablet Take 1 tablet (20 mg total) by mouth 2 (two) times daily. To reduce stomach acid 09/05/20  Yes Fulp, Cammie, MD  FLUoxetine (PROZAC) 20 MG capsule Take 1 capsule (20 mg total) by mouth daily. 06/30/21  Yes Eulis Canner E, NP  furosemide (LASIX) 20 MG tablet Take 1 tablet (20 mg total) by mouth daily. 10/01/21  Yes Raylene Everts, MD  hydrOXYzine (ATARAX/VISTARIL) 10 MG tablet Take 1 tablet (10 mg total) by mouth 3 (three) times daily  as needed. 06/30/21  Yes Eulis Canner E, NP  lisinopril (ZESTRIL) 5 MG tablet Take 1 tablet (5 mg total) by mouth daily. 10/01/21  Yes Raylene Everts, MD  prazosin (MINIPRESS) 1 MG capsule Take 1 capsule (1 mg total) by mouth at bedtime. 06/30/21  Yes Eulis Canner E, NP  traZODone (DESYREL) 50 MG tablet Take 1 tablet (50 mg total) by mouth at bedtime as needed for sleep. 06/30/21  Yes Salley Slaughter, NP    Family History Family History  Problem Relation Age of Onset   Hypertension Mother    Cancer Mother        breast   Anxiety disorder Mother    Hypertension Father    Schizophrenia Father    Bipolar disorder Father    Anxiety disorder Father    Colon cancer Neg Hx    Colon polyps Neg Hx    Esophageal cancer Neg Hx    Rectal cancer Neg Hx    Stomach cancer Neg Hx     Social History Social History   Tobacco Use   Smoking status: Former    Packs/day: 0.25    Types: Cigarettes   Smokeless tobacco: Never  Vaping Use   Vaping Use: Never used  Substance Use Topics   Alcohol use: Yes    Alcohol/week: 2.0 standard drinks    Types: 1 Glasses of wine, 1 Cans of beer per week   Drug use: No     Allergies   Lamotrigine   Review of Systems Review of Systems  See HPI Physical Exam Triage Vital Signs ED Triage Vitals  Enc Vitals Group     BP 10/01/21 1601 130/85     Pulse Rate 10/01/21 1601 75     Resp 10/01/21 1601 20     Temp 10/01/21 1601 98.5 F (36.9 C)     Temp Source 10/01/21 1601 Oral     SpO2 10/01/21 1601 97 %     Weight 10/01/21 1559 210 lb (95.3 kg)     Height 10/01/21 1559 5\' 5"  (1.651 m)     Head Circumference --      Peak Flow --      Pain Score 10/01/21 1558 8     Pain Loc --      Pain Edu? --      Excl. in Arial? --    No data found.  Updated Vital Signs BP 130/85 (BP Location: Left Arm)   Pulse 75   Temp 98.5 F (36.9 C) (Oral)   Resp 20  Ht 5\' 5"  (1.651 m)   Wt 95.3 kg   SpO2 97%   BMI 34.95 kg/m      Physical  Exam Constitutional:      General: She is not in acute distress.    Appearance: She is well-developed.     Comments: Antalgic gait.  Cane in right hand.  HENT:     Head: Normocephalic and atraumatic.     Mouth/Throat:     Comments: Mask is in place Eyes:     Conjunctiva/sclera: Conjunctivae normal.     Pupils: Pupils are equal, round, and reactive to light.  Cardiovascular:     Rate and Rhythm: Normal rate and regular rhythm.     Heart sounds: Normal heart sounds.  Pulmonary:     Effort: Pulmonary effort is normal. No respiratory distress.     Breath sounds: Normal breath sounds. No rales.  Abdominal:     General: There is no distension.     Palpations: Abdomen is soft.  Musculoskeletal:        General: Normal range of motion.     Cervical back: Normal range of motion.     Right lower leg: Edema present.     Left lower leg: Edema present.     Comments: Patient has 1-2+ pitting edema of the left ankle.  Trace at the tibial tuberosity.  Patient has trace to 1+ pitting edema of the right ankle.  Cannot elicit any pitting in the thighs  Skin:    General: Skin is warm and dry.  Neurological:     Mental Status: She is alert. Mental status is at baseline.     UC Treatments / Results  Labs (all labs ordered are listed, but only abnormal results are displayed) Labs Reviewed  COMPLETE METABOLIC PANEL WITH GFR  BRAIN NATRIURETIC PEPTIDE    EKG   Radiology DG Chest 2 View  Result Date: 10/01/2021 CLINICAL DATA:  Bilateral lower extremity edema. EXAM: CHEST - 2 VIEW COMPARISON:  Chest radiograph dated 06/11/2019. FINDINGS: The heart size and mediastinal contours are within normal limits. Both lungs are clear. The visualized skeletal structures are unremarkable. IMPRESSION: No active cardiopulmonary disease. Electronically Signed   By: Anner Crete M.D.   On: 10/01/2021 17:33    Procedures Procedures (including critical care time)  Medications Ordered in UC Medications -  No data to display  Initial Impression / Assessment and Plan / UC Course  I have reviewed the triage vital signs and the nursing notes.  Pertinent labs & imaging results that were available during my care of the patient were reviewed by me and considered in my medical decision making (see chart for details).     EKG is normal Chest x-ray is normal Lab work is pending.  I will check in the morning I believe he she is having pedal edema from her increased dose of amlodipine.  Do not suspect any more serious condition.  She is to follow-up with her primary care doctor. Final Clinical Impressions(s) / UC Diagnoses   Final diagnoses:  Pedal edema  Essential hypertension, benign     Discharge Instructions      Stop Norvasc (amlodipine) Continue to avoid salt in your diet Elevate your legs Start lisinopril 1 pill a day Follow-up with your doctor on December 15 as scheduled Take furosemide fluid pill 1 a day for 3 days.  Take in morning     ED Prescriptions     Medication Sig Dispense Auth. Provider  lisinopril (ZESTRIL) 5 MG tablet Take 1 tablet (5 mg total) by mouth daily. 30 tablet Raylene Everts, MD   furosemide (LASIX) 20 MG tablet Take 1 tablet (20 mg total) by mouth daily. 3 tablet Raylene Everts, MD      PDMP not reviewed this encounter.   Raylene Everts, MD 10/01/21 [phone]

## 2021-10-01 NOTE — ED Triage Notes (Signed)
Pt states that she spoke with a nurse over the phone because she was unable to get an appt with her pcp.  Pt was instructed to come to urgent care or to ED.

## 2021-10-01 NOTE — Discharge Instructions (Signed)
Stop Norvasc (amlodipine) Continue to avoid salt in your diet Elevate your legs Start lisinopril 1 pill a day Follow-up with your doctor on December 15 as scheduled Take furosemide fluid pill 1 a day for 3 days.  Take in morning

## 2021-10-01 NOTE — Telephone Encounter (Signed)
Pt called in c/o having bilateral swollen legs from her thighs all the way down to her toes since Thanksgiving   She started a new job before Thanksgiving and is on her feet 8 hrs a day and thinks it may be from that.   See triage notes.  There are no appts available at [facility] so I offered her the virtual care option via her MyChart which she has decided she will do.   I let her know her other option would be go to the urgent care.    She's going to do the MyChart virtual visit.  I let her know to call back if she has any problems.   She verbalized understanding.

## 2021-10-02 LAB — COMPLETE METABOLIC PANEL WITH GFR
AG Ratio: 1.8 (calc) (ref 1.0–2.5)
ALT: 19 U/L (ref 6–29)
AST: 22 U/L (ref 10–35)
Albumin: 4.2 g/dL (ref 3.6–5.1)
Alkaline phosphatase (APISO): 94 U/L (ref 31–125)
BUN/Creatinine Ratio: 19 (calc) (ref 6–22)
BUN: 21 mg/dL (ref 7–25)
CO2: 29 mmol/L (ref 20–32)
Calcium: 9.5 mg/dL (ref 8.6–10.2)
Chloride: 107 mmol/L (ref 98–110)
Creat: 1.12 mg/dL — ABNORMAL HIGH (ref 0.50–0.99)
Globulin: 2.4 g/dL (calc) (ref 1.9–3.7)
Glucose, Bld: 86 mg/dL (ref 65–99)
Potassium: 4.3 mmol/L (ref 3.5–5.3)
Sodium: 143 mmol/L (ref 135–146)
Total Bilirubin: 0.3 mg/dL (ref 0.2–1.2)
Total Protein: 6.6 g/dL (ref 6.1–8.1)
eGFR: 61 mL/min/{1.73_m2} (ref >=60)

## 2021-10-03 LAB — EXTRA LAV TOP TUBE

## 2021-10-03 LAB — BRAIN NATRIURETIC PEPTIDE

## 2021-10-06 ENCOUNTER — Telehealth (INDEPENDENT_AMBULATORY_CARE_PROVIDER_SITE_OTHER): Payer: PRIVATE HEALTH INSURANCE | Admitting: Psychiatry

## 2021-10-06 ENCOUNTER — Encounter (HOSPITAL_COMMUNITY): Payer: Self-pay | Admitting: Psychiatry

## 2021-10-06 DIAGNOSIS — F4310 Post-traumatic stress disorder, unspecified: Secondary | ICD-10-CM | POA: Diagnosis not present

## 2021-10-06 DIAGNOSIS — F419 Anxiety disorder, unspecified: Secondary | ICD-10-CM | POA: Diagnosis not present

## 2021-10-06 DIAGNOSIS — F3181 Bipolar II disorder: Secondary | ICD-10-CM

## 2021-10-06 MED ORDER — FLUOXETINE HCL 20 MG PO CAPS: 20.0000 mg | ORAL_CAPSULE | Freq: Every day | ORAL | 3 refills | Status: DC

## 2021-10-06 MED ORDER — TRAZODONE HCL 50 MG PO TABS: 50.0000 mg | ORAL_TABLET | Freq: Every evening | ORAL | 3 refills | Status: DC | PRN

## 2021-10-06 MED ORDER — HYDROXYZINE HCL 10 MG PO TABS: 10.0000 mg | ORAL_TABLET | Freq: Three times a day (TID) | ORAL | 3 refills | Status: DC | PRN

## 2021-10-06 MED ORDER — PRAZOSIN HCL 1 MG PO CAPS: 1.0000 mg | ORAL_CAPSULE | Freq: Every day | ORAL | 3 refills | Status: DC

## 2021-10-06 MED ORDER — ARIPIPRAZOLE 10 MG PO TABS: 10.0000 mg | ORAL_TABLET | Freq: Every day | ORAL | 3 refills | Status: DC

## 2021-10-06 NOTE — Progress Notes (Signed)
BH MD/PA/NP OP Progress Note Virtual Visit via Telephone Note  I connected with Theresa Cantrell on 10/06/21 at  1:00 PM EST by telephone and verified that I am speaking with the correct person using two identifiers.  Location: Patient: home Provider: Clinic   I discussed the limitations, risks, security and privacy concerns of performing an evaluation and management service by telephone and the availability of in person appointments. I also discussed with the patient that there may be a patient responsible charge related to this service. The patient expressed understanding and agreed to proceed.   I provided 30 minutes of non-face-to-face time during this encounter.       10/06/2021 1:35 PM Theresa Cantrell  MRN:  [id number]  Chief Complaint: "I have been having some pain"  HPI: 45 year old female seen today for follow up psychiatric evaluation.  She has a psychiatric history of  Bipolar, depression, anxiety, and PTSD.  She is currently being managed on Abilify 10 mg daily, prazosin 1 mg nightly, trazodone 25 mg to 50 mg as needed sleep, hydroxyzine 10 mg three times daily, and Prozac 20 mg daily.  Patient notes that her medications are effective in managing her psychiatric conditions.  Today she was unable to login virtually so her assessment was done over the phone. During exam she was pleasant, cooperative, and engaged in conversation.  She informed Probation officer that she continues to have pain in her right shoulder, neck, and arm. She notes that she lost insurances coverage after she lost her job but notes that her PCP wants her to have an MRI. Patient notes that her physical health, finances, and bills cause her anxiety but notes that she is able to cope with it. Provider conducted a GAD 7 and patient scored an 8, at last visit she scored a 10. Provider also conducted a PHQ 9 and patient scored a 10, at her last visit she scored an 18. She notes that at times her sleep fluctuates but overall  notes that it is adequate. She also notes her appetite has increased since she stopped smoking cigarettes. Today she denies SI/HI/VAH, mania, or paranoia.  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 She however notes that she wish she was able to work more as she is struggling financially.   Overall patient notes that she is doing well.  No medication changes made today. Patient agreeable to continue all medications as prescribed. No other concerns noted at this time.    Visit Diagnosis:    ICD-10-CM   1. Bipolar 2 disorder, major depressive episode (HCC)  F31.81 FLUoxetine (PROZAC) 20 MG capsule    traZODone (DESYREL) 50 MG tablet    2. Anxiety  F41.9 hydrOXYzine (ATARAX) 10 MG tablet    3. PTSD (post-traumatic stress disorder)  F43.10 prazosin (MINIPRESS) 1 MG capsule      Past Psychiatric History:  Bipolar, depression, anxiety, and PTSD.  Past Medical History:  Past Medical History:  Diagnosis Date   Anxiety    Arthritis    Bipolar 1 disorder ([place])    Depression    GERD (gastroesophageal reflux disease)    Hypertension    PTSD (post-traumatic stress disorder)     Past Surgical History:  Procedure Laterality Date   ABDOMINAL HYSTERECTOMY     ANTERIOR CERVICAL DECOMP/DISCECTOMY FUSION N/A 06/06/2021   Procedure: C4-5, C5-6 ANTERIOR CERVICAL DISCECTOMY FUSION, ALLOGRAFT, PLATE;  Surgeon: Marybelle Killings,  MD;  Location: [facility];  Service: Orthopedics;  Laterality: N/A;   CHOLECYSTECTOMY     DILATION AND CURETTAGE OF UTERUS     FOOT SURGERY Bilateral    two pins in each foot   JOINT REPLACEMENT Left 2018   partial knee replacement   KNEE SURGERY Left 2018   partial knee replacement   NECK SURGERY  06/06/2021   C4-5, C5-6 ANTERIOR CERVICAL DISCECTOMY FUSION, ALLOGRAFT, PLATE    Family Psychiatric History: Mother Bipolar and depression, father schizophrenia and depression, bipolar.  Family History:   Family History  Problem Relation Age of Onset   Hypertension Mother    Cancer Mother        breast   Anxiety disorder Mother    Hypertension Father    Schizophrenia Father    Bipolar disorder Father    Anxiety disorder Father    Colon cancer Neg Hx    Colon polyps Neg Hx    Esophageal cancer Neg Hx    Rectal cancer Neg Hx    Stomach cancer Neg Hx     Social History:  Social History   Socioeconomic History   Marital status: Single    Spouse name: Not on file   Number of children: 1   Years of education: Not on file   Highest education level: Some college, no degree  Occupational History   Not on file  Tobacco Use   Smoking status: Former    Packs/day: 0.25    Types: Cigarettes   Smokeless tobacco: Never  Vaping Use   Vaping Use: Never used  Substance and Sexual Activity   Alcohol use: Yes    Alcohol/week: 2.0 standard drinks    Types: 1 Glasses of wine, 1 Cans of beer per week   Drug use: No   Sexual activity: Not Currently    Birth control/protection: Surgical  Other Topics Concern   Not on file  Social History Narrative   Not on file   Social Determinants of Health   Financial Resource Strain: Not on file  Food Insecurity: Not on file  Transportation Needs: Not on file  Physical Activity: Not on file  Stress: Not on file  Social Connections: Not on file    Allergies:  Allergies  Allergen Reactions   Lamotrigine Hives    Metabolic Disorder Labs: Lab Results  Component Value Date   HGBA1C 5.7 (H) 11/07/2020   No results found for: PROLACTIN Lab Results  Component Value Date   CHOL 165 11/07/2020   TRIG 84 11/07/2020   HDL 63 11/07/2020   CHOLHDL 2.6 11/07/2020   LDLCALC 86 11/07/2020   LDLCALC 63 03/31/2018   Lab Results  Component Value Date   TSH 0.484 03/31/2018   TSH 1.501 02/07/2014    Therapeutic Level Labs: No results found for: LITHIUM No results found for: VALPROATE No components found for:  CBMZ  Current  Medications: Current Outpatient Medications  Medication Sig Dispense Refill   amLODipine (NORVASC) 10 MG tablet Take 1 tablet (10 mg total) by mouth daily. 90 tablet 1   ARIPiprazole (ABILIFY) 10 MG tablet Take 1 tablet (10 mg total) by mouth daily. 30 tablet 3   Cholecalciferol (VITAMIN D) 50 MCG (2000 UT) tablet Take 2,000 Units by mouth daily.     famotidine (PEPCID) 20 MG tablet Take 1 tablet (20 mg total) by mouth 2 (two) times daily. To reduce stomach acid 60 tablet 2   FLUoxetine (PROZAC) 20 MG capsule Take 1 capsule (20  mg total) by mouth daily. 30 capsule 3   furosemide (LASIX) 20 MG tablet Take 1 tablet (20 mg total) by mouth daily. 3 tablet 0   hydrOXYzine (ATARAX) 10 MG tablet Take 1 tablet (10 mg total) by mouth 3 (three) times daily as needed. 90 tablet 3   lisinopril (ZESTRIL) 5 MG tablet Take 1 tablet (5 mg total) by mouth daily. 30 tablet 0   prazosin (MINIPRESS) 1 MG capsule Take 1 capsule (1 mg total) by mouth at bedtime. 30 capsule 3   traZODone (DESYREL) 50 MG tablet Take 1 tablet (50 mg total) by mouth at bedtime as needed for sleep. 30 tablet 3   No current facility-administered medications for this visit.     Musculoskeletal: Strength & Muscle Tone:  Unable to assess due to telephone visit Gait & Station:  Unable to assess due to telehphone visit Patient leans: N/A  Psychiatric Specialty Exam: Review of Systems  There were no vitals taken for this visit.There is no height or weight on file to calculate BMI.  General Appearance:  Unable to assess due to telephone visit  Eye Contact:   Unable to assess due to telephone visit  Speech:  Clear and Coherent and Normal Rate  Volume:  Normal  Mood:  Euthymic  Affect:  Congruent  Thought Process:  Coherent, Goal Directed and Linear  Orientation:  Full (Time, Place, and Person)  Thought Content: WDL and Logical   Suicidal Thoughts:  No  Homicidal Thoughts:  No  Memory:  Immediate;   Good Recent;   Good Remote;    Good  Judgement:  Good  Insight:  Good  Psychomotor Activity:  Normal  Concentration:  Concentration: Good and Attention Span: Good  Recall:  Good  Fund of Knowledge: Good  Language: Good  Akathisia:  No  Handed:  Right  AIMS (if indicated): Not done  Assets:  Communication Skills Desire for Improvement Financial Resources/Insurance Housing Social Support  ADL's:  Intact  Cognition: WNL  Sleep:  Good   Screenings: GAD-7    Flowsheet Row Video Visit from 10/06/2021 in [facility] Office Visit from 06/24/2021 in [facility] Video Visit from 04/03/2021 in [facility] from 01/08/2021 in [facility] Office Visit from 12/20/2020 in [facility]  Total GAD-7 Score 8 10 6 8 4       PHQ2-9    Flowsheet Row Video Visit from 10/06/2021 in [facility] Office Visit from 06/24/2021 in [facility] Office Visit from 06/19/2021 in [facility] Video Visit from 04/03/2021 in [facility] from 01/08/2021 in [facility]  PHQ-2 Total Score 3 3 4 2 3   PHQ-9 Total Score 10 4 10 4 7       Flowsheet Row Video Visit from 10/06/2021 in [facility] ED from 10/01/2021 in [facility] from 05/28/2021 in [facility] PREADMISSION TESTING  C-SSRS RISK CATEGORY No Risk No Risk No Risk        Assessment and Plan: Patient reports that at times she is anxious due to her physical health and finances. She however notes that she is able to cope with it. No medication changes made today. Patient agreeable to continue medications as prescribed.  1. Bipolar 2 disorder, major  depressive episode  (HCC)  Continue- ARIPiprazole 10 MG TABS; Take 10 mg by mouth daily.  Dispense: 30 tablet; Refill: 3 Continue- FLUoxetine (PROZAC) 20 MG capsule; Take 1 capsule (20 mg total) by mouth daily.  Dispense: 30 capsule; Refill: 3 Continue- traZODone (DESYREL) 50 MG tablet; Take 1 tablet (50 mg total) by mouth at bedtime as needed for sleep.  Dispense: 30 tablet; Refill: 3  2. Anxiety  Continue- hydrOXYzine (ATARAX/VISTARIL) 10 MG tablet; Take 1 tablet (10 mg total) by mouth 3 (three) times daily as needed.  Dispense: 90 tablet; Refill: 3  3. PTSD (post-traumatic stress disorder)  Continue- prazosin (MINIPRESS) 1 MG capsule; Take 1 capsule (1 mg total) by mouth at bedtime.  Dispense: 30 capsule; Refill: 3   Follow-up in 3 months Follow-up with therapy  Salley Slaughter, NP 10/06/2021, 1:35 PM

## 2021-10-16 ENCOUNTER — Ambulatory Visit: Payer: PRIVATE HEALTH INSURANCE | Attending: Internal Medicine | Admitting: Internal Medicine

## 2021-10-16 ENCOUNTER — Ambulatory Visit (HOSPITAL_COMMUNITY): Payer: No Payment, Other | Admitting: Licensed Clinical Social Worker

## 2021-10-16 ENCOUNTER — Other Ambulatory Visit: Payer: Self-pay

## 2021-10-16 VITALS — BP 148/84 | HR 77 | Ht 64.0 in | Wt 208.0 lb

## 2021-10-16 DIAGNOSIS — I10 Essential (primary) hypertension: Secondary | ICD-10-CM | POA: Diagnosis not present

## 2021-10-16 DIAGNOSIS — Z2821 Immunization not carried out because of patient refusal: Secondary | ICD-10-CM

## 2021-10-16 DIAGNOSIS — E669 Obesity, unspecified: Secondary | ICD-10-CM | POA: Diagnosis not present

## 2021-10-16 DIAGNOSIS — R3581 Nocturnal polyuria: Secondary | ICD-10-CM | POA: Diagnosis not present

## 2021-10-16 DIAGNOSIS — R87629 Unspecified abnormal cytological findings in specimens from vagina: Secondary | ICD-10-CM | POA: Diagnosis not present

## 2021-10-16 DIAGNOSIS — Z6837 Body mass index (BMI) 37.0-37.9, adult: Secondary | ICD-10-CM

## 2021-10-16 LAB — POCT GLYCOSYLATED HEMOGLOBIN (HGB A1C): HbA1c, POC (controlled diabetic range): 5.4 % (ref 0.0–7.0)

## 2021-10-16 LAB — GLUCOSE, POCT (MANUAL RESULT ENTRY): POC Glucose: 102 mg/dl — AB (ref 70–99)

## 2021-10-16 MED ORDER — LISINOPRIL 10 MG PO TABS
10.0000 mg | ORAL_TABLET | Freq: Every day | ORAL | 4 refills | Status: DC
Start: 2021-10-16 — End: 2021-11-05

## 2021-10-16 NOTE — Progress Notes (Signed)
Patient ID: Theresa Cantrell, female    DOB: 1976/10/02  MRN: [id number]  CC: Urinary Frequency (At night ) and Obesity   Subjective: Theresa Cantrell is a 45 y.o. female who presents for chronic ds management Her concerns today include:  Patient with history of HTN, former tob dep, GERD, obesity,  PTSD, Bipolar 2,  CTS RT hand, family history of breast cancer in her mother.  HTN:  seen at [facility] end 10/01/2021 with complaint of lower extremity edema.  It was felt that the swelling was due to amlodipine so it was discontinued.  She was placed on lisinopril 5 mg instead. -swelling resolved with stopping the amlodipine -Tolerating Lisinopril.  Checks BP once a day.  Gives range 138-140s/70-80s.  Took med already for this morning.  Limits salt in her foods  Still free of cigarettes.  Obesity:  up 13 lbs since last visit Admits to overeating.  "I just have to have something in my hand since I'm done with cigarettes."  Also she had been out of work for 3 mths after having neck surgery; sat at home and eat more. Returned to work 3 wks ago so this has helped -was walking before weather change.  Nothing recently.  Referred to GYN on visit with me 04/2021.  Called but did not have insurance at the time Insurance just restarted.  Has appt 12/2021  C/o frequent urination at nights x 2 mth Drinks  water, juice, sweet tea. Endorses frequent thirst.   No dysuria or blood in urine. No incontinence Fhx DM in [place]. Pt has preDM based on A1C done 11 mths ago   Patient Active Problem List   Diagnosis Date Noted   S/P cervical spinal fusion 06/17/2021   Spinal stenosis of cervical region 04/02/2021   Protrusion of cervical intervertebral disc 04/02/2021   COVID-19 vaccine series not completed 12/20/2020   Influenza vaccine refused 11/07/2020   Essential hypertension 11/07/2020   Obesity (BMI 30.0-34.9) 11/07/2020   Tobacco dependence 11/07/2020   Gastroesophageal reflux disease without esophagitis  11/07/2020   History of abnormal cervical Pap smear 11/07/2020   History of bipolar disorder 11/07/2020   Bipolar 2 disorder, major depressive episode ([place]) 06/12/2020   PTSD (post-traumatic stress disorder) 06/12/2020   Chronic pain of left knee 10/05/2019   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   Neuropathy 02/07/2014   Hot flashes 02/07/2014   Weight loss 02/07/2014   Anxiety      Current Outpatient Medications on File Prior to Visit  Medication Sig Dispense Refill   ARIPiprazole (ABILIFY) 10 MG tablet Take 1 tablet (10 mg total) by mouth daily. 30 tablet 3   Cholecalciferol (VITAMIN D) 50 MCG (2000 UT) tablet Take 2,000 Units by mouth daily.     famotidine (PEPCID) 20 MG tablet Take 1 tablet (20 mg total) by mouth 2 (two) times daily. To reduce stomach acid 60 tablet 2   FLUoxetine (PROZAC) 20 MG capsule Take 1 capsule (20 mg total) by mouth daily. 30 capsule 3   hydrOXYzine (ATARAX) 10 MG tablet Take 1 tablet (10 mg total) by mouth 3 (three) times daily as needed. 90 tablet 3   prazosin (MINIPRESS) 1 MG capsule Take 1 capsule (1 mg total) by mouth at bedtime. 30 capsule 3   traZODone (DESYREL) 50 MG tablet Take 1 tablet (50 mg total) by mouth at bedtime as needed for sleep. 30 tablet 3   No current facility-administered medications on file prior to visit.  Allergies  Allergen Reactions   Lamotrigine Hives    Social History   Socioeconomic History   Marital status: Single    Spouse name: Not on file   Number of children: 1   Years of education: Not on file   Highest education level: Some college, no degree  Occupational History   Not on file  Tobacco Use   Smoking status: Former    Packs/day: 0.25    Types: Cigarettes   Smokeless tobacco: Never  Vaping Use   Vaping Use: Never used  Substance and Sexual Activity   Alcohol use: Yes    Alcohol/week: 2.0 standard drinks    Types: 1 Glasses of wine, 1 Cans of beer per week   Drug use: No    Sexual activity: Not Currently    Birth control/protection: Surgical  Other Topics Concern   Not on file  Social History Narrative   Not on file   Social Determinants of Health   Financial Resource Strain: Not on file  Food Insecurity: Not on file  Transportation Needs: Not on file  Physical Activity: Not on file  Stress: Not on file  Social Connections: Not on file  Intimate Partner Violence: Not on file    Family History  Problem Relation Age of Onset   Hypertension Mother    Cancer Mother        breast   Anxiety disorder Mother    Hypertension Father    Schizophrenia Father    Bipolar disorder Father    Anxiety disorder Father    Colon cancer Neg Hx    Colon polyps Neg Hx    Esophageal cancer Neg Hx    Rectal cancer Neg Hx    Stomach cancer Neg Hx     Past Surgical History:  Procedure Laterality Date   ABDOMINAL HYSTERECTOMY     ANTERIOR CERVICAL DECOMP/DISCECTOMY FUSION N/A 06/06/2021   Procedure: C4-5, C5-6 ANTERIOR CERVICAL DISCECTOMY FUSION, ALLOGRAFT, PLATE;  Surgeon: Marybelle Killings, MD;  Location: [facility];  Service: Orthopedics;  Laterality: N/A;   CHOLECYSTECTOMY     DILATION AND CURETTAGE OF UTERUS     FOOT SURGERY Bilateral    two pins in each foot   JOINT REPLACEMENT Left 2018   partial knee replacement   KNEE SURGERY Left 2018   partial knee replacement   NECK SURGERY  06/06/2021   C4-5, C5-6 ANTERIOR CERVICAL DISCECTOMY FUSION, ALLOGRAFT, PLATE    ROS: Review of Systems Negative except as stated above  PHYSICAL EXAM: BP (!) 148/84    Pulse 77    Ht 5\' 4"  (1.626 m)    Wt 208 lb (94.3 kg)    SpO2 99%    BMI 35.70 kg/m   Wt Readings from Last 3 Encounters:  10/16/21 208 lb (94.3 kg)  10/01/21 210 lb (95.3 kg)  07/25/21 195 lb (88.5 kg)    Physical Exam  General appearance - alert, well appearing, obese middle age AAF and in no distress Mental status - normal mood, behavior, speech, dress, motor activity, and thought processes Neck -  supple, no significant adenopathy Chest - clear to auscultation, no wheezes, rales or rhonchi, symmetric air entry Heart - normal rate, regular rhythm, normal S1, S2, no murmurs, rubs, clicks or gallops Extremities - peripheral pulses normal, no pedal edema, no clubbing or cyanosis   CMP Latest Ref Rng & Units 10/01/2021 05/28/2021 11/07/2020  Glucose 65 - 99 mg/dL 86 64(L) 100(H)  BUN 7 - 25 mg/dL 21  24(H) 17  Creatinine 0.50 - 0.99 mg/dL 1.12(H) 1.03(H) 1.00  Sodium 135 - 146 mmol/L 143 136 147(H)  Potassium 3.5 - 5.3 mmol/L 4.3 3.6 4.2  Chloride 98 - 110 mmol/L 107 104 107(H)  CO2 20 - 32 mmol/L 29 28 28   Calcium 8.6 - 10.2 mg/dL 9.5 9.3 9.9  Total Protein 6.1 - 8.1 g/dL 6.6 6.6 6.9  Total Bilirubin 0.2 - 1.2 mg/dL 0.3 0.3 <0.2  Alkaline Phos 38 - 126 U/L - 87 112  AST 10 - 35 U/L 22 21 14   ALT 6 - 29 U/L 19 10 14    Lipid Panel     Component Value Date/Time   CHOL 165 11/07/2020 1610   TRIG 84 11/07/2020 1610   HDL 63 11/07/2020 1610   CHOLHDL 2.6 11/07/2020 1610   LDLCALC 86 11/07/2020 1610    CBC    Component Value Date/Time   WBC 6.9 05/28/2021 1530   RBC 4.39 05/28/2021 1530   HGB 13.2 05/28/2021 1530   HGB 14.2 11/07/2020 1610   HCT 40.8 05/28/2021 1530   HCT 42.3 11/07/2020 1610   PLT 240 05/28/2021 1530   PLT 250 11/07/2020 1610   MCV 92.9 05/28/2021 1530   MCV 91 11/07/2020 1610   MCH 30.1 05/28/2021 1530   MCHC 32.4 05/28/2021 1530   RDW 13.3 05/28/2021 1530   RDW 12.5 11/07/2020 1610   Results for orders placed or performed in visit on 10/16/21  POCT glucose (manual entry)  Result Value Ref Range   POC Glucose 102 (A) 70 - 99 mg/dl  POCT glycosylated hemoglobin (Hb A1C)  Result Value Ref Range   Hemoglobin A1C     HbA1c POC (<> result, manual entry)     HbA1c, POC (prediabetic range)     HbA1c, POC (controlled diabetic range) 5.4 0.0 - 7.0 %    ASSESSMENT AND PLAN: 1. Essential hypertension Not at goal. Increase lisinopril to 10 mg daily.   DASH diet encouraged. - lisinopril (ZESTRIL) 10 MG tablet; Take 1 tablet (10 mg total) by mouth daily.  Dispense: 30 tablet; Refill: 4  2. Obesity (BMI 35.0-39.9 without comorbidity) Dietary counseling given.  Advised to eliminate sugary drinks from the diet and to try to cut back on her portion sizes as much as possible.  Encouraged her to get in some exercise at least 3 to 5 days a week for 30 minutes.  Went over exercises that she can do indoors at home like walking in place for 30 minutes. - Amb ref to Medical Nutrition Therapy-MNT - POCT glucose (manual entry) - POCT glycosylated hemoglobin (Hb A1C)  3. Nocturnal polyuria Screen for diabetes is negative I suspect the problem may be due to increased consumption of fluids including caffeinated beverages in the evenings.  She tells me that she normally gets in bed around 10:00 p.m.  I told her to avoid drinking caffeinated beverages like sweet tea and caffeinated sodas altogether but particularly, she should avoid drinking them within 4 to 5 hours of bedtime  4. Abnormal Pap smear of vagina Keep upcoming appointment with gynecologist in February  5. Influenza vaccination declined  Patient was given the opportunity to ask questions.  Patient verbalized understanding of the plan and was able to repeat key elements of the plan.   Orders Placed This Encounter  Procedures   Amb ref to Medical Nutrition Therapy-MNT   POCT glucose (manual entry)   POCT glycosylated hemoglobin (Hb A1C)     Requested Prescriptions  Signed Prescriptions Disp Refills   lisinopril (ZESTRIL) 10 MG tablet 30 tablet 4    Sig: Take 1 tablet (10 mg total) by mouth daily.    Return in about 4 months (around 02/14/2022).  Karle Plumber, MD, FACP

## 2021-10-16 NOTE — Patient Instructions (Signed)
Your blood pressure is not at goal.  Goal is 130/80 or lower.  Increase lisinopril to 10 mg daily.  Continue to monitor blood pressure at least twice a week.  We have referred you to the nutritionist.  Healthy Eating Following a healthy eating pattern may help you to achieve and maintain a healthy body weight, reduce the risk of chronic disease, and live a long and productive life. It is important to follow a healthy eating pattern at an appropriate calorie level for your body. Your nutritional needs should be met primarily through food by choosing a variety of nutrient-rich foods. What are tips for following this plan? Reading food labels Read labels and choose the following: Reduced or low sodium. Juices with 100% fruit juice. Foods with low saturated fats and high polyunsaturated and monounsaturated fats. Foods with whole grains, such as whole wheat, cracked wheat, brown rice, and wild rice. Whole grains that are fortified with folic acid. This is recommended for women who are pregnant or who want to become pregnant. Read labels and avoid the following: Foods with a lot of added sugars. These include foods that contain brown sugar, corn sweetener, corn syrup, dextrose, fructose, glucose, high-fructose corn syrup, honey, invert sugar, lactose, malt syrup, maltose, molasses, raw sugar, sucrose, trehalose, or turbinado sugar. Do not eat more than the following amounts of added sugar per day: 6 teaspoons (25 g) for women. 9 teaspoons (38 g) for men. Foods that contain processed or refined starches and grains. Refined grain products, such as white flour, degermed cornmeal, white bread, and white rice. Shopping Choose nutrient-rich snacks, such as vegetables, whole fruits, and nuts. Avoid high-calorie and high-sugar snacks, such as potato chips, fruit snacks, and candy. Use oil-based dressings and spreads on foods instead of solid fats such as butter, stick margarine, or cream cheese. Limit  pre-made sauces, mixes, and "instant" products such as flavored rice, instant noodles, and ready-made pasta. Try more plant-protein sources, such as tofu, tempeh, black beans, edamame, lentils, nuts, and seeds. Explore eating plans such as the Mediterranean diet or vegetarian diet. Cooking Use oil to saut or stir-fry foods instead of solid fats such as butter, stick margarine, or lard. Try baking, boiling, grilling, or broiling instead of frying. Remove the fatty part of meats before cooking. Steam vegetables in water or broth. Meal planning  At meals, imagine dividing your plate into fourths: One-half of your plate is fruits and vegetables. One-fourth of your plate is whole grains. One-fourth of your plate is protein, especially lean meats, poultry, eggs, tofu, beans, or nuts. Include low-fat dairy as part of your daily diet. Lifestyle Choose healthy options in all settings, including home, work, school, restaurants, or stores. Prepare your food safely: Wash your hands after handling raw meats. Keep food preparation surfaces clean by regularly washing with hot, soapy water. Keep raw meats separate from ready-to-eat foods, such as fruits and vegetables. Cook seafood, meat, poultry, and eggs to the recommended internal temperature. Store foods at safe temperatures. In general: Keep cold foods at 8F (4.4C) or below. Keep hot foods at 18F (60C) or above. Keep your freezer at [facility] (-17.8C) or below. Foods are no longer safe to eat when they have been between the temperatures of 40-18F (4.4-60C) for more than 2 hours. What foods should I eat? Fruits Aim to eat 2 cup-equivalents of fresh, canned (in natural juice), or frozen fruits each day. Examples of 1 cup-equivalent of fruit include 1 small apple, 8 large strawberries, 1 cup canned fruit,  cup dried fruit, or 1 cup 100% juice. Vegetables Aim to eat 2-3 cup-equivalents of fresh and frozen vegetables each day, including  different varieties and colors. Examples of 1 cup-equivalent of vegetables include 2 medium carrots, 2 cups raw, leafy greens, 1 cup chopped vegetable (raw or cooked), or 1 medium baked potato. Grains Aim to eat 6 ounce-equivalents of whole grains each day. Examples of 1 ounce-equivalent of grains include 1 slice of bread, 1 cup ready-to-eat cereal, 3 cups popcorn, or  cup cooked rice, pasta, or cereal. Meats and other proteins Aim to eat 5-6 ounce-equivalents of protein each day. Examples of 1 ounce-equivalent of protein include 1 egg, 1/2 cup nuts or seeds, or 1 tablespoon (16 g) peanut butter. A cut of meat or fish that is the size of a deck of cards is about 3-4 ounce-equivalents. Of the protein you eat each week, try to have at least 8 ounces come from seafood. This includes salmon, trout, herring, and anchovies. Dairy Aim to eat 3 cup-equivalents of fat-free or low-fat dairy each day. Examples of 1 cup-equivalent of dairy include 1 cup (240 mL) milk, 8 ounces (250 g) yogurt, 1 ounces (44 g) natural cheese, or 1 cup (240 mL) fortified soy milk. Fats and oils Aim for about 5 teaspoons (21 g) per day. Choose monounsaturated fats, such as canola and olive oils, avocados, peanut butter, and most nuts, or polyunsaturated fats, such as sunflower, corn, and soybean oils, walnuts, pine nuts, sesame seeds, sunflower seeds, and flaxseed. Beverages Aim for six 8-oz glasses of water per day. Limit coffee to three to five 8-oz cups per day. Limit caffeinated beverages that have added calories, such as soda and energy drinks. Limit alcohol intake to no more than 1 drink a day for nonpregnant women and 2 drinks a day for men. One drink equals 12 oz of beer (355 mL), 5 oz of wine (148 mL), or 1 oz of hard liquor (44 mL). Seasoning and other foods Avoid adding excess amounts of salt to your foods. Try flavoring foods with herbs and spices instead of salt. Avoid adding sugar to foods. Try using oil-based  dressings, sauces, and spreads instead of solid fats. This information is based on general [place] nutrition guidelines. For more information, visit [employer].es. Exact amounts may vary based on your nutrition needs. Summary A healthy eating plan may help you to maintain a healthy weight, reduce the risk of chronic diseases, and stay active throughout your life. Plan your meals. Make sure you eat the right portions of a variety of nutrient-rich foods. Try baking, boiling, grilling, or broiling instead of frying. Choose healthy options in all settings, including home, work, school, restaurants, or stores. This information is not intended to replace advice given to you by your health care provider. Make sure you discuss any questions you have with your health care provider. Document Revised: 06/17/2021 Document Reviewed: 06/17/2021 Elsevier Patient Education  [place].

## 2021-10-21 ENCOUNTER — Ambulatory Visit: Payer: Self-pay | Admitting: *Deleted

## 2021-10-21 NOTE — Telephone Encounter (Signed)
°  Chief Complaint: Hypertension Symptoms: BP 150's-175/80's-90's. Highest 175/94 over weekend. Bilateral feet and ankle edema "Twice the size." Frequency: Onset over weekend Pertinent Negatives: Patient denies CP, SOB, no headache or dizziness presently, did present over weekend. Disposition: [] ED /[] Urgent Care (no appt availability in office) / [x] Appointment(In office/virtual)/ []  [facility]/ [] Home Care/ [] Refused Recommended Disposition  Additional Notes:  Lisinopril increased at OV 12/15 22. Pt questioning if she should "Go back on the fluid pill." Lasix 3 day course prescribed at [facility] 10/01/21. Care advise given, pt verbalizes understanding. Assured pt NT would route to practice for PCPs review and final disposition. States may respond regarding med in [place].

## 2021-10-21 NOTE — Telephone Encounter (Signed)
Reason for Disposition  Systolic BP  >= 614 OR Diastolic >= 431  Answer Assessment - Initial Assessment Questions 1. BLOOD PRESSURE: "What is the blood pressure?" "Did you take at least two measurements 5 minutes apart?"     This AM, 158/94 2. ONSET: "When did you take your blood pressure?"     0600 this AM 3. HOW: "How did you obtain the blood pressure?" (e.g., visiting nurse, automatic home BP monitor)     Home 4. HISTORY: "Do you have a history of high blood pressure?"     Yes 5. MEDICATIONS: "Are you taking any medications for blood pressure?" "Have you missed any doses recently?"     Yes, no missed doses 6. OTHER SYMPTOMS: "Do you have any symptoms?" (e.g., headache, chest pain, blurred vision, difficulty breathing, weakness)     Headaches, dizziness at times  over weekend, not presently.      Edema of both feet and ankles,twice the normal size, onset weekend.  Protocols used: Blood Pressure - High-A-AH

## 2021-10-21 NOTE — Telephone Encounter (Signed)
Patient name and DOB verified-  States she picked up Lisinopril on Friday.  Readings for BP - 174/94.  States she had headache and was dizzy this weekend. Since Saturday she has not had a reading that high.   Advised patient that BP medications will take a several days to take effect in system. Should see reading improve. Advised to call office. Goal for BP is 130/80 or lower.    She has BLE. Wearing compression stockings but is on her feet all day at work.    Pls advise:- BLE                   - is f/u with Lurena Joiner recommended

## 2021-10-22 MED ORDER — HYDROCHLOROTHIAZIDE 12.5 MG PO TABS: 12.5000 mg | ORAL_TABLET | Freq: Every day | ORAL | 1 refills | Status: DC

## 2021-10-22 NOTE — Telephone Encounter (Signed)
Patient aware of message per Dr. Wynetta Emery.  Scheduled appt with Clinical Pharmacist Lurena Joiner. Patient verified understanding.

## 2021-10-22 NOTE — Telephone Encounter (Signed)
Pt questioning if she should "Go back on the fluid pill."

## 2021-10-22 NOTE — Addendum Note (Signed)
Addended by: Karle Plumber B on: 10/22/2021 12:28 PM   Modules accepted: Orders

## 2021-10-24 ENCOUNTER — Other Ambulatory Visit: Payer: Self-pay

## 2021-10-24 ENCOUNTER — Encounter: Payer: Self-pay | Admitting: Orthopaedic Surgery

## 2021-10-24 ENCOUNTER — Ambulatory Visit (INDEPENDENT_AMBULATORY_CARE_PROVIDER_SITE_OTHER): Payer: PRIVATE HEALTH INSURANCE | Admitting: Orthopaedic Surgery

## 2021-10-24 DIAGNOSIS — M1712 Unilateral primary osteoarthritis, left knee: Secondary | ICD-10-CM | POA: Diagnosis not present

## 2021-10-24 MED ORDER — BUPIVACAINE HCL 0.5 % IJ SOLN
2.0000 mL | INTRAMUSCULAR | Status: AC | PRN
  Administered 2021-10-24: 17:00:00 2 mL via INTRA_ARTICULAR

## 2021-10-24 MED ORDER — LIDOCAINE HCL 1 % IJ SOLN
2.0000 mL | INTRAMUSCULAR | Status: AC | PRN
  Administered 2021-10-24: 17:00:00 2 mL

## 2021-10-24 NOTE — Progress Notes (Signed)
Office Visit Note   Patient: Theresa Cantrell           Date of Birth: 04/28/1976           MRN: [id number] Visit Date: 10/24/2021              Requested by: Ladell Pier, MD [address] [place],  [place] [address] PCP: Ladell Pier, MD   Assessment & Plan: Visit Diagnoses:  1. Primary osteoarthritis of left knee     Plan: [name] returns today for ongoing left knee pain.  We performed a Toradol injection left knee in July of this year.  The injection helped a lot until a week ago.  She is requesting another 1.  She underwent patellofemoral arthroplasty by someone at [facility] per patient report.  Left knee exam is unchanged.  No joint effusion.  Left knee was injected with Toradol today.  She tolerated this well.  She did mention that she may try to figure out the doctor's name who did her surgery at [facility].  Follow-Up Instructions: No follow-ups on file.   Orders:  No orders of the defined types were placed in this encounter.  No orders of the defined types were placed in this encounter.     Procedures: Large Joint Inj: L knee on 10/24/2021 4:49 PM Details: 22 G needle Medications: 2 mL bupivacaine 0.5 %; 2 mL lidocaine 1 % Outcome: tolerated well, no immediate complications Patient was prepped and draped in the usual sterile fashion.      Clinical Data: No additional findings.   Subjective: Chief Complaint  Patient presents with   Left Knee - Pain    HPI  Review of Systems   Objective: Vital Signs: There were no vitals taken for this visit.  Physical Exam  Ortho Exam  Specialty Comments:  No specialty comments available.  Imaging: No results found.   PMFS History: Patient Active Problem List   Diagnosis Date Noted   S/P cervical spinal fusion 06/17/2021   Spinal stenosis of cervical region 04/02/2021   Protrusion of cervical intervertebral disc 04/02/2021   COVID-19 vaccine series not completed 12/20/2020    Influenza vaccine refused 11/07/2020   Essential hypertension 11/07/2020   Obesity (BMI 30.0-34.9) 11/07/2020   Tobacco dependence 11/07/2020   Gastroesophageal reflux disease without esophagitis 11/07/2020   History of abnormal cervical Pap smear 11/07/2020   History of bipolar disorder 11/07/2020   Bipolar 2 disorder, major depressive episode ([place]) 06/12/2020   PTSD (post-traumatic stress disorder) 06/12/2020   Chronic pain of left knee 10/05/2019   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   Neuropathy 02/07/2014   Hot flashes 02/07/2014   Weight loss 02/07/2014   Anxiety    Past Medical History:  Diagnosis Date   Anxiety    Arthritis    Bipolar 1 disorder (HCC)    Depression    GERD (gastroesophageal reflux disease)    Hypertension    PTSD (post-traumatic stress disorder)     Family History  Problem Relation Age of Onset   Hypertension Mother    Cancer Mother        breast   Anxiety disorder Mother    Hypertension Father    Schizophrenia Father    Bipolar disorder Father    Anxiety disorder Father    Colon cancer Neg Hx    Colon polyps Neg Hx    Esophageal cancer Neg Hx    Rectal cancer Neg Hx  Stomach cancer Neg Hx     Past Surgical History:  Procedure Laterality Date   ABDOMINAL HYSTERECTOMY     ANTERIOR CERVICAL DECOMP/DISCECTOMY FUSION N/A 06/06/2021   Procedure: C4-5, C5-6 ANTERIOR CERVICAL DISCECTOMY FUSION, ALLOGRAFT, PLATE;  Surgeon: Marybelle Killings, MD;  Location: [facility];  Service: Orthopedics;  Laterality: N/A;   CHOLECYSTECTOMY     DILATION AND CURETTAGE OF UTERUS     FOOT SURGERY Bilateral    two pins in each foot   JOINT REPLACEMENT Left 2018   partial knee replacement   KNEE SURGERY Left 2018   partial knee replacement   NECK SURGERY  06/06/2021   C4-5, C5-6 ANTERIOR CERVICAL DISCECTOMY FUSION, ALLOGRAFT, PLATE   Social History   Occupational History   Not on file  Tobacco Use   Smoking status: Former    Packs/day:  0.25    Types: Cigarettes   Smokeless tobacco: Never  Vaping Use   Vaping Use: Never used  Substance and Sexual Activity   Alcohol use: Yes    Alcohol/week: 2.0 standard drinks    Types: 1 Glasses of wine, 1 Cans of beer per week   Drug use: No   Sexual activity: Not Currently    Birth control/protection: Surgical

## 2021-11-04 ENCOUNTER — Telehealth: Payer: Self-pay | Admitting: Internal Medicine

## 2021-11-04 NOTE — Telephone Encounter (Signed)
Patient called I states, the med, hydrochlorothiazide (HYDRODIURIL) 12.5 MG tablet  makes her cough. She is looking for another alternative.

## 2021-11-04 NOTE — Telephone Encounter (Signed)
A therapeutic switch must be approved by the patient's PCP. Will route to her nurse.

## 2021-11-05 MED ORDER — VALSARTAN 40 MG PO TABS: 40.0000 mg | ORAL_TABLET | Freq: Every day | ORAL | 3 refills | Status: DC

## 2021-11-05 NOTE — Telephone Encounter (Signed)
Sent pt a Mychart message

## 2021-11-05 NOTE — Addendum Note (Signed)
Addended by: Karle Plumber B on: 11/05/2021 11:21 AM   Modules accepted: Orders

## 2021-11-05 NOTE — Telephone Encounter (Signed)
Will forward to provider  

## 2021-11-08 ENCOUNTER — Other Ambulatory Visit: Payer: Self-pay

## 2021-11-08 ENCOUNTER — Ambulatory Visit
Admission: RE | Admit: 2021-11-08 | Discharge: 2021-11-08 | Disposition: A | Discharge status: Home / Self Care | Payer: PRIVATE HEALTH INSURANCE | Source: Ambulatory Visit | Attending: Orthopaedic Surgery | Admitting: Orthopaedic Surgery

## 2021-11-08 DIAGNOSIS — M25511 Pain in right shoulder: Secondary | ICD-10-CM

## 2021-11-11 ENCOUNTER — Ambulatory Visit (INDEPENDENT_AMBULATORY_CARE_PROVIDER_SITE_OTHER): Payer: PRIVATE HEALTH INSURANCE | Admitting: Orthopaedic Surgery

## 2021-11-11 ENCOUNTER — Other Ambulatory Visit: Payer: Self-pay

## 2021-11-11 ENCOUNTER — Encounter: Payer: Self-pay | Admitting: Orthopaedic Surgery

## 2021-11-11 DIAGNOSIS — M7581 Other shoulder lesions, right shoulder: Secondary | ICD-10-CM | POA: Diagnosis not present

## 2021-11-12 ENCOUNTER — Ambulatory Visit: Payer: PRIVATE HEALTH INSURANCE | Admitting: Pharmacist

## 2021-11-12 MED ORDER — FUROSEMIDE 20 MG PO TABS: ORAL_TABLET | ORAL | 3 refills | Status: DC

## 2021-11-14 DIAGNOSIS — M7581 Other shoulder lesions, right shoulder: Secondary | ICD-10-CM | POA: Insufficient documentation

## 2021-11-14 NOTE — Progress Notes (Signed)
Office Visit Note   Patient: Theresa Cantrell           Date of Birth: Aug 05, 1976           MRN: [id number] Visit Date: 11/11/2021              Requested by: Ladell Pier, MD [address] [place] [address] PCP: Ladell Pier, MD   Assessment & Plan: Visit Diagnoses:  1. Rotator cuff tendinitis, right     Plan: We reviewed MRI scan report I gave her a copy of the report.  She does have some tendinosis in the supraspinatus as well as some subdeltoid bursitis.  She might get some relief with an injection she could return for this if she would like.  She does not have a full-thickness tear we discussed avoiding outstretch or overhead activities to rest the tendon which may give her relief of symptoms.  Alternatively she could do some low-grade therapy activity.  We can check her back in a couple months to check her progress.  Follow-Up Instructions: No follow-ups on file.   Orders:  No orders of the defined types were placed in this encounter.  No orders of the defined types were placed in this encounter.     Procedures: No procedures performed   Clinical Data: No additional findings.   Subjective: Chief Complaint  Patient presents with   Right Shoulder - Follow-up, Pain    MRI review    HPI 46 year old female post two-level cervical fusion by me returns and states she is having some continued problems with the right shoulder particularly with overhead activity.  She did have some hemicord compression in her cervical spine and fusion is healed solidly.  She has had pain with outstretch reaching overhead activity right arm only.  MRI scan has been available and is listed below under results.  She has moderate supraspinatus tendinosis with low-grade partial tearing and subacromial bursitis and some mild glenohumeral arthritis.  Review of Systems previous knee problems as documented.  All other systems are unchanged from 07/25/2021 office  visit.   Objective: Vital Signs: BP (!) 146/85    Pulse 91    Ht 5\' 4"  (1.626 m)    Wt 208 lb (94.3 kg)    BMI 35.70 kg/m   Physical Exam Constitutional:      Appearance: She is well-developed.  HENT:     Head: Normocephalic.     Right Ear: External ear normal.     Left Ear: External ear normal. There is no impacted cerumen.  Eyes:     Pupils: Pupils are equal, round, and reactive to light.  Neck:     Thyroid: No thyromegaly.     Trachea: No tracheal deviation.  Cardiovascular:     Rate and Rhythm: Normal rate.  Pulmonary:     Effort: Pulmonary effort is normal.  Abdominal:     Palpations: Abdomen is soft.  Musculoskeletal:     Cervical back: No rigidity.  Skin:    General: Skin is warm and dry.  Neurological:     Mental Status: She is alert and oriented to person, place, and time.  Psychiatric:        Behavior: Behavior normal.    Ortho Exam patient has positive impingement right shoulder.  Reflexes are symmetrical.  No brachial plexus tenderness.  Sensation hand is intact.  No impingement left shoulder.  Long head of the biceps is nontender.  Negative speeds test negative  Yergason.  Specialty Comments:  No specialty comments available.  Imaging: CLINICAL DATA:  Right shoulder pain and decreased range of motion since neck surgery last August.   EXAM: MRI OF THE RIGHT SHOULDER WITHOUT CONTRAST   TECHNIQUE: Multiplanar, multisequence MR imaging of the shoulder was performed. No intravenous contrast was administered.   COMPARISON:  Right shoulder x-rays dated October 15, 2020.   FINDINGS: Rotator cuff: Moderate supraspinatus tendinosis with low-grade partial-thickness bursal surface tear at the insertion. The infraspinatus, teres minor, and subscapularis tendons are unremarkable.   Muscles: No atrophy or abnormal signal of the muscles of the rotator cuff.   Biceps long head:  Intact and normally positioned.   Acromioclavicular Joint: Normal  acromioclavicular joint. Type II acromion. Small amount of fluid in the subacromial/subdeltoid bursa.   Glenohumeral Joint: No joint effusion. Mild partial-thickness cartilage loss over the medial humeral head and superior glenoid. Focal degenerative subchondral marrow edema in the superior glenoid.   Labrum: Grossly intact, but evaluation is limited by lack of intraarticular fluid.   Bones: No acute fracture or dislocation. No suspicious bone lesion.   Other: None.   IMPRESSION: 1. Moderate supraspinatus tendinosis with low-grade partial-thickness bursal surface tear at the insertion. 2. Mild subacromial/subdeltoid bursitis. 3. Mild glenohumeral osteoarthritis.     Electronically Signed   By: Titus Dubin M.D.   On: 11/08/2021 19:51   PMFS History: Patient Active Problem List   Diagnosis Date Noted   Rotator cuff tendinitis, right 11/14/2021   S/P cervical spinal fusion 06/17/2021   Spinal stenosis of cervical region 04/02/2021   Protrusion of cervical intervertebral disc 04/02/2021   COVID-19 vaccine series not completed 12/20/2020   Influenza vaccine refused 11/07/2020   Essential hypertension 11/07/2020   Obesity (BMI 30.0-34.9) 11/07/2020   Tobacco dependence 11/07/2020   Gastroesophageal reflux disease without esophagitis 11/07/2020   History of abnormal cervical Pap smear 11/07/2020   History of bipolar disorder 11/07/2020   Bipolar 2 disorder, major depressive episode ([place]) 06/12/2020   PTSD (post-traumatic stress disorder) 06/12/2020   Chronic pain of left knee 10/05/2019   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   Neuropathy 02/07/2014   Hot flashes 02/07/2014   Weight loss 02/07/2014   Anxiety    Past Medical History:  Diagnosis Date   Anxiety    Arthritis    Bipolar 1 disorder (HCC)    Depression    GERD (gastroesophageal reflux disease)    Hypertension    PTSD (post-traumatic stress disorder)     Family History   Problem Relation Age of Onset   Hypertension Mother    Cancer Mother        breast   Anxiety disorder Mother    Hypertension Father    Schizophrenia Father    Bipolar disorder Father    Anxiety disorder Father    Colon cancer Neg Hx    Colon polyps Neg Hx    Esophageal cancer Neg Hx    Rectal cancer Neg Hx    Stomach cancer Neg Hx     Past Surgical History:  Procedure Laterality Date   ABDOMINAL HYSTERECTOMY     ANTERIOR CERVICAL DECOMP/DISCECTOMY FUSION N/A 06/06/2021   Procedure: C4-5, C5-6 ANTERIOR CERVICAL DISCECTOMY FUSION, ALLOGRAFT, PLATE;  Surgeon: Marybelle Killings, MD;  Location: [facility];  Service: Orthopedics;  Laterality: N/A;   CHOLECYSTECTOMY     DILATION AND CURETTAGE OF UTERUS     FOOT SURGERY Bilateral    two pins in each foot  JOINT REPLACEMENT Left 2018   partial knee replacement   KNEE SURGERY Left 2018   partial knee replacement   NECK SURGERY  06/06/2021   C4-5, C5-6 ANTERIOR CERVICAL DISCECTOMY FUSION, ALLOGRAFT, PLATE   Social History   Occupational History   Not on file  Tobacco Use   Smoking status: Former    Packs/day: 0.25    Types: Cigarettes   Smokeless tobacco: Never  Vaping Use   Vaping Use: Never used  Substance and Sexual Activity   Alcohol use: Yes    Alcohol/week: 2.0 standard drinks    Types: 1 Glasses of wine, 1 Cans of beer per week   Drug use: No   Sexual activity: Not Currently    Birth control/protection: Surgical

## 2021-12-03 ENCOUNTER — Ambulatory Visit (INDEPENDENT_AMBULATORY_CARE_PROVIDER_SITE_OTHER): Payer: PRIVATE HEALTH INSURANCE | Admitting: Family Medicine

## 2021-12-03 ENCOUNTER — Other Ambulatory Visit (HOSPITAL_COMMUNITY)
Admission: RE | Admit: 2021-12-03 | Discharge: 2021-12-03 | Disposition: A | Discharge status: Home / Self Care | Payer: Medicaid Other | Source: Ambulatory Visit | Attending: Family Medicine | Admitting: Family Medicine

## 2021-12-03 ENCOUNTER — Encounter: Payer: Self-pay | Admitting: Family Medicine

## 2021-12-03 ENCOUNTER — Other Ambulatory Visit: Payer: Self-pay

## 2021-12-03 VITALS — BP 148/85 | HR 84 | Resp 16 | Ht 65.0 in | Wt 203.0 lb

## 2021-12-03 DIAGNOSIS — N890 Mild vaginal dysplasia: Secondary | ICD-10-CM

## 2021-12-03 NOTE — Progress Notes (Signed)
° °  Subjective:    Patient ID: Theresa Cantrell is a 46 y.o. female presenting with ASCUS neg HPV 2/22  on 12/03/2021  HPI: Referred back by her PCP for abnormal pap in 2/22. Previously with LGSIL, and VAIN1 on vaginal biopsy.  Review of Systems  Constitutional:  Negative for chills and fever.  Respiratory:  Negative for shortness of breath.   Cardiovascular:  Negative for chest pain.  Gastrointestinal:  Negative for abdominal pain, nausea and vomiting.  Genitourinary:  Negative for dysuria.  Skin:  Negative for rash.     Objective:    BP (!) 148/85    Pulse 84    Resp 16    Ht 5\' 5"  (1.651 m)    Wt 203 lb (92.1 kg)    BMI 33.78 kg/m  Physical Exam Exam conducted with a chaperone present.  Constitutional:      General: She is not in acute distress.    Appearance: She is well-developed.  HENT:     Head: Normocephalic and atraumatic.  Eyes:     General: No scleral icterus. Cardiovascular:     Rate and Rhythm: Normal rate.  Pulmonary:     Effort: Pulmonary effort is normal.  Abdominal:     Palpations: Abdomen is soft.  Genitourinary:    Comments: Cuff intact, no lesion Musculoskeletal:     Cervical back: Neck supple.  Skin:    General: Skin is warm and dry.  Neurological:     Mental Status: She is alert and oriented to person, place, and time.        Assessment & Plan:   Problem List Items Addressed This Visit       Unprioritized   VAIN I (vaginal intraepithelial neoplasia grade I) - Primary    Repeat pap since it has been > 1 year.      Relevant Orders   Cytology - PAP( [place])    Return in about 4 weeks (around 12/31/2021) for depending on results.  Donnamae Jude, MD 12/03/2021 1:31 PM

## 2021-12-03 NOTE — Progress Notes (Signed)
Hysterectomy in 1996 due to prolapse uterus

## 2021-12-03 NOTE — Assessment & Plan Note (Signed)
Repeat pap since it has been > 1 year.

## 2021-12-05 LAB — CYTOLOGY - PAP
Comment: NEGATIVE
Diagnosis: UNDETERMINED — AB
High risk HPV: NEGATIVE

## 2021-12-08 ENCOUNTER — Ambulatory Visit (INDEPENDENT_AMBULATORY_CARE_PROVIDER_SITE_OTHER): Payer: PRIVATE HEALTH INSURANCE | Admitting: Licensed Clinical Social Worker

## 2021-12-08 ENCOUNTER — Other Ambulatory Visit: Payer: Self-pay

## 2021-12-08 DIAGNOSIS — F3181 Bipolar II disorder: Secondary | ICD-10-CM

## 2021-12-10 ENCOUNTER — Ambulatory Visit: Payer: PRIVATE HEALTH INSURANCE | Admitting: Registered"

## 2021-12-17 NOTE — Progress Notes (Signed)
° °  THERAPIST PROGRESS NOTE  Session Time: 40 min.  Participation Level: Active  Behavioral Response: CasualAlertDepressed  Type of Therapy: Individual Therapy  Treatment Goals addressed: dep/anx/coping  ProgressTowards Goals: Progressing  Interventions: Solution Focused and Supportive  Summary: Theresa Cantrell is a 46 y.o. female who presents with hx of Bipolar Dis.  Today patient returns for in person session.  Patient last seen September 18, 2021.  Patient canceled her appointment in December.  LCSW assessed for any significant changes since last session.  Patient reports she did get back to work and so far things are going very well.  She states her supervisor and coworkers keep an eye out to make sure she is not lifting something she should not be lifting given her shoulder pain.  Theresa Cantrell advises she did get her MRI done and she has a rotator cuff tear, arthritis, tendinitis, and bursitis in her right shoulder.  Patient states she is doing her best to be careful with activities as she is not able to have another surgery anytime soon.  LCSW assessed for patient's continued cessation of smoking.  Patient reports she has not smoked at all and is more committed than ever not to smoke.  She is able to note the ongoing benefits in her health the longer she is without cigarettes.  LCSW commended her for maintaining her goal of smoking cessation.  Patient reports she is taking medications as prescribed and she is getting restful sleep all night with her medications. Pt feels her mood has been well managed.  LCSW assessed for patient's daughter moving out.  She reports her daughter has not moved out but that she is rarely home since her mother moved in.  Theresa Cantrell states her mother moved in right before Christmas.  She confirms she wanted her mother to move in and has been trying to get her mother to move in but it is much more stressful than she thought it would be.  Her mother is completely independent in  functional status and is driving to [place] to check on her brother/volunteer a couple of times a week.  Patient states the biggest stressor is her "smart remarks" and "having a fit when I leave".  Patient speaks about being gay, about her mother not liking any of her friends and wants patient to stay home with her all the time.  Patient states she chooses not to engage in arguing with her mother and has also let her mother know she will not be changing her schedule of socializing because she moved in. LCSW assisted pt to process thoughts/feelings/reactions and adjustment process. Pt denies other new worries/concerns this date. LCSW reviewed poc including scheduling prior to close of session. Pt states appreciation for care.   Suicidal/Homicidal: Nowithout intent/plan  Therapist Response: Pt receptive to care.  Plan: Return again for next avail appt.  Diagnosis: Bipolar 2 disorder ([place])  Collaboration of Care: Medication Management AEB confirmed pt's next appt for med management for her.  Patient/Guardian was advised Release of Information must be obtained prior to any record release in order to collaborate their care with an outside provider. Patient/Guardian was advised if they have not already done so to contact the registration department to sign all necessary forms in order for [place] to release information regarding their care.   Consent: Patient/Guardian gives verbal consent for treatment and assignment of benefits for services provided during this visit. Patient/Guardian expressed understanding and agreed to proceed.   Hermine Messick, LCSW 12/17/2021

## 2021-12-24 ENCOUNTER — Telehealth (INDEPENDENT_AMBULATORY_CARE_PROVIDER_SITE_OTHER): Payer: PRIVATE HEALTH INSURANCE | Admitting: Psychiatry

## 2021-12-24 ENCOUNTER — Encounter (HOSPITAL_COMMUNITY): Payer: Self-pay | Admitting: Psychiatry

## 2021-12-24 DIAGNOSIS — K219 Gastro-esophageal reflux disease without esophagitis: Secondary | ICD-10-CM | POA: Diagnosis not present

## 2021-12-24 DIAGNOSIS — F4310 Post-traumatic stress disorder, unspecified: Secondary | ICD-10-CM

## 2021-12-24 DIAGNOSIS — F419 Anxiety disorder, unspecified: Secondary | ICD-10-CM | POA: Diagnosis not present

## 2021-12-24 DIAGNOSIS — F3181 Bipolar II disorder: Secondary | ICD-10-CM

## 2021-12-24 MED ORDER — FAMOTIDINE 20 MG PO TABS: 20.0000 mg | ORAL_TABLET | Freq: Two times a day (BID) | ORAL | 2 refills | Status: DC

## 2021-12-24 MED ORDER — TRAZODONE HCL 50 MG PO TABS: 50.0000 mg | ORAL_TABLET | Freq: Every evening | ORAL | 3 refills | Status: DC | PRN

## 2021-12-24 MED ORDER — FLUOXETINE HCL 20 MG PO CAPS: 20.0000 mg | ORAL_CAPSULE | Freq: Every day | ORAL | 3 refills | Status: DC

## 2021-12-24 MED ORDER — PRAZOSIN HCL 1 MG PO CAPS: 1.0000 mg | ORAL_CAPSULE | Freq: Every day | ORAL | 3 refills | Status: DC

## 2021-12-24 MED ORDER — ARIPIPRAZOLE 10 MG PO TABS: 10.0000 mg | ORAL_TABLET | Freq: Every day | ORAL | 3 refills | Status: DC

## 2021-12-24 MED ORDER — HYDROXYZINE HCL 10 MG PO TABS: 10.0000 mg | ORAL_TABLET | Freq: Three times a day (TID) | ORAL | 3 refills | Status: DC | PRN

## 2021-12-24 NOTE — Progress Notes (Signed)
BH MD/PA/NP OP Progress Note Virtual Visit via Telephone Note  I connected with Theresa Cantrell on 12/24/21 at  3:30 PM EST by telephone and verified that I am speaking with the correct person using two identifiers.  Location: Patient: home Provider: Clinic   I discussed the limitations, risks, security and privacy concerns of performing an evaluation and management service by telephone and the availability of in person appointments. I also discussed with the patient that there may be a patient responsible charge related to this service. The patient expressed understanding and agreed to proceed.   I provided 30 minutes of non-face-to-face time during this encounter.       12/24/2021 3:43 PM Theresa Cantrell  MRN:  [id number]  Chief Complaint: "I have been taking my medications and things are good"  HPI: 46 year old female seen today for follow up psychiatric evaluation.  She has a psychiatric history of  Bipolar, depression, anxiety, and PTSD.  She is currently being managed on Abilify 10 mg daily, prazosin 1 mg nightly, trazodone 25 mg to 50 mg as needed sleep, hydroxyzine 10 mg three times daily, and Prozac 20 mg daily.  Patient notes that her medications are effective in managing her psychiatric conditions.  Today she was unable to login virtually so her assessment was done over the phone. During exam she was pleasant, cooperative, and engaged in conversation.  She informed Probation officer that she has been taking her medication and reports that things are good.  She notes her mood is stable and reports that she has minimal anxiety and depression.  Provider conducted GAD-7 patient scored a 1, at her last visit she scored an 8.  Provider also conducted PHQ-9 and patient scored a 0, at her last visit she scored a 10.  She endorses adequate sleep and appetite.  Patient informed Probation officer that she stop smoking cigarettes and has gained over 20 pounds.  Today she denies SI/HI/VH, mania, or paranoia.     Patient informed writer that she continues to have pain in her arms and shoulders due to a torn rotator cuff.  She is followed by PCP who is managing this pain.  Currently she is not prescribed prescription medications however notes that she takes over-the-counter medications regularly.  Patient informed Probation officer that she continues to work at [employer] and finds enjoyment in her job.   Overall patient notes that she is doing well.  No medication changes made today. Patient agreeable to continue all medications as prescribed. No other concerns noted at this time.    Visit Diagnosis:    ICD-10-CM   1. Gastroesophageal reflux disease without esophagitis  K21.9 famotidine (PEPCID) 20 MG tablet    2. Bipolar 2 disorder, major depressive episode (HCC)  F31.81 FLUoxetine (PROZAC) 20 MG capsule    traZODone (DESYREL) 50 MG tablet    3. Anxiety  F41.9 hydrOXYzine (ATARAX) 10 MG tablet    4. PTSD (post-traumatic stress disorder)  F43.10 prazosin (MINIPRESS) 1 MG capsule      Past Psychiatric History:  Bipolar, depression, anxiety, and PTSD.  Past Medical History:  Past Medical History:  Diagnosis Date   Anxiety    Arthritis    Bipolar 1 disorder ([place])    Depression    GERD (gastroesophageal reflux disease)    Hypertension    PTSD (post-traumatic stress disorder)     Past Surgical History:  Procedure Laterality Date   ABDOMINAL HYSTERECTOMY     ANTERIOR CERVICAL DECOMP/DISCECTOMY FUSION N/A 06/06/2021  Procedure: C4-5, C5-6 ANTERIOR CERVICAL DISCECTOMY FUSION, ALLOGRAFT, PLATE;  Surgeon: Marybelle Killings, MD;  Location: [facility];  Service: Orthopedics;  Laterality: N/A;   CHOLECYSTECTOMY     DILATION AND CURETTAGE OF UTERUS     FOOT SURGERY Bilateral    two pins in each foot   JOINT REPLACEMENT Left 2018   partial knee replacement   KNEE SURGERY Left 2018   partial knee replacement   NECK SURGERY  06/06/2021   C4-5, C5-6 ANTERIOR CERVICAL DISCECTOMY FUSION, ALLOGRAFT, PLATE     Family Psychiatric History: Mother Bipolar and depression, father schizophrenia and depression, bipolar.  Family History:  Family History  Problem Relation Age of Onset   Hypertension Mother    Cancer Mother        breast   Anxiety disorder Mother    Hypertension Father    Schizophrenia Father    Bipolar disorder Father    Anxiety disorder Father    Colon cancer Neg Hx    Colon polyps Neg Hx    Esophageal cancer Neg Hx    Rectal cancer Neg Hx    Stomach cancer Neg Hx     Social History:  Social History   Socioeconomic History   Marital status: Single    Spouse name: Not on file   Number of children: 1   Years of education: Not on file   Highest education level: Some college, no degree  Occupational History   Not on file  Tobacco Use   Smoking status: Former    Packs/day: 0.25    Types: Cigarettes   Smokeless tobacco: Never  Vaping Use   Vaping Use: Never used  Substance and Sexual Activity   Alcohol use: Yes    Alcohol/week: 2.0 standard drinks    Types: 1 Glasses of wine, 1 Cans of beer per week   Drug use: No   Sexual activity: Not Currently    Birth control/protection: Surgical  Other Topics Concern   Not on file  Social History Narrative   Not on file   Social Determinants of Health   Financial Resource Strain: Not on file  Food Insecurity: Not on file  Transportation Needs: Not on file  Physical Activity: Not on file  Stress: Not on file  Social Connections: Not on file    Allergies:  Allergies  Allergen Reactions   Amlodipine Swelling    Lower extremity edema.   Hydrochlorothiazide Other (See Comments)    Cough   Lamotrigine Hives    Metabolic Disorder Labs: Lab Results  Component Value Date   HGBA1C 5.4 10/16/2021   No results found for: PROLACTIN Lab Results  Component Value Date   CHOL 165 11/07/2020   TRIG 84 11/07/2020   HDL 63 11/07/2020   CHOLHDL 2.6 11/07/2020   LDLCALC 86 11/07/2020   LDLCALC 63 03/31/2018   Lab  Results  Component Value Date   TSH 0.484 03/31/2018   TSH 1.501 02/07/2014    Therapeutic Level Labs: No results found for: LITHIUM No results found for: VALPROATE No components found for:  CBMZ  Current Medications: Current Outpatient Medications  Medication Sig Dispense Refill   ARIPiprazole (ABILIFY) 10 MG tablet Take 1 tablet (10 mg total) by mouth daily. 30 tablet 3   Cholecalciferol (VITAMIN D) 50 MCG (2000 UT) tablet Take 2,000 Units by mouth daily.     famotidine (PEPCID) 20 MG tablet Take 1 tablet (20 mg total) by mouth 2 (two) times daily. To reduce stomach acid  60 tablet 2   FLUoxetine (PROZAC) 20 MG capsule Take 1 capsule (20 mg total) by mouth daily. 30 capsule 3   furosemide (LASIX) 20 MG tablet 1 tab PO Q Mon/Wed/Friday 12 tablet 3   hydrOXYzine (ATARAX) 10 MG tablet Take 1 tablet (10 mg total) by mouth 3 (three) times daily as needed. 90 tablet 3   prazosin (MINIPRESS) 1 MG capsule Take 1 capsule (1 mg total) by mouth at bedtime. 30 capsule 3   traZODone (DESYREL) 50 MG tablet Take 1 tablet (50 mg total) by mouth at bedtime as needed for sleep. 30 tablet 3   valsartan (DIOVAN) 40 MG tablet Take 1 tablet (40 mg total) by mouth daily. STOP LISINOPRIL 30 tablet 3   No current facility-administered medications for this visit.     Musculoskeletal: Strength & Muscle Tone:  Unable to assess due to telephone visit Gait & Station:  Unable to assess due to telehphone visit Patient leans: N/A  Psychiatric Specialty Exam: Review of Systems  There were no vitals taken for this visit.There is no height or weight on file to calculate BMI.  General Appearance:  Unable to assess due to telephone visit  Eye Contact:   Unable to assess due to telephone visit  Speech:  Clear and Coherent and Normal Rate  Volume:  Normal  Mood:  Euthymic  Affect:  Congruent  Thought Process:  Coherent, Goal Directed and Linear  Orientation:  Full (Time, Place, and Person)  Thought Content: WDL  and Logical   Suicidal Thoughts:  No  Homicidal Thoughts:  No  Memory:  Immediate;   Good Recent;   Good Remote;   Good  Judgement:  Good  Insight:  Good  Psychomotor Activity:  Normal  Concentration:  Concentration: Good and Attention Span: Good  Recall:  Good  Fund of Knowledge: Good  Language: Good  Akathisia:  No  Handed:  Right  AIMS (if indicated): Not done  Assets:  Communication Skills Desire for Improvement Financial Resources/Insurance Housing Social Support  ADL's:  Intact  Cognition: WNL  Sleep:  Good   Screenings: GAD-7    Flowsheet Row Video Visit from 12/24/2021 in [facility] Office Visit from 10/16/2021 in [facility] Video Visit [facility] Office Visit from 06/24/2021 in [facility] Video Visit from [facility]/03/2021 in [facility]  Total GAD-7 Score 1 10 8 10 6       PHQ2-9    Flowsheet Row Video Visit from 12/24/2021 in [facility] Office Visit from 10/16/2021 in [facility] Video Visit from 10/06/2021 in [facility] Office Visit from 06/24/2021 in [facility] Office Visit from 06/19/2021 in [facility]  PHQ-2 Total Score 0 2 3 3 4   PHQ-9 Total Score 0 9 10 4 10       Flowsheet Row Video Visit from 10/06/2021 in [facility] ED from 10/01/2021 in [facility] [place] [facility] from 05/28/2021 in [facility] PREADMISSION TESTING  C-SSRS RISK CATEGORY No Risk No Risk No Risk        Assessment and Plan: Patient reports that she is doing well on her current medication regimen.  No medication changes made today. Patient agreeable to continue medications as prescribed.  1. Bipolar  2 disorder,  major depressive episode (HCC)  Continue- ARIPiprazole 10 MG TABS; Take 10 mg by mouth daily.  Dispense: 30 tablet; Refill: 3 Continue- FLUoxetine (PROZAC) 20 MG capsule; Take 1 capsule (20 mg total) by mouth daily.  Dispense: 30 capsule; Refill: 3 Continue- traZODone (DESYREL) 50 MG tablet; Take 1 tablet (50 mg total) by mouth at bedtime as needed for sleep.  Dispense: 30 tablet; Refill: 3  2. Anxiety  Continue- hydrOXYzine (ATARAX/VISTARIL) 10 MG tablet; Take 1 tablet (10 mg total) by mouth 3 (three) times daily as needed.  Dispense: 90 tablet; Refill: 3  3. PTSD (post-traumatic stress disorder)  Continue- prazosin (MINIPRESS) 1 MG capsule; Take 1 capsule (1 mg total) by mouth at bedtime.  Dispense: 30 capsule; Refill: 3   Follow-up in 3 months Follow-up with therapy  Salley Slaughter, NP 12/24/2021, 3:43 PM

## 2022-01-14 ENCOUNTER — Encounter: Payer: Self-pay | Admitting: Internal Medicine

## 2022-01-21 ENCOUNTER — Encounter: Payer: Self-pay | Admitting: Internal Medicine

## 2022-01-21 NOTE — Progress Notes (Signed)
Received mammogram report that was done 01/14/2022.  Normal mammogram. ?

## 2022-02-11 ENCOUNTER — Telehealth (HOSPITAL_COMMUNITY): Payer: Self-pay | Admitting: Licensed Clinical Social Worker

## 2022-02-11 ENCOUNTER — Ambulatory Visit (INDEPENDENT_AMBULATORY_CARE_PROVIDER_SITE_OTHER): Payer: PRIVATE HEALTH INSURANCE | Admitting: Licensed Clinical Social Worker

## 2022-02-11 DIAGNOSIS — Z8659 Personal history of other mental and behavioral disorders: Secondary | ICD-10-CM

## 2022-02-11 DIAGNOSIS — F4323 Adjustment disorder with mixed anxiety and depressed mood: Secondary | ICD-10-CM | POA: Insufficient documentation

## 2022-02-11 NOTE — Progress Notes (Signed)
Virtual Visit via Video Note ? ?I connected with Noreene Filbert on 02/11/22 at  3:00 PM EDT by a video enabled telemedicine application and verified that I am speaking with the correct person using two identifiers. ? ?Location: ?Patient: pt's car  ?Provider: clinical office ?  ?I discussed the limitations of evaluation and management by telemedicine and the availability of in person appointments. The patient expressed understanding and agreed to proceed. ?  ?I discussed the assessment and treatment plan with the patient. The patient was provided an opportunity to ask questions and all were answered. The patient agreed with the plan and demonstrated an understanding of the instructions. ?  ?The patient was advised to call back or seek an in-person evaluation if the symptoms worsen or if the condition fails to improve as anticipated. ? ?I provided 25 minutes of non-face-to-face time during this encounter. ? ? ?Heron Nay, LCSWA ? ? ? ?THERAPIST PROGRESS NOTE ? ?Session Time: 25 minutes ? ?Participation Level: Active ? ?Behavioral Response: CasualAlertEuthymic ? ?Type of Therapy: Individual Therapy ? ?Treatment Goals addressed: identify tx goals ? ?ProgressTowards Goals: Initial ? ?Interventions: Strength-based and Supportive ? ?Summary: Theresa Cantrell is a 46 y.o. female who presents for initial visit with this cln due to previous therapist resigning. Pt is noted to be in a car and appears stationary. Pt confirms she pulled over to engage in session and cln relayed that she cannot be driving during the session and pt verbalized understanding. Pt reports anxiety and depression symptoms. Some panic attacks, maybe once a month. Sleep is "pretty good" appetite is "great." Experiences racing thoughts. Works in a [employer]. When asked about what she was working through with previous therapist, pt reported working on conflict with her mother. She reports she returned to work in November after recovering from spine  surgery and is dealing with shoulder issues due to having a possible torn rotator cuff. She states she may need surgery but that she is trying to tolerate the pain and avoid surgery for as long as possible. Has had a total of 12 surgeries, mostly orthopedic. Supports are daughter and best friend. Daughter is 60yo and lives with her, currently saving to move out. States she feels comfortable about living alone. Likes to [employer], travel, and tend to plants. Session terminated after 25 minutes due to technical difficulties. Pt stated that her phone was dying prior to losing the connection. ? ?Suicidal/Homicidal: Nowithout intent/plan ? ?Therapist Response: Cln introduced self and asked pt to identify pertinent background information, stressors, current symptoms, and treatment goals. Cln inquired about what pt was previously processing in therapy and was noted to be reluctant to provide details, thus cln focused on building therapeutic alliance. Cln asked pt about her job, family, hobbies/interests, and current stressors related to physical health conditions. Unable to complete PHQ 2&9 and GAD-7 due to losing connection with pt and being unable to reconnect. Cln ended session and rebooted in an attempt to assist. Cln called pt to try to assist but was unsuccessful in reaching pt. Cln left VM informing her that cln will remain on telemedicine platform in case she is able to reconnect and also she requested pt call office to schedule next appt. Cln remained on [facility] until 4:00.  ? ?Plan: Return again in 3-5 weeks. ? ?Diagnosis: History of bipolar disorder ? ?Adjustment disorder with mixed anxiety and depressed mood ? ?Collaboration of Care: Other chart review of previous therapist's notes. ? ?Patient/Guardian was advised Release of Information must  be obtained prior to any record release in order to collaborate their care with an outside provider. Patient/Guardian was advised if they have not already done so to contact  the registration department to sign all necessary forms in order for [place] to release information regarding their care.  ? ?Consent: Patient/Guardian gives verbal consent for treatment and assignment of benefits for services provided during this visit. Patient/Guardian expressed understanding and agreed to proceed.  ? ?Heron Nay, LCSWA ?02/11/2022 ? ?

## 2022-02-16 ENCOUNTER — Ambulatory Visit: Payer: PRIVATE HEALTH INSURANCE | Admitting: Internal Medicine

## 2022-02-18 ENCOUNTER — Ambulatory Visit (HOSPITAL_COMMUNITY): Payer: PRIVATE HEALTH INSURANCE | Admitting: Licensed Clinical Social Worker

## 2022-02-23 NOTE — Telephone Encounter (Signed)
See call intake 

## 2022-02-24 ENCOUNTER — Encounter: Payer: Self-pay | Admitting: Internal Medicine

## 2022-02-24 ENCOUNTER — Ambulatory Visit: Payer: PRIVATE HEALTH INSURANCE | Attending: Internal Medicine | Admitting: Internal Medicine

## 2022-02-24 VITALS — BP 143/93 | HR 71 | Temp 97.9°F | Resp 16 | Ht 65.0 in | Wt 204.0 lb

## 2022-02-24 DIAGNOSIS — I10 Essential (primary) hypertension: Secondary | ICD-10-CM | POA: Diagnosis not present

## 2022-02-24 DIAGNOSIS — M545 Low back pain, unspecified: Secondary | ICD-10-CM | POA: Diagnosis not present

## 2022-02-24 MED ORDER — MELOXICAM 15 MG PO TABS: 15.0000 mg | ORAL_TABLET | Freq: Every day | ORAL | 0 refills | Status: DC

## 2022-02-24 MED ORDER — CYCLOBENZAPRINE HCL 5 MG PO TABS: 5.0000 mg | ORAL_TABLET | Freq: Every day | ORAL | 1 refills | Status: DC | PRN

## 2022-02-24 NOTE — Progress Notes (Signed)
? ? ?Patient ID: Theresa Cantrell, female    DOB: [birth year]  MRN: [id number] ? ?CC: lower back pain ? ? ?Subjective: ?Theresa Cantrell is a 46 y.o. female who presents for back pain ?Her concerns today include:  ?Patient with history of HTN, former tob dep, GERD, obesity,  PTSD, Bipolar 2,  CTS RT hand, family history of breast cancer in her mother. ? ?HTN: compliant with Lasix and Diovan ?Checks BP 2-3x a wk.  Forgot to bring log but thinks it is normal. ?She limits salt in the foods. ?Denies chest pains or shortness of breath. ? ?C/o pain across lower back both sides x a few mths ?No initiating factors. ?Initially off and on then constant for past 2 wks.  ?No radiation, numbness/tingling/leg weakness, incontinence, groin anesthesia, fever.  Rates pain 7/10.  ?Worse with prolong sitting and when lying down.    ?Takes Ibuprofen up to 800 mg 2-3x/day. Does not help much.  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  Works 5 days a wk.  ? ? ?Patient Active Problem List  ? Diagnosis Date Noted  ? Adjustment disorder with mixed anxiety and depressed mood 02/11/2022  ? Rotator cuff tendinitis, right 11/14/2021  ? S/P cervical spinal fusion 06/17/2021  ? Spinal stenosis of cervical region 04/02/2021  ? Protrusion of cervical intervertebral disc 04/02/2021  ? COVID-19 vaccine series not completed 12/20/2020  ? Influenza vaccine refused 11/07/2020  ? Essential hypertension 11/07/2020  ? Obesity (BMI 30.0-34.9) 11/07/2020  ? Tobacco dependence 11/07/2020  ? Gastroesophageal reflux disease without esophagitis 11/07/2020  ? History of abnormal cervical Pap smear 11/07/2020  ? History of bipolar disorder 11/07/2020  ? Bipolar 2 disorder, major depressive episode ([place]) 06/12/2020  ? PTSD (post-traumatic stress disorder) 06/12/2020  ? Chronic pain of left knee 10/05/2019  ? 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  ? Neuropathy 02/07/2014  ? Hot flashes 02/07/2014  ?  Weight loss 02/07/2014  ? Anxiety   ?  ? ?Current Outpatient Medications on File Prior to Visit  ?Medication Sig Dispense Refill  ? ARIPiprazole (ABILIFY) 10 MG tablet Take 1 tablet (10 mg total) by mouth daily. 30 tablet 3  ? Cholecalciferol (VITAMIN D) 50 MCG (2000 UT) tablet Take 2,000 Units by mouth daily.    ? FLUoxetine (PROZAC) 20 MG capsule Take 1 capsule (20 mg total) by mouth daily. 30 capsule 3  ? furosemide (LASIX) 20 MG tablet 1 tab PO Q Mon/Wed/Friday 12 tablet 3  ? hydrOXYzine (ATARAX) 10 MG tablet Take 1 tablet (10 mg total) by mouth 3 (three) times daily as needed. 90 tablet 3  ? prazosin (MINIPRESS) 1 MG capsule Take 1 capsule (1 mg total) by mouth at bedtime. 30 capsule 3  ? traZODone (DESYREL) 50 MG tablet Take 1 tablet (50 mg total) by mouth at bedtime as needed for sleep. 30 tablet 3  ? valsartan (DIOVAN) 40 MG tablet Take 1 tablet (40 mg total) by mouth daily. STOP LISINOPRIL 30 tablet 3  ? ?No current facility-administered medications on file prior to visit.  ? ? ?Allergies  ?Allergen Reactions  ? Amlodipine Swelling  ?  Lower extremity edema.  ? Hydrochlorothiazide Other (See Comments)  ?  Cough  ? Lamotrigine Hives  ? ? ?Social History  ? ?Socioeconomic History  ? Marital status: Single  ?  Spouse name: Not on file  ? Number of children: 1  ? Years of education: Not  on file  ? Highest education level: Some college, no degree  ?Occupational History  ? Not on file  ?Tobacco Use  ? Smoking status: Former  ?  Packs/day: 0.25  ?  Types: Cigarettes  ? Smokeless tobacco: Never  ?Vaping Use  ? Vaping Use: Never used  ?Substance and Sexual Activity  ? Alcohol use: Yes  ?  Alcohol/week: 2.0 standard drinks  ?  Types: 1 Glasses of wine, 1 Cans of beer per week  ? Drug use: No  ? Sexual activity: Not Currently  ?  Birth control/protection: Surgical  ?Other Topics Concern  ? Not on file  ?Social History Narrative  ? Not on file  ? ?Social Determinants of Health  ? ?Financial Resource Strain: Not on file   ?Food Insecurity: Not on file  ?Transportation Needs: Not on file  ?Physical Activity: Not on file  ?Stress: Not on file  ?Social Connections: Not on file  ?Intimate Partner Violence: Not on file  ? ? ?Family History  ?Problem Relation Age of Onset  ? Hypertension Mother   ? Cancer Mother   ?     breast  ? Anxiety disorder Mother   ? Hypertension Father   ? Schizophrenia Father   ? Bipolar disorder Father   ? Anxiety disorder Father   ? Colon cancer Neg Hx   ? Colon polyps Neg Hx   ? Esophageal cancer Neg Hx   ? Rectal cancer Neg Hx   ? Stomach cancer Neg Hx   ? ? ?Past Surgical History:  ?Procedure Laterality Date  ? ABDOMINAL HYSTERECTOMY    ? ANTERIOR CERVICAL DECOMP/DISCECTOMY FUSION N/A 06/06/2021  ? Procedure: C4-5, C5-6 ANTERIOR CERVICAL DISCECTOMY FUSION, ALLOGRAFT, PLATE;  Surgeon: Marybelle Killings, MD;  Location: [facility];  Service: Orthopedics;  Laterality: N/A;  ? CHOLECYSTECTOMY    ? DILATION AND CURETTAGE OF UTERUS    ? FOOT SURGERY Bilateral   ? two pins in each foot  ? JOINT REPLACEMENT Left 2018  ? partial knee replacement  ? KNEE SURGERY Left 2018  ? partial knee replacement  ? NECK SURGERY  06/06/2021  ? C4-5, C5-6 ANTERIOR CERVICAL DISCECTOMY FUSION, ALLOGRAFT, PLATE  ? ? ?ROS: ?Review of Systems ?Negative except as stated above ? ?PHYSICAL EXAM: ?BP (!) 143/93   Pulse 71   Temp 97.9 ?F (36.6 ?C) (Oral)   Resp 16   Ht '5\' 5"'$  (1.651 m)   Wt 204 lb (92.5 kg)   SpO2 100%   BMI 33.95 kg/m?   ?Physical Exam ? ?General appearance - alert, well appearing, middle-age African-American female and in no distress ?Mental status - normal mood, behavior, speech, dress, motor activity, and thought processes ?Chest - clear to auscultation, no wheezes, rales or rhonchi, symmetric air entry ?Heart - normal rate, regular rhythm, normal S1, S2, no murmurs, rubs, clicks or gallops ?Neurological -power lower extremities 5/5 bilaterally.  Gross sensation intact in the lower extremities.  Straight leg raise  negative. ?Musculoskeletal -no tenderness on palpation of the lumbar spine or surrounding paraspinal muscles. ? ? ? ?  Latest Ref Rng & Units 10/01/2021  ?  5:23 PM 05/28/2021  ?  3:30 PM 11/07/2020  ?  4:10 PM  ?CMP  ?Glucose 65 - 99 mg/dL 86   64   100    ?BUN 7 - 25 mg/dL '21   24   17    '$ ?Creatinine 0.50 - 0.99 mg/dL 1.12   1.03   1.00    ?  Sodium 135 - 146 mmol/L 143   136   147    ?Potassium 3.5 - 5.3 mmol/L 4.3   3.6   4.2    ?Chloride 98 - 110 mmol/L 107   104   107    ?CO2 20 - 32 mmol/L '29   28   28    '$ ?Calcium 8.6 - 10.2 mg/dL 9.5   9.3   9.9    ?Total Protein 6.1 - 8.1 g/dL 6.6   6.6   6.9    ?Total Bilirubin 0.2 - 1.2 mg/dL 0.3   0.3   <0.2    ?Alkaline Phos 38 - 126 U/L  87   112    ?AST 10 - 35 U/L '22   21   14    '$ ?ALT 6 - 29 U/L '19   10   14    '$ ? ?Lipid Panel  ?   ?Component Value Date/Time  ? CHOL 165 11/07/2020 1610  ? TRIG 84 11/07/2020 1610  ? HDL 63 11/07/2020 1610  ? CHOLHDL 2.6 11/07/2020 1610  ? [place] 86 11/07/2020 1610  ? ? ?CBC ?   ?Component Value Date/Time  ? WBC 6.9 05/28/2021 1530  ? RBC 4.39 05/28/2021 1530  ? HGB 13.2 05/28/2021 1530  ? HGB 14.2 11/07/2020 1610  ? HCT 40.8 05/28/2021 1530  ? HCT 42.3 11/07/2020 1610  ? PLT 240 05/28/2021 1530  ? PLT 250 11/07/2020 1610  ? MCV 92.9 05/28/2021 1530  ? MCV 91 11/07/2020 1610  ? MCH 30.1 05/28/2021 1530  ? MCHC 32.4 05/28/2021 1530  ? RDW 13.3 05/28/2021 1530  ? RDW 12.5 11/07/2020 1610  ? ? ?ASSESSMENT AND PLAN: ?1. Acute bilateral low back pain without sciatica ?-Differential diagnosis include arthritis made worse by repetitive lifting, degenerative disc with pain made worse by repetitive lifting or chronic muscle strain ?-Advised use of heating pad twice a day for 10 to 15 minutes ?Try to avoid heavy lifting.  She is agreeable to having work restriction of no lifting more than 15 pounds for the next month. ?Stop ibuprofen.  Will give trial of meloxicam instead.  Advised of risks of bleeding with meloxicam in combination of with Prozac.   Went over signs and symptoms to monitor for including GERD symptoms.  Let me know if this occurs. ?Flexeril to use as needed.  Advised that the medication can cause drowsiness. ?- meloxicam (MOBIC) 15 MG tablet; Take 1 tab

## 2022-02-24 NOTE — Progress Notes (Signed)
Back pain x 2 mos.  ?Takes OTC Ibuprofen ?Constant, 7/10  ?

## 2022-03-10 ENCOUNTER — Encounter (HOSPITAL_COMMUNITY): Payer: Self-pay

## 2022-03-10 ENCOUNTER — Telehealth (HOSPITAL_COMMUNITY): Payer: PRIVATE HEALTH INSURANCE | Admitting: Psychiatry

## 2022-03-10 NOTE — Progress Notes (Unsigned)
? ?  S:    ?LENETTE RAU is a 46 y.o. female who presents for hypertension evaluation, education, and management. PMH is significant for HTN, bipolar disorder, PTSD, tobacco use. Patient was referred and last seen by Primary Care Provider, Dr. Wynetta Emery, on 02/24/22 when BP was 143/93.   ? ?Today, patient arrives in *** spirits and presents {w-w/o:315700} assistance. *** Denies dizziness, headache, blurred vision, swelling.  ?***back pain, was using ibu now on meloxicam. Has tried CCB and thiazide. Increase valsartan? Last BMET 09/2021 Scr up slightly 1.12, K 4.3 ? ?Family/Social history:  ?-Former smoker ?-HTN in mother, father ? ?Medication adherence *** . Patient has *** taken BP medications today.  ? ?Current antihypertensives include: valsartan 40 mg daily, prasozin 1 mg nightly (PSTD), furosemide 20 mg qMWF ? ?Antihypertensives tried in the past include: amlodipine (LEE), HCTZ (cough) ? ?Reported home BP readings: *** ? ?Patient reported dietary habits: Eats *** meals/day ?Breakfast: *** ?Lunch: *** ?Dinner: *** ?Snacks: *** ?Drinks: *** ? ?Patient-reported exercise habits: *** ? ?ASCVD risk factors include: HTN, tobacco use ? ? ?O:  ? ?Last 3 Office BP readings: ?BP Readings from Last 3 Encounters:  ?02/24/22 (!) 143/93  ?12/03/21 (!) 148/85  ?11/11/21 (!) 146/85  ? ? ?BMET ?   ?Component Value Date/Time  ? NA 143 10/01/2021 1723  ? NA 147 (H) 11/07/2020 1610  ? K 4.3 10/01/2021 1723  ? CL 107 10/01/2021 1723  ? CO2 29 10/01/2021 1723  ? GLUCOSE 86 10/01/2021 1723  ? BUN 21 10/01/2021 1723  ? BUN 17 11/07/2020 1610  ? CREATININE 1.12 (H) 10/01/2021 1723  ? CALCIUM 9.5 10/01/2021 1723  ? GFRNONAA >60 05/28/2021 1530  ? GFRAA 78 11/07/2020 1610  ? ? ?Renal function: ?CrCl cannot be calculated (Patient's most recent lab result is older than the maximum 21 days allowed.). ? ?Clinical ASCVD: No  ?The 10-year ASCVD risk score (Arnett DK, et al., 2019) is: 3.2% ?  Values used to calculate the score: ?    Age: 69  years ?    Sex: Female ?    Is Non-Hispanic African American: Yes ?    Diabetic: No ?    Tobacco smoker: No ?    Systolic Blood Pressure: 016 mmHg ?    Is BP treated: Yes ?    HDL Cholesterol: 63 mg/dL ?    Total Cholesterol: 165 mg/dL ? ?A/P: ?Hypertension diagnosed *** currently *** on current medications. BP goal < 130/80 *** mmHg. Medication adherence appears ***. Control is suboptimal due to ***.  ?-{Meds adjust:18428} ***.  ?-Patient educated on purpose, proper use, and potential adverse effects of ***.  ?-F/u labs ordered - *** ?-Counseled on lifestyle modifications for blood pressure control including reduced dietary sodium, increased exercise, adequate sleep. ?-Encouraged patient to check BP at home and bring log of readings to next visit. Counseled on proper use of home BP cuff.  ? ?Results reviewed and written information provided. Patient verbalized understanding of treatment plan. Total time in face-to-face counseling *** minutes.  ? ?F/u clinic visit in ***.  ?

## 2022-03-16 ENCOUNTER — Ambulatory Visit: Payer: PRIVATE HEALTH INSURANCE | Admitting: Pharmacist

## 2022-03-17 ENCOUNTER — Ambulatory Visit: Payer: PRIVATE HEALTH INSURANCE | Attending: Internal Medicine | Admitting: Pharmacist

## 2022-03-17 ENCOUNTER — Encounter: Payer: Self-pay | Admitting: Pharmacist

## 2022-03-17 VITALS — BP 127/83

## 2022-03-17 DIAGNOSIS — I10 Essential (primary) hypertension: Secondary | ICD-10-CM

## 2022-03-17 NOTE — Progress Notes (Signed)
? ?  S:    ? ?No chief complaint on file. ? ? ?Theresa Cantrell is a 46 y.o. female who presents for hypertension evaluation, education, and management. PMH is significant for HTN, former tob dep, GERD, obesity,  PTSD, Bipolar 2,  CTS RT hand, family history of breast cancer in her mother. Patient was referred and last seen by Primary Care Provider, Dr. Wynetta Emery, on 02/24/2022. BP was elevated at that visit, but pt reported better BP control at home.  ? ?Today, patient arrives in good spirits and presents without assistance. Denies dizziness, headache, blurred vision, swelling.  ? ?Patient reports hypertension longstanding.  ? ?Family/Social history:  ?Fhx: HTN, GAD ?Tobacco: former smoker ?Alcohol: none reported ? ?Medication adherence reported. Patient has taken BP medications today.  ? ?Current antihypertensives include: valsartan 40 mg daily. ?**Pt takes furosemide 20 mg daily MWF for fluid.  ?**Also takes prazosin 1 mg in the evening but not for HTN  ? ?Antihypertensives tried in the past include: HCTZ (cough?), amlodipine (LE edema) ? ?Reported home BP readings:  ?- Reports wide range of 130s-160s/80s-90s.  ?- No log with her today.  ? ?Patient reported dietary habits:  ?- Recently eliminated salt from her diet. Uses Mrs. Dash and other sodium-free seasonings when cooking.  ?- Drinks soda daily  ? ?Patient-reported exercise habits:  ?- None outside of work. 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  ? ?O:  ?Vitals:  ? 03/17/22 1634  ?BP: 127/83  ? ? ?Last 3 Office BP readings: ?BP Readings from Last 3 Encounters:  ?03/17/22 127/83  ?02/24/22 (!) 143/93  ?12/03/21 (!) 148/85  ? ? ?BMET ?   ?Component Value Date/Time  ? NA 143 10/01/2021 1723  ? NA 147 (H) 11/07/2020 1610  ? K 4.3 10/01/2021 1723  ? CL 107 10/01/2021 1723  ? CO2 29 10/01/2021 1723  ? GLUCOSE 86 10/01/2021 1723  ? BUN 21 10/01/2021 1723  ? BUN 17 11/07/2020 1610  ? CREATININE 1.12 (H) 10/01/2021 1723  ? CALCIUM 9.5 10/01/2021 1723  ?  GFRNONAA >60 05/28/2021 1530  ? GFRAA 78 11/07/2020 1610  ? ? ?Renal function: ?CrCl cannot be calculated (Patient's most recent lab result is older than the maximum 21 days allowed.). ? ?Clinical ASCVD: No  ?The 10-year ASCVD risk score (Arnett DK, et al., 2019) is: 1.9% ?  Values used to calculate the score: ?    Age: 39 years ?    Sex: Female ?    Is Non-Hispanic African American: Yes ?    Diabetic: No ?    Tobacco smoker: No ?    Systolic Blood Pressure: 259 mmHg ?    Is BP treated: Yes ?    HDL Cholesterol: 63 mg/dL ?    Total Cholesterol: 165 mg/dL ? ?A/P: ?Hypertension longstanding currently at goal on current medications. BP goal < 130/80 mmHg. Medication adherence appears appropriate.  ?-Continued current regimen.  ?-F/u labs ordered - none ?-Counseled on lifestyle modifications for blood pressure control including reduced dietary sodium, increased exercise, adequate sleep. ?-Encouraged patient to check BP at home and bring log of readings to next visit. Counseled on proper use of home BP cuff.  ? ?Results reviewed and written information provided. Patient verbalized understanding of treatment plan. Total time in face-to-face counseling 30 minutes.  ? ?F/u clinic visit in 1 month.  ? ?Benard Halsted, PharmD, BCACP, CPP ?Clinical Pharmacist ?[place] ?[phone] ? ? ?

## 2022-03-19 ENCOUNTER — Ambulatory Visit (HOSPITAL_COMMUNITY): Payer: Self-pay | Admitting: Licensed Clinical Social Worker

## 2022-04-21 ENCOUNTER — Ambulatory Visit: Payer: PRIVATE HEALTH INSURANCE | Attending: Internal Medicine | Admitting: Pharmacist

## 2022-04-21 ENCOUNTER — Encounter: Payer: Self-pay | Admitting: Pharmacist

## 2022-04-21 VITALS — BP 128/86 | HR 66

## 2022-04-21 DIAGNOSIS — I10 Essential (primary) hypertension: Secondary | ICD-10-CM

## 2022-04-21 DIAGNOSIS — M545 Low back pain, unspecified: Secondary | ICD-10-CM | POA: Diagnosis not present

## 2022-04-21 MED ORDER — FUROSEMIDE 20 MG PO TABS: ORAL_TABLET | ORAL | 3 refills | Status: DC

## 2022-04-21 MED ORDER — MELOXICAM 15 MG PO TABS: 15.0000 mg | ORAL_TABLET | Freq: Every day | ORAL | 0 refills | Status: DC

## 2022-04-21 NOTE — Progress Notes (Cosign Needed)
S:     No chief complaint on file.   Theresa Cantrell is a 46 y.o. female who presents for hypertension evaluation, education, and management. PMH is significant for HTN, former tob dep, GERD, obesity,  PTSD, Bipolar 2,  CTS RT hand, family history of breast cancer in her mother. Patient was referred and last seen by Primary Care Provider, Dr. Wynetta Emery, on 02/24/2022. BP was elevated at that visit, but pt reported better BP control at home. I saw her on 03/17/22 and BP was good.  Today, patient arrives in good spirits and presents without assistance. Denies dizziness, headache, blurred vision, swelling.   Patient reports hypertension longstanding.   Family/Social history:  Fhx: HTN, GAD Tobacco: former smoker Alcohol: none reported  Medication adherence reported. Patient has taken BP medications today.   Current antihypertensives include: valsartan 40 mg daily. **Pt takes furosemide 20 mg daily MWF for fluid.  **Also takes prazosin 1 mg in the evening but not for HTN   Antihypertensives tried in the past include: HCTZ (cough?), amlodipine (LE edema)  Reported home BP readings:  - Reports wide range of 130s-160s/80s-90s.  - No log with her today.   Patient reported dietary habits:  - Recently eliminated salt from her diet. Uses Mrs. Dash and other sodium-free seasonings when cooking.  - Drinks soda daily   Patient-reported exercise habits:  - None outside of work. 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   O:  Vitals:   04/21/22 1534  BP: 128/86  Pulse: 66     Last 3 Office BP readings: BP Readings from Last 3 Encounters:  04/21/22 128/86  03/17/22 127/83  02/24/22 (!) 143/93    BMET    Component Value Date/Time   NA 143 10/01/2021 1723   NA 147 (H) 11/07/2020 1610   K 4.3 10/01/2021 1723   CL 107 10/01/2021 1723   CO2 29 10/01/2021 1723   GLUCOSE 86 10/01/2021 1723   BUN 21 10/01/2021 1723   BUN 17 11/07/2020 1610   CREATININE 1.12 (H)  10/01/2021 1723   CALCIUM 9.5 10/01/2021 1723   GFRNONAA >60 05/28/2021 1530   GFRAA 78 11/07/2020 1610    Renal function: CrCl cannot be calculated (Patient's most recent lab result is older than the maximum 21 days allowed.).  Clinical ASCVD: No  The 10-year ASCVD risk score (Arnett DK, et al., 2019) is: 2%   Values used to calculate the score:     Age: 45 years     Sex: Female     Is Non-Hispanic African American: Yes     Diabetic: No     Tobacco smoker: No     Systolic Blood Pressure: 735 mmHg     Is BP treated: Yes     HDL Cholesterol: 63 mg/dL     Total Cholesterol: 165 mg/dL  A/P: Hypertension longstanding currently close to goal on current medications. BP goal < 130/80 mmHg. Medication adherence appears appropriate.  -Continued current regimen.  -F/u labs ordered - CMP14+eGFR -Counseled on lifestyle modifications for blood pressure control including reduced dietary sodium, increased exercise, adequate sleep. -Encouraged patient to check BP at home and bring log of readings to next visit. Counseled on proper use of home BP cuff.   Results reviewed and written information provided. Patient verbalized understanding of treatment plan. Total time in face-to-face counseling 30 minutes.   F/u clinic visit in 1 month with PCP.  Benard Halsted, PharmD, BCACP, CPP Clinical Pharmacist  [place] [phone]

## 2022-04-22 LAB — CMP14+EGFR
ALT: 11 IU/L (ref 0–32)
AST: 16 IU/L (ref 0–40)
Albumin/Globulin Ratio: 1.7 (ref 1.2–2.2)
Albumin: 4.3 g/dL (ref 3.8–4.8)
Alkaline Phosphatase: 94 IU/L (ref 44–121)
BUN/Creatinine Ratio: 20 (ref 9–23)
BUN: 17 mg/dL (ref 6–24)
Bilirubin Total: 0.2 mg/dL (ref 0.0–1.2)
CO2: 26 mmol/L (ref 20–29)
Calcium: 9.1 mg/dL (ref 8.7–10.2)
Chloride: 103 mmol/L (ref 96–106)
Creatinine, Ser: 0.87 mg/dL (ref 0.57–1.00)
Globulin, Total: 2.6 g/dL (ref 1.5–4.5)
Glucose: 82 mg/dL (ref 70–99)
Potassium: 4.5 mmol/L (ref 3.5–5.2)
Sodium: 138 mmol/L (ref 134–144)
Total Protein: 6.9 g/dL (ref 6.0–8.5)
eGFR: 82 mL/min/{1.73_m2} (ref >59)

## 2022-04-27 ENCOUNTER — Other Ambulatory Visit: Payer: Self-pay | Admitting: Internal Medicine

## 2022-05-13 ENCOUNTER — Other Ambulatory Visit: Payer: Self-pay | Admitting: Internal Medicine

## 2022-05-13 DIAGNOSIS — M545 Low back pain, unspecified: Secondary | ICD-10-CM

## 2022-05-19 ENCOUNTER — Other Ambulatory Visit: Payer: Self-pay | Admitting: Internal Medicine

## 2022-05-28 ENCOUNTER — Ambulatory Visit: Payer: PRIVATE HEALTH INSURANCE | Admitting: Internal Medicine

## 2022-06-02 ENCOUNTER — Other Ambulatory Visit: Payer: Self-pay | Admitting: Internal Medicine

## 2022-06-03 MED ORDER — VALSARTAN 40 MG PO TABS: 40.0000 mg | ORAL_TABLET | Freq: Every day | ORAL | 1 refills | Status: DC

## 2022-06-23 ENCOUNTER — Ambulatory Visit: Payer: PRIVATE HEALTH INSURANCE | Admitting: Internal Medicine

## 2022-07-20 ENCOUNTER — Telehealth: Payer: Self-pay | Admitting: Emergency Medicine

## 2022-07-20 DIAGNOSIS — M1711 Unilateral primary osteoarthritis, right knee: Secondary | ICD-10-CM

## 2022-07-20 NOTE — Telephone Encounter (Signed)
Copied from [place] [phone]. Topic: Referral - Request for Referral >> Jul 20, 2022  3:59 PM Cyndi Bender wrote: Has patient seen PCP for this complaint? Yes.   *If NO, is insurance requiring patient see PCP for this issue before PCP can refer them? Referral for which specialty: [facility] Preferred provider/office: no specific location Reason for referral: pain in right knee

## 2022-07-22 ENCOUNTER — Ambulatory Visit (INDEPENDENT_AMBULATORY_CARE_PROVIDER_SITE_OTHER): Payer: PRIVATE HEALTH INSURANCE

## 2022-07-22 ENCOUNTER — Ambulatory Visit (INDEPENDENT_AMBULATORY_CARE_PROVIDER_SITE_OTHER): Payer: PRIVATE HEALTH INSURANCE | Admitting: Orthopaedic Surgery

## 2022-07-22 VITALS — Ht 65.0 in | Wt 204.0 lb

## 2022-07-22 DIAGNOSIS — G8929 Other chronic pain: Secondary | ICD-10-CM

## 2022-07-22 DIAGNOSIS — M25561 Pain in right knee: Secondary | ICD-10-CM

## 2022-07-22 MED ORDER — METHYLPREDNISOLONE ACETATE 40 MG/ML IJ SUSP
40.0000 mg | INTRAMUSCULAR | Status: AC | PRN
  Administered 2022-07-22: 40 mg via INTRA_ARTICULAR

## 2022-07-22 MED ORDER — LIDOCAINE HCL 1 % IJ SOLN
3.0000 mL | INTRAMUSCULAR | Status: AC | PRN
  Administered 2022-07-22: 3 mL

## 2022-07-22 NOTE — Telephone Encounter (Signed)
Patient informed that referral was place today and pain clinic will be reaching out for an appt.

## 2022-07-22 NOTE — Addendum Note (Signed)
Addended by: Charlott Rakes on: 07/22/2022 01:35 PM   Modules accepted: Orders

## 2022-07-22 NOTE — Telephone Encounter (Signed)
Referral has been placed. 

## 2022-07-22 NOTE — Progress Notes (Signed)
Office Visit Note   Patient: Theresa Cantrell           Date of Birth: 03-Jul-1976           MRN: [id number] Visit Date: 07/22/2022              Requested by: Ladell Pier, MD [address] [place],  [place] [address] PCP: Ladell Pier, MD   Assessment & Plan: Visit Diagnoses:  1. Chronic pain of right knee     Plan: I was able to aspirate 20 to 30 cc of clear fluid from the right knee and placed a steroid injection in her knee.  At this point this is a second steroid injection so she does need an MRI of that right knee to assess the cartilage and rule out a meniscal tear given her continued pain combined with locking catching that knee as well as recurrent effusions.  We will see her back once we have the MRI of her right knee.  She agrees with this treatment plan.  Follow-Up Instructions: No follow-ups on file.   Orders:  Orders Placed This Encounter  Procedures   Large Joint Inj   XR Knee 1-2 Views Right   No orders of the defined types were placed in this encounter.     Procedures: Large Joint Inj: R knee on 07/22/2022 4:25 PM Indications: diagnostic evaluation and pain Details: 22 G 1.5 in needle, superolateral approach  Arthrogram: No  Medications: 3 mL lidocaine 1 %; 40 mg methylPREDNISolone acetate 40 MG/ML Outcome: tolerated well, no immediate complications Procedure, treatment alternatives, risks and benefits explained, specific risks discussed. Consent was given by the patient. Immediately prior to procedure a time out was called to verify the correct patient, procedure, equipment, support staff and site/side marked as required. Patient was prepped and draped in the usual sterile fashion.       Clinical Data: No additional findings.   Subjective: Chief Complaint  Patient presents with   Right Knee - Pain  The patient comes in today with acute on chronic right knee pain.  She has had worsening pain for the last several months now and  had a steroid injection several months ago by another practice.  She said that did not really help.  She does have a history of a left patellofemoral replacement that was done elsewhere.  She was on her feet all day today at work and said her knee is definitely swollen and it hurts.  She denies any specific injury.  She is never had any surgery on the right knee.  She is only 46 years old.  She is not a diabetic.  HPI  Review of Systems There is no listed fever, chills, nausea, vomiting  Objective: Vital Signs: Ht '5\' 5"'$  (1.651 m)   Wt 204 lb (92.5 kg)   BMI 33.95 kg/m   Physical Exam She is alert and orient x3 and in no acute distress Ortho Exam Examination of her right knee does show moderate effusion.  There is pain medially and some slight varus malalignment that is correctable. Specialty Comments:  No specialty comments available.  Imaging: XR Knee 1-2 Views Right  Result Date: 07/22/2022 3 views of the right knee show moderate effusion.  There is slight varus malalignment with medial joint space narrowing and significant patellofemoral narrowing.  There are osteophytes medial and patellofemoral as well as posterior.    PMFS History: Patient Active Problem List   Diagnosis Date  Noted   Adjustment disorder with mixed anxiety and depressed mood 02/11/2022   Rotator cuff tendinitis, right 11/14/2021   S/P cervical spinal fusion 06/17/2021   Spinal stenosis of cervical region 04/02/2021   Protrusion of cervical intervertebral disc 04/02/2021   COVID-19 vaccine series not completed 12/20/2020   Influenza vaccine refused 11/07/2020   Essential hypertension 11/07/2020   Obesity (BMI 30.0-34.9) 11/07/2020   Tobacco dependence 11/07/2020   Gastroesophageal reflux disease without esophagitis 11/07/2020   History of abnormal cervical Pap smear 11/07/2020   History of bipolar disorder 11/07/2020   Bipolar 2 disorder, major depressive episode ([place]) 06/12/2020   PTSD (post-traumatic  stress disorder) 06/12/2020   Chronic pain of left knee 10/05/2019   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   Neuropathy 02/07/2014   Hot flashes 02/07/2014   Weight loss 02/07/2014   Anxiety    Past Medical History:  Diagnosis Date   Anxiety    Arthritis    Bipolar 1 disorder (HCC)    Depression    GERD (gastroesophageal reflux disease)    Hypertension    PTSD (post-traumatic stress disorder)     Family History  Problem Relation Age of Onset   Hypertension Mother    Cancer Mother        breast   Anxiety disorder Mother    Hypertension Father    Schizophrenia Father    Bipolar disorder Father    Anxiety disorder Father    Colon cancer Neg Hx    Colon polyps Neg Hx    Esophageal cancer Neg Hx    Rectal cancer Neg Hx    Stomach cancer Neg Hx     Past Surgical History:  Procedure Laterality Date   ABDOMINAL HYSTERECTOMY     ANTERIOR CERVICAL DECOMP/DISCECTOMY FUSION N/A 06/06/2021   Procedure: C4-5, C5-6 ANTERIOR CERVICAL DISCECTOMY FUSION, ALLOGRAFT, PLATE;  Surgeon: Marybelle Killings, MD;  Location: [facility];  Service: Orthopedics;  Laterality: N/A;   CHOLECYSTECTOMY     DILATION AND CURETTAGE OF UTERUS     FOOT SURGERY Bilateral    two pins in each foot   JOINT REPLACEMENT Left 2018   partial knee replacement   KNEE SURGERY Left 2018   partial knee replacement   NECK SURGERY  06/06/2021   C4-5, C5-6 ANTERIOR CERVICAL DISCECTOMY FUSION, ALLOGRAFT, PLATE   Social History   Occupational History   Not on file  Tobacco Use   Smoking status: Former    Packs/day: 0.25    Types: Cigarettes   Smokeless tobacco: Never  Vaping Use   Vaping Use: Never used  Substance and Sexual Activity   Alcohol use: Yes    Alcohol/week: 2.0 standard drinks of alcohol    Types: 1 Glasses of wine, 1 Cans of beer per week   Drug use: No   Sexual activity: Not Currently    Birth control/protection: Surgical

## 2022-07-24 NOTE — Addendum Note (Signed)
Addended by: Robyne Peers on: 07/24/2022 01:45 PM   Modules accepted: Orders

## 2022-08-04 ENCOUNTER — Encounter: Payer: Self-pay | Admitting: Orthopaedic Surgery

## 2022-08-04 ENCOUNTER — Other Ambulatory Visit: Payer: Self-pay | Admitting: Orthopaedic Surgery

## 2022-08-04 MED ORDER — CELECOXIB 200 MG PO CAPS: 200.0000 mg | ORAL_CAPSULE | Freq: Two times a day (BID) | ORAL | 1 refills | Status: DC | PRN

## 2022-08-11 ENCOUNTER — Telehealth: Payer: Self-pay | Admitting: Emergency Medicine

## 2022-08-11 NOTE — Telephone Encounter (Signed)
Copied from [place] [phone]. Topic: General - Other >> Aug 11, 2022  3:09 PM Ludger Nutting wrote: Patient called to get a update on the status of her pain management referral. Please advise patient.

## 2022-08-12 NOTE — Telephone Encounter (Signed)
RTC, spoke w/ pt she was informed referral was sent per pcp 09/20. Pt expressed understanding. Informed pt of referral office number to reach out for appt. If they haven't reached her. Pt states she will call to f/u on appt. ---DD,RMA

## 2022-08-14 ENCOUNTER — Ambulatory Visit
Admission: RE | Admit: 2022-08-14 | Discharge: 2022-08-14 | Disposition: A | Discharge status: Home / Self Care | Payer: PRIVATE HEALTH INSURANCE | Source: Ambulatory Visit | Attending: Orthopaedic Surgery | Admitting: Orthopaedic Surgery

## 2022-08-14 DIAGNOSIS — G8929 Other chronic pain: Secondary | ICD-10-CM

## 2022-08-18 ENCOUNTER — Encounter: Payer: Self-pay | Admitting: Surgery

## 2022-08-18 ENCOUNTER — Ambulatory Visit (INDEPENDENT_AMBULATORY_CARE_PROVIDER_SITE_OTHER): Payer: PRIVATE HEALTH INSURANCE | Admitting: Surgery

## 2022-08-18 ENCOUNTER — Ambulatory Visit: Payer: Self-pay

## 2022-08-18 VITALS — BP 141/88 | HR 80 | Ht 65.0 in | Wt 205.0 lb

## 2022-08-18 DIAGNOSIS — J989 Respiratory disorder, unspecified: Secondary | ICD-10-CM

## 2022-08-18 DIAGNOSIS — M542 Cervicalgia: Secondary | ICD-10-CM | POA: Diagnosis not present

## 2022-08-18 DIAGNOSIS — R0683 Snoring: Secondary | ICD-10-CM

## 2022-08-18 NOTE — Progress Notes (Signed)
Office Visit Note   Patient: Theresa Cantrell           Date of Birth: [birth year]           MRN: [id number] Visit Date: 08/18/2022              Requested by: Ladell Pier, MD [address] [place],  [place] [address] PCP: Ladell Pier, MD   Assessment & Plan: Visit Diagnoses:  1. Neck pain   2. Snoring   3. Airway problem     Plan: I advised patient that I think her neck is doing okay from her surgery.  I do recommend that she see an ENT specialist and referral was placed.  Follow up with Dr. Lorin Mercy in 6 weeks for recheck.  Follow-Up Instructions: Return in about 6 weeks (around 09/29/2022) for WITH DR Twiggs RECHECK.   Orders:  Orders Placed This Encounter  Procedures   XR Cervical Spine 2 or 3 views   Ambulatory referral to ENT   No orders of the defined types were placed in this encounter.     Procedures: No procedures performed   Clinical Data: No additional findings.   Subjective: Chief Complaint  Patient presents with   Neck - Pain    HPI 46 year-old female comes in with complaints of feeling like her air gets cut off when she yawns.  Patient is status post C4-5 C5-6 ACDF June 07, 2019 by Dr. Lorin Mercy.  States that she had been doing very well over the last few months she did have these episodes where she yawns and feels like "air supply gets cut off".  No describing any neck pain and does not have any upper extremity radicular symptoms.  States that she does yawn more frequently and snores.  Uses breathing strips at night that did not help.  Does occasionally take some daytime naps.  She has never been formally diagnosed with sleep apnea. Review of Systems No current complaints of cardiac  GI/GU issues  Objective: Vital Signs: BP (!) 141/88   Pulse 80   Ht '5\' 5"'$  (1.651 m)   Wt 205 lb (93 kg)   BMI 34.11 kg/m   Physical Exam HENT:     Head: Normocephalic and atraumatic.     Nose: Nose normal.  Eyes:     Extraocular  Movements: Extraocular movements intact.  Pulmonary:     Effort: No respiratory distress.  Musculoskeletal:     Comments: Exam cervical spine no brachial plexus or trapezius tenderness.  Bilateral shoulder exam unremarkable.  No focal motor deficits.  Neurological:     Mental Status: She is alert and oriented to person, place, and time.  Psychiatric:        Mood and Affect: Mood normal.     Ortho Exam  Specialty Comments:  No specialty comments available.  Imaging: No results found.   PMFS History: Patient Active Problem List   Diagnosis Date Noted   Adjustment disorder with mixed anxiety and depressed mood 02/11/2022   Rotator cuff tendinitis, right 11/14/2021   S/P cervical spinal fusion 06/17/2021   Spinal stenosis of cervical region 04/02/2021   Protrusion of cervical intervertebral disc 04/02/2021   COVID-19 vaccine series not completed 12/20/2020   Influenza vaccine refused 11/07/2020   Essential hypertension 11/07/2020   Obesity (BMI 30.0-34.9) 11/07/2020   Tobacco dependence 11/07/2020   Gastroesophageal reflux disease without esophagitis 11/07/2020   History of abnormal cervical Pap smear 11/07/2020  History of bipolar disorder 11/07/2020   Bipolar 2 disorder, major depressive episode ([place]) 06/12/2020   PTSD (post-traumatic stress disorder) 06/12/2020   Chronic pain of left knee 10/05/2019   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   Neuropathy 02/07/2014   Hot flashes 02/07/2014   Weight loss 02/07/2014   Anxiety    Past Medical History:  Diagnosis Date   Anxiety    Arthritis    Bipolar 1 disorder (HCC)    Depression    GERD (gastroesophageal reflux disease)    Hypertension    PTSD (post-traumatic stress disorder)     Family History  Problem Relation Age of Onset   Hypertension Mother    Cancer Mother        breast   Anxiety disorder Mother    Hypertension Father    Schizophrenia Father    Bipolar disorder Father     Anxiety disorder Father    Colon cancer Neg Hx    Colon polyps Neg Hx    Esophageal cancer Neg Hx    Rectal cancer Neg Hx    Stomach cancer Neg Hx     Past Surgical History:  Procedure Laterality Date   ABDOMINAL HYSTERECTOMY     ANTERIOR CERVICAL DECOMP/DISCECTOMY FUSION N/A 06/06/2021   Procedure: C4-5, C5-6 ANTERIOR CERVICAL DISCECTOMY FUSION, ALLOGRAFT, PLATE;  Surgeon: Marybelle Killings, MD;  Location: [facility];  Service: Orthopedics;  Laterality: N/A;   CHOLECYSTECTOMY     DILATION AND CURETTAGE OF UTERUS     FOOT SURGERY Bilateral    two pins in each foot   JOINT REPLACEMENT Left 2018   partial knee replacement   KNEE SURGERY Left 2018   partial knee replacement   NECK SURGERY  06/06/2021   C4-5, C5-6 ANTERIOR CERVICAL DISCECTOMY FUSION, ALLOGRAFT, PLATE   Social History   Occupational History   Not on file  Tobacco Use   Smoking status: Former    Packs/day: 0.25    Types: Cigarettes   Smokeless tobacco: Never  Vaping Use   Vaping Use: Never used  Substance and Sexual Activity   Alcohol use: Yes    Alcohol/week: 2.0 standard drinks of alcohol    Types: 1 Glasses of wine, 1 Cans of beer per week   Drug use: No   Sexual activity: Not Currently    Birth control/protection: Surgical

## 2022-08-19 ENCOUNTER — Other Ambulatory Visit: Payer: Self-pay | Admitting: Internal Medicine

## 2022-08-19 DIAGNOSIS — M545 Low back pain, unspecified: Secondary | ICD-10-CM

## 2022-08-19 NOTE — Telephone Encounter (Signed)
Requested medication (s) are due for refill today: yes  Requested medication (s) are on the active medication list: yes    Last refill: 02/24/22  #30  1 refill  Future visit scheduled yes  yes 08/24/22  Notes to clinic: Not delegated, please review.  Requested Prescriptions  Pending Prescriptions Disp Refills   cyclobenzaprine (FLEXERIL) 5 MG tablet [Pharmacy Med Name: Cyclobenzaprine HCl 5 MG Oral Tablet] 30 tablet 0    Sig: TAKE 1 TABLET BY MOUTH ONCE DAILY AS NEEDED FOR MUSCLE SPASM     Not Delegated - Analgesics:  Muscle Relaxants Failed - 08/19/2022  4:28 PM      Failed - This refill cannot be delegated      Passed - Valid encounter within last 6 months    Recent Outpatient Visits           4 months ago Essential hypertension   [facility], RPH-CPP   5 months ago Essential hypertension   [facility] [place], Jarome Matin, RPH-CPP   5 months ago Acute bilateral low back pain without sciatica   [place], Deborah B, MD   10 months ago Essential hypertension   [place], Deborah B, MD   1 year ago Essential hypertension   [place], MD       Future Appointments             In 5 days Ladell Pier, MD [place]   In 2 weeks Ninfa Linden, Lind Guest, MD [facility]

## 2022-08-24 ENCOUNTER — Ambulatory Visit: Payer: PRIVATE HEALTH INSURANCE | Admitting: Internal Medicine

## 2022-08-24 ENCOUNTER — Ambulatory Visit: Payer: PRIVATE HEALTH INSURANCE | Attending: Internal Medicine | Admitting: Internal Medicine

## 2022-08-24 ENCOUNTER — Encounter: Payer: Self-pay | Admitting: Internal Medicine

## 2022-08-24 VITALS — BP 130/80 | HR 80 | Ht 65.0 in | Wt 200.2 lb

## 2022-08-24 DIAGNOSIS — Z2821 Immunization not carried out because of patient refusal: Secondary | ICD-10-CM

## 2022-08-24 DIAGNOSIS — E669 Obesity, unspecified: Secondary | ICD-10-CM | POA: Diagnosis not present

## 2022-08-24 DIAGNOSIS — F3181 Bipolar II disorder: Secondary | ICD-10-CM | POA: Diagnosis not present

## 2022-08-24 DIAGNOSIS — Z6833 Body mass index (BMI) 33.0-33.9, adult: Secondary | ICD-10-CM

## 2022-08-24 DIAGNOSIS — I10 Essential (primary) hypertension: Secondary | ICD-10-CM

## 2022-08-24 DIAGNOSIS — E66811 Obesity, class 1: Secondary | ICD-10-CM

## 2022-08-24 DIAGNOSIS — M545 Low back pain, unspecified: Secondary | ICD-10-CM | POA: Diagnosis not present

## 2022-08-24 MED ORDER — CYCLOBENZAPRINE HCL 5 MG PO TABS: ORAL_TABLET | ORAL | 1 refills | Status: DC

## 2022-08-24 MED ORDER — FUROSEMIDE 20 MG PO TABS: ORAL_TABLET | ORAL | 3 refills | Status: DC

## 2022-08-24 NOTE — Patient Instructions (Signed)

## 2022-08-24 NOTE — Progress Notes (Signed)
Patient ID: Theresa Cantrell, female    DOB: Sep 19, 1976  MRN: [id number]  CC: Back Pain and Medication Refill   Subjective: Theresa Cantrell is a 46 y.o. female who presents for chronic ds management Her concerns today include:  Patient with history of HTN, former tob dep, GERD, obesity,  PTSD, Bipolar 2,  CTS RT hand, family history of breast cancer in her mother.   HTN: compliant with Lasix 3x/wk and Diovan 40 mg daily  Checks BP a few times a wk.  Forgot to bring log but reports readings have been good. She limits salt in the foods. Denies CP/SOB/LE edema.  Back Pain: 02/24/2022:  C/o pain across lower back both sides x a few mths No initiating factors. Initially off and on then constant for past 2 wks.  No radiation, numbness/tingling/leg weakness, incontinence, groin anesthesia, fever.  Rates pain 7/10.  Worse with prolong sitting and when lying down.    Takes Ibuprofen up to 800 mg 2-3x/day. Does not help much.  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  Works 5 days a wk.   Today: She reports having some flareup of back pain in the right lower back.  She attributes it to the weather change and started about a month ago. -Does not radiate. -Not too bothersome during the day.  More noticeable at night when she lays down. -She had called for refill on Flexeril which she received last week.  She finds this to be very helpful.    Followed by [facility].  Last seen 12/2021.  She plans to make appt Still on same meds that include prazosin, Abilify, Prozac, trazodone and hydroxyzine.  She feels she is stable on these medicines.    Obesity:  down 4 lbs since last visit -working on eating habits: bakes her meats, veggies daily.  Not getting fruits daily; too expensive. Does a lot of walking at work and nit much exercise outside of that.  Patient Active Problem List   Diagnosis Date Noted   Adjustment disorder with mixed anxiety and depressed  mood 02/11/2022   Rotator cuff tendinitis, right 11/14/2021   S/P cervical spinal fusion 06/17/2021   Spinal stenosis of cervical region 04/02/2021   Protrusion of cervical intervertebral disc 04/02/2021   COVID-19 vaccine series not completed 12/20/2020   Influenza vaccine refused 11/07/2020   Essential hypertension 11/07/2020   Obesity (BMI 30.0-34.9) 11/07/2020   Tobacco dependence 11/07/2020   Gastroesophageal reflux disease without esophagitis 11/07/2020   History of abnormal cervical Pap smear 11/07/2020   History of bipolar disorder 11/07/2020   Bipolar 2 disorder, major depressive episode ([place]) 06/12/2020   PTSD (post-traumatic stress disorder) 06/12/2020   Chronic pain of left knee 10/05/2019   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   Neuropathy 02/07/2014   Hot flashes 02/07/2014   Weight loss 02/07/2014   Anxiety      Current Outpatient Medications on File Prior to Visit  Medication Sig Dispense Refill   ARIPiprazole (ABILIFY) 10 MG tablet Take 1 tablet (10 mg total) by mouth daily. 30 tablet 3   celecoxib (CELEBREX) 200 MG capsule Take 1 capsule (200 mg total) by mouth 2 (two) times daily between meals as needed. 60 capsule 1   Cholecalciferol (VITAMIN D) 50 MCG (2000 UT) tablet Take 2,000 Units by mouth daily.     FLUoxetine (PROZAC) 20 MG capsule Take 1 capsule (20 mg total) by mouth daily.  30 capsule 3   hydrOXYzine (ATARAX) 10 MG tablet Take 1 tablet (10 mg total) by mouth 3 (three) times daily as needed. 90 tablet 3   prazosin (MINIPRESS) 1 MG capsule Take 1 capsule (1 mg total) by mouth at bedtime. 30 capsule 3   traZODone (DESYREL) 50 MG tablet Take 1 tablet (50 mg total) by mouth at bedtime as needed for sleep. 30 tablet 3   valsartan (DIOVAN) 40 MG tablet Take 1 tablet (40 mg total) by mouth daily. 90 tablet 1   No current facility-administered medications on file prior to visit.    Allergies  Allergen Reactions   Amlodipine Swelling     Lower extremity edema.   Hydrochlorothiazide Other (See Comments)    Cough   Lamotrigine Hives    Social History   Socioeconomic History   Marital status: Single    Spouse name: Not on file   Number of children: 1   Years of education: Not on file   Highest education level: Some college, no degree  Occupational History   Not on file  Tobacco Use   Smoking status: Former    Packs/day: 0.25    Types: Cigarettes   Smokeless tobacco: Never  Vaping Use   Vaping Use: Never used  Substance and Sexual Activity   Alcohol use: Yes    Alcohol/week: 2.0 standard drinks of alcohol    Types: 1 Glasses of wine, 1 Cans of beer per week   Drug use: No   Sexual activity: Not Currently    Birth control/protection: Surgical  Other Topics Concern   Not on file  Social History Narrative   Not on file   Social Determinants of Health   Financial Resource Strain: Not on file  Food Insecurity: Not on file  Transportation Needs: Not on file  Physical Activity: Not on file  Stress: Not on file  Social Connections: Not on file  Intimate Partner Violence: Not on file    Family History  Problem Relation Age of Onset   Hypertension Mother    Cancer Mother        breast   Anxiety disorder Mother    Hypertension Father    Schizophrenia Father    Bipolar disorder Father    Anxiety disorder Father    Colon cancer Neg Hx    Colon polyps Neg Hx    Esophageal cancer Neg Hx    Rectal cancer Neg Hx    Stomach cancer Neg Hx     Past Surgical History:  Procedure Laterality Date   ABDOMINAL HYSTERECTOMY     ANTERIOR CERVICAL DECOMP/DISCECTOMY FUSION N/A 06/06/2021   Procedure: C4-5, C5-6 ANTERIOR CERVICAL DISCECTOMY FUSION, ALLOGRAFT, PLATE;  Surgeon: Marybelle Killings, MD;  Location: [facility];  Service: Orthopedics;  Laterality: N/A;   CHOLECYSTECTOMY     DILATION AND CURETTAGE OF UTERUS     FOOT SURGERY Bilateral    two pins in each foot   JOINT REPLACEMENT Left 2018   partial knee  replacement   KNEE SURGERY Left 2018   partial knee replacement   NECK SURGERY  06/06/2021   C4-5, C5-6 ANTERIOR CERVICAL DISCECTOMY FUSION, ALLOGRAFT, PLATE    ROS: Review of Systems Negative except as stated above  PHYSICAL EXAM: BP 130/80   Pulse 80   Ht '5\' 5"'$  (1.651 m)   Wt 200 lb 3.2 oz (90.8 kg)   SpO2 92%   BMI 33.32 kg/m   Wt Readings from Last 3 Encounters:  08/24/22  200 lb 3.2 oz (90.8 kg)  08/18/22 205 lb (93 kg)  07/22/22 204 lb (92.5 kg)    Physical Exam  General appearance - alert, well appearing, obese middle-age African-American female and in no distress Mental status - normal mood, behavior, speech, dress, motor activity, and thought processes Neck - supple, no significant adenopathy Chest - clear to auscultation, no wheezes, rales or rhonchi, symmetric air entry Heart - normal rate, regular rhythm, normal S1, S2, no murmurs, rubs, clicks or gallops Extremities - peripheral pulses normal, no pedal edema, no clubbing or cyanosis MSK: No tenderness on palpation of the lumbar spine or surrounding paraspinal muscles.  Straight leg raise negative bilaterally.    08/24/2022    3:25 PM 12/24/2021    3:37 PM 10/16/2021    9:32 AM  Depression screen PHQ 2/9  Decreased Interest 1  0  Down, Depressed, Hopeless 1  2  PHQ - 2 Score 2  2  Altered sleeping 1  3  Tired, decreased energy 1  1  Change in appetite 0  3  Feeling bad or failure about yourself  0  0  Trouble concentrating 0  0  Moving slowly or fidgety/restless 0  0  Suicidal thoughts 0  0  PHQ-9 Score 4  9  Difficult doing work/chores        Information is confidential and restricted. Go to Review Flowsheets to unlock data.       Latest Ref Rng & Units 04/21/2022    3:42 PM 10/01/2021    5:23 PM 05/28/2021    3:30 PM  CMP  Glucose 70 - 99 mg/dL 82  86  64   BUN 6 - 24 mg/dL '17  21  24   '$ Creatinine 0.57 - 1.00 mg/dL 0.87  1.12  1.03   Sodium 134 - 144 mmol/L 138  143  136   Potassium 3.5 -  5.2 mmol/L 4.5  4.3  3.6   Chloride 96 - 106 mmol/L 103  107  104   CO2 20 - 29 mmol/L '26  29  28   '$ Calcium 8.7 - 10.2 mg/dL 9.1  9.5  9.3   Total Protein 6.0 - 8.5 g/dL 6.9  6.6  6.6   Total Bilirubin 0.0 - 1.2 mg/dL 0.2  0.3  0.3   Alkaline Phos 44 - 121 IU/L 94   87   AST 0 - 40 IU/L '16  22  21   '$ ALT 0 - 32 IU/L '11  19  10    '$ Lipid Panel     Component Value Date/Time   CHOL 165 11/07/2020 1610   TRIG 84 11/07/2020 1610   HDL 63 11/07/2020 1610   CHOLHDL 2.6 11/07/2020 1610   LDLCALC 86 11/07/2020 1610    CBC    Component Value Date/Time   WBC 6.9 05/28/2021 1530   RBC 4.39 05/28/2021 1530   HGB 13.2 05/28/2021 1530   HGB 14.2 11/07/2020 1610   HCT 40.8 05/28/2021 1530   HCT 42.3 11/07/2020 1610   PLT 240 05/28/2021 1530   PLT 250 11/07/2020 1610   MCV 92.9 05/28/2021 1530   MCV 91 11/07/2020 1610   MCH 30.1 05/28/2021 1530   MCHC 32.4 05/28/2021 1530   RDW 13.3 05/28/2021 1530   RDW 12.5 11/07/2020 1610    ASSESSMENT AND PLAN:  1. Essential hypertension At goal.  Continue Diovan and furosemide - furosemide (LASIX) 20 MG tablet; 1 tab PO Q Mon/Wed/Friday  Dispense: 12 tablet;  Refill: 3  2. Acute bilateral low back pain without sciatica Questionable early arthritis changes.  She is on Celebrex from orthopedics for her knees.  She can continue the Flexeril as needed. - cyclobenzaprine (FLEXERIL) 5 MG tablet; TAKE 1 TABLET BY MOUTH ONCE DAILY AS NEEDED FOR MUSCLE SPASM  Dispense: 30 tablet; Refill: 1  3. Obesity (BMI 30.0-34.9) Commended her on changes that she is made in her eating habits so far. Patient advised to eliminate sugary drinks from the diet, cut back on portion sizes especially of white carbohydrates, eat more white lean meat like chicken [place] and seafood instead of beef or pork and incorporate fresh fruits and vegetables into the diet daily. Try to move as much as she can.   4. Bipolar 2 disorder, major depressive episode ([place]) Stable on current  medicines.  Advised her to call and schedule a follow-up appointment with her behavioral health provider as she is overdue for follow-up.  5. Influenza vaccination declined Recommended.  Patient declined.    Patient was given the opportunity to ask questions.  Patient verbalized understanding of the plan and was able to repeat key elements of the plan.   This documentation was completed using Radio producer.  Any transcriptional errors are unintentional.  No orders of the defined types were placed in this encounter.    Requested Prescriptions   Signed Prescriptions Disp Refills   furosemide (LASIX) 20 MG tablet 12 tablet 3    Sig: 1 tab PO Q Mon/Wed/Friday   cyclobenzaprine (FLEXERIL) 5 MG tablet 30 tablet 1    Sig: TAKE 1 TABLET BY MOUTH ONCE DAILY AS NEEDED FOR MUSCLE SPASM    Return in about 6 months (around 02/23/2023).  Karle Plumber, MD, FACP

## 2022-09-02 ENCOUNTER — Ambulatory Visit (INDEPENDENT_AMBULATORY_CARE_PROVIDER_SITE_OTHER): Payer: PRIVATE HEALTH INSURANCE | Admitting: Orthopaedic Surgery

## 2022-09-02 ENCOUNTER — Encounter: Payer: Self-pay | Admitting: Orthopaedic Surgery

## 2022-09-02 DIAGNOSIS — M25561 Pain in right knee: Secondary | ICD-10-CM

## 2022-09-02 DIAGNOSIS — M1711 Unilateral primary osteoarthritis, right knee: Secondary | ICD-10-CM | POA: Diagnosis not present

## 2022-09-02 NOTE — Progress Notes (Signed)
The patient comes in today to go over MRI of her right knee.  We saw her more recently we did aspirate 20 to 30 cc of fluid off the knee and placed a steroid injection in the right knee.  This helped temporize her pain.  She is back to work after recovering from cervical spine surgery.  She has remote history of a left knee patellofemoral replacement and ACL reconstruction done by one of our colleagues in town.  She said the left knee steroid injection did help some.  We did obtain an MRI given the symptoms she was having with her right knee.  The right knee today does show medial joint line tenderness and patellofemoral crepitation.  Her pain is mild to moderate.  The MRI of her right knee does show a radial tear of the posterior horn of the meniscus with extrusion of meniscal fragments on the medial meniscus of the right knee.  There is also moderate arthritis of the medial compartment the knee but no areas of full-thickness cartilage loss.  Her patellofemoral joint does have more extensive cartilage loss.  We talked about different various treatment options for her knee and one option be considering arthroscopic surgery.  We both felt that she would be a candidate for trying hyaluronic acid for that knee to treat the pain from osteoarthritis and she would like to try that first prior to any type of surgical intervention since she is back to work and does not want  miss any work right now.  I think it is reasonable to order hyaluronic acid for the right knee to treat the pain from osteoarthritis.

## 2022-09-03 ENCOUNTER — Ambulatory Visit: Payer: PRIVATE HEALTH INSURANCE | Admitting: Internal Medicine

## 2022-09-03 ENCOUNTER — Telehealth: Payer: Self-pay

## 2022-09-03 NOTE — Telephone Encounter (Signed)
Right knee gel injection  

## 2022-09-08 NOTE — Telephone Encounter (Signed)
VOB submitted for Monovisc, right knee. 

## 2022-09-15 ENCOUNTER — Telehealth: Payer: Self-pay

## 2022-09-15 NOTE — Telephone Encounter (Signed)
Talked with patient to advise her that per her insurance, PAI, they do not cover any gel injections.  Patient voiced that she understands.

## 2022-10-05 ENCOUNTER — Telehealth: Payer: PRIVATE HEALTH INSURANCE | Admitting: Physician Assistant

## 2022-10-05 ENCOUNTER — Ambulatory Visit: Payer: Self-pay | Admitting: *Deleted

## 2022-10-05 DIAGNOSIS — B9689 Other specified bacterial agents as the cause of diseases classified elsewhere: Secondary | ICD-10-CM

## 2022-10-05 DIAGNOSIS — J208 Acute bronchitis due to other specified organisms: Secondary | ICD-10-CM

## 2022-10-05 MED ORDER — BENZONATATE 100 MG PO CAPS: 100.0000 mg | ORAL_CAPSULE | Freq: Three times a day (TID) | ORAL | 0 refills | Status: DC | PRN

## 2022-10-05 MED ORDER — DOXYCYCLINE HYCLATE 100 MG PO TABS: 100.0000 mg | ORAL_TABLET | Freq: Two times a day (BID) | ORAL | 0 refills | Status: DC

## 2022-10-05 MED ORDER — ALBUTEROL SULFATE HFA 108 (90 BASE) MCG/ACT IN AERS
2.0000 | INHALATION_SPRAY | Freq: Four times a day (QID) | RESPIRATORY_TRACT | 0 refills | Status: DC | PRN
Start: 2022-10-05 — End: 2024-06-26

## 2022-10-05 NOTE — Patient Instructions (Signed)
Theresa Cantrell, thank you for joining Leeanne Rio, PA-C for today's virtual visit.  While this provider is not your primary care provider (PCP), if your PCP is located in our provider database this encounter information will be shared with them immediately following your visit.   [facility] account gives you access to today's visit and all your visits, tests, and labs performed at [facility] " click here if you don't have a [facility] account or go to [email or web address]  Consent: (Patient) Theresa Cantrell provided verbal consent for this virtual visit at the beginning of the encounter.  Current Medications:  Current Outpatient Medications:    ARIPiprazole (ABILIFY) 10 MG tablet, Take 1 tablet (10 mg total) by mouth daily., Disp: 30 tablet, Rfl: 3   celecoxib (CELEBREX) 200 MG capsule, Take 1 capsule (200 mg total) by mouth 2 (two) times daily between meals as needed., Disp: 60 capsule, Rfl: 1   Cholecalciferol (VITAMIN D) 50 MCG (2000 UT) tablet, Take 2,000 Units by mouth daily., Disp: , Rfl:    cyclobenzaprine (FLEXERIL) 5 MG tablet, TAKE 1 TABLET BY MOUTH ONCE DAILY AS NEEDED FOR MUSCLE SPASM, Disp: 30 tablet, Rfl: 1   FLUoxetine (PROZAC) 20 MG capsule, Take 1 capsule (20 mg total) by mouth daily., Disp: 30 capsule, Rfl: 3   furosemide (LASIX) 20 MG tablet, 1 tab PO Q Mon/Wed/Friday, Disp: 12 tablet, Rfl: 3   hydrOXYzine (ATARAX) 10 MG tablet, Take 1 tablet (10 mg total) by mouth 3 (three) times daily as needed., Disp: 90 tablet, Rfl: 3   prazosin (MINIPRESS) 1 MG capsule, Take 1 capsule (1 mg total) by mouth at bedtime., Disp: 30 capsule, Rfl: 3   traZODone (DESYREL) 50 MG tablet, Take 1 tablet (50 mg total) by mouth at bedtime as needed for sleep., Disp: 30 tablet, Rfl: 3   valsartan (DIOVAN) 40 MG tablet, Take 1 tablet (40 mg total) by mouth daily., Disp: 90 tablet, Rfl: 1   Medications ordered in this encounter:  No orders of the defined  types were placed in this encounter.    *If you need refills on other medications prior to your next appointment, please contact your pharmacy*  Follow-Up: Call back or seek an in-person evaluation if the symptoms worsen or if the condition fails to improve as anticipated.  [facility] [phone]  Other Instructions Take antibiotic (Doxycycline) as directed.  Increase fluids.  Get plenty of rest. Use Mucinex for congestion. Use the Tessalon and albuterol as directed. Take a daily probiotic (I recommend Align or Culturelle, but even Activia Yogurt may be beneficial).  A humidifier placed in the bedroom may offer some relief for a dry, scratchy throat of nasal irritation.  Read information below on acute bronchitis. Please call or return to clinic if symptoms are not improving.  Acute Bronchitis Bronchitis is when the airways that extend from the windpipe into the lungs get red, puffy, and painful (inflamed). Bronchitis often causes thick spit (mucus) to develop. This leads to a cough. A cough is the most common symptom of bronchitis. In acute bronchitis, the condition usually begins suddenly and goes away over time (usually in 2 weeks). Smoking, allergies, and asthma can make bronchitis worse. Repeated episodes of bronchitis may cause more lung problems.  HOME CARE Rest. Drink enough fluids to keep your pee (urine) clear or pale yellow (unless you need to limit fluids as told by your doctor). Only take over-the-counter or prescription medicines as  told by your doctor. Avoid smoking and secondhand smoke. These can make bronchitis worse. If you are a smoker, think about using nicotine gum or skin patches. Quitting smoking will help your lungs heal faster. Reduce the chance of getting bronchitis again by: Washing your hands often. Avoiding people with cold symptoms. Trying not to touch your hands to your mouth, nose, or eyes. Follow up with your doctor as told.  GET HELP  IF: Your symptoms do not improve after 1 week of treatment. Symptoms include: Cough. Fever. Coughing up thick spit. Body aches. Chest congestion. Chills. Shortness of breath. Sore throat.  GET HELP RIGHT AWAY IF:  You have an increased fever. You have chills. You have severe shortness of breath. You have bloody thick spit (sputum). You throw up (vomit) often. You lose too much body fluid (dehydration). You have a severe headache. You faint.  MAKE SURE YOU:  Understand these instructions. Will watch your condition. Will get help right away if you are not doing well or get worse. Document Released: 04/06/2008 Document Revised: 06/21/2013 Document Reviewed: 04/11/2013 [facility] Patient Information 2015 [place], [place]. This information is not intended to replace advice given to you by your health care provider. Make sure you discuss any questions you have with your health care provider.    If you have been instructed to have an in-person evaluation today at a local Urgent Care facility, please use the link below. It will take you to a list of all of our available [facility], including address, phone number and hours of operation. Please do not delay care.  [facility]  If you or a family member do not have a primary care provider, use the link below to schedule a visit and establish care. When you choose a [facility] primary care physician or advanced practice provider, you gain a long-term partner in health. Find a Primary Care Provider  Learn more about 's in-office and virtual care options: [facility]

## 2022-10-05 NOTE — Progress Notes (Signed)
Virtual Visit Consent   Theresa Cantrell, you are scheduled for a virtual visit with a [facility] provider today. Just as with appointments in the office, your consent must be obtained to participate. Your consent will be active for this visit and any virtual visit you may have with one of our providers in the next 365 days. If you have a MyChart account, a copy of this consent can be sent to you electronically.  As this is a virtual visit, video technology does not allow for your provider to perform a traditional examination. This may limit your provider's ability to fully assess your condition. If your provider identifies any concerns that need to be evaluated in person or the need to arrange testing (such as labs, EKG, etc.), we will make arrangements to do so. Although advances in technology are sophisticated, we cannot ensure that it will always work on either your end or our end. If the connection with a video visit is poor, the visit may have to be switched to a telephone visit. With either a video or telephone visit, we are not always able to ensure that we have a secure connection.  By engaging in this virtual visit, you consent to the provision of healthcare and authorize for your insurance to be billed (if applicable) for the services provided during this visit. Depending on your insurance coverage, you may receive a charge related to this service.  I need to obtain your verbal consent now. Are you willing to proceed with your visit today? Theresa Cantrell has provided verbal consent on 10/05/2022 for a virtual visit (video or telephone). Leeanne Rio, [place]  Date: 10/05/2022 9:35 AM  Virtual Visit via Video Note   I, Leeanne Rio, connected with  Theresa Cantrell  ([id number], 12-16-1975) on 10/05/22 at  9:30 AM EST by a video-enabled telemedicine application and verified that I am speaking with the correct person using two identifiers.  Location: Patient: Virtual Visit Location  Patient: Home Provider: Virtual Visit Location Provider: Home Office   I discussed the limitations of evaluation and management by telemedicine and the availability of in person appointments. The patient expressed understanding and agreed to proceed.    History of Present Illness: Theresa Cantrell is a 46 y.o. who identifies as a female who was assigned female at birth, and is being seen today for URI symptoms starting last week and worsening over weekend, now with cough that has become productive of yellow sputum, shortness of breath with exertion, sore throat and left ear pressure. Also with some residual hoarseness. Denies recent travel or known sick contact. Tested for COVID last Friday and again this morning -- negative both times.   HPI: HPI  Problems:  Patient Active Problem List   Diagnosis Date Noted   Adjustment disorder with mixed anxiety and depressed mood 02/11/2022   Rotator cuff tendinitis, right 11/14/2021   S/P cervical spinal fusion 06/17/2021   Spinal stenosis of cervical region 04/02/2021   Protrusion of cervical intervertebral disc 04/02/2021   COVID-19 vaccine series not completed 12/20/2020   Influenza vaccine refused 11/07/2020   Essential hypertension 11/07/2020   Obesity (BMI 30.0-34.9) 11/07/2020   Tobacco dependence 11/07/2020   Gastroesophageal reflux disease without esophagitis 11/07/2020   History of abnormal cervical Pap smear 11/07/2020   History of bipolar disorder 11/07/2020   Bipolar 2 disorder, major depressive episode ([place]) 06/12/2020   PTSD (post-traumatic stress disorder) 06/12/2020   Chronic pain of left knee 10/05/2019  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   Neuropathy 02/07/2014   Hot flashes 02/07/2014   Weight loss 02/07/2014   Anxiety     Allergies:  Allergies  Allergen Reactions   Amlodipine Swelling    Lower extremity edema.   Hydrochlorothiazide Other (See Comments)    Cough   Lamotrigine Hives    Medications:  Current Outpatient Medications:    albuterol (VENTOLIN HFA) 108 (90 Base) MCG/ACT inhaler, Inhale 2 puffs into the lungs every 6 (six) hours as needed for wheezing or shortness of breath., Disp: 8 g, Rfl: 0   benzonatate (TESSALON) 100 MG capsule, Take 1 capsule (100 mg total) by mouth 3 (three) times daily as needed for cough., Disp: 30 capsule, Rfl: 0   doxycycline (VIBRA-TABS) 100 MG tablet, Take 1 tablet (100 mg total) by mouth 2 (two) times daily., Disp: 14 tablet, Rfl: 0   ARIPiprazole (ABILIFY) 10 MG tablet, Take 1 tablet (10 mg total) by mouth daily., Disp: 30 tablet, Rfl: 3   celecoxib (CELEBREX) 200 MG capsule, Take 1 capsule (200 mg total) by mouth 2 (two) times daily between meals as needed., Disp: 60 capsule, Rfl: 1   Cholecalciferol (VITAMIN D) 50 MCG (2000 UT) tablet, Take 2,000 Units by mouth daily., Disp: , Rfl:    cyclobenzaprine (FLEXERIL) 5 MG tablet, TAKE 1 TABLET BY MOUTH ONCE DAILY AS NEEDED FOR MUSCLE SPASM, Disp: 30 tablet, Rfl: 1   FLUoxetine (PROZAC) 20 MG capsule, Take 1 capsule (20 mg total) by mouth daily., Disp: 30 capsule, Rfl: 3   furosemide (LASIX) 20 MG tablet, 1 tab PO Q Mon/Wed/Friday, Disp: 12 tablet, Rfl: 3   hydrOXYzine (ATARAX) 10 MG tablet, Take 1 tablet (10 mg total) by mouth 3 (three) times daily as needed., Disp: 90 tablet, Rfl: 3   prazosin (MINIPRESS) 1 MG capsule, Take 1 capsule (1 mg total) by mouth at bedtime., Disp: 30 capsule, Rfl: 3   traZODone (DESYREL) 50 MG tablet, Take 1 tablet (50 mg total) by mouth at bedtime as needed for sleep., Disp: 30 tablet, Rfl: 3   valsartan (DIOVAN) 40 MG tablet, Take 1 tablet (40 mg total) by mouth daily., Disp: 90 tablet, Rfl: 1  Observations/Objective: Patient is well-developed, well-nourished in no acute distress.  Resting comfortably at home.  Head is normocephalic, atraumatic.  No labored breathing. Speech is clear and coherent with logical content.  Patient is alert and oriented at  baseline.   Assessment and Plan: 1. Acute bacterial bronchitis - benzonatate (TESSALON) 100 MG capsule; Take 1 capsule (100 mg total) by mouth 3 (three) times daily as needed for cough.  Dispense: 30 capsule; Refill: 0 - albuterol (VENTOLIN HFA) 108 (90 Base) MCG/ACT inhaler; Inhale 2 puffs into the lungs every 6 (six) hours as needed for wheezing or shortness of breath.  Dispense: 8 g; Refill: 0 - doxycycline (VIBRA-TABS) 100 MG tablet; Take 1 tablet (100 mg total) by mouth 2 (two) times daily.  Dispense: 14 tablet; Refill: 0  Rx Doxycycline.  Increase fluids.  Rest.  Saline nasal spray.  Probiotic.  Mucinex as directed.  Humidifier in bedroom. Tessalon and albuterol per orders.  Call or return to clinic if symptoms are not improving.   Follow Up Instructions: I discussed the assessment and treatment plan with the patient. The patient was provided an opportunity to ask questions and all were answered. The patient agreed with the plan and demonstrated an understanding of the instructions.  A copy of instructions were sent  to the patient via MyChart unless otherwise noted below.   The patient was advised to call back or seek an in-person evaluation if the symptoms worsen or if the condition fails to improve as anticipated.  Time:  I spent 10 minutes with the patient via telehealth technology discussing the above problems/concerns.    Leeanne Rio, PA-C

## 2022-10-05 NOTE — Telephone Encounter (Signed)
  Chief Complaint: cough, fever Symptoms: cough, fever Frequency: na Pertinent Negatives: Patient denies SOB Disposition: '[]'$ ED /'[]'$ Urgent Care (no appt availability in office) / '[]'$ Appointment(In office/virtual)/ '[x]'$  [facility]/ '[]'$ Home Care/ '[]'$ Refused Recommended Disposition /'[]'$ [facility]/ '[]'$  Follow-up with PCP Additional Notes: No open appointment- virtual care UC appointment scheduled

## 2022-10-05 NOTE — Telephone Encounter (Signed)
Pt had virtual appointment today to discuss.

## 2022-10-05 NOTE — Telephone Encounter (Signed)
Summary: cough, stuffy nose   Patient experiencing stuffy nose, fever off and on, negative COVID test on 10/05/2022. No available appointments       Reason for Disposition  [1] Fever returns after gone for over 24 hours AND [2] symptoms worse or not improved  Answer Assessment - Initial Assessment Questions 1. ONSET: "When did the cough begin?"      *No Answer* 2. SEVERITY: "How bad is the cough today?"      Dry- wakes patient up, causing patient vomit 3. SPUTUM: "Describe the color of your sputum" (none, dry cough; clear, white, yellow, green)     no 4. HEMOPTYSIS: "Are you coughing up any blood?" If so ask: "How much?" (flecks, streaks, tablespoons, etc.)     no 5. DIFFICULTY BREATHING: "Are you having difficulty breathing?" If Yes, ask: "How bad is it?" (e.g., mild, moderate, severe)    - MILD: No SOB at rest, mild SOB with walking, speaks normally in sentences, can lie down, no retractions, pulse < 100.    - MODERATE: SOB at rest, SOB with minimal exertion and prefers to sit, cannot lie down flat, speaks in phrases, mild retractions, audible wheezing, pulse 100-120.    - SEVERE: Very SOB at rest, speaks in single words, struggling to breathe, sitting hunched forward, retractions, pulse > 120      normal 6. FEVER: "Do you have a fever?" If Yes, ask: "What is your temperature, how was it measured, and when did it start?"     Last night- 100.- yesterday 7. CARDIAC HISTORY: "Do you have any history of heart disease?" (e.g., heart attack, congestive heart failure)      no 8. LUNG HISTORY: "Do you have any history of lung disease?"  (e.g., pulmonary embolus, asthma, emphysema)     no 9. PE RISK FACTORS: "Do you have a history of blood clots?" (or: recent major surgery, recent prolonged travel, bedridden)     no 10. OTHER SYMPTOMS: "Do you have any other symptoms?" (e.g., runny nose, wheezing, chest pain)       Runny nose- yellow 11. PREGNANCY: "Is there any chance you are pregnant?"  "When was your last menstrual period?"       na 12. TRAVEL: "Have you traveled out of the country in the last month?" (e.g., travel history, exposures)       No- work only  Protocols used: Cough - Acute Non-Productive-A-AH

## 2022-11-10 ENCOUNTER — Encounter: Payer: Self-pay | Admitting: Orthopaedic Surgery

## 2022-11-10 ENCOUNTER — Ambulatory Visit (INDEPENDENT_AMBULATORY_CARE_PROVIDER_SITE_OTHER): Payer: PRIVATE HEALTH INSURANCE | Admitting: Orthopaedic Surgery

## 2022-11-10 VITALS — BP 134/84 | HR 84 | Ht 65.0 in | Wt 200.0 lb

## 2022-11-10 DIAGNOSIS — M75121 Complete rotator cuff tear or rupture of right shoulder, not specified as traumatic: Secondary | ICD-10-CM | POA: Diagnosis not present

## 2022-11-10 DIAGNOSIS — M75111 Incomplete rotator cuff tear or rupture of right shoulder, not specified as traumatic: Secondary | ICD-10-CM | POA: Diagnosis not present

## 2022-11-10 NOTE — Progress Notes (Signed)
Office Visit Note   Patient: Theresa Cantrell           Date of Birth: 1976/10/08           MRN: [id number] Visit Date: 11/10/2022              Requested by: Ladell Pier, MD [place],  [place] [address] PCP: Ladell Pier, MD   Assessment & Plan: Visit Diagnoses: Partial-thickness right rotator cuff tear Status post cervical fusion C4-5 C5-6.  Plan: We discussed options including subacromial injection, therapy.  Shoulder surgery was discussed.  She states she does not want to consider injection at this point she can return for follow-up and call if she would like to proceed with subacromial injection.  Follow-Up Instructions: Return in about 2 months (around 01/09/2023).   Orders:  No orders of the defined types were placed in this encounter.  No orders of the defined types were placed in this encounter.     Procedures: No procedures performed   Clinical Data: No additional findings.   Subjective: Chief Complaint  Patient presents with   Neck - Pain, Follow-up    HPI 47 year old female returns with ongoing symptoms in her right arm.  She had previous two-level cervical fusion done C4-5, C5-6 on 06/16/2021 1 year 5 months ago.  She states sometimes she has had some numbness in the fingers of her right hand.  Sometimes some pain in right leg with numbness and tingling in her calf and ankle.  She is taken anti-inflammatories over-the-counter medication without relief.  Recently been on Celebrex.  She has had an ENT referral and had sleep study review pending.  X-rays 08/18/2022 showed solid fusion both levels C4-5 C5-6.  She has had some symptoms with outstretched reaching overhead activity with the right arm only.  MRI showed some moderate supraspinatus tendinosis with low-grade partial tearing and subacromial bursitis.  Review of Systems all systems have been unchanged from 11/11/2021.   Objective: Vital Signs: BP 134/84   Pulse 84   Ht  '5\' 5"'$  (1.651 m)   Wt 200 lb (90.7 kg)   BMI 33.28 kg/m   Physical Exam Constitutional:      Appearance: She is well-developed.  HENT:     Head: Normocephalic.     Right Ear: External ear normal.     Left Ear: External ear normal. There is no impacted cerumen.  Eyes:     Pupils: Pupils are equal, round, and reactive to light.  Neck:     Thyroid: No thyromegaly.     Trachea: No tracheal deviation.  Cardiovascular:     Rate and Rhythm: Normal rate.  Pulmonary:     Effort: Pulmonary effort is normal.  Abdominal:     Palpations: Abdomen is soft.  Musculoskeletal:     Cervical back: No rigidity.  Skin:    General: Skin is warm and dry.  Neurological:     Mental Status: She is alert and oriented to person, place, and time.  Psychiatric:        Behavior: Behavior normal.     Ortho Exam positive impingement right shoulder negative left.  Negative drop arm test.  Long head of the biceps minimally tender.  Well-healed cervical incision negative Sp[place]ling.  Reflexes are intact.  Negative Yergason negative speeds.  Specialty Comments:  No specialty comments available.  Imaging: No results found.   PMFS History: Patient Active Problem List   Diagnosis Date Noted  Partial thickness rotator cuff tear 11/16/2022   Adjustment disorder with mixed anxiety and depressed mood 02/11/2022   Rotator cuff tendinitis, right 11/14/2021   S/P cervical spinal fusion 06/17/2021   Spinal stenosis of cervical region 04/02/2021   COVID-19 vaccine series not completed 12/20/2020   Influenza vaccine refused 11/07/2020   Essential hypertension 11/07/2020   Obesity (BMI 30.0-34.9) 11/07/2020   Tobacco dependence 11/07/2020   Gastroesophageal reflux disease without esophagitis 11/07/2020   History of abnormal cervical Pap smear 11/07/2020   History of bipolar disorder 11/07/2020   Bipolar 2 disorder, major depressive episode ([place]) 06/12/2020   PTSD (post-traumatic stress disorder) 06/12/2020    Chronic pain of left knee 10/05/2019   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   Neuropathy 02/07/2014   Hot flashes 02/07/2014   Weight loss 02/07/2014   Anxiety    Past Medical History:  Diagnosis Date   Anxiety    Arthritis    Bipolar 1 disorder (HCC)    Depression    GERD (gastroesophageal reflux disease)    Hypertension    PTSD (post-traumatic stress disorder)     Family History  Problem Relation Age of Onset   Hypertension Mother    Cancer Mother        breast   Anxiety disorder Mother    Hypertension Father    Schizophrenia Father    Bipolar disorder Father    Anxiety disorder Father    Colon cancer Neg Hx    Colon polyps Neg Hx    Esophageal cancer Neg Hx    Rectal cancer Neg Hx    Stomach cancer Neg Hx     Past Surgical History:  Procedure Laterality Date   ABDOMINAL HYSTERECTOMY     ANTERIOR CERVICAL DECOMP/DISCECTOMY FUSION N/A 06/06/2021   Procedure: C4-5, C5-6 ANTERIOR CERVICAL DISCECTOMY FUSION, ALLOGRAFT, PLATE;  Surgeon: Marybelle Killings, MD;  Location: [facility];  Service: Orthopedics;  Laterality: N/A;   CHOLECYSTECTOMY     DILATION AND CURETTAGE OF UTERUS     FOOT SURGERY Bilateral    two pins in each foot   JOINT REPLACEMENT Left 2018   partial knee replacement   KNEE SURGERY Left 2018   partial knee replacement   NECK SURGERY  06/06/2021   C4-5, C5-6 ANTERIOR CERVICAL DISCECTOMY FUSION, ALLOGRAFT, PLATE   Social History   Occupational History   Not on file  Tobacco Use   Smoking status: Former    Packs/day: 0.25    Types: Cigarettes   Smokeless tobacco: Never  Vaping Use   Vaping Use: Never used  Substance and Sexual Activity   Alcohol use: Yes    Alcohol/week: 2.0 standard drinks of alcohol    Types: 1 Glasses of wine, 1 Cans of beer per week   Drug use: No   Sexual activity: Not Currently    Birth control/protection: Surgical

## 2022-11-16 DIAGNOSIS — M75110 Incomplete rotator cuff tear or rupture of unspecified shoulder, not specified as traumatic: Secondary | ICD-10-CM | POA: Insufficient documentation

## 2022-11-23 ENCOUNTER — Encounter: Payer: Self-pay | Admitting: Internal Medicine

## 2022-11-23 ENCOUNTER — Ambulatory Visit: Payer: 59 | Attending: Internal Medicine | Admitting: Internal Medicine

## 2022-11-23 VITALS — BP 131/83 | HR 70 | Temp 98.3°F | Ht 65.0 in | Wt 191.0 lb

## 2022-11-23 DIAGNOSIS — R070 Pain in throat: Secondary | ICD-10-CM

## 2022-11-23 DIAGNOSIS — R6889 Other general symptoms and signs: Secondary | ICD-10-CM

## 2022-11-23 NOTE — Progress Notes (Signed)
Patient ID: IRIDIAN READER, female    DOB: Jan 25, 1976  MRN: [id number]  CC: Pain (Throat pain when yawning, unable to breathe, getting worse X2 mo. /Experiencing frequent cold episodes - would like to check iron level/Med refill./No to flu vax.)   Subjective: Theresa Cantrell is a 47 y.o. female who presents for  UC visit Her concerns today include:  Patient with history of HTN, former tob dep, GERD, obesity,  PTSD, Bipolar 2,  CTS RT hand, family history of breast cancer in her mother.   C/o pulling on LT side of throat when she yawns x few mths.  Feels like air supply cuts off until the yawn is over with No problems swallowing or coughing. No wheezing Spoke with the surgeon Dr. Ninfa Linden, who did her cervical disc surgery with anterior approach 06/2021.  States she was told that this should not be causing the issue that she is experiencing.  Saw ENT 10/15/2022 Dr. Luana Shu at [facility].  Told she likely has sleep apnea but did think that the sleep apnea had anything to do with her symptoms  Feels cold a lot.  Has to turn heat to 75-80 x 1 mth. Started taking iron OTC as she thinks she may be anemic. Patient Active Problem List   Diagnosis Date Noted   Partial thickness rotator cuff tear 11/16/2022   Adjustment disorder with mixed anxiety and depressed mood 02/11/2022   Rotator cuff tendinitis, right 11/14/2021   S/P cervical spinal fusion 06/17/2021   Spinal stenosis of cervical region 04/02/2021   COVID-19 vaccine series not completed 12/20/2020   Influenza vaccine refused 11/07/2020   Essential hypertension 11/07/2020   Obesity (BMI 30.0-34.9) 11/07/2020   Tobacco dependence 11/07/2020   Gastroesophageal reflux disease without esophagitis 11/07/2020   History of abnormal cervical Pap smear 11/07/2020   History of bipolar disorder 11/07/2020   Bipolar 2 disorder, major depressive episode ([place]) 06/12/2020   PTSD (post-traumatic stress disorder) 06/12/2020   Chronic pain of left knee  10/05/2019   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 8 g 0   ARIPiprazole (ABILIFY) 10 MG tablet Take 1 tablet (10 mg total) by mouth daily. 30 tablet 3   celecoxib (CELEBREX) 200 MG capsule Take 1 capsule (200 mg total) by mouth 2 (two) times daily between meals as needed. 60 capsule 1   Cholecalciferol (VITAMIN D) 50 MCG (2000 UT) tablet Take 2,000 Units by mouth daily.     cyclobenzaprine (FLEXERIL) 5 MG tablet TAKE 1 TABLET BY MOUTH ONCE DAILY AS NEEDED FOR MUSCLE SPASM 30 tablet 1   FLUoxetine (PROZAC) 20 MG capsule Take 1 capsule (20 mg total) by mouth daily. 30 capsule 3   furosemide (LASIX) 20 MG tablet 1 tab PO Q Mon/Wed/Friday 12 tablet 3   hydrOXYzine (ATARAX) 10 MG tablet Take 1 tablet (10 mg total) by mouth 3 (three) times daily as needed. 90 tablet 3   prazosin (MINIPRESS) 1 MG capsule Take 1 capsule (1 mg total) by mouth at bedtime. 30 capsule 3   traZODone (DESYREL) 50 MG tablet Take 1 tablet (50 mg total) by mouth at  bedtime as needed for sleep. 30 tablet 3   valsartan (DIOVAN) 40 MG tablet Take 1 tablet (40 mg total) by mouth daily. 90 tablet 1   No current facility-administered medications on file prior to visit.    Allergies  Allergen Reactions   Amlodipine Swelling    Lower extremity edema.   Hydrochlorothiazide Other (See Comments)    Cough   Lamotrigine Hives    Social History   Socioeconomic History   Marital status: Single    Spouse name: Not on file   Number of children: 1   Years of education: Not on file   Highest education level: Some college, no degree  Occupational History   Not on file  Tobacco  Use   Smoking status: Former    Packs/day: 0.25    Types: Cigarettes   Smokeless tobacco: Never  Vaping Use   Vaping Use: Never used  Substance and Sexual Activity   Alcohol use: Yes    Alcohol/week: 2.0 standard drinks of alcohol    Types: 1 Glasses of wine, 1 Cans of beer per week   Drug use: No   Sexual activity: Not Currently    Birth control/protection: Surgical  Other Topics Concern   Not on file  Social History Narrative   Not on file   Social Determinants of Health   Financial Resource Strain: Not on file  Food Insecurity: Not on file  Transportation Needs: Not on file  Physical Activity: Not on file  Stress: Not on file  Social Connections: Not on file  Intimate Partner Violence: Not on file    Family History  Problem Relation Age of Onset   Hypertension Mother    Cancer Mother        breast   Anxiety disorder Mother    Hypertension Father    Schizophrenia Father    Bipolar disorder Father    Anxiety disorder Father    Colon cancer Neg Hx    Colon polyps Neg Hx    Esophageal cancer Neg Hx    Rectal cancer Neg Hx    Stomach cancer Neg Hx     Past Surgical History:  Procedure Laterality Date   ABDOMINAL HYSTERECTOMY     ANTERIOR CERVICAL DECOMP/DISCECTOMY FUSION N/A 06/06/2021   Procedure: C4-5, C5-6 ANTERIOR CERVICAL DISCECTOMY FUSION, ALLOGRAFT, PLATE;  Surgeon: Marybelle Killings, MD;  Location: [facility];  Service: Orthopedics;  Laterality: N/A;   CHOLECYSTECTOMY     DILATION AND CURETTAGE OF UTERUS     FOOT SURGERY Bilateral    two pins in each foot   JOINT REPLACEMENT Left 2018   partial knee replacement   KNEE SURGERY Left 2018   partial knee replacement   NECK SURGERY  06/06/2021   C4-5, C5-6 ANTERIOR CERVICAL DISCECTOMY FUSION, ALLOGRAFT, PLATE    ROS: Review of Systems Negative except as stated above  PHYSICAL EXAM: BP 131/83 (BP Location: Left Arm, Patient Position: Sitting, Cuff Size: Normal)   Pulse 70   Temp 98.3 F (36.8 C) (Oral)    Ht '5\' 5"'$  (1.651 m)   Wt 191 lb (86.6 kg)   SpO2 99%   BMI 31.78 kg/m   Wt Readings from Last 3 Encounters:  11/23/22 191 lb (86.6 kg)  11/10/22 200 lb (90.7 kg)  08/24/22 200 lb 3.2 oz (90.8 kg)    Physical Exam  General appearance - alert, well appearing, middle-aged African-American female and in no distress Mental status - normal mood, behavior, speech, dress, motor activity,  and thought processes Eyes -Pink conjunctiva Mouth - mucous membranes moist, pharynx normal without lesions Neck -no thyroid enlargement.  No lymphadenopathy.  She has a healed scar on the left side of the neck.  No tenderness on palpation over the scar. Chest - clear to auscultation, no wheezes, rales or rhonchi, symmetric air entry Heart - normal rate, regular rhythm, normal S1, S2, no murmurs, rubs, clicks or gallops      Latest Ref Rng & Units 04/21/2022    3:42 PM 10/01/2021    5:23 PM 05/28/2021    3:30 PM  CMP  Glucose 70 - 99 mg/dL 82  86  64   BUN 6 - 24 mg/dL '17  21  24   '$ Creatinine 0.57 - 1.00 mg/dL 0.87  1.12  1.03   Sodium 134 - 144 mmol/L 138  143  136   Potassium 3.5 - 5.2 mmol/L 4.5  4.3  3.6   Chloride 96 - 106 mmol/L 103  107  104   CO2 20 - 29 mmol/L '26  29  28   '$ Calcium 8.7 - 10.2 mg/dL 9.1  9.5  9.3   Total Protein 6.0 - 8.5 g/dL 6.9  6.6  6.6   Total Bilirubin 0.0 - 1.2 mg/dL 0.2  0.3  0.3   Alkaline Phos 44 - 121 IU/L 94   87   AST 0 - 40 IU/L '16  22  21   '$ ALT 0 - 32 IU/L '11  19  10    '$ Lipid Panel     Component Value Date/Time   CHOL 165 11/07/2020 1610   TRIG 84 11/07/2020 1610   HDL 63 11/07/2020 1610   CHOLHDL 2.6 11/07/2020 1610   LDLCALC 86 11/07/2020 1610    CBC    Component Value Date/Time   WBC 6.9 05/28/2021 1530   RBC 4.39 05/28/2021 1530   HGB 13.2 05/28/2021 1530   HGB 14.2 11/07/2020 1610   HCT 40.8 05/28/2021 1530   HCT 42.3 11/07/2020 1610   PLT 240 05/28/2021 1530   PLT 250 11/07/2020 1610   MCV 92.9 05/28/2021 1530   MCV 91 11/07/2020 1610    MCH 30.1 05/28/2021 1530   MCHC 32.4 05/28/2021 1530   RDW 13.3 05/28/2021 1530   RDW 12.5 11/07/2020 1610    ASSESSMENT AND PLAN:  1. Throat discomfort Will have her get a second opinion from Dr. Redmond Baseman as to whether she needs a laryngoscope. - Ambulatory referral to ENT  2. Cold intolerance - CBC - TSH+T4F+T3Free   Patient was given the opportunity to ask questions.  Patient verbalized understanding of the plan and was able to repeat key elements of the plan.   This documentation was completed using Radio producer.  Any transcriptional errors are unintentional.  No orders of the defined types were placed in this encounter.    Requested Prescriptions    No prescriptions requested or ordered in this encounter    No follow-ups on file.  Karle Plumber, MD, FACP

## 2022-11-24 LAB — CBC
Hematocrit: 40.6 % (ref 34.0–46.6)
Hemoglobin: 13.3 g/dL (ref 11.1–15.9)
MCH: 28.8 pg (ref 26.6–33.0)
MCHC: 32.8 g/dL (ref 31.5–35.7)
MCV: 88 fL (ref 79–97)
Platelets: 268 10*3/uL (ref 150–450)
RBC: 4.62 x10E6/uL (ref 3.77–5.28)
RDW: 13.2 % (ref 11.7–15.4)
WBC: 6.6 10*3/uL (ref 3.4–10.8)

## 2022-11-24 LAB — TSH+T4F+T3FREE
Free T4: 1.03 ng/dL (ref 0.82–1.77)
T3, Free: 2.6 pg/mL (ref 2.0–4.4)
TSH: 0.711 u[IU]/mL (ref 0.450–4.500)

## 2022-11-30 ENCOUNTER — Encounter (HOSPITAL_COMMUNITY): Payer: Self-pay

## 2022-11-30 ENCOUNTER — Ambulatory Visit (INDEPENDENT_AMBULATORY_CARE_PROVIDER_SITE_OTHER): Payer: 59 | Admitting: Mental Health

## 2022-11-30 DIAGNOSIS — Z8659 Personal history of other mental and behavioral disorders: Secondary | ICD-10-CM | POA: Diagnosis not present

## 2022-11-30 DIAGNOSIS — R69 Illness, unspecified: Secondary | ICD-10-CM | POA: Diagnosis not present

## 2022-11-30 DIAGNOSIS — F418 Other specified anxiety disorders: Secondary | ICD-10-CM | POA: Insufficient documentation

## 2022-11-30 DIAGNOSIS — F419 Anxiety disorder, unspecified: Secondary | ICD-10-CM
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
## 2022-12-01 ENCOUNTER — Encounter (HOSPITAL_COMMUNITY): Payer: Self-pay | Admitting: Physician Assistant

## 2022-12-01 ENCOUNTER — Ambulatory Visit (INDEPENDENT_AMBULATORY_CARE_PROVIDER_SITE_OTHER): Payer: 59 | Admitting: Physician Assistant

## 2022-12-01 DIAGNOSIS — F3181 Bipolar II disorder: Secondary | ICD-10-CM

## 2022-12-01 DIAGNOSIS — F419 Anxiety disorder, unspecified: Secondary | ICD-10-CM | POA: Diagnosis not present

## 2022-12-01 DIAGNOSIS — R69 Illness, unspecified: Secondary | ICD-10-CM | POA: Diagnosis not present

## 2022-12-01 DIAGNOSIS — F4310 Post-traumatic stress disorder, unspecified: Secondary | ICD-10-CM

## 2022-12-01 MED ORDER — HYDROXYZINE HCL 10 MG PO TABS: 10.0000 mg | ORAL_TABLET | Freq: Three times a day (TID) | ORAL | 1 refills | Status: DC | PRN

## 2022-12-01 MED ORDER — PRAZOSIN HCL 1 MG PO CAPS: 1.0000 mg | ORAL_CAPSULE | Freq: Every day | ORAL | 1 refills | Status: DC

## 2022-12-01 MED ORDER — TRAZODONE HCL 50 MG PO TABS: 50.0000 mg | ORAL_TABLET | Freq: Every evening | ORAL | 1 refills | Status: DC | PRN

## 2022-12-01 MED ORDER — FLUOXETINE HCL 10 MG PO CAPS: 10.0000 mg | ORAL_CAPSULE | Freq: Every day | ORAL | 1 refills | Status: DC

## 2022-12-01 MED ORDER — ARIPIPRAZOLE 5 MG PO TABS: 5.0000 mg | ORAL_TABLET | Freq: Every day | ORAL | 1 refills | Status: DC
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
## 2022-12-16 ENCOUNTER — Ambulatory Visit: Payer: 59 | Admitting: Family Medicine

## 2022-12-29 ENCOUNTER — Ambulatory Visit (INDEPENDENT_AMBULATORY_CARE_PROVIDER_SITE_OTHER): Payer: 59 | Admitting: Mental Health

## 2022-12-29 ENCOUNTER — Telehealth: Payer: Self-pay | Admitting: Internal Medicine

## 2022-12-29 ENCOUNTER — Encounter (HOSPITAL_COMMUNITY): Payer: Self-pay

## 2022-12-29 DIAGNOSIS — F3181 Bipolar II disorder: Secondary | ICD-10-CM

## 2022-12-29 DIAGNOSIS — F419 Anxiety disorder, unspecified: Secondary | ICD-10-CM

## 2022-12-29 NOTE — Telephone Encounter (Signed)
Copied from [place] [id number]. Topic: Referral - Request for Referral >> Dec 29, 2022  4:19 PM Erskine Squibb wrote: Has patient seen PCP for this complaint? Yes.   Referral for which specialty: Neuro or Ortho in [facility] Preferred provider/office: Specialist in [place] that takes her [employer] Reason for referral: Back pain  Please assist patient further as she is still having pain and complications even with taking the muscle relaxer

## 2022-12-30 NOTE — Progress Notes (Signed)
   THERAPIST PROGRESS NOTE  Session Time: 4:33pm ( 43 minutes)   Participation Level: Active  Behavioral Response: CasualAlertNeutral  Type of Therapy: Individual Therapy  Treatment Goals addressed: STG: [place] will increase management of moods and stressors AEB development of x 3 effective coping skills with ability to process stressors in effective manner per self report within the next 90 days   ProgressTowards Goals: Initial  Interventions: CBT and Supportive  Summary: STEFHANIE AYRES is a 47 y.o. female who presents with dx of bipolar disorder and anxiety. Presents for session alert and oriented; mood and affect adequate. Speech clear and coherent at normal rate and tone. Engaged and receptive to interventions. Denies excessive highs or lows. Shares to be doing "ok" and notes for uncle that was in hospice to have passed away. Shares concern for mother whom was close to uncle and her ability to navigate grief process. States to feel as if she is coping with Uncle's passing but denies for mother to have activities to engage in during the day to support as means of distraction. Shares ongoing health concerns as additional stressor along with checking in with mother. In need of additional surgery with not able to complete at this time due to not wanting to take time off work. Able to engage with therapist and explore working to identify factors in which she can and can not control and ability to navigate and cope with stressors. Shares to hold plan to work to be able to have needed surgeries completed with having obtained additional insurance. Denies safety concerns. Working identify stressors and working to cope; sxs stable at this time. No safety concerns reported.   Suicidal/Homicidal: Nowithout intent/plan  Therapist Response: Therapist engaged Evlyn in therapy session. Completed check in and assessed for current level of functioning, sxs management and level of stressors. Reviewed  assessment information and treatment plan. Explored historical coping skills and ability to navigate stressors. Provided safe space for Kyeshia to share thoughts and concerns and feelings of grief with other stressors. Provided education on grief process and grief to look different for different individuals and to allow self time; normalized feelings. Provided education on realistic vs. Unrealistic stressors and working to focus on stress in which she can exercise some control and ability to develop plan. Encouraged ongoing balance in daily life and engagement in hobbies and socialization for support. Reviewed session and provided follow up appointment. No safety concerns noted.   Plan: Return again in x 4 weeks.  Diagnosis: Bipolar 2 disorder, major depressive episode ([place])  Anxiety  Collaboration of Care: Other None  Patient/Guardian was advised Release of Information must be obtained prior to any record release in order to collaborate their care with an outside provider. Patient/Guardian was advised if they have not already done so to contact the registration department to sign all necessary forms in order for [place] to release information regarding their care.   Consent: Patient/Guardian gives verbal consent for treatment and assignment of benefits for services provided during this visit. Patient/Guardian expressed understanding and agreed to proceed.   Rockey Situ [place], [facility] 12/30/2022

## 2023-01-12 ENCOUNTER — Ambulatory Visit (INDEPENDENT_AMBULATORY_CARE_PROVIDER_SITE_OTHER): Payer: PRIVATE HEALTH INSURANCE | Admitting: Orthopaedic Surgery

## 2023-01-12 ENCOUNTER — Ambulatory Visit: Payer: Self-pay

## 2023-01-12 VITALS — BP 125/89 | HR 92 | Ht 65.0 in | Wt 191.0 lb

## 2023-01-12 DIAGNOSIS — M545 Low back pain, unspecified: Secondary | ICD-10-CM

## 2023-01-12 NOTE — Progress Notes (Signed)
Office Visit Note   Patient: Theresa Cantrell           Date of Birth: 07/30/1976           MRN: [id number] Visit Date: 01/12/2023              Requested by: Ladell Pier, MD [address] [place],  [place] [address] PCP: Ladell Pier, MD   Assessment & Plan: Visit Diagnoses:  1. Acute bilateral low back pain, unspecified whether sciatica present     Plan: Will set patient up for some physical therapy for low back pain evaluation and treatment.  She can follow-up with me in 6 weeks if she is having persistent problems.  Follow-Up Instructions: No follow-ups on file.   Orders:  Orders Placed This Encounter  Procedures   XR Lumbar Spine 2-3 Views   No orders of the defined types were placed in this encounter.     Procedures: No procedures performed   Clinical Data: No additional findings.   Subjective: Chief Complaint  Patient presents with   Right Shoulder - Follow-up   Neck - Follow-up   Lower Back - Pain    HPI 47 year old female returns she states her shoulder is doing a little bit better she can lift it up overhead mild discomfort.  Previous scan showed low-grade partial tearing of the supraspinatus.  She states she has had increased problems with her lower back pain which has been present for about a month it is giving her more problems in her shoulder.  Previous two-level cervical fusion C4-5 C5-6 doing well.  Patient has trouble when she first gets up stiffness in her back.  Discomfort with turning twisting.  No associated bowel or bladder symptoms no chills or fever.  Review of Systems updated unchanged   Objective: Vital Signs: BP 125/89   Pulse 92   Ht '5\' 5"'$  (1.651 m)   Wt 191 lb (86.6 kg)   BMI 31.78 kg/m   Physical Exam Constitutional:      Appearance: She is well-developed.  HENT:     Head: Normocephalic.     Right Ear: External ear normal.     Left Ear: External ear normal. There is no impacted cerumen.  Eyes:      Pupils: Pupils are equal, round, and reactive to light.  Neck:     Thyroid: No thyromegaly.     Trachea: No tracheal deviation.  Cardiovascular:     Rate and Rhythm: Normal rate.  Pulmonary:     Effort: Pulmonary effort is normal.  Abdominal:     Palpations: Abdomen is soft.  Musculoskeletal:     Cervical back: No rigidity.  Skin:    General: Skin is warm and dry.  Neurological:     Mental Status: She is alert and oriented to person, place, and time.  Psychiatric:        Behavior: Behavior normal.     Ortho Exam negative straight leg raising knee and ankle jerk are intact patient can heel and toe walk she is slow getting from sitting to standing has some discomfort with extension pain with palpation lumbosacral junction.  Mild sciatic notch tenderness no trochanteric bursal tenderness.  Negative logroll to hips.  Anterior tib gastrocsoleus peroneals posterior tib are normal to resisted testing.  Specialty Comments:  No specialty comments available.  Imaging: No results found.   PMFS History: Patient Active Problem List   Diagnosis Date Noted   Depression with  anxiety 11/30/2022   Partial thickness rotator cuff tear 11/16/2022   Adjustment disorder with mixed anxiety and depressed mood 02/11/2022   Rotator cuff tendinitis, right 11/14/2021   S/P cervical spinal fusion 06/17/2021   Spinal stenosis of cervical region 04/02/2021   COVID-19 vaccine series not completed 12/20/2020   Influenza vaccine refused 11/07/2020   Essential hypertension 11/07/2020   Obesity (BMI 30.0-34.9) 11/07/2020   Tobacco dependence 11/07/2020   Gastroesophageal reflux disease without esophagitis 11/07/2020   History of abnormal cervical Pap smear 11/07/2020   History of bipolar disorder 11/07/2020   Bipolar 2 disorder, major depressive episode ([facility]) 06/12/2020   PTSD (post-traumatic stress disorder) 06/12/2020   Chronic pain of left knee 10/05/2019   VAIN I (vaginal intraepithelial neoplasia  grade I) 05/17/2018   Neuropathy 02/07/2014   Hot flashes 02/07/2014   Weight loss 02/07/2014   Anxiety    Past Medical History:  Diagnosis Date   Anxiety    Arthritis    Bipolar 1 disorder (HCC)    Depression    GERD (gastroesophageal reflux disease)    Hypertension    PTSD (post-traumatic stress disorder)     Family History  Problem Relation Age of Onset   Hypertension Mother    Cancer Mother        breast   Anxiety disorder Mother    Hypertension Father    Schizophrenia Father    Bipolar disorder Father    Anxiety disorder Father    Colon cancer Neg Hx    Colon polyps Neg Hx    Esophageal cancer Neg Hx    Rectal cancer Neg Hx    Stomach cancer Neg Hx     Past Surgical History:  Procedure Laterality Date   ABDOMINAL HYSTERECTOMY     ANTERIOR CERVICAL DECOMP/DISCECTOMY FUSION N/A 06/06/2021   Procedure: C4-5, C5-6 ANTERIOR CERVICAL DISCECTOMY FUSION, ALLOGRAFT, PLATE;  Surgeon: Marybelle Killings, MD;  Location: [facility];  Service: Orthopedics;  Laterality: N/A;   CHOLECYSTECTOMY     DILATION AND CURETTAGE OF UTERUS     FOOT SURGERY Bilateral    two pins in each foot   JOINT REPLACEMENT Left 2018   partial knee replacement   KNEE SURGERY Left 2018   partial knee replacement   NECK SURGERY  06/06/2021   C4-5, C5-6 ANTERIOR CERVICAL DISCECTOMY FUSION, ALLOGRAFT, PLATE   Social History   Occupational History   Not on file  Tobacco Use   Smoking status: Former    Packs/day: 0.25    Types: Cigarettes   Smokeless tobacco: Never  Vaping Use   Vaping Use: Never used  Substance and Sexual Activity   Alcohol use: Yes    Alcohol/week: 2.0 standard drinks of alcohol    Types: 1 Glasses of wine, 1 Cans of beer per week   Drug use: No   Sexual activity: Not Currently    Birth control/protection: Surgical

## 2023-01-13 ENCOUNTER — Ambulatory Visit: Payer: 59 | Admitting: Family Medicine

## 2023-01-18 ENCOUNTER — Other Ambulatory Visit: Payer: Self-pay | Admitting: Internal Medicine

## 2023-01-19 NOTE — Telephone Encounter (Signed)
Pt is calling to check on the status of her medication refill. Per pt she is out of medication. Please advise.

## 2023-01-19 NOTE — Telephone Encounter (Signed)
Requested medication (s) are due for refill today: yes  Requested medication (s) are on the active medication list: yes    Last refill: 06/03/22  #90 1 refill  Future visit scheduled yes 02/22/22  Notes to clinic:Failed due to labs, please review. Thank you  Requested Prescriptions  Pending Prescriptions Disp Refills   valsartan (DIOVAN) 40 MG tablet [Pharmacy Med Name: Valsartan 40 MG Oral Tablet] 30 tablet 0    Sig: TAKE 1 TABLET BY MOUTH ONCE DAILY .  STOP  LISINOPRIL     Cardiovascular:  Angiotensin Receptor Blockers Failed - 01/19/2023  1:26 PM      Failed - Cr in normal range and within 180 days    Creat  Date Value Ref Range Status  10/01/2021 1.12 (H) 0.50 - 0.99 mg/dL Final   Creatinine, Ser  Date Value Ref Range Status  04/21/2022 0.87 0.57 - 1.00 mg/dL Final         Failed - K in normal range and within 180 days    Potassium  Date Value Ref Range Status  04/21/2022 4.5 3.5 - 5.2 mmol/L Final         Passed - Patient is not pregnant      Passed - Last BP in normal range    BP Readings from Last 1 Encounters:  01/12/23 125/89         Passed - Valid encounter within last 6 months    Recent Outpatient Visits           1 month ago Throat discomfort   [place], MD   4 months ago Essential hypertension   [place], MD   9 months ago Essential hypertension   [place], Jarome Matin, RPH-CPP   10 months ago Essential hypertension   [place], Stephen L, RPH-CPP   10 months ago Acute bilateral low back pain without sciatica   [place], MD       Future Appointments             In 1 month Wynetta Emery, Dalbert Batman, MD [place]

## 2023-01-20 DIAGNOSIS — Z1231 Encounter for screening mammogram for malignant neoplasm of breast: Secondary | ICD-10-CM | POA: Diagnosis not present

## 2023-01-20 LAB — HM MAMMOGRAPHY: HM Mammogram: NORMAL (ref 0–4)

## 2023-01-22 ENCOUNTER — Ambulatory Visit (INDEPENDENT_AMBULATORY_CARE_PROVIDER_SITE_OTHER): Payer: 59 | Admitting: Physical Therapy

## 2023-01-22 ENCOUNTER — Encounter: Payer: Self-pay | Admitting: Physical Therapy

## 2023-01-22 ENCOUNTER — Other Ambulatory Visit: Payer: Self-pay

## 2023-01-22 DIAGNOSIS — M6281 Muscle weakness (generalized): Secondary | ICD-10-CM

## 2023-01-22 DIAGNOSIS — M5459 Other low back pain: Secondary | ICD-10-CM | POA: Diagnosis not present

## 2023-01-22 DIAGNOSIS — R29898 Other symptoms and signs involving the musculoskeletal system: Secondary | ICD-10-CM

## 2023-01-22 NOTE — Therapy (Signed)
OUTPATIENT PHYSICAL THERAPY THORACOLUMBAR EVALUATION   Patient Name: Theresa Cantrell MRN: [id number] DOB:June 20, 1976, 47 y.o., female Today's Date: 01/22/2023  END OF SESSION:  PT End of Session - 01/22/23 1511     Visit Number 1    Number of Visits 13    Date for PT Re-Evaluation 03/05/23    Authorization Type [employer]    Authorization Time Period 01/22/23 to 03/05/23    Authorization - Number of Visits 30    PT Start Time 1510    PT Stop Time 1555    PT Time Calculation (min) 45 min    Activity Tolerance Patient tolerated treatment well    Behavior During Therapy WFL for tasks assessed/performed             Past Medical History:  Diagnosis Date   Anxiety    Arthritis    Bipolar 1 disorder ([facility])    Depression    GERD (gastroesophageal reflux disease)    Hypertension    PTSD (post-traumatic stress disorder)    Past Surgical History:  Procedure Laterality Date   ABDOMINAL HYSTERECTOMY     ANTERIOR CERVICAL DECOMP/DISCECTOMY FUSION N/A 06/06/2021   Procedure: C4-5, C5-6 ANTERIOR CERVICAL DISCECTOMY FUSION, ALLOGRAFT, PLATE;  Surgeon: Marybelle Killings, MD;  Location: [facility];  Service: Orthopedics;  Laterality: N/A;   CHOLECYSTECTOMY     DILATION AND CURETTAGE OF UTERUS     FOOT SURGERY Bilateral    two pins in each foot   JOINT REPLACEMENT Left 2018   partial knee replacement   KNEE SURGERY Left 2018   partial knee replacement   NECK SURGERY  06/06/2021   C4-5, C5-6 ANTERIOR CERVICAL DISCECTOMY FUSION, ALLOGRAFT, PLATE   Patient Active Problem List   Diagnosis Date Noted   Depression with anxiety 11/30/2022   Partial thickness rotator cuff tear 11/16/2022   Adjustment disorder with mixed anxiety and depressed mood 02/11/2022   Rotator cuff tendinitis, right 11/14/2021   S/P cervical spinal fusion 06/17/2021   Spinal stenosis of cervical region 04/02/2021   COVID-19 vaccine series not completed 12/20/2020   Influenza vaccine refused 11/07/2020   Essential  hypertension 11/07/2020   Obesity (BMI 30.0-34.9) 11/07/2020   Tobacco dependence 11/07/2020   Gastroesophageal reflux disease without esophagitis 11/07/2020   History of abnormal cervical Pap smear 11/07/2020   History of bipolar disorder 11/07/2020   Bipolar 2 disorder, major depressive episode ([facility]) 06/12/2020   PTSD (post-traumatic stress disorder) 06/12/2020   Chronic pain of left knee 10/05/2019   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   Neuropathy 02/07/2014   Hot flashes 02/07/2014   Weight loss 02/07/2014   Anxiety     PCP: Karle Plumber MD   REFERRING PROVIDER: Marybelle Killings, MD  REFERRING DIAG:  M54.50 (ICD-10-CM) - Acute bilateral low back pain, unspecified whether sciatica present    Rationale for Evaluation and Treatment: Rehabilitation  THERAPY DIAG:  Other low back pain  Muscle weakness (generalized)  Other symptoms and signs involving the musculoskeletal system  ONSET DATE: 01/12/2023  SUBJECTIVE:  SUBJECTIVE STATEMENT:  I'm having a lot of pain on my lower left side and in the middle of my back, it came out of the blue. I get some pain down my legs but not a lot, all the way down the legs in the back. My doctor told me to tell you I had surgery for cervical spinal stenosis the year before last.   PERTINENT HISTORY:  Subjective:    Chief Complaint  Patient presents with   Right Shoulder - Follow-up   Neck - Follow-up   Lower Back - Pain      HPI 47 year old female returns she states her shoulder is doing a little bit better she can lift it up overhead mild discomfort.  Previous scan showed low-grade partial tearing of the supraspinatus.  She states she has had increased problems with her lower back pain which has been present for about a  month it is giving her more problems in her shoulder.  Previous two-level cervical fusion C4-5 C5-6 doing well.   Patient has trouble when she first gets up stiffness in her back.  Discomfort with turning twisting.  No associated bowel or bladder symptoms no chills or fever.  PAIN:  Are you having pain? Yes: NPRS scale: 4/10; can get to "10/10" a couple times a month no known causative factors  Pain location: mid low back and L low back  Pain description: sharp sometimes, dull and achey, sometimes feels like there's a pulled muscle  Aggravating factors: depends on which way I've turned, not a certain type of turn that I can quite remember  Relieving factors: walking around, movement, muscle pain medicine and mm relaxer   PRECAUTIONS: None  WEIGHT BEARING RESTRICTIONS: No  FALLS:  Has patient fallen in last 6 months? 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 Lorin Mercy PRN   OBJECTIVE:   DIAGNOSTIC FINDINGS:    PATIENT SURVEYS:  FOTO 43.6  SCREENING FOR RED FLAGS: Bowel or bladder incontinence: No Spinal tumors: No Cauda equina syndrome: No Compression fracture: No Abdominal aneurysm: No  COGNITION: Overall cognitive status: Within functional limits for tasks assessed     SENSATION: Not tested  MUSCLE LENGTH:  R quad mild limitation, L quad severe limitation but very guarded HS R mild limitation, HS L moderate limitation Piriformis R mild limitation, piriformis L severe limitation   LLD (-)   POSTURE: rounded shoulders, forward head, decreased lumbar lordosis,  and increased thoracic kyphosis  PALPATION:  Significant muscle spasms L lateral thigh, L glute, L paraspinals   LUMBAR ROM:   AROM eval  Flexion Moderate limitation; RFIS pain goes into L hip   Extension Moderate limitation/compensations at hips; REIS no change in pain   Right lateral flexion Moderate limitation   Left lateral flexion WNL   Right rotation WNL   Left rotation Severe limitation    (Blank rows = not tested)  LOWER EXTREMITY ROM:     Active  Right eval Left eval  Hip flexion    Hip extension    Hip abduction    Hip adduction  Hip internal rotation    Hip external rotation    Knee flexion    Knee extension    Ankle dorsiflexion    Ankle plantarflexion    Ankle inversion    Ankle eversion     (Blank rows = not tested)  LOWER EXTREMITY MMT:    MMT Right eval Left eval  Hip flexion 5 3 pain limited   Hip extension    Hip abduction 5 3+ pain limited   Hip adduction    Hip internal rotation    Hip external rotation    Knee flexion 4+ 4-  Knee extension 5 4  Ankle dorsiflexion 5 5  Ankle plantarflexion    Ankle inversion    Ankle eversion     (Blank rows = not tested)  LUMBAR SPECIAL TESTS:  Straight leg raise test: Negative  FUNCTIONAL TESTS:    GAIT:   TODAY'S TREATMENT:                                                                                                                              DATE:   Eval  Objective measures + appropriate education   TherEx  Nustep L1 x6 minutes BLEs only PPT x10 with 5 second holds SKTC x5 L LE 5 second holds Bridges with L LE closer to rear x5 Lumbar rotation stretch for L side 5x5 second holds      PATIENT EDUCATION:  Education details: exam findings, POC, HEP Person educated: Patient Education method: Consulting civil engineer, Media planner, and Handouts Education comprehension: verbalized understanding, returned demonstration, and needs further education  HOME EXERCISE PROGRAM: Access Code:  [email or web address] URL: [email or web address] Date: 01/22/2023 Prepared by: Deniece Ree  Exercises - Supine Posterior Pelvic Tilt  - 2 x daily - 7 x weekly - 1 sets - 10 reps - 5 hold - Supine Single Knee to Chest Stretch  - 2 x daily - 7 x weekly - 1 sets - 5 reps - 5 hold - Supine Bridge  - 2 x daily - 7 x weekly - 1 sets - 10 reps - Supine Lower Trunk Rotation  - 2 x daily - 7 x weekly - 1 sets - 10 reps - 5 hold  ASSESSMENT:  CLINICAL IMPRESSION: Patient is a 47 y.o. F who was seen today for physical therapy evaluation and treatment for low back pain. Exam reveals significant lumbar ROM and L hip mobility limitations, postural impairments, significant functional muscle weakness, impaired muscle flexibility, multiple areas of muscle spasm, and significant guarding due to pain. Will benefit from skilled PT services to address all limitations, reduce pain, and assist in return to optimal level of function moving forward.   OBJECTIVE IMPAIRMENTS: Abnormal gait, decreased mobility, difficulty walking, decreased ROM, decreased strength, hypomobility, increased fascial restrictions, increased muscle spasms, impaired flexibility, postural dysfunction, and pain.   ACTIVITY LIMITATIONS: carrying, lifting, standing, squatting, stairs, reach over head, locomotion level, and caring for others  PARTICIPATION LIMITATIONS: shopping, community activity,  occupation, and yard work  PERSONAL FACTORS: Age, Behavior pattern, Fitness, Past/current experiences, and Time since onset of injury/illness/exacerbation are also affecting patient's functional outcome.   REHAB POTENTIAL: Good  CLINICAL DECISION MAKING: Stable/uncomplicated  EVALUATION COMPLEXITY: Low   GOALS: Goals reviewed with patient? Yes  SHORT TERM GOALS: Target date: 03/05/2023    Will be compliant with appropriate progressive HEP  Baseline: Goal status: INITIAL  2.  Lumbar pain to be no more than 5/10 at worst  Baseline:   Goal status: INITIAL  3.  Lumbar ROM to be WNL all planes of motion with no increased pain  Baseline:  Goal status: INITIAL  4.  L LE flexibility and hip mobility to have improved by at least 50% in order to assist in reducing pain  Baseline:  Goal status: INITIAL  5.  Will demonstrate good functional biomechanics for bed mobility and floor to waist height lifting  Baseline:  Goal status: INITIAL   LONG TERM GOALS: Target date: 03/05/2023    MMT to have improved by at least one grade in all weak groups  Baseline:  Goal status: INITIAL  2.  Will be able to sleep through the night without being woken due to back pain  Baseline:  Goal status: INITIAL  3.  Will be able to perform all functional work duties without increase in back pain  Baseline:  Goal status: INITIAL  4.  Will be compliant with appropriate advanced HEP vs gym level program to maintain functional gains and prevent recurrence of condition  Baseline:  Goal status: INITIAL   PLAN:  PT FREQUENCY: 1-2x/week  PT DURATION: 6 weeks  PLANNED INTERVENTIONS: Therapeutic exercises, Therapeutic activity, Neuromuscular re-education, Balance training, Gait training, Patient/Family education, Self Care, Joint mobilization, Aquatic Therapy, Dry Needling, Electrical stimulation, Cryotherapy, Moist heat, Taping, Ultrasound, Ionotophoresis 4mg /ml Dexamethasone, Manual therapy, and Re-evaluation.  PLAN FOR NEXT SESSION: lumbar ROM, general flexibility, strength, biomechanics, postural training. Consider DN. Needs weekly HEP updates, has high co-pay   Deniece Ree PT DPT PN2

## 2023-01-27 ENCOUNTER — Encounter: Payer: Self-pay | Admitting: Internal Medicine

## 2023-02-02 ENCOUNTER — Ambulatory Visit (INDEPENDENT_AMBULATORY_CARE_PROVIDER_SITE_OTHER): Payer: 59 | Admitting: Mental Health

## 2023-02-02 DIAGNOSIS — F3181 Bipolar II disorder: Secondary | ICD-10-CM

## 2023-02-02 DIAGNOSIS — F419 Anxiety disorder, unspecified: Secondary | ICD-10-CM

## 2023-02-02 NOTE — Progress Notes (Unsigned)
   THERAPIST PROGRESS NOTE  Session Time: 3:36 pm   Participation Level: Active  Behavioral Response: CasualAlertWNL  Type of Therapy: Individual Therapy  Treatment Goals addressed: STG: [place] will increase management of moods and stressors AEB development of x 3 effective coping skills with ability to process stressors in effective manner per self report within the next 90 days   ProgressTowards Goals: Progressing  Interventions: CBT and Supportive  Summary: ASON STURM is a 47 y.o. female who presents with dx of bipolar disorder and anxiety. Presents for session alert and oriented; mood and affect adequate. Speech clear and coherent at normal rate and tone. Engaged and receptive to interventions. Denies excessive highs or lows. Reports ongoing concern related to medical concerns and need for surgery on shoulder and other areas. Shares concern for having surgery due to need for x 6 months recovery and inability to be out of work that long. Shares to not be permanent at current position for ability to obtain additional benefits from work and hold short term disability to support being out of work for recovery. Notes concern for ability to make it to the time where she will be able to be hired on full time if shoulder continues to deteriorate. Explores with therapist ability to engage in work that is not so physically demanding on her body but shares difficulty in obtaining positions that are not physical to some degree. Shares working to balance concern for stressors with engagement with friends and family for distraction and encouragement. Shares increase in sleeping behaviors with obtaining CPAP machine. Denies SI/HI. GAD: 12 (previously 13) PHQ: 3 (previously 8). Sxs stable; progress with goals with self report of working to process stressors.   Suicidal/Homicidal: Nowithout intent/plan  Therapist Response: Therapist engaged [place] in therapy session. Completed check in and assessed for  current level of functioning, sxs management and level of stressors.  Explored ability to engage coping skills and ability to navigate stressors adequately. Explored ability to process thoughts in balanced manner and presence of negative/unhelpful thinking patterns. Explored ability to follow through with recommendations of providers. Engaged Delonda in exploration of ability to engage in employment with reduction in physicality. Provided support and encouragement; validated feelings. Reviewed session and provided follow up appointment. Encouraged ongoing engagement in balance in daily life and engagement in activities in which she enjoys and engagement with social supports. Assessed for safety.   Plan: Return again in   x 4 weeks.  Diagnosis: Bipolar 2 disorder, major depressive episode  Anxiety  Collaboration of Care: Other [facility]  Patient/Guardian was advised Release of Information must be obtained prior to any record release in order to collaborate their care with an outside provider. Patient/Guardian was advised if they have not already done so to contact the registration department to sign all necessary forms in order for [place] to release information regarding their care.   Consent: Patient/Guardian gives verbal consent for treatment and assignment of benefits for services provided during this visit. Patient/Guardian expressed understanding and agreed to proceed.   Rockey Situ [place], [facility] 02/02/2023

## 2023-02-03 ENCOUNTER — Encounter: Payer: Self-pay | Admitting: Physical Therapy

## 2023-02-03 ENCOUNTER — Ambulatory Visit (INDEPENDENT_AMBULATORY_CARE_PROVIDER_SITE_OTHER): Payer: 59 | Admitting: Physical Therapy

## 2023-02-03 DIAGNOSIS — R29898 Other symptoms and signs involving the musculoskeletal system: Secondary | ICD-10-CM | POA: Diagnosis not present

## 2023-02-03 DIAGNOSIS — M5459 Other low back pain: Secondary | ICD-10-CM

## 2023-02-03 DIAGNOSIS — M6281 Muscle weakness (generalized): Secondary | ICD-10-CM

## 2023-02-03 NOTE — Therapy (Signed)
OUTPATIENT PHYSICAL THERAPY THORACOLUMBAR TREATMENT   Patient Name: Theresa Cantrell MRN: [id number] DOB:Dec 15, 1975, 47 y.o., female Today's Date: 02/03/2023  END OF SESSION:  PT End of Session - 02/03/23 1530     Visit Number 2    Number of Visits 13    Date for PT Re-Evaluation 03/05/23    Authorization Type [employer]    Authorization Time Period 01/22/23 to 03/05/23    Authorization - Number of Visits 30    PT Start Time 1518    PT Stop Time 1601    PT Time Calculation (min) 43 min    Activity Tolerance Patient tolerated treatment well    Behavior During Therapy WFL for tasks assessed/performed              Past Medical History:  Diagnosis Date   Anxiety    Arthritis    Bipolar 1 disorder    Depression    GERD (gastroesophageal reflux disease)    Hypertension    PTSD (post-traumatic stress disorder)    Past Surgical History:  Procedure Laterality Date   ABDOMINAL HYSTERECTOMY     ANTERIOR CERVICAL DECOMP/DISCECTOMY FUSION N/A 06/06/2021   Procedure: C4-5, C5-6 ANTERIOR CERVICAL DISCECTOMY FUSION, ALLOGRAFT, PLATE;  Surgeon: Marybelle Killings, MD;  Location: [place];  Service: Orthopedics;  Laterality: N/A;   CHOLECYSTECTOMY     DILATION AND CURETTAGE OF UTERUS     FOOT SURGERY Bilateral    two pins in each foot   JOINT REPLACEMENT Left 2018   partial knee replacement   KNEE SURGERY Left 2018   partial knee replacement   NECK SURGERY  06/06/2021   C4-5, C5-6 ANTERIOR CERVICAL DISCECTOMY FUSION, ALLOGRAFT, PLATE   Patient Active Problem List   Diagnosis Date Noted   Depression with anxiety 11/30/2022   Partial thickness rotator cuff tear 11/16/2022   Adjustment disorder with mixed anxiety and depressed mood 02/11/2022   Rotator cuff tendinitis, right 11/14/2021   S/P cervical spinal fusion 06/17/2021   Spinal stenosis of cervical region 04/02/2021   COVID-19 vaccine series not completed 12/20/2020   Influenza vaccine refused 11/07/2020   Essential hypertension  11/07/2020   Obesity (BMI 30.0-34.9) 11/07/2020   Tobacco dependence 11/07/2020   Gastroesophageal reflux disease without esophagitis 11/07/2020   History of abnormal cervical Pap smear 11/07/2020   History of bipolar disorder 11/07/2020   Bipolar 2 disorder, major depressive episode 06/12/2020   PTSD (post-traumatic stress disorder) 06/12/2020   Chronic pain of left knee 10/05/2019   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   Neuropathy 02/07/2014   Hot flashes 02/07/2014   Weight loss 02/07/2014   Anxiety     PCP: Karle Plumber MD   REFERRING PROVIDER: Marybelle Killings, MD  REFERRING DIAG:  M54.50 (ICD-10-CM) - Acute bilateral low back pain, unspecified whether sciatica present    Rationale for Evaluation and Treatment: Rehabilitation  THERAPY DIAG:  Other low back pain  Muscle weakness (generalized)  Other symptoms and signs involving the musculoskeletal system  ONSET DATE: 01/12/2023  SUBJECTIVE:  SUBJECTIVE STATEMENT:  Things are feeling OK today, last night I wasn't able to sleep much maybe because of the rain.  HEP is going pretty well it just takes me a little. Not sure if they're helping yet or not. Still hurting off and on. Had some really excruciating pain on the left side of my low back when trying to turn over last night. Had sharp pain when trying to lay on the left side last night.   PERTINENT HISTORY:  Subjective:    Chief Complaint  Patient presents with   Right Shoulder - Follow-up   Neck - Follow-up   Lower Back - Pain      HPI 47 year old female returns she states her shoulder is doing a little bit better she can lift it up overhead mild discomfort.  Previous scan showed low-grade partial tearing of the supraspinatus.  She states she has had  increased problems with her lower back pain which has been present for about a month it is giving her more problems in her shoulder.  Previous two-level cervical fusion C4-5 C5-6 doing well.   Patient has trouble when she first gets up stiffness in her back.  Discomfort with turning twisting.  No associated bowel or bladder symptoms no chills or fever.  PAIN:  Are you having pain? Yes: NPRS scale: 4-5/10 Pain location: mid low back and L low back  Pain description: faint pain that's light but comes and goes, intensity is a little better  Aggravating factors: rolling over in bed, trying to twist to the left   Relieving factors: walking around, movement, muscle pain medicine and mm relaxer   PRECAUTIONS: None  WEIGHT BEARING RESTRICTIONS: No  FALLS:  Has patient fallen in last 6 months? 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 Lorin Mercy PRN   OBJECTIVE:   DIAGNOSTIC FINDINGS:    PATIENT SURVEYS:  FOTO 43.6  SCREENING FOR RED FLAGS: Bowel or bladder incontinence: No Spinal tumors: No Cauda equina syndrome: No Compression fracture: No Abdominal aneurysm: No  COGNITION: Overall cognitive status: Within functional limits for tasks assessed     SENSATION: Not tested  MUSCLE LENGTH:  R quad mild limitation, L quad severe limitation but very guarded HS R mild limitation, HS L moderate limitation Piriformis R mild limitation, piriformis L severe limitation   LLD (-)   POSTURE: rounded shoulders, forward head, decreased lumbar lordosis, and increased thoracic  kyphosis  PALPATION:  Significant muscle spasms L lateral thigh, L glute, L paraspinals   LUMBAR ROM:   AROM eval  Flexion Moderate limitation; RFIS pain goes into L hip   Extension Moderate limitation/compensations at hips; REIS no change in pain   Right lateral flexion Moderate limitation   Left lateral flexion WNL   Right rotation WNL   Left rotation Severe limitation    (Blank rows = not tested)  LOWER EXTREMITY ROM:     Active  Right eval Left eval  Hip flexion    Hip extension    Hip abduction    Hip adduction    Hip internal rotation  Hip external rotation    Knee flexion    Knee extension    Ankle dorsiflexion    Ankle plantarflexion    Ankle inversion    Ankle eversion     (Blank rows = not tested)  LOWER EXTREMITY MMT:    MMT Right eval Left eval  Hip flexion 5 3 pain limited   Hip extension    Hip abduction 5 3+ pain limited   Hip adduction    Hip internal rotation    Hip external rotation    Knee flexion 4+ 4-  Knee extension 5 4  Ankle dorsiflexion 5 5  Ankle plantarflexion    Ankle inversion    Ankle eversion     (Blank rows = not tested)  LUMBAR SPECIAL TESTS:  Straight leg raise test: Negative  FUNCTIONAL TESTS:    GAIT:   TODAY'S TREATMENT:                                                                                                                              DATE:   02/03/23  TherEx  Nustep L2 x6 minutes BLEs only  Bridges x10 with 2 second holds Hip isometric extensions into mat table x10 B PPT x10 with 2 second hold- stopped early due to peripheralization of  pain QL stretch 3x30 seconds no increase in pain  QL stretch with bias R for better L QL stretch 1x30 seconds- stopped after 1 rep, peripheralization of pain Standing lumbar extensions x10 with cues to avoid hip compensations x10 centralization of pain  Progressed lumbar extensions to on wall (unable to do pushup up on elbows due to shoulder pain)- increase  in pain Repeat of standing lumbar extensions x10 with centralization of pain Prone hip extensions with bent knee x5 B     Eval  Objective measures + appropriate education   TherEx  Nustep L1 x6 minutes BLEs only PPT x10 with 5 second holds SKTC x5 L LE 5 second holds Bridges with L LE closer to rear x5 Lumbar rotation stretch for L side 5x5 second holds      PATIENT EDUCATION:  Education details: exam findings, POC, HEP Person educated: Patient Education method: Consulting civil engineer, Media planner, and Handouts Education comprehension: verbalized understanding, returned demonstration, and needs further education  HOME EXERCISE PROGRAM:  Access Code: [email or web address] URL: [email or web address] Date: 02/03/2023 Prepared by: Deniece Ree  Exercises - Supine Bridge  - 2 x daily - 7 x weekly - 1 sets - 10 reps - Seated Quadratus Lumborum Stretch in Chair  - 2 x daily - 7 x weekly - 1 sets - 4 reps - 30 hold - Standing Lumbar Extension  - 2 x daily - 7 x weekly - 3 sets - 10 reps - Prone Hip Extension with Bent Knee  - 2 x daily - 7 x weekly - 1 sets - 10 reps - 2 hold  ASSESSMENT:  CLINICAL IMPRESSION:  Tenille arrives today doing OK, still  having quite a bit of pain in her back; sounds like lumbar rotation stretch may be irritating her pain a bit. Reviewed other HEP exercises and found similar result- revamped entire HEP to focus on extension based programming as this motion was generally more tolerable to her during session today and seemed to improve pain somewhat. Otherwise focused on strengthening as tolerated today along with extension based movements.  Hopefully the changes we made to her HEP will get her feeling a bit better.    OBJECTIVE IMPAIRMENTS: Abnormal gait, decreased mobility, difficulty walking, decreased ROM, decreased strength, hypomobility, increased fascial restrictions, increased muscle spasms, impaired flexibility, postural dysfunction, and pain.    ACTIVITY LIMITATIONS: carrying, lifting, standing, squatting, stairs, reach over head, locomotion level, and caring for others  PARTICIPATION LIMITATIONS: shopping, community activity, occupation, and yard work  PERSONAL FACTORS: Age, Behavior pattern, Fitness, Past/current experiences, and Time since onset of injury/illness/exacerbation are also affecting patient's functional outcome.   REHAB POTENTIAL: Good  CLINICAL DECISION MAKING: Stable/uncomplicated  EVALUATION COMPLEXITY: Low   GOALS: Goals reviewed with patient? Yes  SHORT TERM GOALS: Target date: 03/05/2023    Will be compliant with appropriate progressive HEP  Baseline: Goal status: INITIAL  2.  Lumbar pain to be no more than 5/10 at worst  Baseline:  Goal status: INITIAL  3.  Lumbar ROM to be WNL all planes of motion with no increased pain  Baseline:  Goal status: INITIAL  4.  L LE flexibility and hip mobility to have improved by at least 50% in order to assist in reducing pain  Baseline:  Goal status: INITIAL  5.  Will demonstrate good functional biomechanics for bed mobility and floor to waist height lifting  Baseline:  Goal status: INITIAL   LONG TERM GOALS: Target date: 03/05/2023    MMT to have improved by at least one grade in all weak groups  Baseline:  Goal status: INITIAL  2.  Will be able to sleep through the night without being woken due to back pain  Baseline:  Goal status: INITIAL  3.  Will be able to perform all functional work duties without increase in back pain  Baseline:  Goal status: INITIAL  4.  Will be compliant with appropriate advanced HEP vs gym level program to maintain functional gains and prevent recurrence of condition  Baseline:  Goal status: INITIAL   PLAN:  PT FREQUENCY: 1-2x/week  PT DURATION: 6 weeks  PLANNED INTERVENTIONS: Therapeutic exercises, Therapeutic activity, Neuromuscular re-education, Balance training, Gait training, Patient/Family education,  Self Care, Joint mobilization, Aquatic Therapy, Dry Needling, Electrical stimulation, Cryotherapy, Moist heat, Taping, Ultrasound, Ionotophoresis 4mg /ml Dexamethasone, Manual therapy, and Re-evaluation.  PLAN FOR NEXT SESSION: lumbar ROM, general flexibility, strength, biomechanics, postural training. Consider DN. Needs weekly HEP updates, has high co-pay. How did she feel with new HEP, is it helping more?   Deniece Ree PT DPT PN2

## 2023-02-12 ENCOUNTER — Other Ambulatory Visit: Payer: Self-pay | Admitting: Internal Medicine

## 2023-02-12 ENCOUNTER — Ambulatory Visit (INDEPENDENT_AMBULATORY_CARE_PROVIDER_SITE_OTHER): Payer: 59 | Admitting: Physical Therapy

## 2023-02-12 ENCOUNTER — Encounter: Payer: Self-pay | Admitting: Physical Therapy

## 2023-02-12 DIAGNOSIS — M5459 Other low back pain: Secondary | ICD-10-CM | POA: Diagnosis not present

## 2023-02-12 DIAGNOSIS — R29898 Other symptoms and signs involving the musculoskeletal system: Secondary | ICD-10-CM | POA: Diagnosis not present

## 2023-02-12 DIAGNOSIS — M6281 Muscle weakness (generalized): Secondary | ICD-10-CM

## 2023-02-12 DIAGNOSIS — M545 Low back pain, unspecified: Secondary | ICD-10-CM

## 2023-02-12 NOTE — Telephone Encounter (Signed)
Medication Refill - Medication: cyclobenzaprine (FLEXERIL) 5 MG tablet [[id number]]   Pt reports that she has 2 pills left  Has the patient contacted their pharmacy? Yes.   (Agent: If no, request that the patient contact the pharmacy for the refill. If patient does not wish to contact the pharmacy document the reason why and proceed with request.) (Agent: If yes, when and what did the pharmacy advise?)  Preferred Pharmacy (with phone number or street name):  [facility] [address] [place] - [address] [place] [address] Phone: [phone]  Fax: [phone]   Has the patient been seen for an appointment in the last year OR does the patient have an upcoming appointment? Yes.    Agent: Please be advised that RX refills may take up to 3 business days. We ask that you follow-up with your pharmacy.

## 2023-02-12 NOTE — Therapy (Signed)
OUTPATIENT PHYSICAL THERAPY THORACOLUMBAR TREATMENT   Patient Name: STEPHENIE Cantrell MRN: [id number] DOB:11-17-1975, 47 y.o., female Today's Date: 02/12/2023  END OF SESSION:  PT End of Session - 02/12/23 1524     Visit Number 3    Number of Visits 13    Date for PT Re-Evaluation 03/05/23    Authorization Type [employer]    Authorization Time Period 01/22/23 to 03/05/23    Authorization - Number of Visits 30    PT Start Time 1514    PT Stop Time 1556    PT Time Calculation (min) 42 min    Activity Tolerance Patient tolerated treatment well    Behavior During Therapy WFL for tasks assessed/performed               Past Medical History:  Diagnosis Date   Anxiety    Arthritis    Bipolar 1 disorder    Depression    GERD (gastroesophageal reflux disease)    Hypertension    PTSD (post-traumatic stress disorder)    Past Surgical History:  Procedure Laterality Date   ABDOMINAL HYSTERECTOMY     ANTERIOR CERVICAL DECOMP/DISCECTOMY FUSION N/A 06/06/2021   Procedure: C4-5, C5-6 ANTERIOR CERVICAL DISCECTOMY FUSION, ALLOGRAFT, PLATE;  Surgeon: Eldred Manges, MD;  Location: [facility] OR;  Service: Orthopedics;  Laterality: N/A;   CHOLECYSTECTOMY     DILATION AND CURETTAGE OF UTERUS     FOOT SURGERY Bilateral    two pins in each foot   JOINT REPLACEMENT Left 2018   partial knee replacement   KNEE SURGERY Left 2018   partial knee replacement   NECK SURGERY  06/06/2021   C4-5, C5-6 ANTERIOR CERVICAL DISCECTOMY FUSION, ALLOGRAFT, PLATE   Patient Active Problem List   Diagnosis Date Noted   Depression with anxiety 11/30/2022   Partial thickness rotator cuff tear 11/16/2022   Adjustment disorder with mixed anxiety and depressed mood 02/11/2022   Rotator cuff tendinitis, right 11/14/2021   S/P cervical spinal fusion 06/17/2021   Spinal stenosis of cervical region 04/02/2021   COVID-19 vaccine series not completed 12/20/2020   Influenza vaccine refused 11/07/2020   Essential hypertension  11/07/2020   Obesity (BMI 30.0-34.9) 11/07/2020   Tobacco dependence 11/07/2020   Gastroesophageal reflux disease without esophagitis 11/07/2020   History of abnormal cervical Pap smear 11/07/2020   History of bipolar disorder 11/07/2020   Bipolar 2 disorder, major depressive episode 06/12/2020   PTSD (post-traumatic stress disorder) 06/12/2020   Chronic pain of left knee 10/05/2019   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   Neuropathy 02/07/2014   Hot flashes 02/07/2014   Weight loss 02/07/2014   Anxiety     PCP: Jonah Blue MD   REFERRING PROVIDER: Eldred Manges, MD  REFERRING DIAG:  M54.50 (ICD-10-CM) - Acute bilateral low back pain, unspecified whether sciatica present    Rationale for Evaluation and Treatment: Rehabilitation  THERAPY DIAG:  Other low back pain  Muscle weakness (generalized)  Other symptoms and signs involving the musculoskeletal system  ONSET DATE: 01/12/2023  SUBJECTIVE:  SUBJECTIVE STATEMENT:  This was a bad week, for some reason for the past 3 days the right side of my back has been starting to hurt. When the rain got ready to start, my back kicked. I was doing pretty OK at home before the pain, new HEP was doing pretty good. Now that the rain is out of here the back is still there but not as bad as its been. Exercises have been helping some, movement helps too.   PERTINENT HISTORY:  Subjective:    Chief Complaint  Patient presents with   Right Shoulder - Follow-up   Neck - Follow-up   Lower Back - Pain      HPI 47 year old female returns she states her shoulder is doing a little bit better she can lift it up overhead mild discomfort.  Previous scan showed low-grade partial tearing of the supraspinatus.  She states she has had  increased problems with her lower back pain which has been present for about a month it is giving her more problems in her shoulder.  Previous two-level cervical fusion C4-5 C5-6 doing well.   Patient has trouble when she first gets up stiffness in her back.  Discomfort with turning twisting.  No associated bowel or bladder symptoms no chills or fever.  PAIN:  Are you having pain? Yes: NPRS scale: 6-7/10 Pain location: R side not going into leg  Pain description: dull pain  Aggravating factors: rolling over in bed  Relieving factors: exercise, movement  PRECAUTIONS: None  WEIGHT BEARING RESTRICTIONS: No  FALLS:  Has patient fallen in last 6 months? 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 Ophelia Charter PRN   OBJECTIVE:   DIAGNOSTIC FINDINGS:    PATIENT SURVEYS:  FOTO 43.6  SCREENING FOR RED FLAGS: Bowel or bladder incontinence: No Spinal tumors: No Cauda equina syndrome: No Compression fracture: No Abdominal aneurysm: No  COGNITION: Overall cognitive status: Within functional limits for tasks assessed     SENSATION: Not tested  MUSCLE LENGTH:  R quad mild limitation, L quad severe limitation but very guarded HS R mild limitation, HS L moderate limitation Piriformis R mild limitation, piriformis L severe limitation   LLD (-)   POSTURE: rounded shoulders, forward head, decreased lumbar lordosis, and increased thoracic kyphosis  PALPATION:  Significant muscle spasms L lateral thigh, L glute, L paraspinals   LUMBAR ROM:   AROM eval  Flexion Moderate  limitation; RFIS pain goes into L hip   Extension Moderate limitation/compensations at hips; REIS no change in pain   Right lateral flexion Moderate limitation   Left lateral flexion WNL   Right rotation WNL   Left rotation Severe limitation    (Blank rows = not tested)  LOWER EXTREMITY ROM:     Active  Right eval Left eval  Hip flexion    Hip extension    Hip abduction    Hip adduction    Hip internal rotation    Hip external rotation    Knee flexion    Knee extension    Ankle dorsiflexion    Ankle plantarflexion  Ankle inversion    Ankle eversion     (Blank rows = not tested)  LOWER EXTREMITY MMT:    MMT Right eval Left eval  Hip flexion 5 3 pain limited   Hip extension    Hip abduction 5 3+ pain limited   Hip adduction    Hip internal rotation    Hip external rotation    Knee flexion 4+ 4-  Knee extension 5 4  Ankle dorsiflexion 5 5  Ankle plantarflexion    Ankle inversion    Ankle eversion     (Blank rows = not tested)  LUMBAR SPECIAL TESTS:  Straight leg raise test: Negative  FUNCTIONAL TESTS:    GAIT:   TODAY'S TREATMENT:                                                                                                                              DATE:   02/12/23  TherEx  Nustep L5 x6 minutes BLEs only Standing lumbar extensions x15 Hip flexor stretch allowing back to arch 3x30 seconds B Prone opposite UE/LE raises x10 B Supermans x10 arms by side 1 second hold Seated TA sets x20 with 5 second holds Seated TA set + march x15 Seated TA set + opposite UE punch x10 B Intro to deadlifts/lifting mechanics 1# bar x10 MOD cues  Second round of lumbar extensions x10 at EOS     02/03/23  TherEx  Nustep L2 x6 minutes BLEs only  Bridges x10 with 2 second holds Hip isometric extensions into mat table x10 B PPT x10 with 2 second hold- stopped early due to peripheralization of  pain QL stretch 3x30 seconds no increase in pain  QL stretch  with bias R for better L QL stretch 1x30 seconds- stopped after 1 rep, peripheralization of pain Standing lumbar extensions x10 with cues to avoid hip compensations x10 centralization of pain  Progressed lumbar extensions to on wall (unable to do pushup up on elbows due to shoulder pain)- increase in pain Repeat of standing lumbar extensions x10 with centralization of pain Prone hip extensions with bent knee x5 B     Eval  Objective measures + appropriate education   TherEx  Nustep L1 x6 minutes BLEs only PPT x10 with 5 second holds SKTC x5 L LE 5 second holds Bridges with L LE closer to rear x5 Lumbar rotation stretch for L side 5x5 second holds      PATIENT EDUCATION:  Education details: exam findings, POC, HEP Person educated: Patient Education method: Programmer, multimedia, Facilities manager, and Handouts Education comprehension: verbalized understanding, returned demonstration, and needs further education  HOME EXERCISE PROGRAM:  Access Code: [email or web address] URL: [email or web address] Date: 02/12/2023 Prepared by: Nedra Hai  Exercises - Supine Bridge  - 2 x daily - 7 x weekly - 1 sets - 10 reps - Seated Quadratus Lumborum Stretch in Chair  - 2 x daily - 7 x weekly - 1 sets - 4 reps - 30  hold - Standing Lumbar Extension  - 2 x daily - 7 x weekly - 3 sets - 10 reps - Prone Hip Extension with Bent Knee  - 2 x daily - 7 x weekly - 1 sets - 10 reps - 2 hold - Seated Transversus Abdominis Bracing  - 2 x daily - 7 x weekly - 1 sets - 15-20 reps - 5 hold - Superman on Table  - 1 x daily - 7 x weekly - 3 sets - 10 reps  ASSESSMENT:  CLINICAL IMPRESSION:  Kristee arrives today doing OK, sounds like her pain may be continuing to centralize as it is now more on the right side of her back but no longer down her legs. Continued with extension based programming with progressions as tolerated, also increased focus on core strengthening and introduction to biomechanics. Able to cut pain  score in half with extension based exercises today. Added some core strengthening to HEP today as well. Will continue to progress as tolerated.    OBJECTIVE IMPAIRMENTS: Abnormal gait, decreased mobility, difficulty walking, decreased ROM, decreased strength, hypomobility, increased fascial restrictions, increased muscle spasms, impaired flexibility, postural dysfunction, and pain.   ACTIVITY LIMITATIONS: carrying, lifting, standing, squatting, stairs, reach over head, locomotion level, and caring for others  PARTICIPATION LIMITATIONS: shopping, community activity, occupation, and yard work  PERSONAL FACTORS: Age, Behavior pattern, Fitness, Past/current experiences, and Time since onset of injury/illness/exacerbation are also affecting patient's functional outcome.   REHAB POTENTIAL: Good  CLINICAL DECISION MAKING: Stable/uncomplicated  EVALUATION COMPLEXITY: Low   GOALS: Goals reviewed with patient? Yes  SHORT TERM GOALS: Target date: 03/05/2023    Will be compliant with appropriate progressive HEP  Baseline: Goal status: INITIAL  2.  Lumbar pain to be no more than 5/10 at worst  Baseline:  Goal status: INITIAL  3.  Lumbar ROM to be WNL all planes of motion with no increased pain  Baseline:  Goal status: INITIAL  4.  L LE flexibility and hip mobility to have improved by at least 50% in order to assist in reducing pain  Baseline:  Goal status: INITIAL  5.  Will demonstrate good functional biomechanics for bed mobility and floor to waist height lifting  Baseline:  Goal status: INITIAL   LONG TERM GOALS: Target date: 03/05/2023    MMT to have improved by at least one grade in all weak groups  Baseline:  Goal status: INITIAL  2.  Will be able to sleep through the night without being woken due to back pain  Baseline:  Goal status: INITIAL  3.  Will be able to perform all functional work duties without increase in back pain  Baseline:  Goal status: INITIAL  4.   Will be compliant with appropriate advanced HEP vs gym level program to maintain functional gains and prevent recurrence of condition  Baseline:  Goal status: INITIAL   PLAN:  PT FREQUENCY: 1-2x/week  PT DURATION: 6 weeks  PLANNED INTERVENTIONS: Therapeutic exercises, Therapeutic activity, Neuromuscular re-education, Balance training, Gait training, Patient/Family education, Self Care, Joint mobilization, Aquatic Therapy, Dry Needling, Electrical stimulation, Cryotherapy, Moist heat, Taping, Ultrasound, Ionotophoresis 4mg /ml Dexamethasone, Manual therapy, and Re-evaluation.  PLAN FOR NEXT SESSION: Focus on extension based programming, core strength, biomechanics. Consider DN. Needs weekly HEP updates, has high co-pay.   Nedra Hai PT DPT PN2

## 2023-02-13 ENCOUNTER — Other Ambulatory Visit: Payer: Self-pay | Admitting: Orthopaedic Surgery

## 2023-02-15 MED ORDER — CYCLOBENZAPRINE HCL 5 MG PO TABS: ORAL_TABLET | ORAL | 0 refills | Status: DC

## 2023-02-15 NOTE — Telephone Encounter (Signed)
Requested medication (s) are due for refill today: Yes  Requested medication (s) are on the active medication list: Yes  Last refill:  08/24/22  Future visit scheduled: Yes  Notes to clinic:  Unable to refill per protocol, cannot delegate.      Requested Prescriptions  Pending Prescriptions Disp Refills   cyclobenzaprine (FLEXERIL) 5 MG tablet 30 tablet 1    Sig: TAKE 1 TABLET BY MOUTH ONCE DAILY AS NEEDED FOR MUSCLE SPASM     Not Delegated - Analgesics:  Muscle Relaxants Failed - 02/15/2023  8:06 AM      Failed - This refill cannot be delegated      Passed - Valid encounter within last 6 months    Recent Outpatient Visits           2 months ago Throat discomfort   [facility] Marcine Matar, MD   5 months ago Essential hypertension    [facility] Marcine Matar, MD   10 months ago Essential hypertension   [facility], Cornelius Moras, RPH-CPP   11 months ago Essential hypertension   [facility], Cornelius Moras, RPH-CPP   11 months ago Acute bilateral low back pain without sciatica   [facility] Marcine Matar, MD       Future Appointments             In 1 week Marcine Matar, MD [facility]

## 2023-02-17 ENCOUNTER — Encounter: Payer: 59 | Admitting: Physical Therapy

## 2023-02-23 ENCOUNTER — Ambulatory Visit: Payer: PRIVATE HEALTH INSURANCE | Admitting: Internal Medicine

## 2023-02-26 ENCOUNTER — Ambulatory Visit (INDEPENDENT_AMBULATORY_CARE_PROVIDER_SITE_OTHER): Payer: 59 | Admitting: Physical Therapy

## 2023-02-26 ENCOUNTER — Encounter: Payer: Self-pay | Admitting: Physical Therapy

## 2023-02-26 ENCOUNTER — Telehealth: Payer: Self-pay | Admitting: Radiology

## 2023-02-26 DIAGNOSIS — M6281 Muscle weakness (generalized): Secondary | ICD-10-CM | POA: Diagnosis not present

## 2023-02-26 DIAGNOSIS — R29898 Other symptoms and signs involving the musculoskeletal system: Secondary | ICD-10-CM | POA: Diagnosis not present

## 2023-02-26 DIAGNOSIS — M545 Low back pain, unspecified: Secondary | ICD-10-CM

## 2023-02-26 DIAGNOSIS — M5459 Other low back pain: Secondary | ICD-10-CM

## 2023-02-26 NOTE — Telephone Encounter (Signed)
I left  voicemail for patient. Dr. Ophelia Charter received note from physical therapy that patient's visits were being put on hold until after follow up with him due to some negative progress.  He would like to know if patient would like to proceed with MRI lumbar spine.  Will await return call.

## 2023-02-26 NOTE — Therapy (Addendum)
OUTPATIENT PHYSICAL THERAPY THORACOLUMBAR TREATMENT  /DISCHARGE   Patient Name: Theresa Cantrell MRN: [id number] DOB:Mar 17, 1976, 47 y.o., female Today's Date: 02/26/2023  END OF SESSION:  PT End of Session - 02/26/23 1526     Visit Number 4    Number of Visits 13    Date for PT Re-Evaluation 03/05/23    Authorization Type [employer]    Authorization Time Period 01/22/23 to 03/05/23    Authorization - Number of Visits 30    PT Start Time 1517    PT Stop Time 1547    PT Time Calculation (min) 30 min    Activity Tolerance Patient tolerated treatment well    Behavior During Therapy WFL for tasks assessed/performed                Past Medical History:  Diagnosis Date   Anxiety    Arthritis    Bipolar 1 disorder (HCC)    Depression    GERD (gastroesophageal reflux disease)    Hypertension    PTSD (post-traumatic stress disorder)    Past Surgical History:  Procedure Laterality Date   ABDOMINAL HYSTERECTOMY     ANTERIOR CERVICAL DECOMP/DISCECTOMY FUSION N/A 06/06/2021   Procedure: C4-5, C5-6 ANTERIOR CERVICAL DISCECTOMY FUSION, ALLOGRAFT, PLATE;  Surgeon: Eldred Manges, MD;  Location: [facility] OR;  Service: Orthopedics;  Laterality: N/A;   CHOLECYSTECTOMY     DILATION AND CURETTAGE OF UTERUS     FOOT SURGERY Bilateral    two pins in each foot   JOINT REPLACEMENT Left 2018   partial knee replacement   KNEE SURGERY Left 2018   partial knee replacement   NECK SURGERY  06/06/2021   C4-5, C5-6 ANTERIOR CERVICAL DISCECTOMY FUSION, ALLOGRAFT, PLATE   Patient Active Problem List   Diagnosis Date Noted   Depression with anxiety 11/30/2022   Partial thickness rotator cuff tear 11/16/2022   Adjustment disorder with mixed anxiety and depressed mood 02/11/2022   Rotator cuff tendinitis, right 11/14/2021   S/P cervical spinal fusion 06/17/2021   Spinal stenosis of cervical region 04/02/2021   COVID-19 vaccine series not completed 12/20/2020   Influenza vaccine refused 11/07/2020    Essential hypertension 11/07/2020   Obesity (BMI 30.0-34.9) 11/07/2020   Tobacco dependence 11/07/2020   Gastroesophageal reflux disease without esophagitis 11/07/2020   History of abnormal cervical Pap smear 11/07/2020   History of bipolar disorder 11/07/2020   Bipolar 2 disorder, major depressive episode (HCC) 06/12/2020   PTSD (post-traumatic stress disorder) 06/12/2020   Chronic pain of left knee 10/05/2019   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   Neuropathy 02/07/2014   Hot flashes 02/07/2014   Weight loss 02/07/2014   Anxiety     PCP: Jonah Blue MD   REFERRING PROVIDER: Eldred Manges, MD  REFERRING DIAG:  M54.50 (ICD-10-CM) - Acute bilateral low back pain, unspecified whether sciatica present    Rationale for Evaluation and Treatment: Rehabilitation  THERAPY DIAG:  Other low back pain  Other symptoms and signs involving the musculoskeletal system  Muscle weakness (generalized)  ONSET DATE: 01/12/2023  SUBJECTIVE:  SUBJECTIVE STATEMENT:  I was sick last week. Back has its days, HEP is still helping. I have been working longer hours and I think that's been making my pain worse.    PERTINENT HISTORY:  Subjective:    Chief Complaint  Patient presents with   Right Shoulder - Follow-up   Neck - Follow-up   Lower Back - Pain      HPI 47 year old female returns she states her shoulder is doing a little bit better she can lift it up overhead mild discomfort.  Previous scan showed low-grade partial tearing of the supraspinatus.  She states she has had increased problems with her lower back pain which has been present for about a month it is giving her more problems in her shoulder.  Previous two-level cervical fusion C4-5 C5-6 doing well.   Patient has  trouble when she first gets up stiffness in her back.  Discomfort with turning twisting.  No associated bowel or bladder symptoms no chills or fever.  PAIN:  Are you having pain? Yes: NPRS scale: 6-7/10 Pain location: R lumbar not going in leg, L lumbar going down leg to about knee level Pain description: dull pain, radiating/shooting pain in L LE only Aggravating factors: rolling over in bed, working too long Relieving factors: exercise, movement, heat   PRECAUTIONS: None  WEIGHT BEARING RESTRICTIONS: No  FALLS:  Has patient fallen in last 6 months? 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 Ophelia Charter PRN   OBJECTIVE:   DIAGNOSTIC FINDINGS:    PATIENT SURVEYS:  FOTO 43.6  SCREENING FOR RED FLAGS: Bowel or bladder incontinence: No Spinal tumors: No Cauda equina syndrome: No Compression fracture: No Abdominal aneurysm: No  COGNITION: Overall cognitive status: Within functional limits for tasks assessed     SENSATION: Not tested  MUSCLE LENGTH:  R quad mild limitation, L quad severe limitation but very guarded; 4/26-  DNT  HS R mild limitation, HS L moderate limitation; 4/26- mild HS limits B  Piriformis R mild limitation, piriformis L severe limitation; 4/26-  B piriformis mild limitation   LLD (-)   POSTURE: rounded shoulders, forward head, decreased lumbar lordosis, and increased thoracic kyphosis  PALPATION:  Significant muscle spasms L lateral thigh, L glute, L paraspinals   LUMBAR ROM:   AROM eval 02/26/23  Flexion Moderate limitation; RFIS pain goes  into L hip  Moderate limitation   Extension Moderate limitation/compensations at hips; REIS no change in pain  Mild limitation   Right lateral flexion Moderate limitation  Moderate limitation   Left lateral flexion WNL  Moderate limitation   Right rotation WNL    Left rotation Severe limitation     (Blank rows = not tested)  LOWER EXTREMITY ROM:     Active  Right eval Left eval  Hip flexion    Hip extension    Hip abduction    Hip adduction    Hip internal rotation    Hip external rotation    Knee flexion    Knee extension    Ankle dorsiflexion    Ankle plantarflexion  Ankle inversion    Ankle eversion     (Blank rows = not tested)  LOWER EXTREMITY MMT:    MMT Right eval Left eval 02/26/23 02/26/23  Hip flexion 5 3 pain limited  3+ 3+ pain limited   Hip extension      Hip abduction 5 3+ pain limited  4 3 pain limited   Hip adduction      Hip internal rotation      Hip external rotation      Knee flexion 4+ 4- 4 4-  Knee extension 5 4 4+ 4+  Ankle dorsiflexion 5 5 5 5   Ankle plantarflexion      Ankle inversion      Ankle eversion       (Blank rows = not tested)  LUMBAR SPECIAL TESTS:  Straight leg raise test: Negative  FUNCTIONAL TESTS:    GAIT:   TODAY'S TREATMENT:                                                                                                                              DATE:   02/26/23  Objective measures/goal review + appropriate education, POC   FOTO 33 (was 41 at eval)  TherEx  Nustep L5x6 minutes BLEs only   02/12/23  TherEx  Nustep L5 x6 minutes BLEs only Standing lumbar extensions x15 Hip flexor stretch allowing back to arch 3x30 seconds B Prone opposite UE/LE raises x10 B Supermans x10 arms by side 1 second hold Seated TA sets x20 with 5 second holds Seated TA set + march x15 Seated TA set + opposite UE punch x10 B Intro to deadlifts/lifting mechanics 1# bar x10 MOD cues  Second round of lumbar extensions x10  at EOS     02/03/23  TherEx  Nustep L2 x6 minutes BLEs only  Bridges x10 with 2 second holds Hip isometric extensions into mat table x10 B PPT x10 with 2 second hold- stopped early due to peripheralization of  pain QL stretch 3x30 seconds no increase in pain  QL stretch with bias R for better L QL stretch 1x30 seconds- stopped after 1 rep, peripheralization of pain Standing lumbar extensions x10 with cues to avoid hip compensations x10 centralization of pain  Progressed lumbar extensions to on wall (unable to do pushup up on elbows due to shoulder pain)- increase in pain Repeat of standing lumbar extensions x10 with centralization of pain Prone hip extensions with bent knee x5 B     Eval  Objective measures + appropriate education   TherEx  Nustep L1 x6 minutes BLEs only PPT x10 with 5 second holds SKTC x5 L LE 5 second holds Bridges with L LE closer to rear x5 Lumbar rotation stretch for L side 5x5 second holds      PATIENT EDUCATION:  Education details: exam findings, POC, HEP Person educated: Patient Education method: Explanation, Demonstration, and Handouts Education comprehension: verbalized understanding, returned demonstration, and needs further education  HOME EXERCISE PROGRAM:  Access Code: [email or web address] URL: [email or web address] Date: 02/12/2023 Prepared by: Nedra Hai  Exercises - Supine Bridge  - 2 x daily - 7 x weekly - 1 sets - 10 reps - Seated Quadratus Lumborum Stretch in Chair  - 2 x daily - 7 x weekly - 1 sets - 4 reps - 30 hold - Standing Lumbar Extension  - 2 x daily - 7 x weekly - 3 sets - 10 reps - Prone Hip Extension with Bent Knee  - 2 x daily - 7 x weekly - 1 sets - 10 reps - 2 hold - Seated Transversus Abdominis Bracing  - 2 x daily - 7 x weekly - 1 sets - 15-20 reps - 5 hold - Superman on Table  - 1 x daily - 7 x weekly - 3 sets - 10 reps  ASSESSMENT:  CLINICAL IMPRESSION:  Theresa Cantrell arrives today doing OK, pain is still  staying at a high level. We opted to get formal re-assess today to see if interventions are improving objective measures and physical performance. Per FOTO and all objective measures, patient is not making significant functional progress towards her goals and actually some measures have backslid/worsened. She has been compliant with her HEP and PT advice. Advised her to continue with exercises that have temporarily been relieving pain. Will put her on hold pending MD visit/MRI- if she needs more visits to get MRI, we can certainly work her in.    OBJECTIVE IMPAIRMENTS: Abnormal gait, decreased mobility, difficulty walking, decreased ROM, decreased strength, hypomobility, increased fascial restrictions, increased muscle spasms, impaired flexibility, postural dysfunction, and pain.   ACTIVITY LIMITATIONS: carrying, lifting, standing, squatting, stairs, reach over head, locomotion level, and caring for others  PARTICIPATION LIMITATIONS: shopping, community activity, occupation, and yard work  PERSONAL FACTORS: Age, Behavior pattern, Fitness, Past/current experiences, and Time since onset of injury/illness/exacerbation are also affecting patient's functional outcome.   REHAB POTENTIAL: Good  CLINICAL DECISION MAKING: Stable/uncomplicated  EVALUATION COMPLEXITY: Low   GOALS: Goals reviewed with patient? Yes  SHORT TERM GOALS: Target date: 03/05/2023    Will be compliant with appropriate progressive HEP  Baseline: Goal status: MET 02/26/23  2.  Lumbar pain to be no more than 5/10 at worst  Baseline:  Goal status: NOT MET 02/26/23 6-7/10  3.  Lumbar ROM to be WNL all planes of motion with no increased pain  Baseline:  Goal status: NOT MET 02/26/23  4.  L LE flexibility and hip mobility to have improved by at least 50% in order to assist in reducing pain  Baseline:  Goal status: MET 02/26/23  5.  Will demonstrate good functional biomechanics for bed mobility and floor to waist height  lifting  Baseline:  Goal status: NOT MET 4/26- did not address a lot, focus had been on pain control thus far    LONG TERM GOALS: Target date: 03/05/2023    MMT to have improved by at least one grade in all weak groups  Baseline:  Goal status: NOT MET 02/26/23  2.  Will be able to sleep through the night without being woken due to back pain  Baseline:  Goal status: NOT MET 02/26/23- waking up perhaps 3-4 times per night   3.  Will be able to perform all functional work duties without increase in back pain  Baseline:  Goal status: NOT MET 02/26/23  4.  Will be compliant with appropriate advanced HEP vs gym level program to maintain functional gains and prevent recurrence  of condition  Baseline:  Goal status: NOT MET 02/26/23   PLAN:  PT FREQUENCY: 1-2x/week  PT DURATION: 6 weeks  PLANNED INTERVENTIONS: Therapeutic exercises, Therapeutic activity, Neuromuscular re-education, Balance training, Gait training, Patient/Family education, Self Care, Joint mobilization, Aquatic Therapy, Dry Needling, Electrical stimulation, Cryotherapy, Moist heat, Taping, Ultrasound, Ionotophoresis 4mg /ml Dexamethasone, Manual therapy, and Re-evaluation.  PLAN FOR NEXT SESSION: on hold pending MD visit/MRI   Nedra Hai PT DPT PN2     PHYSICAL THERAPY DISCHARGE SUMMARY  Visits from Start of Care: 4  Current functional level related to goals / functional outcomes: See note   Remaining deficits: See note   Education / Equipment: HEP  Patient goals were not met. Patient is being discharged due to did not respond to therapy.   Chyrel Masson, PT, DPT, OCS, ATC 04/22/23  3:44 PM

## 2023-03-03 NOTE — Telephone Encounter (Signed)
I spoke with patient. MRI entered. Patient aware she will need to follow up post MRI for review.

## 2023-03-03 NOTE — Addendum Note (Signed)
Addended by: Rogers Seeds on: 03/03/2023 04:56 PM   Modules accepted: Orders

## 2023-03-05 ENCOUNTER — Encounter: Payer: 59 | Admitting: Physical Therapy

## 2023-03-09 ENCOUNTER — Ambulatory Visit (INDEPENDENT_AMBULATORY_CARE_PROVIDER_SITE_OTHER): Payer: 59 | Admitting: Mental Health

## 2023-03-09 DIAGNOSIS — F3181 Bipolar II disorder: Secondary | ICD-10-CM

## 2023-03-09 NOTE — Progress Notes (Unsigned)
   THERAPIST PROGRESS NOTE  Session Time: 4:04 pm ( 35 minutes)   Participation Level: Active  Behavioral Response: CasualAlertEuthymic  Type of Therapy: Individual Therapy  Treatment Goals addressed:  STG: [place] will increase management of moods and stressors AEB development of x 3 effective coping skills with ability to process stressors in effective manner per self report within the next 90 days   ProgressTowards Goals: Progressing  Interventions: CBT and Supportive  Summary: KESS FETTY is a 47 y.o. female who presents with dx of bipolar disorder and anxiety. Presents for session alert and oriented; mood and affect euthymic. Speech clear and coherent at normal rate and tone. Engaged and receptive to interventions. Denies excessive highs or lows. Reports ongoing concern related to medical issues, notable back. Shares to have engaged in physical therapy however this has not been beneficial for her. Reports use of CPAP and has noticed with increase quality of sleep, increased mood during days and decreased feelings of lethargy. Notes concern with machine in the middle of the night in which she may awaken to use restroom and can forget to cut machine on in which she wakes up struggling for air. Agrees to follow up with provider about ability to leave machine one while using the restroom at night. 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 Shares balance in daily life with plans to spend time with mother for Mother's day and spending time with her best friend. Denies excessive highs or lows with MH stable at this time. Shares coping with cognitive coping and engagement with natural supports and talking to them. Reports to feel as if she is managing feelings of stress well at this this time.  Denies further concerns. Denies SI/HI. Progress with goals with sxs stable.   Suicidal/Homicidal: Nowithout intent/plan  Therapist Response:  Therapist engaged Sharni in therapy session. Completed check in and assessed for current level of functioning, sxs management and level of stressors.  Explored ability to engage coping skills and ability to navigate stressors adequately. Provided support and encouragement; validated feelings. Supported in [place] in exploring solutions for forgetting to cut CPAP machine back on at night and encouraged to ask questions of provider. Encouraged remaining present focused with use of balanced thoughts and engagement with natural supports. Reviewed session provided follow up; no safety concerns reported.   Plan: Return again in  x 4 weeks.  Diagnosis: Bipolar 2 disorder, major depressive episode (HCC)  Collaboration of Care: Other None  Patient/Guardian was advised Release of Information must be obtained prior to any record release in order to collaborate their care with an outside provider. Patient/Guardian was advised if they have not already done so to contact the registration department to sign all necessary forms in order for [place] to release information regarding their care.   Consent: Patient/Guardian gives verbal consent for treatment and assignment of benefits for services provided during this visit. Patient/Guardian expressed understanding and agreed to proceed.   Stephan Minister [place], [facility] 03/09/2023

## 2023-03-12 ENCOUNTER — Encounter: Payer: 59 | Admitting: Rehabilitative and Restorative Service Providers"

## 2023-03-16 ENCOUNTER — Ambulatory Visit: Payer: 59 | Admitting: Orthopaedic Surgery

## 2023-03-20 ENCOUNTER — Ambulatory Visit
Admission: RE | Admit: 2023-03-20 | Discharge: 2023-03-20 | Disposition: A | Discharge status: Home / Self Care | Payer: 59 | Source: Ambulatory Visit | Attending: Orthopaedic Surgery | Admitting: Orthopaedic Surgery

## 2023-03-20 DIAGNOSIS — M9963 Osseous and subluxation stenosis of intervertebral foramina of lumbar region: Secondary | ICD-10-CM | POA: Diagnosis not present

## 2023-03-20 DIAGNOSIS — M545 Low back pain, unspecified: Secondary | ICD-10-CM | POA: Diagnosis not present

## 2023-03-20 DIAGNOSIS — M5126 Other intervertebral disc displacement, lumbar region: Secondary | ICD-10-CM | POA: Diagnosis not present

## 2023-03-23 ENCOUNTER — Ambulatory Visit (INDEPENDENT_AMBULATORY_CARE_PROVIDER_SITE_OTHER): Payer: PRIVATE HEALTH INSURANCE | Admitting: Orthopaedic Surgery

## 2023-03-23 ENCOUNTER — Encounter: Payer: Self-pay | Admitting: Orthopaedic Surgery

## 2023-03-23 VITALS — Ht 65.0 in | Wt 185.0 lb

## 2023-03-23 DIAGNOSIS — M545 Low back pain, unspecified: Secondary | ICD-10-CM | POA: Diagnosis not present

## 2023-03-23 DIAGNOSIS — M5126 Other intervertebral disc displacement, lumbar region: Secondary | ICD-10-CM

## 2023-03-23 NOTE — Progress Notes (Unsigned)
Office Visit Note   Patient: Theresa Cantrell           Date of Birth: 04/26/1976           MRN: [id number] Visit Date: 03/23/2023              Requested by: Marcine Matar, MD [address] [place],  [place] [address] PCP: Marcine Matar, MD   Assessment & Plan: Visit Diagnoses: No diagnosis found.  Plan: ***  Follow-Up Instructions: Return in about 1 month (around 04/23/2023).   Orders:  No orders of the defined types were placed in this encounter.  No orders of the defined types were placed in this encounter.     Procedures: No procedures performed   Clinical Data: No additional findings.   Subjective: Chief Complaint  Patient presents with   Lower Back - Pain, Follow-up    MRI lumbar review    HPI  Review of Systems   Objective: Vital Signs: Ht 5\' 5"  (1.651 m)   Wt 185 lb (83.9 kg)   BMI 30.79 kg/m   Physical Exam  Ortho Exam  Specialty Comments:  No specialty comments available.  Imaging: No results found.   PMFS History: Patient Active Problem List   Diagnosis Date Noted   Depression with anxiety 11/30/2022   Partial thickness rotator cuff tear 11/16/2022   Adjustment disorder with mixed anxiety and depressed mood 02/11/2022   Rotator cuff tendinitis, right 11/14/2021   S/P cervical spinal fusion 06/17/2021   Spinal stenosis of cervical region 04/02/2021   COVID-19 vaccine series not completed 12/20/2020   Influenza vaccine refused 11/07/2020   Essential hypertension 11/07/2020   Obesity (BMI 30.0-34.9) 11/07/2020   Tobacco dependence 11/07/2020   Gastroesophageal reflux disease without esophagitis 11/07/2020   History of abnormal cervical Pap smear 11/07/2020   History of bipolar disorder 11/07/2020   Bipolar 2 disorder, major depressive episode (HCC) 06/12/2020   PTSD (post-traumatic stress disorder) 06/12/2020   Chronic pain of left knee 10/05/2019   VAIN I (vaginal intraepithelial neoplasia grade I)  05/17/2018   Neuropathy 02/07/2014   Hot flashes 02/07/2014   Weight loss 02/07/2014   Anxiety    Past Medical History:  Diagnosis Date   Anxiety    Arthritis    Bipolar 1 disorder (HCC)    Depression    GERD (gastroesophageal reflux disease)    Hypertension    PTSD (post-traumatic stress disorder)     Family History  Problem Relation Age of Onset   Hypertension Mother    Cancer Mother        breast   Anxiety disorder Mother    Hypertension Father    Schizophrenia Father    Bipolar disorder Father    Anxiety disorder Father    Colon cancer Neg Hx    Colon polyps Neg Hx    Esophageal cancer Neg Hx    Rectal cancer Neg Hx    Stomach cancer Neg Hx     Past Surgical History:  Procedure Laterality Date   ABDOMINAL HYSTERECTOMY     ANTERIOR CERVICAL DECOMP/DISCECTOMY FUSION N/A 06/06/2021   Procedure: C4-5, C5-6 ANTERIOR CERVICAL DISCECTOMY FUSION, ALLOGRAFT, PLATE;  Surgeon: Eldred Manges, MD;  Location: [facility] OR;  Service: Orthopedics;  Laterality: N/A;   CHOLECYSTECTOMY     DILATION AND CURETTAGE OF UTERUS     FOOT SURGERY Bilateral    two pins in each foot   JOINT REPLACEMENT Left 2018  partial knee replacement   KNEE SURGERY Left 2018   partial knee replacement   NECK SURGERY  06/06/2021   C4-5, C5-6 ANTERIOR CERVICAL DISCECTOMY FUSION, ALLOGRAFT, PLATE   Social History   Occupational History   Not on file  Tobacco Use   Smoking status: Former    Packs/day: .25    Types: Cigarettes   Smokeless tobacco: Never  Vaping Use   Vaping Use: Never used  Substance and Sexual Activity   Alcohol use: Yes    Alcohol/week: 2.0 standard drinks of alcohol    Types: 1 Glasses of wine, 1 Cans of beer per week   Drug use: No   Sexual activity: Not Currently    Birth control/protection: Surgical

## 2023-03-24 DIAGNOSIS — M5126 Other intervertebral disc displacement, lumbar region: Secondary | ICD-10-CM | POA: Insufficient documentation

## 2023-03-28 ENCOUNTER — Emergency Department (HOSPITAL_COMMUNITY)
Admission: EM | Admit: 2023-03-28 | Discharge: 2023-03-28 | Disposition: A | Discharge status: Home / Self Care | Payer: 59 | Attending: Emergency Medicine | Admitting: Emergency Medicine

## 2023-03-28 ENCOUNTER — Encounter (HOSPITAL_COMMUNITY): Payer: Self-pay

## 2023-03-28 ENCOUNTER — Emergency Department (HOSPITAL_COMMUNITY): Payer: 59

## 2023-03-28 DIAGNOSIS — L03213 Periorbital cellulitis: Secondary | ICD-10-CM | POA: Insufficient documentation

## 2023-03-28 DIAGNOSIS — H5789 Other specified disorders of eye and adnexa: Secondary | ICD-10-CM | POA: Diagnosis not present

## 2023-03-28 LAB — CBC WITH DIFFERENTIAL/PLATELET
Abs Immature Granulocytes: 0.02 10*3/uL (ref 0.00–0.07)
Basophils Absolute: 0 10*3/uL (ref 0.0–0.1)
Basophils Relative: 0 %
Eosinophils Absolute: 0.1 10*3/uL (ref 0.0–0.5)
Eosinophils Relative: 2 %
HCT: 42.5 % (ref 36.0–46.0)
Hemoglobin: 13.9 g/dL (ref 12.0–15.0)
Immature Granulocytes: 0 %
Lymphocytes Relative: 28 %
Lymphs Abs: 1.5 10*3/uL (ref 0.7–4.0)
MCH: 30.9 pg (ref 26.0–34.0)
MCHC: 32.7 g/dL (ref 30.0–36.0)
MCV: 94.4 fL (ref 80.0–100.0)
Monocytes Absolute: 0.4 10*3/uL (ref 0.1–1.0)
Monocytes Relative: 7 %
Neutro Abs: 3.3 10*3/uL (ref 1.7–7.7)
Neutrophils Relative %: 63 %
Platelets: 234 10*3/uL (ref 150–400)
RBC: 4.5 MIL/uL (ref 3.87–5.11)
RDW: 12.8 % (ref 11.5–15.5)
WBC: 5.4 10*3/uL (ref 4.0–10.5)
nRBC: 0 % (ref 0.0–0.2)

## 2023-03-28 LAB — COMPREHENSIVE METABOLIC PANEL
ALT: 16 U/L (ref 0–44)
AST: 21 U/L (ref 15–41)
Albumin: 4 g/dL (ref 3.5–5.0)
Alkaline Phosphatase: 88 U/L (ref 38–126)
Anion gap: 11 (ref 5–15)
BUN: 13 mg/dL (ref 6–20)
CO2: 27 mmol/L (ref 22–32)
Calcium: 9.6 mg/dL (ref 8.9–10.3)
Chloride: 102 mmol/L (ref 98–111)
Creatinine, Ser: 0.88 mg/dL (ref 0.44–1.00)
GFR, Estimated: >60 mL/min (ref >60)
Glucose, Bld: 95 mg/dL (ref 70–99)
Potassium: 4.9 mmol/L (ref 3.5–5.1)
Sodium: 140 mmol/L (ref 135–145)
Total Bilirubin: 0.8 mg/dL (ref 0.3–1.2)
Total Protein: 6.8 g/dL (ref 6.5–8.1)

## 2023-03-28 MED ORDER — AMOXICILLIN-POT CLAVULANATE 875-125 MG PO TABS: 1.0000 | ORAL_TABLET | Freq: Two times a day (BID) | ORAL | 0 refills | Status: AC

## 2023-03-28 MED ORDER — TETRACAINE HCL 0.5 % OP SOLN
2.0000 [drp] | Freq: Once | OPHTHALMIC | Status: AC
  Administered 2023-03-28: 2 [drp] via OPHTHALMIC
  Filled 2023-03-28: qty 4

## 2023-03-28 MED ORDER — IOHEXOL 350 MG/ML SOLN
75.0000 mL | Freq: Once | INTRAVENOUS | Status: AC | PRN
  Administered 2023-03-28: 75 mL via INTRAVENOUS

## 2023-03-28 MED ORDER — FLUORESCEIN SODIUM 1 MG OP STRP
1.0000 | ORAL_STRIP | Freq: Once | OPHTHALMIC | Status: AC
  Administered 2023-03-28: 1 via OPHTHALMIC
  Filled 2023-03-28: qty 1

## 2023-03-28 MED ORDER — POLYMYXIN B-TRIMETHOPRIM 10000-0.1 UNIT/ML-% OP SOLN: 2.0000 [drp] | OPHTHALMIC | 0 refills | Status: DC

## 2023-03-28 NOTE — ED Notes (Signed)
Transported to CT 

## 2023-03-28 NOTE — ED Triage Notes (Addendum)
Pt was given a new brand of contact by her eye dr. Rock Nephew took it out on Friday. Since then, pt had left  eyelid swelling, but has now started to look like bruising around eye. Pt has been wearing glasses since Friday. Pt states she thinks her right eye is about to start swelling too.

## 2023-03-28 NOTE — ED Provider Notes (Signed)
[place] EMERGENCY DEPARTMENT AT [facility] Provider Note   CSN: [id number] Arrival date & time: 03/28/23  1054     History Chief Complaint  Patient presents with   Eye Problem    Theresa Cantrell is a 47 y.o. female.  Patient presents emerged part complaints eye problem.  She reports that she recently had new contacts given to her by her ophthalmologist and began experience some swelling and irritation in the left eye primarily.  She denies any obvious severe vision changes but difficult to assess due to no longer using contact lenses.  Previously wore contact lenses but these are new contacts.  Denies any significant drainage from the eyes.  Denies limitations in eye movement, double vision, vision loss, pain with eye movement, chemosis.  She is concerned that the right eye is starting to feel little more irritated and will likely feel the same way soon.  States that the eyes are significantly itchy and has been rubbing at them incessantly.   Eye Problem Associated symptoms: itching        Home Medications Prior to Admission medications   Medication Sig Start Date End Date Taking? Authorizing Provider  amoxicillin-clavulanate (AUGMENTIN) 875-125 MG tablet Take 1 tablet by mouth every 12 (twelve) hours for 7 days. 03/28/23 04/04/23 Yes Smitty Knudsen, PA-C  trimethoprim-polymyxin b (POLYTRIM) ophthalmic solution Place 2 drops into both eyes every 4 (four) hours. 03/28/23  Yes Smitty Knudsen, PA-C  albuterol (VENTOLIN HFA) 108 (90 Base) MCG/ACT inhaler Inhale 2 puffs into the lungs every 6 (six) hours as needed for wheezing or shortness of breath. 10/05/22   Waldon Merl, PA-C  ARIPiprazole (ABILIFY) 5 MG tablet Take 1 tablet (5 mg total) by mouth daily. 12/01/22   Nwoko, Stephens Shire E, PA  celecoxib (CELEBREX) 200 MG capsule TAKE 1 CAPSULE BY MOUTH TWICE DAILY AS NEEDED BETWEEN  MEALS. 02/15/23   Eldred Manges, MD  Cholecalciferol (VITAMIN D) 50 MCG (2000 UT) tablet Take 2,000  Units by mouth daily.    [provider]  cyclobenzaprine (FLEXERIL) 5 MG tablet TAKE 1 TABLET BY MOUTH ONCE DAILY AS NEEDED FOR MUSCLE SPASM 02/15/23   Marcine Matar, MD  FLUoxetine (PROZAC) 10 MG capsule Take 1 capsule (10 mg total) by mouth daily. 12/01/22   Meta Hatchet, PA  furosemide (LASIX) 20 MG tablet 1 tab PO Q Mon/Wed/Friday 08/24/22   Marcine Matar, MD  hydrOXYzine (ATARAX) 10 MG tablet Take 1 tablet (10 mg total) by mouth 3 (three) times daily as needed. 12/01/22   Nwoko, Tommas Olp, PA  prazosin (MINIPRESS) 1 MG capsule Take 1 capsule (1 mg total) by mouth at bedtime. 12/01/22   Nwoko, Tommas Olp, PA  traZODone (DESYREL) 50 MG tablet Take 1 tablet (50 mg total) by mouth at bedtime as needed for sleep. 12/01/22   Nwoko, Tommas Olp, PA  valsartan (DIOVAN) 40 MG tablet Take 1 tablet (40 mg total) by mouth daily. 01/20/23   Marcine Matar, MD      Allergies    Amlodipine, Hydrochlorothiazide, and Lamotrigine    Review of Systems   Review of Systems  Eyes:  Positive for itching.  All other systems reviewed and are negative.   Physical Exam Updated Vital Signs BP (!) 156/91 (BP Location: Right Arm)   Pulse 74   Temp 98.2 F (36.8 C) (Oral)   Resp 16   Ht 5\' 5"  (1.651 m)   Wt 83.9 kg  SpO2 100%   BMI 30.79 kg/m  Physical Exam Vitals and nursing note reviewed.  Constitutional:      General: She is not in acute distress.    Appearance: She is well-developed.  HENT:     Head: Normocephalic and atraumatic.     Comments:  Visual Acuity Testing: OD: 20/32 OS: 20/40 Bilateral: 20/50 Eyes:     General: No scleral icterus.       Right eye: No discharge.        Left eye: No discharge.     Extraocular Movements: Extraocular movements intact.     Conjunctiva/sclera: Conjunctivae normal.     Pupils: Pupils are equal, round, and reactive to light.     Comments: Mild swelling to the eyelids of the left eye.  Right eye appears normal.  Ocular Pressure  Readings: OD: 21 OS: 23   Cardiovascular:     Rate and Rhythm: Normal rate and regular rhythm.     Heart sounds: No murmur heard. Pulmonary:     Effort: Pulmonary effort is normal. No respiratory distress.     Breath sounds: Normal breath sounds.  Abdominal:     Palpations: Abdomen is soft.     Tenderness: There is no abdominal tenderness.  Musculoskeletal:        General: No swelling.     Cervical back: Neck supple.  Skin:    General: Skin is warm and dry.     Capillary Refill: Capillary refill takes less than 2 seconds.  Neurological:     Mental Status: She is alert.  Psychiatric:        Mood and Affect: Mood normal.     ED Results / Procedures / Treatments   Labs (all labs ordered are listed, but only abnormal results are displayed) Labs Reviewed  CBC WITH DIFFERENTIAL/PLATELET  COMPREHENSIVE METABOLIC PANEL  I-STAT CHEM 8, ED    EKG None  Radiology CT Orbits W Contrast  Result Date: 03/28/2023 CLINICAL DATA:  Periorbital cellulitis. EXAM: CT ORBITS WITH CONTRAST TECHNIQUE: Multidetector CT images was performed according to the standard protocol following intravenous contrast administration. RADIATION DOSE REDUCTION: This exam was performed according to the departmental dose-optimization program which includes automated exposure control, adjustment of the mA and/or kV according to patient size and/or use of iterative reconstruction technique. CONTRAST:  75mL OMNIPAQUE IOHEXOL 350 MG/ML SOLN COMPARISON:  None Available. FINDINGS: Orbits: Mild left periorbital soft tissue swelling most notably involving the lower eyelid. No postseptal inflammation or fluid collection. Intact globes. Visible paranasal sinuses: Paranasal sinuses and mastoid air cells are clear. Soft tissues: Otherwise negative. Osseous: No fracture or destructive osseous process. Partially visualized ACDF. Limited intracranial: Enlarged partially empty sella. IMPRESSION: Mild left periorbital soft tissue  swelling. No postseptal inflammation or fluid collection. Electronically Signed   By: Sebastian Ache M.D.   On: 03/28/2023 15:47    Procedures Procedures   Medications Ordered in ED Medications  fluorescein ophthalmic strip 1 strip (1 strip Both Eyes Given 03/28/23 1237)  tetracaine (PONTOCAINE) 0.5 % ophthalmic solution 2 drop (2 drops Both Eyes Given 03/28/23 1237)  iohexol (OMNIPAQUE) 350 MG/ML injection 75 mL (75 mLs Intravenous Contrast Given 03/28/23 1423)    ED Course/ Medical Decision Making/ A&P                           Medical Decision Making  This patient presents to the ED for concern of eye problem.  Differential diagnosis includes bacterial  conjunctivitis, periorbital cellulitis, orbital cellulitis, cellulitis, sinusitis    Lab Tests:  I Ordered, and personally interpreted labs.  The pertinent results include: CBC and CMP unremarkable   Imaging Studies ordered:  I ordered imaging studies including CT orbits I independently visualized and interpreted imaging which showed periorbital swelling consistent.  All cellulitis I agree with the radiologist interpretation   Problem List / ED Course:  Patient presents emergency department complaints of eye problems.  Reports that she was prescribed a new contact lenses prior ophthalmologist and was using these over the last week but began to experience notable itching to the eyes a few days ago.  Minimal pain but there is some bruising noted around the eye likely due to significant scratching of the area.  Denies any fevers, vision changes, eye pain with movement.  Given concern for possible orbital cellulitis, lab workup initiated for CT scan.  Labs were reassuring.  CT orbits ordered. CT orbits scan showing signs of periorbital cellulitis no evidence of orbital cellulitis at this time.  Informed patient of findings and provided reassurance that symptoms can be managed with oral antibiotics and outpatient treatment.  Also sent  prescription of ophthalmic antibiotics.  Advised patient that she should follow-up with ophthalmologist in the next 3 to 5 days for further evaluation to ensure that her symptoms are improving.  Cautioned patient return to the emergency department if symptoms are acutely worsening.  Patient is agreeable to treatment plan verbalized understanding return precautions.  Final Clinical Impression(s) / ED Diagnoses Final diagnoses:  Periorbital cellulitis of left eye    Rx / DC Orders ED Discharge Orders          Ordered    amoxicillin-clavulanate (AUGMENTIN) 875-125 MG tablet  Every 12 hours        03/28/23 1612    trimethoprim-polymyxin b (POLYTRIM) ophthalmic solution  Every 4 hours        03/28/23 1612              Smitty Knudsen, PA-C 03/28/23 1617    Glynn Octave, MD 03/28/23 2216

## 2023-03-28 NOTE — Discharge Instructions (Signed)
You are seen in the emergency department for an eye problem.  On our evaluation, you are diagnosed with periorbital cellulitis.  This may be due to the use of contact lenses so advised not using contact lenses while you are being treated with this antibiotic therapy.  Two antibiotics have been sent to your pharmacy which include Augmentin and Polytrim eyedrops.  Please use these antibiotics as prescribed.  Plan to follow-up with your ophthalmologist within the next 7 days for further evaluation to ensure that your symptoms are improving.  If you are concerned of any acute worsening of your symptoms such as significant vision loss, significant eye pain, or hard bulging eyes, please return to the emergency department for further evaluation.

## 2023-04-02 DIAGNOSIS — H00015 Hordeolum externum left lower eyelid: Secondary | ICD-10-CM | POA: Diagnosis not present

## 2023-04-06 ENCOUNTER — Ambulatory Visit: Payer: PRIVATE HEALTH INSURANCE | Admitting: Internal Medicine

## 2023-04-12 ENCOUNTER — Ambulatory Visit (INDEPENDENT_AMBULATORY_CARE_PROVIDER_SITE_OTHER): Payer: 59 | Admitting: Physical Medicine and Rehabilitation

## 2023-04-12 ENCOUNTER — Other Ambulatory Visit: Payer: Self-pay

## 2023-04-12 VITALS — BP 130/85 | HR 92

## 2023-04-12 DIAGNOSIS — M5416 Radiculopathy, lumbar region: Secondary | ICD-10-CM | POA: Diagnosis not present

## 2023-04-12 MED ORDER — METHYLPREDNISOLONE ACETATE 80 MG/ML IJ SUSP
80.0000 mg | Freq: Once | INTRAMUSCULAR | Status: AC
Start: 2023-04-12 — End: 2023-04-12
  Administered 2023-04-12: 80 mg

## 2023-04-12 NOTE — Progress Notes (Signed)
Functional Pain Scale - descriptive words and definitions  Distressing (6)    Pain is present/unable to complete most ADLs limited by pain/sleep is difficult and active distraction is only marginal. Moderate range order  Average Pain 8   +Driver, -BT, -Dye Allergies.  Lower back pain all the way across, worse on left side with radiation in left leg to the ankle. Pain is worse when sitting and lying down

## 2023-04-12 NOTE — Patient Instructions (Signed)

## 2023-04-14 ENCOUNTER — Other Ambulatory Visit: Payer: Self-pay | Admitting: Internal Medicine

## 2023-04-15 ENCOUNTER — Ambulatory Visit (INDEPENDENT_AMBULATORY_CARE_PROVIDER_SITE_OTHER): Payer: 59 | Admitting: Mental Health

## 2023-04-15 DIAGNOSIS — F3181 Bipolar II disorder: Secondary | ICD-10-CM

## 2023-04-15 NOTE — Progress Notes (Signed)
   THERAPIST PROGRESS NOTE  Session Time: 4:03 pm ( 40 minutes)  Participation Level: Active  Behavioral Response: CasualAlertWNL  Type of Therapy: Individual Therapy  Treatment Goals addressed:   STG: Wilma will increase management of moods and stressors AEB development of x 3 effective coping skills with ability to process stressors in effective manner per self report within the next 90 days   ProgressTowards Goals: Progressing  Interventions: Supportive  Summary: Theresa Cantrell is a 47 y.o. female who presents with dx of bipolar disorder and anxiety. Presents for session alert and oriented; mood and affect adequate. Speech clear and coherent at normal rate and tone. Engaged and receptive to interventions. Shares for moods to have been stable and to have increased time with family and friends, notes for daughter to visit several times weekly. Shares medical concerns and shares events of presenting to ED for infection in her eye as well as ongoing pain in her back. Shares periods of having to call out from work due to pain in back. Attempted to explore other positions at work that may not be as taxing on her back but denies for this to have been supportive and more sedentary work increased back pain. Notes working to not stress about things and enjoying outings. Notes for moods to be stable with coping skills in place of engaging with natural supports and cognitive coping. Denies SI/HI. Progress with goals, MH sxs stable.   Suicidal/Homicidal: Nowithout intent/plan  Therapist Response: Therapist engaged [place] in therapy session. Completed check in and assessed for current level of functioning, sxs management and level of stressors.  Explored ability to engage coping skills and ability to navigate stressors adequately. Provided support and encouragement; validated feelings. Supported in identifying factors that support in maintain mental health stability. Encouraged to continue to follow up  with providers for management of pain. Reviewed GAD and PHQ scores 9 and 3 respectively. Reviewed session and provided follow up.   Plan: Return again in x 4  weeks.  Diagnosis: Bipolar 2 disorder, major depressive episode (HCC)  Collaboration of Care: Other None  Patient/Guardian was advised Release of Information must be obtained prior to any record release in order to collaborate their care with an outside provider. Patient/Guardian was advised if they have not already done so to contact the registration department to sign all necessary forms in order for [place] to release information regarding their care.   Consent: Patient/Guardian gives verbal consent for treatment and assignment of benefits for services provided during this visit. Patient/Guardian expressed understanding and agreed to proceed.   Stephan Minister [place], [facility] 04/15/2023

## 2023-04-23 ENCOUNTER — Telehealth: Payer: Self-pay | Admitting: Orthopaedic Surgery

## 2023-04-23 ENCOUNTER — Ambulatory Visit (INDEPENDENT_AMBULATORY_CARE_PROVIDER_SITE_OTHER): Payer: PRIVATE HEALTH INSURANCE | Admitting: Orthopaedic Surgery

## 2023-04-23 ENCOUNTER — Encounter: Payer: Self-pay | Admitting: Orthopaedic Surgery

## 2023-04-23 VITALS — BP 139/85 | HR 85 | Ht 65.0 in | Wt 180.0 lb

## 2023-04-23 DIAGNOSIS — M5126 Other intervertebral disc displacement, lumbar region: Secondary | ICD-10-CM | POA: Diagnosis not present

## 2023-04-23 NOTE — Telephone Encounter (Signed)
Theresa Cantrell from [employer] called. That is the employer of the patient. They need a more detailed work note. Like how many days and how much lifting she can do? Her cb# is [phone] her email is [email or web address]

## 2023-04-23 NOTE — Progress Notes (Signed)
Office Visit Note   Patient: Theresa Cantrell           Date of Birth: 1976/05/16           MRN: [id number] Visit Date: 04/23/2023              Requested by: Marcine Matar, MD [address] [place] [address] [place],  [place] [address] PCP: Marcine Matar, MD   Assessment & Plan: Visit Diagnoses:  1. Protrusion of lumbar intervertebral disc     Plan: Patient has small disc bulge no surgery is indicated.  She has had the epidural and hopefully this may kick in and she may get some relief in a week or so.  I reviewed the MRI scan with her again today and at this point no surgery is recommended.  Work note states patient has some lumbar disc degeneration no surgery indicated.  She will have problems with lifting for several months.  I will plan to recheck her in 6 months.  Follow-Up Instructions: Return in about 6 months (around 10/23/2023).   Orders:  No orders of the defined types were placed in this encounter.  No orders of the defined types were placed in this encounter.     Procedures: No procedures performed   Clinical Data: No additional findings.   Subjective: Chief Complaint  Patient presents with   Lower Back - Pain    HPI 47 year old female returns post epidural 04/12/2023 and states she does not think the injection has helped.  She states she has back pain pain down both legs in the buttocks but down the left leg more.  She states she can hardly make it to work she has problems walking some problems with bending getting in and out of bed.  She is ambulating with a cane.  Past history of bipolar disorder, PTSD, depression, two-level cervical fusion C4-5 C5-6 which is solidly healed.  Lumbar MRI showed shallow disc protrusion L5-S1 which could potentially irritate S1 nerve roots.  She had mild foraminal narrowing.  Patient's used ibuprofen has not gotten relief.  He is requesting a work note that states that she may have some problems with lifting.  Review of  Systems all other systems updated unchanged from previous visit.  No chills or fever no myelopathic symptoms no bowel bladder associated symptoms.   Objective: Vital Signs: BP 139/85   Pulse 85   Ht 5\' 5"  (1.651 m)   Wt 180 lb (81.6 kg)   BMI 29.95 kg/m   Physical Exam Constitutional:      Appearance: She is well-developed.  HENT:     Head: Normocephalic.     Right Ear: External ear normal.     Left Ear: External ear normal. There is no impacted cerumen.  Eyes:     Pupils: Pupils are equal, round, and reactive to light.  Neck:     Thyroid: No thyromegaly.     Trachea: No tracheal deviation.  Cardiovascular:     Rate and Rhythm: Normal rate.  Pulmonary:     Effort: Pulmonary effort is normal.  Abdominal:     Palpations: Abdomen is soft.  Musculoskeletal:     Cervical back: No rigidity.  Skin:    General: Skin is warm and dry.  Neurological:     Mental Status: She is alert and oriented to person, place, and time.  Psychiatric:        Behavior: Behavior normal.     Ortho Exam negative straight  leg raising right and left 90 degrees.  Reflexes are intact and symmetrical.  She is able to heel and toe walk walks slowly.  She has been ambulating has a cane with her.  Negative hip range of motion knees reach full extension no upper extremity atrophy no rash over exposed skin.  Distal pulses are 2+ and symmetrical.  Specialty Comments:  MRI LUMBAR SPINE WITHOUT CONTRAST   TECHNIQUE: Multiplanar, multisequence MR imaging of the lumbar spine was performed. No intravenous contrast was administered.   COMPARISON:  Lumbar spine radiographs 01/12/2023   FINDINGS: Segmentation: There are five lumbar type vertebral bodies. The last full intervertebral disc space is labeled L5-S1. This correlates with the radiographs.   Alignment: Left convex scoliotic curvature but normal alignment in the sagittal plane.   Vertebrae: No acute bony findings or bone lesions. Moderate  endplate reactive changes at L5-S1.   Conus medullaris and cauda equina: Conus extends to the L2 level. Conus and cauda equina appear normal.   Paraspinal and other soft tissues: No significant paraspinal or retroperitoneal findings.   Disc levels:   T12-L1: No significant findings.   L1-2: No significant findings.   L2-3: Mild annular bulge and mild facet disease with mild bilateral lateral recess encroachment but no significant spinal or foraminal stenosis.   L3-4: Bulging annulus with flattening of the ventral thecal sac in combination with mild facet disease and ligamentum flavum thickening contributing to mild bilateral lateral recess stenosis. No significant spinal or foraminal stenosis.   L4-5: Bulging annulus, moderate facet disease and ligamentum flavum thickening with mild bilateral lateral recess encroachment but no significant spinal or foraminal stenosis.   L5-S1: Shallow broad-based disc protrusion with slight mass effect on the ventral thecal sac and potential irritation of the S1 nerve roots. There is also mild left foraminal narrowing.   IMPRESSION: 1. Mild bilateral lateral recess encroachment at L2-3, L3-4 and L4-5. 2. Shallow broad-based disc protrusion at L5-S1 with slight mass effect on the ventral thecal sac and potential irritation of the S1 nerve roots. There is also mild left foraminal narrowing.     Electronically Signed   By: Rudie Meyer M.D.   On: 03/23/2023 13:25  Imaging: No results found.   PMFS History: Patient Active Problem List   Diagnosis Date Noted   Protrusion of lumbar intervertebral disc 03/24/2023   Depression with anxiety 11/30/2022   Partial thickness rotator cuff tear 11/16/2022   Adjustment disorder with mixed anxiety and depressed mood 02/11/2022   Rotator cuff tendinitis, right 11/14/2021   S/P cervical spinal fusion 06/17/2021   Spinal stenosis of cervical region 04/02/2021   COVID-19 vaccine series not  completed 12/20/2020   Influenza vaccine refused 11/07/2020   Essential hypertension 11/07/2020   Obesity (BMI 30.0-34.9) 11/07/2020   Tobacco dependence 11/07/2020   Gastroesophageal reflux disease without esophagitis 11/07/2020   History of abnormal cervical Pap smear 11/07/2020   History of bipolar disorder 11/07/2020   Bipolar 2 disorder, major depressive episode (HCC) 06/12/2020   PTSD (post-traumatic stress disorder) 06/12/2020   Chronic pain of left knee 10/05/2019   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   Neuropathy 02/07/2014   Hot flashes 02/07/2014   Weight loss 02/07/2014   Anxiety    Past Medical History:  Diagnosis Date   Anxiety    Arthritis    Bipolar 1 disorder (HCC)    Depression    GERD (gastroesophageal reflux disease)    Hypertension    PTSD (post-traumatic stress  disorder)     Family History  Problem Relation Age of Onset   Hypertension Mother    Cancer Mother        breast   Anxiety disorder Mother    Hypertension Father    Schizophrenia Father    Bipolar disorder Father    Anxiety disorder Father    Colon cancer Neg Hx    Colon polyps Neg Hx    Esophageal cancer Neg Hx    Rectal cancer Neg Hx    Stomach cancer Neg Hx     Past Surgical History:  Procedure Laterality Date   ABDOMINAL HYSTERECTOMY     ANTERIOR CERVICAL DECOMP/DISCECTOMY FUSION N/A 06/06/2021   Procedure: C4-5, C5-6 ANTERIOR CERVICAL DISCECTOMY FUSION, ALLOGRAFT, PLATE;  Surgeon: Eldred Manges, MD;  Location: [facility] OR;  Service: Orthopedics;  Laterality: N/A;   CHOLECYSTECTOMY     DILATION AND CURETTAGE OF UTERUS     FOOT SURGERY Bilateral    two pins in each foot   JOINT REPLACEMENT Left 2018   partial knee replacement   KNEE SURGERY Left 2018   partial knee replacement   NECK SURGERY  06/06/2021   C4-5, C5-6 ANTERIOR CERVICAL DISCECTOMY FUSION, ALLOGRAFT, PLATE   Social History   Occupational History   Not on file  Tobacco Use   Smoking status:  Former    Packs/day: .25    Types: Cigarettes   Smokeless tobacco: Never  Vaping Use   Vaping Use: Never used  Substance and Sexual Activity   Alcohol use: Yes    Alcohol/week: 2.0 standard drinks of alcohol    Types: 1 Glasses of wine, 1 Cans of beer per week   Drug use: No   Sexual activity: Not Currently    Birth control/protection: Surgical

## 2023-04-26 NOTE — Telephone Encounter (Signed)
I spoke with patient. Note entered and emailed.

## 2023-04-26 NOTE — Progress Notes (Signed)
Theresa Cantrell - 47 y.o. female MRN [id number]  Date of birth: August 11, 1976  Office Visit Note: Visit Date: 04/12/2023 PCP: Marcine Matar, MD Referred by: Marcine Matar, MD  Subjective: Chief Complaint  Patient presents with   Lower Back - Pain   HPI:  Theresa Cantrell is a 47 y.o. female who comes in today at the request of Dr. Annell Greening for planned Left L5-S1 Lumbar Interlaminar epidural steroid injection with fluoroscopic guidance.  The patient has failed conservative care including home exercise, medications, time and activity modification.  This injection will be diagnostic and hopefully therapeutic.  Please see requesting physician notes for further details and justification.   ROS Otherwise per HPI.  Assessment & Plan: Visit Diagnoses:    ICD-10-CM   1. Lumbar radiculopathy  M54.16 XR C-ARM NO REPORT    Epidural Steroid injection    methylPREDNISolone acetate (DEPO-MEDROL) injection 80 mg      Plan: No additional findings.   Meds & Orders:  Meds ordered this encounter  Medications   methylPREDNISolone acetate (DEPO-MEDROL) injection 80 mg    Orders Placed This Encounter  Procedures   XR C-ARM NO REPORT   Epidural Steroid injection    Follow-up: Return for visit to requesting provider as needed.   Procedures: No procedures performed  Lumbar Epidural Steroid Injection - Interlaminar Approach with Fluoroscopic Guidance  Patient: Theresa Cantrell      Date of Birth: 24-Oct-1976 MRN: [id number] PCP: Marcine Matar, MD      Visit Date: 04/12/2023   Universal Protocol:     Consent Given By: the patient  Position: PRONE  Additional Comments: Vital signs were monitored before and after the procedure. Patient was prepped and draped in the usual sterile fashion. The correct patient, procedure, and site was verified.   Injection Procedure Details:   Procedure diagnoses: Lumbar radiculopathy [M54.16]   Meds Administered:  Meds ordered this  encounter  Medications   methylPREDNISolone acetate (DEPO-MEDROL) injection 80 mg     Laterality: Left  Location/Site:  L5-S1  Needle: 3.5 in., 20 ga. Tuohy  Needle Placement: Paramedian epidural  Findings:   -Comments: Excellent flow of contrast into the epidural space.  Procedure Details: Using a paramedian approach from the side mentioned above, the region overlying the inferior lamina was localized under fluoroscopic visualization and the soft tissues overlying this structure were infiltrated with 4 ml. of 1% Lidocaine without Epinephrine. The Tuohy needle was inserted into the epidural space using a paramedian approach.   The epidural space was localized using loss of resistance along with counter oblique bi-planar fluoroscopic views.  After negative aspirate for air, blood, and CSF, a 2 ml. volume of Isovue-250 was injected into the epidural space and the flow of contrast was observed. Radiographs were obtained for documentation purposes.    The injectate was administered into the level noted above.   Additional Comments:  No complications occurred Dressing: 2 x 2 sterile gauze and Band-Aid    Post-procedure details: Patient was observed during the procedure. Post-procedure instructions were reviewed.  Patient left the clinic in stable condition.   Clinical History: MRI LUMBAR SPINE WITHOUT CONTRAST   TECHNIQUE: Multiplanar, multisequence MR imaging of the lumbar spine was performed. No intravenous contrast was administered.   COMPARISON:  Lumbar spine radiographs 01/12/2023   FINDINGS: Segmentation: There are five lumbar type vertebral bodies. The last full intervertebral disc space is labeled L5-S1. This correlates with the radiographs.   Alignment: Left convex  scoliotic curvature but normal alignment in the sagittal plane.   Vertebrae: No acute bony findings or bone lesions. Moderate endplate reactive changes at L5-S1.   Conus medullaris and cauda  equina: Conus extends to the L2 level. Conus and cauda equina appear normal.   Paraspinal and other soft tissues: No significant paraspinal or retroperitoneal findings.   Disc levels:   T12-L1: No significant findings.   L1-2: No significant findings.   L2-3: Mild annular bulge and mild facet disease with mild bilateral lateral recess encroachment but no significant spinal or foraminal stenosis.   L3-4: Bulging annulus with flattening of the ventral thecal sac in combination with mild facet disease and ligamentum flavum thickening contributing to mild bilateral lateral recess stenosis. No significant spinal or foraminal stenosis.   L4-5: Bulging annulus, moderate facet disease and ligamentum flavum thickening with mild bilateral lateral recess encroachment but no significant spinal or foraminal stenosis.   L5-S1: Shallow broad-based disc protrusion with slight mass effect on the ventral thecal sac and potential irritation of the S1 nerve roots. There is also mild left foraminal narrowing.   IMPRESSION: 1. Mild bilateral lateral recess encroachment at L2-3, L3-4 and L4-5. 2. Shallow broad-based disc protrusion at L5-S1 with slight mass effect on the ventral thecal sac and potential irritation of the S1 nerve roots. There is also mild left foraminal narrowing.     Electronically Signed   By: Rudie Meyer M.D.   On: 03/23/2023 13:25     Objective:  VS:  HT:    WT:   BMI:     BP:130/85  HR:92bpm  TEMP: ( )  RESP:  Physical Exam Vitals and nursing note reviewed.  Constitutional:      General: She is not in acute distress.    Appearance: Normal appearance. She is not ill-appearing.  HENT:     Head: Normocephalic and atraumatic.     Right Ear: External ear normal.     Left Ear: External ear normal.  Eyes:     Extraocular Movements: Extraocular movements intact.  Cardiovascular:     Rate and Rhythm: Normal rate.     Pulses: Normal pulses.  Pulmonary:      Effort: Pulmonary effort is normal. No respiratory distress.  Abdominal:     General: There is no distension.     Palpations: Abdomen is soft.  Musculoskeletal:        General: Tenderness present.     Cervical back: Neck supple.     Right lower leg: No edema.     Left lower leg: No edema.     Comments: Patient has good distal strength with no pain over the greater trochanters.  No clonus or focal weakness.  Skin:    Findings: No erythema, lesion or rash.  Neurological:     General: No focal deficit present.     Mental Status: She is alert and oriented to person, place, and time.     Sensory: No sensory deficit.     Motor: No weakness or abnormal muscle tone.     Coordination: Coordination normal.  Psychiatric:        Mood and Affect: Mood normal.        Behavior: Behavior normal.      Imaging: No results found.

## 2023-04-26 NOTE — Procedures (Signed)
Lumbar Epidural Steroid Injection - Interlaminar Approach with Fluoroscopic Guidance  Patient: MAURENE HOLLIN      Date of Birth: 15-May-1976 MRN: [id number] PCP: Marcine Matar, MD      Visit Date: 04/12/2023   Universal Protocol:     Consent Given By: the patient  Position: PRONE  Additional Comments: Vital signs were monitored before and after the procedure. Patient was prepped and draped in the usual sterile fashion. The correct patient, procedure, and site was verified.   Injection Procedure Details:   Procedure diagnoses: Lumbar radiculopathy [M54.16]   Meds Administered:  Meds ordered this encounter  Medications   methylPREDNISolone acetate (DEPO-MEDROL) injection 80 mg     Laterality: Left  Location/Site:  L5-S1  Needle: 3.5 in., 20 ga. Tuohy  Needle Placement: Paramedian epidural  Findings:   -Comments: Excellent flow of contrast into the epidural space.  Procedure Details: Using a paramedian approach from the side mentioned above, the region overlying the inferior lamina was localized under fluoroscopic visualization and the soft tissues overlying this structure were infiltrated with 4 ml. of 1% Lidocaine without Epinephrine. The Tuohy needle was inserted into the epidural space using a paramedian approach.   The epidural space was localized using loss of resistance along with counter oblique bi-planar fluoroscopic views.  After negative aspirate for air, blood, and CSF, a 2 ml. volume of Isovue-250 was injected into the epidural space and the flow of contrast was observed. Radiographs were obtained for documentation purposes.    The injectate was administered into the level noted above.   Additional Comments:  No complications occurred Dressing: 2 x 2 sterile gauze and Band-Aid    Post-procedure details: Patient was observed during the procedure. Post-procedure instructions were reviewed.  Patient left the clinic in stable condition.

## 2023-05-03 ENCOUNTER — Telehealth: Payer: Self-pay | Admitting: Orthopaedic Surgery

## 2023-05-03 NOTE — Telephone Encounter (Signed)
Received $25.00 cash medical records release form and disability paperwork/forwarded to [employer] today

## 2023-05-11 ENCOUNTER — Telehealth: Payer: Self-pay | Admitting: Orthopaedic Surgery

## 2023-05-11 ENCOUNTER — Other Ambulatory Visit: Payer: Self-pay | Admitting: Orthopaedic Surgery

## 2023-05-11 NOTE — Telephone Encounter (Signed)
Patient called asked if she can be referred to a pain clinic?  The number to contact patient is [phone]

## 2023-05-12 DIAGNOSIS — G4733 Obstructive sleep apnea (adult) (pediatric): Secondary | ICD-10-CM | POA: Diagnosis not present

## 2023-05-15 ENCOUNTER — Other Ambulatory Visit: Payer: Self-pay | Admitting: Internal Medicine

## 2023-05-17 ENCOUNTER — Encounter: Payer: Self-pay | Admitting: Internal Medicine

## 2023-05-17 ENCOUNTER — Ambulatory Visit: Payer: PRIVATE HEALTH INSURANCE | Attending: Internal Medicine | Admitting: Internal Medicine

## 2023-05-17 VITALS — BP 137/83 | HR 77 | Temp 97.9°F | Ht 65.0 in | Wt 188.0 lb

## 2023-05-17 DIAGNOSIS — I10 Essential (primary) hypertension: Secondary | ICD-10-CM | POA: Diagnosis not present

## 2023-05-17 DIAGNOSIS — G8929 Other chronic pain: Secondary | ICD-10-CM

## 2023-05-17 DIAGNOSIS — L309 Dermatitis, unspecified: Secondary | ICD-10-CM

## 2023-05-17 DIAGNOSIS — M545 Low back pain, unspecified: Secondary | ICD-10-CM

## 2023-05-17 MED ORDER — VALACYCLOVIR HCL 1 G PO TABS
1000.0000 mg | ORAL_TABLET | Freq: Two times a day (BID) | ORAL | 0 refills | Status: AC
Start: 2023-05-17 — End: 2023-05-24

## 2023-05-17 MED ORDER — VALSARTAN 80 MG PO TABS
80.0000 mg | ORAL_TABLET | Freq: Every day | ORAL | 6 refills | Status: DC
Start: 2023-05-17 — End: 2023-06-24

## 2023-05-17 MED ORDER — CYCLOBENZAPRINE HCL 5 MG PO TABS: ORAL_TABLET | ORAL | 1 refills | Status: DC

## 2023-05-17 NOTE — Telephone Encounter (Signed)
Requested Prescriptions  Pending Prescriptions Disp Refills   valsartan (DIOVAN) 40 MG tablet [Pharmacy Med Name: Valsartan 40 MG Oral Tablet] 90 tablet 1    Sig: Take 1 tablet by mouth once daily     Cardiovascular:  Angiotensin Receptor Blockers Passed - 05/15/2023  9:58 AM      Passed - Cr in normal range and within 180 days    Creat  Date Value Ref Range Status  10/01/2021 1.12 (H) 0.50 - 0.99 mg/dL Final   Creatinine, Ser  Date Value Ref Range Status  03/28/2023 0.88 0.44 - 1.00 mg/dL Final         Passed - K in normal range and within 180 days    Potassium  Date Value Ref Range Status  03/28/2023 4.9 3.5 - 5.1 mmol/L Final         Passed - Patient is not pregnant      Passed - Last BP in normal range    BP Readings from Last 1 Encounters:  04/23/23 139/85         Passed - Valid encounter within last 6 months    Recent Outpatient Visits           5 months ago Throat discomfort   [place] [facility] Marcine Matar, MD   8 months ago Essential hypertension   [place] [facility] Marcine Matar, MD   1 year ago Essential hypertension   [place] [facility] Cuc[place], Cornelius Moras, RPH-CPP   1 year ago Essential hypertension   [place] [facility] [place], [place] L, RPH-CPP   1 year ago Acute bilateral low back pain without sciatica   [place] [facility] Marcine Matar, MD       Future Appointments             Today Marcine Matar, MD  [facility]

## 2023-05-17 NOTE — Progress Notes (Signed)
Patient ID: LUCERITO ROSINSKI, female    DOB: Jul 19, 1976  MRN: [id number]  CC: Hypertension (HTN f/u. Med refill. /Pt scheduled for pain management appt on 7/25/Swollen area on L foot, itching X2 days)   Subjective: Francoise Chojnowski is a 47 y.o. female who presents for chronic ds management Her concerns today include:  Patient with history of HTN, former tob dep, GERD, obesity,  PTSD, Bipolar 2,  CTS RT hand, family history of breast cancer in her mother.   My first year medical student Ms. Leonor Liv took the history from this patient.  HTN: Reports compliance with Diovan 40 mg daily.  Last checked blood pressure 2 to 3 months ago and blood pressure was elevated.  No chest pains, shortness of breath, lower extremity edema.  Complains of swollen area on left foot that started last evening.  It is red and itchy.  She put some hydrocortisone cream on it last night and felt it helped some.  She has been seeing Dr. Ophelia Charter for lower back pain.  Had MRI that showed shallow broad based disc protrusion at L5-S1 with slight mass effect on the ventral thecal sac and potential irritation of the S1 nerve roots.  She had an epidural steroid injection which she felt did not help.  She states that he wanted her to try another 1 but she declined.  Did not find physical therapy to be helpful.  He had given her a work restriction of no lifting more than 20 pounds for 6 weeks.  Her job could not accommodate that so they took her out of work and told her to use short-term disability.  In the interim, she secured an appointment with [facility] for later this month.  Requests refill on Flexeril.   Patient Active Problem List   Diagnosis Date Noted   Protrusion of lumbar intervertebral disc 03/24/2023   Depression with anxiety 11/30/2022   Partial thickness rotator cuff tear 11/16/2022   Adjustment disorder with mixed anxiety and depressed mood 02/11/2022   Rotator cuff tendinitis, right  11/14/2021   S/P cervical spinal fusion 06/17/2021   Spinal stenosis of cervical region 04/02/2021   COVID-19 vaccine series not completed 12/20/2020   Influenza vaccine refused 11/07/2020   Essential hypertension 11/07/2020   Obesity (BMI 30.0-34.9) 11/07/2020   Tobacco dependence 11/07/2020   Gastroesophageal reflux disease without esophagitis 11/07/2020   History of abnormal cervical Pap smear 11/07/2020   History of bipolar disorder 11/07/2020   Bipolar 2 disorder, major depressive episode (HCC) 06/12/2020   PTSD (post-traumatic stress disorder) 06/12/2020   Chronic pain of left knee 10/05/2019   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 8 g 0   ARIPiprazole (ABILIFY) 5 MG tablet Take 1 tablet (5 mg total) by mouth daily. 30 tablet 1   celecoxib (CELEBREX) 200 MG capsule TAKE 1 CAPSULE BY MOUTH TWICE DAILY AS NEEDED BETWEEN  MEALS. 60 capsule 0   Cholecalciferol (VITAMIN D) 50 MCG (2000 UT) tablet Take 2,000 Units by mouth daily.     cyclobenzaprine (FLEXERIL) 5 MG tablet TAKE 1 TABLET BY MOUTH ONCE  DAILY AS NEEDED FOR MUSCLE SPASM 30 tablet 0   FLUoxetine (PROZAC) 10 MG capsule Take 1 capsule (10 mg total) by mouth daily. 30 capsule 1   furosemide (LASIX) 20 MG tablet 1 tab PO Q Mon/Wed/Friday 12 tablet 3   hydrOXYzine (ATARAX) 10 MG tablet Take 1 tablet (10 mg total) by mouth 3 (three) times daily as needed. 75 tablet 1   prazosin (MINIPRESS) 1 MG capsule Take 1 capsule (1 mg total) by mouth at bedtime. 30 capsule 1   traZODone (DESYREL) 50 MG tablet Take 1 tablet (50 mg total) by mouth at bedtime as needed for sleep. 30 tablet 1    valsartan (DIOVAN) 40 MG tablet Take 1 tablet by mouth once daily 90 tablet 1   No current facility-administered medications on file prior to visit.    Allergies  Allergen Reactions   Amlodipine Swelling    Lower extremity edema.   Hydrochlorothiazide Other (See Comments)    Cough   Lamotrigine Hives    Social History   Socioeconomic History   Marital status: Single    Spouse name: Not on file   Number of children: 1   Years of education: Not on file   Highest education level: Some college, no degree  Occupational History   Not on file  Tobacco Use   Smoking status: Former    Current packs/day: 0.25    Types: Cigarettes   Smokeless tobacco: Never  Vaping Use   Vaping status: Never Used  Substance and Sexual Activity   Alcohol use: Yes    Alcohol/week: 2.0 standard drinks of alcohol    Types: 1 Glasses of wine, 1 Cans of beer per week   Drug use: No   Sexual activity: Not Currently    Birth control/protection: Surgical  Other Topics Concern   Not on file  Social History Narrative   Not on file   Social Determinants of Health   Financial Resource Strain: High Risk (04/02/2023)   Overall Financial Resource Strain (CARDIA)    Difficulty of Paying Living Expenses: Very hard  Food Insecurity: Food Insecurity Present (04/02/2023)   Hunger Vital Sign    Worried About Running Out of Food in the Last Year: Often true    Ran Out of Food in the Last Year: Often true  Transportation Needs: No Transportation Needs (04/02/2023)   PRAPARE - Administrator, Civil Service (Medical): No    Lack of Transportation (Non-Medical): No  Physical Activity: Sufficiently Active (04/02/2023)   Exercise Vital Sign    Days of Exercise per Week: 5 days    Minutes of Exercise per Session: 150+ min  Stress: Stress Concern Present (04/02/2023)   [employer] of Occupational Health - Occupational Stress Questionnaire    Feeling of Stress : To some extent  Social Connections:  Socially Isolated (04/02/2023)   Social Connection and Isolation Panel [NHANES]    Frequency of Communication with Friends and Family: Three times a week    Frequency of Social Gatherings with Friends and Family: More than three times a week    Attends Religious Services: Never    Database administrator or Organizations: No    Attends Banker Meetings: Never    Marital Status: Never married  Intimate Partner Violence: Not At Risk (11/30/2022)   Humiliation, Afraid, Rape, and Kick questionnaire    Fear of Current or Ex-Partner: No    Emotionally Abused: No    Physically Abused: No  Sexually Abused: No    Family History  Problem Relation Age of Onset   Hypertension Mother    Cancer Mother        breast   Anxiety disorder Mother    Hypertension Father    Schizophrenia Father    Bipolar disorder Father    Anxiety disorder Father    Colon cancer Neg Hx    Colon polyps Neg Hx    Esophageal cancer Neg Hx    Rectal cancer Neg Hx    Stomach cancer Neg Hx     Past Surgical History:  Procedure Laterality Date   ABDOMINAL HYSTERECTOMY     ANTERIOR CERVICAL DECOMP/DISCECTOMY FUSION N/A 06/06/2021   Procedure: C4-5, C5-6 ANTERIOR CERVICAL DISCECTOMY FUSION, ALLOGRAFT, PLATE;  Surgeon: Eldred Manges, MD;  Location: [facility] OR;  Service: Orthopedics;  Laterality: N/A;   CHOLECYSTECTOMY     DILATION AND CURETTAGE OF UTERUS     FOOT SURGERY Bilateral    two pins in each foot   JOINT REPLACEMENT Left 2018   partial knee replacement   KNEE SURGERY Left 2018   partial knee replacement   NECK SURGERY  06/06/2021   C4-5, C5-6 ANTERIOR CERVICAL DISCECTOMY FUSION, ALLOGRAFT, PLATE    ROS: Review of Systems Negative except as stated above  PHYSICAL EXAM: BP 137/83 (BP Location: Left Arm, Patient Position: Sitting, Cuff Size: Normal)   Pulse 77   Temp 97.9 F (36.6 C) (Oral)   Ht 5\' 5"  (1.651 m)   Wt 188 lb (85.3 kg)   SpO2 98%   BMI 31.28 kg/m   Physical Exam  General  appearance - alert, well appearing, and in no distress Mental status - normal mood, behavior, speech, dress, motor activity, and thought processes Chest - clear to auscultation, no wheezes, rales or rhonchi, symmetric air entry Heart - normal rate, regular rhythm, normal S1, S2, no murmurs, rubs, clicks or gallops Extremities - peripheral pulses normal, no pedal edema, no clubbing or cyanosis Skin -left foot: She has about a 3 cm area of mild erythema with several small cropped vesicles.  Area is slightly tender to touch.      Latest Ref Rng & Units 03/28/2023   12:45 PM 04/21/2022    3:42 PM 10/01/2021    5:23 PM  CMP  Glucose 70 - 99 mg/dL 95  82  86   BUN 6 - 20 mg/dL 13  17  21    Creatinine 0.44 - 1.00 mg/dL 1.30  8.65  7.84   Sodium 135 - 145 mmol/L 140  138  143   Potassium 3.5 - 5.1 mmol/L 4.9  4.5  4.3   Chloride 98 - 111 mmol/L 102  103  107   CO2 22 - 32 mmol/L 27  26  29    Calcium 8.9 - 10.3 mg/dL 9.6  9.1  9.5   Total Protein 6.5 - 8.1 g/dL 6.8  6.9  6.6   Total Bilirubin 0.3 - 1.2 mg/dL 0.8  0.2  0.3   Alkaline Phos 38 - 126 U/L 88  94    AST 15 - 41 U/L 21  16  22    ALT 0 - 44 U/L 16  11  19     Lipid Panel     Component Value Date/Time   CHOL 165 11/07/2020 1610   TRIG 84 11/07/2020 1610   HDL 63 11/07/2020 1610   CHOLHDL 2.6 11/07/2020 1610   LDLCALC 86 11/07/2020 1610    CBC  Component Value Date/Time   WBC 5.4 03/28/2023 1245   RBC 4.50 03/28/2023 1245   HGB 13.9 03/28/2023 1245   HGB 13.3 11/23/2022 1556   HCT 42.5 03/28/2023 1245   HCT 40.6 11/23/2022 1556   PLT 234 03/28/2023 1245   PLT 268 11/23/2022 1556   MCV 94.4 03/28/2023 1245   MCV 88 11/23/2022 1556   MCH 30.9 03/28/2023 1245   MCHC 32.7 03/28/2023 1245   RDW 12.8 03/28/2023 1245   RDW 13.2 11/23/2022 1556   LYMPHSABS 1.5 03/28/2023 1245   MONOABS 0.4 03/28/2023 1245   EOSABS 0.1 03/28/2023 1245   BASOSABS 0.0 03/28/2023 1245    ASSESSMENT AND PLAN:  1. Essential  hypertension Not at goal.  Recommend increasing valsartan to 80 mg daily.  After being on increased dose for 1 week, she should return to the lab to have chemistry checked. - valsartan (DIOVAN) 80 MG tablet; Take 1 tablet (80 mg total) by mouth daily.  Dispense: 30 tablet; Refill: 6 - Basic Metabolic Panel; Future  2. Dermatitis of left foot Looks like her herpetiform type rash versus limited shingles. - valACYclovir (VALTREX) 1000 MG tablet; Take 1 tablet (1,000 mg total) by mouth 2 (two) times daily for 7 days.  Dispense: 14 tablet; Refill: 0  3. Chronic low back pain, unspecified back pain laterality, unspecified whether sciatica present - cyclobenzaprine (FLEXERIL) 5 MG tablet; TAKE 1 TABLET BY MOUTH ONCE DAILY AS NEEDED FOR MUSCLE SPASM  Dispense: 30 tablet; Refill: 1     Patient was given the opportunity to ask questions.  Patient verbalized understanding of the plan and was able to repeat key elements of the plan.   This documentation was completed using Paediatric nurse.  Any transcriptional errors are unintentional.  No orders of the defined types were placed in this encounter.    Requested Prescriptions   Pending Prescriptions Disp Refills   cyclobenzaprine (FLEXERIL) 5 MG tablet 30 tablet 0    Sig: TAKE 1 TABLET BY MOUTH ONCE DAILY AS NEEDED FOR MUSCLE SPASM    No follow-ups on file.  Jonah Blue, MD, FACP

## 2023-05-21 ENCOUNTER — Telehealth: Payer: Self-pay | Admitting: Orthopaedic Surgery

## 2023-05-21 ENCOUNTER — Other Ambulatory Visit: Payer: Self-pay | Admitting: Orthopaedic Surgery

## 2023-05-21 NOTE — Telephone Encounter (Signed)
Patient called. Says she is not better. Would like a scan. Her cb [phone]

## 2023-05-24 ENCOUNTER — Ambulatory Visit: Payer: 59 | Attending: Internal Medicine

## 2023-05-24 DIAGNOSIS — I10 Essential (primary) hypertension: Secondary | ICD-10-CM

## 2023-05-24 NOTE — Telephone Encounter (Signed)
I called patient and advised. She states that the injection made her much worse and she will never get one again. She does not want referral entered.

## 2023-05-25 ENCOUNTER — Telehealth: Payer: Self-pay | Admitting: Orthopaedic Surgery

## 2023-05-25 LAB — BASIC METABOLIC PANEL
BUN/Creatinine Ratio: 29 — ABNORMAL HIGH (ref 9–23)
BUN: 22 mg/dL (ref 6–24)
CO2: 25 mmol/L (ref 20–29)
Calcium: 9.2 mg/dL (ref 8.7–10.2)
Chloride: 101 mmol/L (ref 96–106)
Creatinine, Ser: 0.76 mg/dL (ref 0.57–1.00)
Glucose: 92 mg/dL (ref 70–99)
Potassium: 5.2 mmol/L (ref 3.5–5.2)
Sodium: 141 mmol/L (ref 134–144)
eGFR: 96 mL/min/{1.73_m2} (ref >59)

## 2023-05-25 NOTE — Telephone Encounter (Signed)
Pt needs assistance with paperwork keeping her out of work please advise she would like a phone call describing the matter.

## 2023-05-26 ENCOUNTER — Telehealth: Payer: Self-pay | Admitting: Orthopaedic Surgery

## 2023-05-26 ENCOUNTER — Ambulatory Visit (INDEPENDENT_AMBULATORY_CARE_PROVIDER_SITE_OTHER): Payer: 59 | Admitting: Mental Health

## 2023-05-26 DIAGNOSIS — F419 Anxiety disorder, unspecified: Secondary | ICD-10-CM

## 2023-05-26 DIAGNOSIS — F3181 Bipolar II disorder: Secondary | ICD-10-CM | POA: Diagnosis not present

## 2023-05-26 NOTE — Telephone Encounter (Signed)
Patient called. Would like a letter to be out of work. Her cb# [phone]. She would like a call.

## 2023-05-26 NOTE — Telephone Encounter (Signed)
I called, duplicate message in chart.

## 2023-05-26 NOTE — Progress Notes (Signed)
THERAPIST PROGRESS NOTE Virtual Visit via Video Note  I connected with Theresa Cantrell on 05/26/23 at  4:00 PM EDT by a video enabled telemedicine application and verified that I am speaking with the correct person using two identifiers.  Location: Patient: home address on file Provider: office   I discussed the limitations of evaluation and management by telemedicine and the availability of in person appointments. The patient expressed understanding and agreed to proceed.  I discussed the assessment and treatment plan with the patient. The patient was provided an opportunity to ask questions and all were answered. The patient agreed with the plan and demonstrated an understanding of the instructions.   The patient was advised to call back or seek an in-person evaluation if the symptoms worsen or if the condition fails to improve as anticipated.  I provided  minutes of non-face-to-face time during this encounter.   Dorris Singh, [facility]   Session Time: 4:01 pm ( 40 minutes)   Participation Level: Active  Behavioral Response: CasualAlertAdequate  Type of Therapy: Individual Therapy  Treatment Goals addressed: STG: Theresa Cantrell will increase management of moods and stressors AEB development of x 3 effective coping skills with ability to process stressors in effective manner per self report within the next 90 days   ProgressTowards Goals: Progressing  Interventions: CBT and Supportive  Summary: Theresa Cantrell is a 47 y.o. female who presents with dx of bipolar disorder and anxiety. Presents for session alert and oriented; mood and affect low; depressed. Tearful. Speech clear and coherent at normal rate and tone. Engaged and receptive to interventions. 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 Shares reduced level of functioning and ability to do things in a  physical nature due to severe back pain, knee pain. Shares physician denies to want her to go to pain management and shares would refer back to physical therapy although previous physical therapy noted for services to not be beneficial for her and increasing pain. Shares has decided to seek a second opinion and has found a pain management provider herself without a referral. Shares working to adjust to new limitations. Shares family is supportive and helping financially. Shares to be spending time with mother. Engaging in cognitive coping and attempting to not allow thoughts to overwhelm her. Increase in sxs of depression due to current stressors; ongoing progress with goals.   Suicidal/Homicidal: Nowithout intent/plan  Therapist Response: Therapist engaged Theresa Cantrell in tele-therapy session. Completed check in and assessed for current level of functioning, sxs management and level of stressors. Explored ability to engage coping skills and ability to navigate stressors. Supported in processing thoughts in regards to inability to work. Provided support and encouragement; validated feelings. Explored with Theresa Cantrell in next steps in moving forward in managing her medical health. 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 Explored supports she has and ability to use natural supports as needed. Encouraged rest and following up with needed providers. Encouraged balanced thoughts and working to avoid distorted anxious thinking. Reviewed session and provided follow up. No safety concerns reported.   Plan: Return again in  x 6 weeks.  Diagnosis: Bipolar 2 disorder, major depressive episode (HCC)  Anxiety  Collaboration of Care: Other None  Patient/Guardian was advised Release of Information must be obtained prior to any record release in order to collaborate  their care with an outside provider. Patient/Guardian was advised if they have not already done so to contact the registration department to  sign all necessary forms in order for [place] to release information regarding their care.   Consent: Patient/Guardian gives verbal consent for treatment and assignment of benefits for services provided during this visit. Patient/Guardian expressed understanding and agreed to proceed.   Stephan Minister [place], [facility] 05/26/2023

## 2023-05-27 DIAGNOSIS — M5116 Intervertebral disc disorders with radiculopathy, lumbar region: Secondary | ICD-10-CM | POA: Diagnosis not present

## 2023-05-27 DIAGNOSIS — M5416 Radiculopathy, lumbar region: Secondary | ICD-10-CM | POA: Diagnosis not present

## 2023-05-27 DIAGNOSIS — G8929 Other chronic pain: Secondary | ICD-10-CM | POA: Diagnosis not present

## 2023-06-04 ENCOUNTER — Encounter: Payer: Self-pay | Admitting: Orthopaedic Surgery

## 2023-06-04 ENCOUNTER — Ambulatory Visit (INDEPENDENT_AMBULATORY_CARE_PROVIDER_SITE_OTHER): Payer: 59 | Admitting: Orthopaedic Surgery

## 2023-06-04 VITALS — Ht 65.0 in | Wt 188.0 lb

## 2023-06-04 DIAGNOSIS — M5126 Other intervertebral disc displacement, lumbar region: Secondary | ICD-10-CM | POA: Diagnosis not present

## 2023-06-04 NOTE — Progress Notes (Signed)
Office Visit Note   Patient: Theresa Cantrell           Date of Birth: [birth year]           MRN: [id number] Visit Date: 06/04/2023              Requested by: Marcine Matar, MD [address] [place] [address] [place],  [place] [address] PCP: Marcine Matar, MD   Assessment & Plan: Visit Diagnoses:  1. Protrusion of lumbar intervertebral disc     Plan: Discussed with patient she has some disc degeneration lumbar without evidence of central or severe foraminal compression.  She does have some endplate edema changes at L5-S1 with disc space narrowing.  Work slip given no work until 07/26/2023.  She can resume work on 07/26/2023 without restrictions.  She thinks the job she has been doing may be too demanding and she is looking for other options as well.  Follow-Up Instructions: No follow-ups on file.   Orders:  No orders of the defined types were placed in this encounter.  No orders of the defined types were placed in this encounter.     Procedures: No procedures performed   Clinical Data: No additional findings.   Subjective: Chief Complaint  Patient presents with   Lower Back - Pain, Follow-up    HPI 47 year old female returns with ongoing problems with her back.  She states the epidural injection L5-S1 only gave her relief for 2 days and she thinks some ways it is actually made her worse.  She continues to have problems with bending and states she has had some cramping in her legs show 1 episode where she fell hit her head.  Patient is ambulating with a cane.  Patient's had MRI scan right shoulder which showed some moderate supraspinatus tendinopathy and low-grade partial-thickness bursal surface tear at the insertion.  Mild glenohumeral arthritis.  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  MRI scan lumbar 03/23/2023 showed some lateral recess narrowing L2-3, L3-4 and L4-5.  Shallow disc protrusion L5-S1 without central compression.  Mild left  foraminal narrowing was noted.  She states she feels like she has more symptoms on the right leg than left leg.  Review of Systems 14 point system update otherwise unchanged.   Objective: Vital Signs: Ht 5\' 5"  (1.651 m)   Wt 188 lb (85.3 kg)   BMI 31.28 kg/m   Physical Exam Constitutional:      Appearance: She is well-developed.  HENT:     Head: Normocephalic.     Right Ear: External ear normal.     Left Ear: External ear normal. There is no impacted cerumen.  Eyes:     Pupils: Pupils are equal, round, and reactive to light.  Neck:     Thyroid: No thyromegaly.     Trachea: No tracheal deviation.  Cardiovascular:     Rate and Rhythm: Normal rate.  Pulmonary:     Effort: Pulmonary effort is normal.  Abdominal:     Palpations: Abdomen is soft.  Musculoskeletal:     Cervical back: No rigidity.  Skin:    General: Skin is warm and dry.  Neurological:     Mental Status: She is alert and oriented to person, place, and time.  Psychiatric:        Behavior: Behavior normal.     Ortho Exam patient has normal heel-toe gait.  She has a cane she uses when she ambulates in the community.  Negative logroll hips knees reach full extension no rash or exposed skin no atrophy.  Specialty Comments:  MRI LUMBAR SPINE WITHOUT CONTRAST   TECHNIQUE: Multiplanar, multisequence MR imaging of the lumbar spine was performed. No intravenous contrast was administered.   COMPARISON:  Lumbar spine radiographs 01/12/2023   FINDINGS: Segmentation: There are five lumbar type vertebral bodies. The last full intervertebral disc space is labeled L5-S1. This correlates with the radiographs.   Alignment: Left convex scoliotic curvature but normal alignment in the sagittal plane.   Vertebrae: No acute bony findings or bone lesions. Moderate endplate reactive changes at L5-S1.   Conus medullaris and cauda equina: Conus extends to the L2 level. Conus and cauda equina appear normal.   Paraspinal  and other soft tissues: No significant paraspinal or retroperitoneal findings.   Disc levels:   T12-L1: No significant findings.   L1-2: No significant findings.   L2-3: Mild annular bulge and mild facet disease with mild bilateral lateral recess encroachment but no significant spinal or foraminal stenosis.   L3-4: Bulging annulus with flattening of the ventral thecal sac in combination with mild facet disease and ligamentum flavum thickening contributing to mild bilateral lateral recess stenosis. No significant spinal or foraminal stenosis.   L4-5: Bulging annulus, moderate facet disease and ligamentum flavum thickening with mild bilateral lateral recess encroachment but no significant spinal or foraminal stenosis.   L5-S1: Shallow broad-based disc protrusion with slight mass effect on the ventral thecal sac and potential irritation of the S1 nerve roots. There is also mild left foraminal narrowing.   IMPRESSION: 1. Mild bilateral lateral recess encroachment at L2-3, L3-4 and L4-5. 2. Shallow broad-based disc protrusion at L5-S1 with slight mass effect on the ventral thecal sac and potential irritation of the S1 nerve roots. There is also mild left foraminal narrowing.     Electronically Signed   By: Rudie Meyer M.D.   On: 03/23/2023 13:25  Imaging: No results found.   PMFS History: Patient Active Problem List   Diagnosis Date Noted   Protrusion of lumbar intervertebral disc 03/24/2023   Depression with anxiety 11/30/2022   Partial thickness rotator cuff tear 11/16/2022   Adjustment disorder with mixed anxiety and depressed mood 02/11/2022   Rotator cuff tendinitis, right 11/14/2021   S/P cervical spinal fusion 06/17/2021   Spinal stenosis of cervical region 04/02/2021   COVID-19 vaccine series not completed 12/20/2020   Influenza vaccine refused 11/07/2020   Essential hypertension 11/07/2020   Obesity (BMI 30.0-34.9) 11/07/2020   Tobacco dependence  11/07/2020   Gastroesophageal reflux disease without esophagitis 11/07/2020   History of abnormal cervical Pap smear 11/07/2020   History of bipolar disorder 11/07/2020   Bipolar 2 disorder, major depressive episode (HCC) 06/12/2020   PTSD (post-traumatic stress disorder) 06/12/2020   Chronic pain of left knee 10/05/2019   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   Neuropathy 02/07/2014   Hot flashes 02/07/2014   Weight loss 02/07/2014   Anxiety    Past Medical History:  Diagnosis Date   Anxiety    Arthritis    Bipolar 1 disorder (HCC)    Depression    GERD (gastroesophageal reflux disease)    Hypertension    PTSD (post-traumatic stress disorder)     Family History  Problem Relation Age of Onset   Hypertension Mother    Cancer Mother        breast   Anxiety disorder Mother    Hypertension Father    Schizophrenia Father  Bipolar disorder Father    Anxiety disorder Father    Colon cancer Neg Hx    Colon polyps Neg Hx    Esophageal cancer Neg Hx    Rectal cancer Neg Hx    Stomach cancer Neg Hx     Past Surgical History:  Procedure Laterality Date   ABDOMINAL HYSTERECTOMY     ANTERIOR CERVICAL DECOMP/DISCECTOMY FUSION N/A 06/06/2021   Procedure: C4-5, C5-6 ANTERIOR CERVICAL DISCECTOMY FUSION, ALLOGRAFT, PLATE;  Surgeon: Eldred Manges, MD;  Location: [facility] OR;  Service: Orthopedics;  Laterality: N/A;   CHOLECYSTECTOMY     DILATION AND CURETTAGE OF UTERUS     FOOT SURGERY Bilateral    two pins in each foot   JOINT REPLACEMENT Left 2018   partial knee replacement   KNEE SURGERY Left 2018   partial knee replacement   NECK SURGERY  06/06/2021   C4-5, C5-6 ANTERIOR CERVICAL DISCECTOMY FUSION, ALLOGRAFT, PLATE   Social History   Occupational History   Not on file  Tobacco Use   Smoking status: Former    Current packs/day: 0.25    Types: Cigarettes   Smokeless tobacco: Never  Vaping Use   Vaping status: Never Used  Substance and Sexual  Activity   Alcohol use: Yes    Alcohol/week: 2.0 standard drinks of alcohol    Types: 1 Glasses of wine, 1 Cans of beer per week   Drug use: No   Sexual activity: Not Currently    Birth control/protection: Surgical

## 2023-06-11 ENCOUNTER — Telehealth: Payer: Self-pay | Admitting: Internal Medicine

## 2023-06-11 NOTE — Telephone Encounter (Signed)
Pt is calling to request a script for a raised toilet. Pt is reporting that Dr. Laural Benes Is aware of her lower back pain. Please advise [phone]

## 2023-06-15 ENCOUNTER — Telehealth: Payer: Self-pay | Admitting: Orthopaedic Surgery

## 2023-06-15 ENCOUNTER — Ambulatory Visit: Payer: Self-pay | Admitting: Pharmacist

## 2023-06-15 NOTE — Telephone Encounter (Signed)
Called & scheduled at telehealth appointment for 06/17/2023. Patient confirmed appointment.

## 2023-06-15 NOTE — Telephone Encounter (Signed)
Physical therapy notes faxed to [facility] [phone] per patients request. Pts callback [phone]

## 2023-06-17 ENCOUNTER — Telehealth (HOSPITAL_BASED_OUTPATIENT_CLINIC_OR_DEPARTMENT_OTHER): Payer: 59 | Admitting: Internal Medicine

## 2023-06-17 DIAGNOSIS — G8929 Other chronic pain: Secondary | ICD-10-CM

## 2023-06-17 DIAGNOSIS — M545 Low back pain, unspecified: Secondary | ICD-10-CM | POA: Diagnosis not present

## 2023-06-17 DIAGNOSIS — Z9181 History of falling: Secondary | ICD-10-CM

## 2023-06-17 NOTE — Progress Notes (Signed)
Patient ID: Theresa Cantrell, female   DOB: 05/20/1976, 47 y.o.   MRN: [id number] Virtual Visit via Video Note  I connected with Theresa Cantrell on 06/17/2023 at 8:10 AM by a video enabled telemedicine application and verified that I am speaking with the correct person using two identifiers.  Location: Patient:  home Provider: Office   I discussed the limitations of evaluation and management by telemedicine and the availability of in person appointments. The patient expressed understanding and agreed to proceed.  History of Present Illness: Patient with history of HTN, former tob dep, GERD, obesity,  PTSD, Bipolar 2,  CTS RT hand, family history of breast cancer in her mother.  Patient requested to have a raised toilet with grip on either side.  Due to her chronic back issues, she has been having problems getting on and off the toilet because her toilet sits too low.  She has had 2 falls in the last week while getting on and off the toilet.  She has been seeing Dr. Ophelia Charter for her back pain issues and has had MRI that showed shallow broad disc protrusion at L5-S1 with slight mass effect on the ventral thecal sac and potential irritation of the S1 nerve roots. Outpatient Encounter Medications as of 06/17/2023  Medication Sig   albuterol (VENTOLIN HFA) 108 (90 Base) MCG/ACT inhaler Inhale 2 puffs into the lungs every 6 (six) hours as needed for wheezing or shortness of breath.   ARIPiprazole (ABILIFY) 5 MG tablet Take 1 tablet (5 mg total) by mouth daily.   celecoxib (CELEBREX) 200 MG capsule TAKE 1 CAPSULE BY MOUTH TWICE DAILY AS NEEDED BETWEEN  MEALS.   Cholecalciferol (VITAMIN D) 50 MCG (2000 UT) tablet Take 2,000 Units by mouth daily.   cyclobenzaprine (FLEXERIL) 5 MG tablet TAKE 1 TABLET BY MOUTH ONCE DAILY AS NEEDED FOR MUSCLE SPASM   FLUoxetine (PROZAC) 10 MG capsule Take 1 capsule (10 mg total) by mouth daily.   furosemide (LASIX) 20 MG tablet 1 tab PO Q Mon/Wed/Friday   hydrOXYzine (ATARAX) 10  MG tablet Take 1 tablet (10 mg total) by mouth 3 (three) times daily as needed.   prazosin (MINIPRESS) 1 MG capsule Take 1 capsule (1 mg total) by mouth at bedtime.   traZODone (DESYREL) 50 MG tablet Take 1 tablet (50 mg total) by mouth at bedtime as needed for sleep.   valsartan (DIOVAN) 80 MG tablet Take 1 tablet (80 mg total) by mouth daily.   No facility-administered encounter medications on file as of 06/17/2023.      Observations/Objective: Middle-age African-American female in NAD  Assessment and Plan: 1. Chronic low back pain, unspecified back pain laterality, unspecified whether sciatica present 2. History of recent fall -Patient has known chronic lower back pain issues for which she is being followed by orthopedics.  Recent MRI showed broad disc bulge protrusion at L5-S1.  She reports 2 falls in the past week when trying to get on and off the toilet.  I think it is reasonable for her to have a raised toilet to allow her to toilet safely. - For home use only DME Other see comment   Follow Up Instructions: As needed.   I discussed the assessment and treatment plan with the patient. The patient was provided an opportunity to ask questions and all were answered. The patient agreed with the plan and demonstrated an understanding of the instructions.   The patient was advised to call back or seek an in-person evaluation if the  symptoms worsen or if the condition fails to improve as anticipated.  I spent 8 minutes dedicated to the care of this patient on the date of this encounter to include previsit review of patient's telephone message from 06/11/2023, face-to-face time with patient discussing diagnosis and management and postvisit entering of orders.  This note has been created with Education officer, environmental. Any transcriptional errors are unintentional.  Jonah Blue, MD

## 2023-06-24 ENCOUNTER — Ambulatory Visit: Payer: 59 | Attending: Internal Medicine | Admitting: Pharmacist

## 2023-06-24 ENCOUNTER — Encounter: Payer: Self-pay | Admitting: Pharmacist

## 2023-06-24 ENCOUNTER — Ambulatory Visit: Payer: PRIVATE HEALTH INSURANCE | Admitting: Orthopaedic Surgery

## 2023-06-24 VITALS — BP 124/81

## 2023-06-24 DIAGNOSIS — I10 Essential (primary) hypertension: Secondary | ICD-10-CM | POA: Diagnosis not present

## 2023-06-24 MED ORDER — VALSARTAN 80 MG PO TABS
80.0000 mg | ORAL_TABLET | Freq: Every day | ORAL | 1 refills | Status: DC
Start: 2023-06-24 — End: 2023-11-18

## 2023-06-24 NOTE — Progress Notes (Signed)
   S:     No chief complaint on file.   Theresa Cantrell is a 47 y.o. female who presents for hypertension evaluation, education, and management. PMH is significant for HTN, former tob dep, GERD, obesity,  PTSD, Bipolar 2,  CTS RT hand, family history of breast cancer in her mother. Patient was referred and last seen by Primary Care Provider, Dr. Laural Benes, on 05/17/2023. BP was 137/83 mmHg at that visit and valsartan dose was increased.   Today, patient arrives in good spirits and presents without assistance. Denies dizziness, headache, blurred vision, swelling.   Patient reports hypertension longstanding.   Family/Social history:  Fhx: HTN, GAD Tobacco: former smoker Alcohol: none reported  Medication adherence reported. Patient has taken BP medications today.   Current antihypertensives include: valsartan 80 mg daily. **Pt takes furosemide 20 mg daily MWF for fluid.  **Also takes prazosin 1 mg in the evening but not for HTN   Antihypertensives tried in the past include: HCTZ (cough?), amlodipine (LE edema)  Reported home BP readings:  - No log with her today.   Patient reported dietary habits:  - Uses Mrs. Dash and other sodium-free seasonings when cooking.  - Drinks soda daily   Patient-reported exercise habits:  - None outside of work. 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   O:  Vitals:   06/24/23 1555  BP: 124/81    Last 3 Office BP readings: BP Readings from Last 3 Encounters:  06/24/23 124/81  05/17/23 137/83  04/23/23 139/85    BMET    Component Value Date/Time   NA 141 05/24/2023 0842   K 5.2 05/24/2023 0842   CL 101 05/24/2023 0842   CO2 25 05/24/2023 0842   GLUCOSE 92 05/24/2023 0842   GLUCOSE 95 03/28/2023 1245   BUN 22 05/24/2023 0842   CREATININE 0.76 05/24/2023 0842   CREATININE 1.12 (H) 10/01/2021 1723   CALCIUM 9.2 05/24/2023 0842   GFRNONAA >60 03/28/2023 1245   GFRAA 78 11/07/2020 1610    Renal function: CrCl cannot be  calculated (Patient's most recent lab result is older than the maximum 21 days allowed.).  Clinical ASCVD: No  The 10-year ASCVD risk score (Arnett DK, et al., 2019) is: 1.9%   Values used to calculate the score:     Age: 61 years     Sex: Female     Is Non-Hispanic African American: Yes     Diabetic: No     Tobacco smoker: No     Systolic Blood Pressure: 124 mmHg     Is BP treated: Yes     HDL Cholesterol: 63 mg/dL     Total Cholesterol: 165 mg/dL  A/P: Hypertension longstanding currently at goal on current medications. BP goal < 130/80 mmHg. Medication adherence appears appropriate.  -Continued current regimen.  -F/u labs ordered - none today. Lab recheck on 05/24/2023 showed good renal function and electrolytes nl. -Counseled on lifestyle modifications for blood pressure control including reduced dietary sodium, increased exercise, adequate sleep. -Encouraged patient to check BP at home and bring log of readings to next visit. Counseled on proper use of home BP cuff.   Results reviewed and written information provided. Patient verbalized understanding of treatment plan. Total time in face-to-face counseling 30 minutes.   F/u clinic visit w/ me prn.  PCP: 09/17/2023  Butch Penny, PharmD, BCACP, CPP Clinical Pharmacist [facility] [phone]

## 2023-06-28 NOTE — Telephone Encounter (Addendum)
DME ordered 06/17/2023 Patient made aware.  Advised to call [facility] supply to follow up.

## 2023-06-28 NOTE — Telephone Encounter (Signed)
The patient is calling back in inquiring about the raised toilet seat she talked to her provider about due to her low back pain. Please assist patient further

## 2023-07-09 ENCOUNTER — Ambulatory Visit (INDEPENDENT_AMBULATORY_CARE_PROVIDER_SITE_OTHER): Payer: MEDICAID | Admitting: Mental Health

## 2023-07-09 DIAGNOSIS — F3181 Bipolar II disorder: Secondary | ICD-10-CM

## 2023-07-09 DIAGNOSIS — F419 Anxiety disorder, unspecified: Secondary | ICD-10-CM | POA: Diagnosis not present

## 2023-07-09 NOTE — Progress Notes (Unsigned)
THERAPIST PROGRESS NOTE Virtual Visit via Video Note  I connected with [name] on 07/09/23 at 11:00 AM EDT by a video enabled telemedicine application and verified that I am speaking with the correct person using two identifiers.  Location: Patient: home address on file Provider: home office   I discussed the limitations of evaluation and management by telemedicine and the availability of in person appointments. The patient expressed understanding and agreed to proceed.  I discussed the assessment and treatment plan with the patient. The patient was provided an opportunity to ask questions and all were answered. The patient agreed with the plan and demonstrated an understanding of the instructions.   The patient was advised to call back or seek an in-person evaluation if the symptoms worsen or if the condition fails to improve as anticipated.  I provided 48 minutes of non-face-to-face time during this encounter.   Dorris Singh, [facility]   Session Time: 11:05 am ( 48 minutes)  Participation Level: Active  Behavioral Response: CasualAlertDysphoric  Type of Therapy: Individual Therapy  Treatment Goals addressed: STG: Myda will increase management of moods and stressors AEB development of x 3 effective coping skills with ability to process stressors in effective manner per self report within the next 90 days   ProgressTowards Goals: Progressing  Interventions: CBT and Supportive  Summary: JODETTE BRANDWEIN is a 47 y.o. female who presents with dx of bipolar disorder and anxiety. Presents for session alert and oriented; mood and affect low; depressed. Tearful. Speech clear and coherent at normal rate and tone. Engaged and receptive to interventions. Shares ongoing low mood and feelings of stress related to ongoing inability to work. Shares plans to follow through with application for disability. Shares increase in irritability and feelings of anger. Notes difficulty  doing things she needs to do around the home. Notes for daughter to be a support however she also works and has a home to attend to. Shares difficulty asking others for help and support, sharing feelings of pride. Open to asking PCP about in home aide with reported reduction in mobility and high degree of pain. Engaged with therapist and explored working to access needed supports and working to cope with spending time with friends. Denies SI/HI. Increase in sxs reported. Ongoing work on goals.    Suicidal/Homicidal: Nowithout intent/plan  Therapist Response: Therapist engaged Lashondra in tele-therapy session. Completed check in and assessed for current level of functioning, sxs management and level of stressors. Explored ability to engage coping skills and ability to navigate stressors. Supported in processing thoughts in regards to change in mobility and level of independence. Supported Danniella in processing through concerns and working to process what she can and not control. Encouraged balanced thinking and not focusing on what she can not do but things that are going well and areas of strengths. Encouraged  exploring ability to secure aide and engaging in balanced days. Reviewed session and provided follow up.   Plan: Return again in  x 5 weeks.  Diagnosis: Bipolar 2 disorder, major depressive episode (HCC)  Anxiety  Collaboration of Care: Other None  Patient/Guardian was advised Release of Information must be obtained prior to any record release in order to collaborate their care with an outside provider. Patient/Guardian was advised if they have not already done so to contact the registration department to sign all necessary forms in order for [place] to release information regarding their care.   Consent: Patient/Guardian gives verbal consent for treatment and assignment of  benefits for services provided during this visit. Patient/Guardian expressed understanding and agreed to proceed.   Stephan Minister McKinley, [facility] 07/09/2023

## 2023-08-02 ENCOUNTER — Other Ambulatory Visit: Payer: Self-pay | Admitting: Internal Medicine

## 2023-08-02 DIAGNOSIS — G8929 Other chronic pain: Secondary | ICD-10-CM

## 2023-08-02 NOTE — Telephone Encounter (Signed)
Medication Refill - Medication: cyclobenzaprine (FLEXERIL) 5 MG tablet   Has the patient contacted their pharmacy? No.  (Agent: If no, request that the patient contact the pharmacy for the refill. If patient does not wish to contact the pharmacy document the reason why and proceed with request.)   Preferred Pharmacy (with phone number or street name):  [facility] [address], [place] - [address]  [address] [place] [address]  Phone: [phone] Fax: [phone]  Hours: Not open 24 hours   Has the patient been seen for an appointment in the last year OR does the patient have an upcoming appointment? Yes.    Agent: Please be advised that RX refills may take up to 3 business days. We ask that you follow-up with your pharmacy.

## 2023-08-03 NOTE — Telephone Encounter (Signed)
Requested medications are due for refill today.  yes  Requested medications are on the active medications list.  yes  Last refill. 05/17/2023 #30 1 rf  Future visit scheduled.   yes  Notes to clinic.  Refill not delegated.    Requested Prescriptions  Pending Prescriptions Disp Refills   cyclobenzaprine (FLEXERIL) 5 MG tablet 30 tablet 1    Sig: TAKE 1 TABLET BY MOUTH ONCE DAILY AS NEEDED FOR MUSCLE SPASM     Not Delegated - Analgesics:  Muscle Relaxants Failed - 08/02/2023 11:41 AM      Failed - This refill cannot be delegated      Passed - Valid encounter within last 6 months    Recent Outpatient Visits           1 month ago Essential hypertension   [facility] [place], [place] L, RPH-CPP   1 month ago Chronic low back pain, unspecified back pain laterality, unspecified whether sciatica present   [facility] Marcine Matar, MD   2 months ago Essential hypertension   [place] [facility] Marcine Matar, MD   8 months ago Throat discomfort   [facility] Marcine Matar, MD   11 months ago Essential hypertension   [place] [facility] Marcine Matar, MD       Future Appointments             In 1 month Laural Benes, Binnie Rail, MD [facility]

## 2023-08-04 MED ORDER — CYCLOBENZAPRINE HCL 5 MG PO TABS: ORAL_TABLET | ORAL | 1 refills | Status: DC

## 2023-08-09 ENCOUNTER — Telehealth: Payer: Self-pay | Admitting: Internal Medicine

## 2023-08-09 ENCOUNTER — Encounter: Payer: Self-pay | Admitting: Internal Medicine

## 2023-08-10 NOTE — Telephone Encounter (Signed)
I spoke to the patient and explained that we can send a referral to [facility] requesting PCS.  They will contact her and schedule an in home assessment to determine if she qualifies for services.  I also explained that the focus of the service is for assisting with personal care, not housekeeping and she said she understood.  Completed PCS requested emailed to [facility]

## 2023-08-23 ENCOUNTER — Ambulatory Visit (INDEPENDENT_AMBULATORY_CARE_PROVIDER_SITE_OTHER): Payer: MEDICAID | Admitting: Mental Health

## 2023-08-23 DIAGNOSIS — F3181 Bipolar II disorder: Secondary | ICD-10-CM | POA: Diagnosis not present

## 2023-08-23 DIAGNOSIS — F419 Anxiety disorder, unspecified: Secondary | ICD-10-CM

## 2023-08-23 NOTE — Progress Notes (Signed)
THERAPIST PROGRESS NOTE Virtual Visit via Video Note  I connected with Theresa Cantrell on 08/23/23 at 11:00 AM EDT by a video enabled telemedicine application and verified that I am speaking with the correct person using two identifiers.  Location: Patient: home address on file Provider: home office   I discussed the limitations of evaluation and management by telemedicine and the availability of in person appointments. The patient expressed understanding and agreed to proceed.  I discussed the assessment and treatment plan with the patient. The patient was provided an opportunity to ask questions and all were answered. The patient agreed with the plan and demonstrated an understanding of the instructions.   The patient was advised to call back or seek an in-person evaluation if the symptoms worsen or if the condition fails to improve as anticipated.  I provided 40 minutes of non-face-to-face time during this encounter.   Dorris Singh, [facility]   Session Time: 11:03 am ( 40 minutes)  Participation Level: Active  Behavioral Response: CasualAlertNeutral/blunted  Type of Therapy: Individual Therapy  Treatment Goals addressed: STG: Theresa Cantrell will increase management of moods and stressors AEB development of x 3 effective coping skills with ability to process stressors in effective manner per self report within the next 90 days   ProgressTowards Goals: Progressing  Interventions: CBT and Supportive  Summary: Theresa Cantrell is a 47 y.o. female who presents with dx of bipolar disorder and anxiety. Presents for session alert and oriented; mood and affect low; depressed. Tearful. Speech clear and coherent at normal rate and tone. Engaged and receptive to interventions. Shares ongoing low mood and feelings of stress related to ongoing inability to work. Notes to have applied for disability and has lawyer involved. Shares additional stressor of car to have broken down and in need of  new motor after having car for x 1 year. Notes stressor related difficulty paying for bills. Ongoing increasing pain. Explored with therapist ways in which life had changed and working to make adjustments and navigating new ways of doing things. Notes to have followed up on obtaining personal aide to support in doing things around the house. Explored with therapist coping skills for stress and engaging in things in which she enjoys. Notes for depression to be increasing with medications not seeming to be as helpful. Agrees to contact front desk for medication check. Ongoing work towards goals. Denies SIHI Suicidal/Homicidal: Nowithout intent/plan  Therapist Response: Therapist engaged [name] in tele-therapy session. Completed check in and assessed for current level of functioning, sxs management and level of stressors. Explored ability to engage coping skills and ability to navigate stressors. Reviewed resources in community. Supported in processing thoughts in regards to change in mobility and level of functioning. Explored working to explore things in which she can  vs. Things in which she can not. Engaged in guided discovery o explore alternative thoughts and adjusting to new way of life and ongoing meaning to life and things in which she can do. Explored what she has and does not have control over. Encouraged engagement in hobbies and things of interest even if she has to do them differently or taking longer to complete tasks. Educated on need to continue to attend medication management appointments and provided contact number to front desk. Strongly encouraged to contact following appointment. Assessed for safety and provided follow up.    Plan: Return again in  x 6 weeks.  Diagnosis: Bipolar 2 disorder, major depressive episode (HCC)  Anxiety  Collaboration of  Care: Other None  Patient/Guardian was advised Release of Information must be obtained prior to any record release in order to collaborate  their care with an outside provider. Patient/Guardian was advised if they have not already done so to contact the registration department to sign all necessary forms in order for [place] to release information regarding their care.   Consent: Patient/Guardian gives verbal consent for treatment and assignment of benefits for services provided during this visit. Patient/Guardian expressed understanding and agreed to proceed.   Stephan Minister Peck, [facility] 08/23/2023

## 2023-08-26 ENCOUNTER — Other Ambulatory Visit: Payer: Self-pay | Admitting: Internal Medicine

## 2023-08-26 ENCOUNTER — Ambulatory Visit (INDEPENDENT_AMBULATORY_CARE_PROVIDER_SITE_OTHER): Payer: MEDICAID | Admitting: Physician Assistant

## 2023-08-26 DIAGNOSIS — F4310 Post-traumatic stress disorder, unspecified: Secondary | ICD-10-CM | POA: Diagnosis not present

## 2023-08-26 DIAGNOSIS — F419 Anxiety disorder, unspecified: Secondary | ICD-10-CM | POA: Diagnosis not present

## 2023-08-26 DIAGNOSIS — F3181 Bipolar II disorder: Secondary | ICD-10-CM

## 2023-08-26 MED ORDER — HYDROXYZINE HCL 10 MG PO TABS
10.0000 mg | ORAL_TABLET | Freq: Three times a day (TID) | ORAL | 1 refills | Status: DC | PRN
Start: 2023-08-26 — End: 2023-11-11

## 2023-08-26 MED ORDER — PRAZOSIN HCL 1 MG PO CAPS: 1.0000 mg | ORAL_CAPSULE | Freq: Every day | ORAL | 1 refills | Status: DC

## 2023-08-26 MED ORDER — TRAZODONE HCL 100 MG PO TABS
100.0000 mg | ORAL_TABLET | Freq: Every evening | ORAL | 1 refills | Status: DC | PRN
Start: 2023-08-26 — End: 2023-11-11

## 2023-08-26 MED ORDER — FLUOXETINE HCL 20 MG PO CAPS: 20.0000 mg | ORAL_CAPSULE | Freq: Every day | ORAL | 1 refills | Status: DC

## 2023-08-26 MED ORDER — ARIPIPRAZOLE 10 MG PO TABS: 10.0000 mg | ORAL_TABLET | Freq: Every day | ORAL | 1 refills | Status: DC

## 2023-08-26 NOTE — Progress Notes (Signed)
BH MD/PA/NP OP Progress Note  08/27/2023 9:45 AM Theresa Cantrell  MRN:  [id number]  Chief Complaint:  Chief Complaint  Patient presents with   Other    Reestablish care   Medication Management   HPI:   Theresa Cantrell is a 47 year old female with a past psychiatric history significant for bipolar 2 disorder (major depressive episode), PTSD, and anxiety who presents to [facility] to reestablish care and for medication management.  Patient was last seen by this provider on 12/01/2022.  During her previous encounter, patient was being managed on the following psychiatric medications:  Trazodone 50 mg at bedtime Abilify 5 mg daily Fluoxetine 10 mg daily Hydroxyzine 10 mg 3 times daily as needed Prazosin 1 mg at bedtime  Since the last encounter, patient reports that her depression has worsened considerably.  She attributes her worsening depression to being out of work due to her chronic back pain.  Due to her back pain, patient reports that she has trouble doing things around the house as well as trouble keeping up with her bills.  Patient reports that she has been taking her medications regularly and has been getting them prescribed by her primary care provider.  Patient rates her depression an 8 out of 10 with 10 being most severe.  Patient endorses depressive episodes 3-4 times per week with symptoms lasting part of the day.  Patient endorses the following depressive symptoms: feelings of sadness, lack of motivation, decreased concentration, irritability, decreased energy, feelings of guilt/worthlessness, and hopelessness.  She also endorses anxiety and rates her anxiety an 8 out of 10.  Patient endorses the following stressors: chronic back pain and accumulating bills.  Patient is alert and oriented x 4, calm, cooperative, and fully engaged in conversation during the encounter.  Patient describes her mood as irritated.  She reports that she is annoyed  at the pain that she is in.  Patient denies suicidal or homicidal ideations.  She further denies auditory or visual hallucinations and does not appear to be responding to internal/external stimuli.  Patient endorses fair sleep and receives on average 4 to 6 hours of sleep per night.  Patient reports that she sleeps with a CPAP machine.  Patient endorses good appetite and eats on average 3 meals per day.  Patient denies alcohol consumption, tobacco use, or illicit drug use.  Visit Diagnosis:    ICD-10-CM   1. Bipolar 2 disorder, major depressive episode (HCC)  F31.81 ARIPiprazole (ABILIFY) 10 MG tablet    FLUoxetine (PROZAC) 20 MG capsule    traZODone (DESYREL) 100 MG tablet    2. PTSD (post-traumatic stress disorder)  F43.10 prazosin (MINIPRESS) 1 MG capsule    FLUoxetine (PROZAC) 20 MG capsule    3. Anxiety  F41.9 FLUoxetine (PROZAC) 20 MG capsule    hydrOXYzine (ATARAX) 10 MG tablet      Past Psychiatric History:  Disorder (major depressive episode) Anxiety PTSD  Past Medical History:  Past Medical History:  Diagnosis Date   Anxiety    Arthritis    Bipolar 1 disorder (HCC)    Depression    GERD (gastroesophageal reflux disease)    Hypertension    PTSD (post-traumatic stress disorder)     Past Surgical History:  Procedure Laterality Date   ABDOMINAL HYSTERECTOMY     ANTERIOR CERVICAL DECOMP/DISCECTOMY FUSION N/A 06/06/2021   Procedure: C4-5, C5-6 ANTERIOR CERVICAL DISCECTOMY FUSION, ALLOGRAFT, PLATE;  Surgeon: Eldred Manges, MD;  Location: [facility] OR;  Service:  Orthopedics;  Laterality: N/A;   CHOLECYSTECTOMY     DILATION AND CURETTAGE OF UTERUS     FOOT SURGERY Bilateral    two pins in each foot   JOINT REPLACEMENT Left 2018   partial knee replacement   KNEE SURGERY Left 2018   partial knee replacement   NECK SURGERY  06/06/2021   C4-5, C5-6 ANTERIOR CERVICAL DISCECTOMY FUSION, ALLOGRAFT, PLATE    Family Psychiatric History:  Father - bipolar disorder, schizophrenia.  Patient reports that her father was on medications but does not remember what medications he was on Mother - depression, anxiety.  Patient reports that her mother was on medications but does not remember what medications she was on   Family history of suicide: Patient reports that her father attempted multiple times in the past Family history of homicide: Patient denies Family history of substance abuse: Patient reports that her father abused crack cocaine, marijuana, and alcohol  Family History:  Family History  Problem Relation Age of Onset   Hypertension Mother    Cancer Mother        breast   Anxiety disorder Mother    Hypertension Father    Schizophrenia Father    Bipolar disorder Father    Anxiety disorder Father    Colon cancer Neg Hx    Colon polyps Neg Hx    Esophageal cancer Neg Hx    Rectal cancer Neg Hx    Stomach cancer Neg Hx     Social History:  Social History   Socioeconomic History   Marital status: Single    Spouse name: Not on file   Number of children: 1   Years of education: Not on file   Highest education level: Some college, no degree  Occupational History   Not on file  Tobacco Use   Smoking status: Former    Current packs/day: 0.25    Types: Cigarettes   Smokeless tobacco: Never  Vaping Use   Vaping status: Never Used  Substance and Sexual Activity   Alcohol use: Yes    Alcohol/week: 2.0 standard drinks of alcohol    Types: 1 Glasses of wine, 1 Cans of beer per week   Drug use: No   Sexual activity: Not Currently    Birth control/protection: Surgical  Other Topics Concern   Not on file  Social History Narrative   Not on file   Social Determinants of Health   Financial Resource Strain: High Risk (04/02/2023)   Overall Financial Resource Strain (CARDIA)    Difficulty of Paying Living Expenses: Very hard  Food Insecurity: Food Insecurity Present (04/02/2023)   Hunger Vital Sign    Worried About Running Out of Food in the Last Year:  Often true    Ran Out of Food in the Last Year: Often true  Transportation Needs: No Transportation Needs (04/02/2023)   PRAPARE - Administrator, Civil Service (Medical): No    Lack of Transportation (Non-Medical): No  Physical Activity: Sufficiently Active (04/02/2023)   Exercise Vital Sign    Days of Exercise per Week: 5 days    Minutes of Exercise per Session: 150+ min  Stress: Stress Concern Present (04/02/2023)   [employer] of Occupational Health - Occupational Stress Questionnaire    Feeling of Stress : To some extent  Social Connections: Socially Isolated (04/02/2023)   Social Connection and Isolation Panel [NHANES]    Frequency of Communication with Friends and Family: Three times a week    Frequency of  Social Gatherings with Friends and Family: More than three times a week    Attends Religious Services: Never    Database administrator or Organizations: No    Attends Banker Meetings: Never    Marital Status: Never married    Allergies:  Allergies  Allergen Reactions   Amlodipine Swelling    Lower extremity edema.   Hydrochlorothiazide Other (See Comments)    Cough   Lamotrigine Hives    Metabolic Disorder Labs: Lab Results  Component Value Date   HGBA1C 5.4 10/16/2021   No results found for: "PROLACTIN" Lab Results  Component Value Date   CHOL 165 11/07/2020   TRIG 84 11/07/2020   HDL 63 11/07/2020   CHOLHDL 2.6 11/07/2020   LDLCALC 86 11/07/2020   LDLCALC 63 03/31/2018   Lab Results  Component Value Date   TSH 0.711 11/23/2022   TSH 0.484 03/31/2018    Therapeutic Level Labs: No results found for: "LITHIUM" No results found for: "VALPROATE" No results found for: "CBMZ"  Current Medications: Current Outpatient Medications  Medication Sig Dispense Refill   albuterol (VENTOLIN HFA) 108 (90 Base) MCG/ACT inhaler Inhale 2 puffs into the lungs every 6 (six) hours as needed for wheezing or shortness of breath. 8 g 0    ARIPiprazole (ABILIFY) 10 MG tablet Take 1 tablet (10 mg total) by mouth daily. 30 tablet 1   celecoxib (CELEBREX) 200 MG capsule TAKE 1 CAPSULE BY MOUTH TWICE DAILY AS NEEDED BETWEEN  MEALS. 60 capsule 0   Cholecalciferol (VITAMIN D) 50 MCG (2000 UT) tablet Take 2,000 Units by mouth daily.     cyclobenzaprine (FLEXERIL) 5 MG tablet TAKE 1 TABLET BY MOUTH ONCE DAILY AS NEEDED FOR MUSCLE SPASM 30 tablet 1   FLUoxetine (PROZAC) 20 MG capsule Take 1 capsule (20 mg total) by mouth daily. 30 capsule 1   furosemide (LASIX) 20 MG tablet 1 tab PO Q Mon/Wed/Friday 12 tablet 3   hydrOXYzine (ATARAX) 10 MG tablet Take 1 tablet (10 mg total) by mouth 3 (three) times daily as needed. 75 tablet 1   prazosin (MINIPRESS) 1 MG capsule Take 1 capsule (1 mg total) by mouth at bedtime. 30 capsule 1   traZODone (DESYREL) 100 MG tablet Take 1 tablet (100 mg total) by mouth at bedtime as needed for sleep. 30 tablet 1   valsartan (DIOVAN) 80 MG tablet Take 1 tablet (80 mg total) by mouth daily. 90 tablet 1   No current facility-administered medications for this visit.     Musculoskeletal: Strength & Muscle Tone: within normal limits Gait & Station: ataxic, patient ambulates with a cane Patient leans: N/A  Psychiatric Specialty Exam: Review of Systems  Psychiatric/Behavioral:  Positive for dysphoric mood and sleep disturbance. Negative for decreased concentration, hallucinations, self-injury and suicidal ideas. The patient is nervous/anxious. The patient is not hyperactive.     Blood pressure 111/73, pulse (!) 109, height 5\' 5"  (1.651 m), weight 203 lb (92.1 kg), SpO2 100%.Body mass index is 33.78 kg/m.  General Appearance: Casual  Eye Contact:  Good  Speech:  Clear and Coherent and Normal Rate  Volume:  Normal  Mood:  Anxious and Depressed  Affect:  Congruent  Thought Process:  Coherent, Goal Directed, and Descriptions of Associations: Intact  Orientation:  Full (Time, Place, and Person)  Thought Content:  WDL   Suicidal Thoughts:  No  Homicidal Thoughts:  No  Memory:  Immediate;   Good Recent;   Good Remote;   Good  Judgement:  Good  Insight:  Good  Psychomotor Activity:  Normal  Concentration:  Concentration: Good and Attention Span: Good  Recall:  Good  Fund of Knowledge: Good  Language: Good  Akathisia:  No  Handed:  Right  AIMS (if indicated): not done  Assets:  Communication Skills Desire for Improvement Financial Resources/Insurance Housing Social Support Transportation Vocational/Educational  ADL's:  Intact  Cognition: WNL  Sleep:  Fair   Screenings: AUDIT    Flowsheet Row Office Visit from 05/17/2023 in [facility] Most recent reading at 04/02/2023  8:48 AM Appointment from 04/06/2023 in [facility] Most recent reading at 04/02/2023  8:48 AM  Alcohol Use Disorder Identification Test Final Score (AUDIT) 3 3      GAD-7    Flowsheet Row Clinical Support from 08/26/2023 in [facility] Office Visit from 05/17/2023 in [facility] Counselor from 04/15/2023 in [facility] Counselor from 02/02/2023 in [facility] Counselor from 12/29/2022 in [facility]  Total GAD-7 Score 20 19 9 12 13       PHQ2-9    Flowsheet Row Clinical Support from 08/26/2023 in [facility] Office Visit from 05/17/2023 in [facility] Counselor from 04/15/2023 in [facility] Counselor from 02/02/2023 in [facility] Counselor from 12/29/2022 in [facility]  PHQ-2 Total Score 4 6 1 1 1   PHQ-9 Total Score 18 15 3 3 8       Flowsheet Row Clinical Support from 08/26/2023 in [facility] ED from 03/28/2023 in [facility]  Emergency Department at [facility] Office Visit from 12/01/2022 in [facility]  C-SSRS RISK CATEGORY Low Risk No Risk No Risk        Assessment and Plan:   Leith Maxie. Saks is a 47 year old female with a past psychiatric history significant for bipolar 2 disorder (major depressive episode), PTSD, and anxiety who presents to [facility] to reestablish care and for medication management.  Patient was last seen by this provider on 12/01/2022.  Since being away from this practice, patient reports that she has been getting her medications refilled by her primary care provider.  Patient presents today stating that she has been experiencing worsening depression and anxiety.  She attributes her ongoing depression to her chronic back pain.  Due to her chronic back pain, patient reports that she has been out of work for some time.  Patient attributes her anxiety to her accumulating bills.  Patient reports that she continues to take her medications regularly.  She reports that her medications were more effective in managing her symptoms in the past before dealing with her chronic back pain and being out of work.  Provider recommended increasing her Abilify from 5 mg to 10 mg daily for mood stability.  Provider also recommended increasing patient's fluoxetine from 10 mg to 20 mg daily for the management of her depressive symptoms and anxiety.  Lastly, provider recommended increasing patient's trazodone from 50 mg to 100 mg at bedtime for the management of her sleep.  Patient was agreeable to recommendations.  Patient's medications to be e-prescribed through pharmacy of choice.  Collaboration of Care: Collaboration of Care: Medication Management AEB provider managing patient's psychiatric medications, Primary Care Provider AEB patient  being seen by internal medicine, Psychiatrist AEB patient being seen by a mental health provider at this  facility, and Referral or follow-up with counselor/therapist AEB patient being seen by a licensed clinical social worker at this facility  Patient/Guardian was advised Release of Information must be obtained prior to any record release in order to collaborate their care with an outside provider. Patient/Guardian was advised if they have not already done so to contact the registration department to sign all necessary forms in order for [place] to release information regarding their care.   Consent: Patient/Guardian gives verbal consent for treatment and assignment of benefits for services provided during this visit. Patient/Guardian expressed understanding and agreed to proceed.   1. Bipolar 2 disorder, major depressive episode (HCC)  - ARIPiprazole (ABILIFY) 10 MG tablet; Take 1 tablet (10 mg total) by mouth daily.  Dispense: 30 tablet; Refill: 1 - FLUoxetine (PROZAC) 20 MG capsule; Take 1 capsule (20 mg total) by mouth daily.  Dispense: 30 capsule; Refill: 1 - traZODone (DESYREL) 100 MG tablet; Take 1 tablet (100 mg total) by mouth at bedtime as needed for sleep.  Dispense: 30 tablet; Refill: 1  2. PTSD (post-traumatic stress disorder)  - prazosin (MINIPRESS) 1 MG capsule; Take 1 capsule (1 mg total) by mouth at bedtime.  Dispense: 30 capsule; Refill: 1 - FLUoxetine (PROZAC) 20 MG capsule; Take 1 capsule (20 mg total) by mouth daily.  Dispense: 30 capsule; Refill: 1  3. Anxiety  - FLUoxetine (PROZAC) 20 MG capsule; Take 1 capsule (20 mg total) by mouth daily.  Dispense: 30 capsule; Refill: 1 - hydrOXYzine (ATARAX) 10 MG tablet; Take 1 tablet (10 mg total) by mouth 3 (three) times daily as needed.  Dispense: 75 tablet; Refill: 1  Patient to follow up in 6 weeks Provider spent a total of 20 minutes with the patient/reviewing patient's chart  Meta Hatchet, PA 08/27/2023, 9:45 AM

## 2023-08-27 ENCOUNTER — Encounter (HOSPITAL_COMMUNITY): Payer: Self-pay | Admitting: Physician Assistant

## 2023-08-27 MED ORDER — VITAMIN D 50 MCG (2000 UT) PO TABS
2000.0000 [IU] | ORAL_TABLET | Freq: Every day | ORAL | 0 refills | Status: DC
  Filled 2023-08-27 – 2023-09-09 (×4): qty 60, 60d supply, fill #0

## 2023-08-30 ENCOUNTER — Other Ambulatory Visit (HOSPITAL_COMMUNITY): Payer: Self-pay

## 2023-08-31 ENCOUNTER — Telehealth (HOSPITAL_COMMUNITY): Payer: Self-pay | Admitting: *Deleted

## 2023-08-31 ENCOUNTER — Other Ambulatory Visit (HOSPITAL_COMMUNITY): Payer: Self-pay

## 2023-08-31 NOTE — Telephone Encounter (Signed)
Fax received for prior authorization of Aripiprazole 10mg . Submitted online with cover my meds. Awaiting decision.

## 2023-08-31 NOTE — Telephone Encounter (Signed)
Checked online with cover my meds. Aripiprazole 10mg  has been approved until 08/30/24. Called to notify pharmacy.

## 2023-09-01 ENCOUNTER — Other Ambulatory Visit (HOSPITAL_COMMUNITY): Payer: Self-pay

## 2023-09-01 NOTE — Telephone Encounter (Signed)
Message acknowledged and reviewed.

## 2023-09-06 ENCOUNTER — Other Ambulatory Visit: Payer: Self-pay | Admitting: Internal Medicine

## 2023-09-06 DIAGNOSIS — I10 Essential (primary) hypertension: Secondary | ICD-10-CM

## 2023-09-07 NOTE — Telephone Encounter (Signed)
Requested Prescriptions  Pending Prescriptions Disp Refills   furosemide (LASIX) 20 MG tablet [Pharmacy Med Name: Furosemide 20 MG Oral Tablet] 12 tablet 2    Sig: TAKE 1 TABLET BY MOUTH ON MONDAY, WEDNESDAY AND FRIDAY     Cardiovascular:  Diuretics - Loop Failed - 09/06/2023  1:24 PM      Failed - Mg Level in normal range and within 180 days    No results found for: "MG"       Passed - K in normal range and within 180 days    Potassium  Date Value Ref Range Status  05/24/2023 5.2 3.5 - 5.2 mmol/L Final         Passed - Ca in normal range and within 180 days    Calcium  Date Value Ref Range Status  05/24/2023 9.2 8.7 - 10.2 mg/dL Final         Passed - Na in normal range and within 180 days    Sodium  Date Value Ref Range Status  05/24/2023 141 134 - 144 mmol/L Final         Passed - Cr in normal range and within 180 days    Creat  Date Value Ref Range Status  10/01/2021 1.12 (H) 0.50 - 0.99 mg/dL Final   Creatinine, Ser  Date Value Ref Range Status  05/24/2023 0.76 0.57 - 1.00 mg/dL Final         Passed - Cl in normal range and within 180 days    Chloride  Date Value Ref Range Status  05/24/2023 101 96 - 106 mmol/L Final         Passed - Last BP in normal range    BP Readings from Last 1 Encounters:  06/24/23 124/81         Passed - Valid encounter within last 6 months    Recent Outpatient Visits           2 months ago Essential hypertension   [facility] [place]. [facility] Lois Huxley, [place] L, RPH-CPP   2 months ago Chronic low back pain, unspecified back pain laterality, unspecified whether sciatica present   [facility] [place]. [facility] Marcine Matar, MD   3 months ago Essential hypertension   [facility] Dept Of [place]. [facility] Marcine Matar, MD   9 months ago Throat discomfort   [facility]  - A Dept Of [place]. [facility] Marcine Matar, MD   1 year ago Essential hypertension   [facility] [place] - A Dept Of . [facility] Marcine Matar, MD       Future Appointments             In 1 week Marcine Matar, MD [facility] [place] - A Dept Of Eligha Bridegroom. [facility]

## 2023-09-08 ENCOUNTER — Other Ambulatory Visit (HOSPITAL_COMMUNITY): Payer: Self-pay

## 2023-09-09 ENCOUNTER — Other Ambulatory Visit (HOSPITAL_COMMUNITY): Payer: Self-pay

## 2023-09-17 ENCOUNTER — Encounter: Payer: Self-pay | Admitting: Internal Medicine

## 2023-09-17 ENCOUNTER — Ambulatory Visit: Payer: MEDICAID | Attending: Internal Medicine | Admitting: Internal Medicine

## 2023-09-17 VITALS — BP 143/93 | HR 79 | Temp 97.9°F | Ht 65.0 in | Wt 215.0 lb

## 2023-09-17 DIAGNOSIS — M545 Low back pain, unspecified: Secondary | ICD-10-CM

## 2023-09-17 DIAGNOSIS — I10 Essential (primary) hypertension: Secondary | ICD-10-CM

## 2023-09-17 DIAGNOSIS — R7303 Prediabetes: Secondary | ICD-10-CM

## 2023-09-17 DIAGNOSIS — Z6835 Body mass index (BMI) 35.0-35.9, adult: Secondary | ICD-10-CM

## 2023-09-17 DIAGNOSIS — Z2821 Immunization not carried out because of patient refusal: Secondary | ICD-10-CM

## 2023-09-17 DIAGNOSIS — G8929 Other chronic pain: Secondary | ICD-10-CM

## 2023-09-17 DIAGNOSIS — E66812 Obesity, class 2: Secondary | ICD-10-CM

## 2023-09-17 MED ORDER — CHLORTHALIDONE 25 MG PO TABS
25.0000 mg | ORAL_TABLET | Freq: Every day | ORAL | 1 refills | Status: DC
Start: 2023-09-17 — End: 2023-09-22

## 2023-09-17 MED ORDER — WEGOVY 0.25 MG/0.5ML ~~LOC~~ SOAJ
0.2500 mg | SUBCUTANEOUS | 1 refills | Status: DC
Start: 2023-09-17 — End: 2023-09-20

## 2023-09-17 NOTE — Patient Instructions (Signed)
To the nutritionist as discussed. Consider joining the [employer] to do water aerobics several days a week.  It will help with some of your chronic back pain. Start with Wegovy 0.25 mg injection once a week.  If you develop any vomiting, abdominal pain, unable to pass your bowel, severe diarrhea or severe constipation on this medication, please stop it and come in to be seen.  Your blood pressure is not at goal.  We have added another medication called chlorthalidone 25 mg daily.

## 2023-09-17 NOTE — Progress Notes (Signed)
Patient ID: Theresa Cantrell, female    DOB: 14-Jun-1976  MRN: [id number]  CC: Hypertension (HTN f/u. Denton Meek about unintentional weight gain - appetite increase/Discuss flexeril - reports not helping as much as before /No to flu vax.)   Subjective: Theresa Cantrell is a 47 y.o. female who presents for chronic ds management. Her concerns today include:  Patient with history of HTN, former tob dep, GERD, obesity,  PTSD, Bipolar 2,  CTS RT hand, family history of breast cancer in her mother.   HTN:  taking and tolerating Diovan 80 mg daily. Took already this a.m Has device but has not checked in past wk.  Does not recall last reading.  Limits salt in foods.  Concern about wgh gain. Prior hx of preDM Gained 27 lbs since July.  Eats 3 full meals a day and snacks on cookies/Debbie cakes at nights.  Drinks water and juice.   Currently working and not doing a whole lot due to back issues.  Over eating and laying around.  Emotional eater.   Back getting worse with winter setting in. Takes Flexeril at nights.  Feels it is not helping as much as before.   Seen at [facility] for pain management.  Started on Gabapentin 300 mg TID last wk and Diclofenac; OTC Tylenol  Patient Active Problem List   Diagnosis Date Noted   Protrusion of lumbar intervertebral disc 03/24/2023   Depression with anxiety 11/30/2022   Partial thickness rotator cuff tear 11/16/2022   Adjustment disorder with mixed anxiety and depressed mood 02/11/2022   Rotator cuff tendinitis, right 11/14/2021   S/P cervical spinal fusion 06/17/2021   Spinal stenosis of cervical region 04/02/2021   COVID-19 vaccine series not completed 12/20/2020   Influenza vaccine refused 11/07/2020   Essential hypertension 11/07/2020   Obesity (BMI 30.0-34.9) 11/07/2020   Tobacco dependence 11/07/2020   Gastroesophageal reflux disease without esophagitis 11/07/2020   History of abnormal cervical Pap smear 11/07/2020   History of bipolar disorder  11/07/2020   Bipolar 2 disorder, major depressive episode (HCC) 06/12/2020   PTSD (post-traumatic stress disorder) 06/12/2020   Chronic pain of left knee 10/05/2019   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   Neuropathy 02/07/2014   Hot flashes 02/07/2014   Weight loss 02/07/2014   Anxiety      Current Outpatient Medications on File Prior to Visit  Medication Sig Dispense Refill   ARIPiprazole (ABILIFY) 10 MG tablet Take 1 tablet (10 mg total) by mouth daily. 30 tablet 1   Cholecalciferol (VITAMIN D) 50 MCG (2000 UT) tablet Take 1 tablet (2,000 Units total) by mouth daily. 60 tablet 0   cyclobenzaprine (FLEXERIL) 5 MG tablet TAKE 1 TABLET BY MOUTH ONCE DAILY AS NEEDED FOR MUSCLE SPASM 30 tablet 1   diclofenac (VOLTAREN) 75 MG EC tablet Take 75 mg by mouth 2 (two) times daily as needed.     FLUoxetine (PROZAC) 20 MG capsule Take 1 capsule (20 mg total) by mouth daily. 30 capsule 1   furosemide (LASIX) 20 MG tablet TAKE 1 TABLET BY MOUTH ON MONDAY, WEDNESDAY AND FRIDAY 12 tablet 2   gabapentin (NEURONTIN) 300 MG capsule Take 300 mg by mouth 3 (three) times daily.     hydrOXYzine (ATARAX) 10 MG tablet Take 1 tablet (10 mg total) by mouth 3 (three) times daily as needed. 75 tablet 1   prazosin (MINIPRESS) 1 MG capsule Take 1 capsule (1 mg total) by mouth at bedtime. 30 capsule 1  traZODone (DESYREL) 100 MG tablet Take 1 tablet (100 mg total) by mouth at bedtime as needed for sleep. 30 tablet 1   valsartan (DIOVAN) 80 MG tablet Take 1 tablet (80 mg total) by mouth daily. 90 tablet 1   albuterol (VENTOLIN HFA) 108 (90 Base) MCG/ACT inhaler Inhale 2 puffs into the lungs every 6 (six) hours as needed for wheezing or shortness of breath. (Patient not taking: Reported on 09/17/2023) 8 g 0   No current facility-administered medications on file prior to visit.    Allergies  Allergen Reactions   Amlodipine Swelling    Lower extremity edema.   Hydrochlorothiazide Other (See  Comments)    Cough   Lamotrigine Hives    Social History   Socioeconomic History   Marital status: Single    Spouse name: Not on file   Number of children: 1   Years of education: Not on file   Highest education level: Some college, no degree  Occupational History   Not on file  Tobacco Use   Smoking status: Former    Current packs/day: 0.25    Types: Cigarettes   Smokeless tobacco: Never  Vaping Use   Vaping status: Never Used  Substance and Sexual Activity   Alcohol use: Yes    Alcohol/week: 2.0 standard drinks of alcohol    Types: 1 Glasses of wine, 1 Cans of beer per week   Drug use: No   Sexual activity: Not Currently    Birth control/protection: Surgical  Other Topics Concern   Not on file  Social History Narrative   Not on file   Social Determinants of Health   Financial Resource Strain: High Risk (04/02/2023)   Overall Financial Resource Strain (CARDIA)    Difficulty of Paying Living Expenses: Very hard  Food Insecurity: Food Insecurity Present (04/02/2023)   Hunger Vital Sign    Worried About Running Out of Food in the Last Year: Often true    Ran Out of Food in the Last Year: Often true  Transportation Needs: No Transportation Needs (04/02/2023)   PRAPARE - Administrator, Civil Service (Medical): No    Lack of Transportation (Non-Medical): No  Physical Activity: Sufficiently Active (04/02/2023)   Exercise Vital Sign    Days of Exercise per Week: 5 days    Minutes of Exercise per Session: 150+ min  Stress: Stress Concern Present (04/02/2023)   [employer] of Occupational Health - Occupational Stress Questionnaire    Feeling of Stress : To some extent  Social Connections: Socially Isolated (04/02/2023)   Social Connection and Isolation Panel [NHANES]    Frequency of Communication with Friends and Family: Three times a week    Frequency of Social Gatherings with Friends and Family: More than three times a week    Attends Religious Services:  Never    Database administrator or Organizations: No    Attends Banker Meetings: Never    Marital Status: Never married  Intimate Partner Violence: Not At Risk (11/30/2022)   Humiliation, Afraid, Rape, and Kick questionnaire    Fear of Current or Ex-Partner: No    Emotionally Abused: No    Physically Abused: No    Sexually Abused: No    Family History  Problem Relation Age of Onset   Hypertension Mother    Cancer Mother        breast   Anxiety disorder Mother    Hypertension Father    Schizophrenia Father  Bipolar disorder Father    Anxiety disorder Father    Colon cancer Neg Hx    Colon polyps Neg Hx    Esophageal cancer Neg Hx    Rectal cancer Neg Hx    Stomach cancer Neg Hx     Past Surgical History:  Procedure Laterality Date   ABDOMINAL HYSTERECTOMY     ANTERIOR CERVICAL DECOMP/DISCECTOMY FUSION N/A 06/06/2021   Procedure: C4-5, C5-6 ANTERIOR CERVICAL DISCECTOMY FUSION, ALLOGRAFT, PLATE;  Surgeon: Eldred Manges, MD;  Location: [facility] OR;  Service: Orthopedics;  Laterality: N/A;   CHOLECYSTECTOMY     DILATION AND CURETTAGE OF UTERUS     FOOT SURGERY Bilateral    two pins in each foot   JOINT REPLACEMENT Left 2018   partial knee replacement   KNEE SURGERY Left 2018   partial knee replacement   NECK SURGERY  06/06/2021   C4-5, C5-6 ANTERIOR CERVICAL DISCECTOMY FUSION, ALLOGRAFT, PLATE    ROS: Review of Systems Negative except as stated above  PHYSICAL EXAM: BP (!) 143/93   Pulse 79   Temp 97.9 F (36.6 C) (Oral)   Ht 5\' 5"  (1.651 m)   Wt 215 lb (97.5 kg)   SpO2 98%   BMI 35.78 kg/m   Wt Readings from Last 3 Encounters:  09/17/23 215 lb (97.5 kg)  06/04/23 188 lb (85.3 kg)  05/17/23 188 lb (85.3 kg)    Physical Exam  General appearance - alert, well appearing, and in no distress Mental status - normal mood, behavior, speech, dress, motor activity, and thought processes Neck - supple, no significant adenopathy Chest - clear to  auscultation, no wheezes, rales or rhonchi, symmetric air entry Heart - normal rate, regular rhythm, normal S1, S2, no murmurs, rubs, clicks or gallops Extremities - peripheral pulses normal, no pedal edema, no clubbing or cyanosis MSK:  has cane with her     Latest Ref Rng & Units 05/24/2023    8:42 AM 03/28/2023   12:45 PM 04/21/2022    3:42 PM  CMP  Glucose 70 - 99 mg/dL 92  95  82   BUN 6 - 24 mg/dL 22  13  17    Creatinine 0.57 - 1.00 mg/dL 1.61  0.96  0.45   Sodium 134 - 144 mmol/L 141  140  138   Potassium 3.5 - 5.2 mmol/L 5.2  4.9  4.5   Chloride 96 - 106 mmol/L 101  102  103   CO2 20 - 29 mmol/L 25  27  26    Calcium 8.7 - 10.2 mg/dL 9.2  9.6  9.1   Total Protein 6.5 - 8.1 g/dL  6.8  6.9   Total Bilirubin 0.3 - 1.2 mg/dL  0.8  0.2   Alkaline Phos 38 - 126 U/L  88  94   AST 15 - 41 U/L  21  16   ALT 0 - 44 U/L  16  11    Lipid Panel     Component Value Date/Time   CHOL 165 11/07/2020 1610   TRIG 84 11/07/2020 1610   HDL 63 11/07/2020 1610   CHOLHDL 2.6 11/07/2020 1610   LDLCALC 86 11/07/2020 1610    CBC    Component Value Date/Time   WBC 5.4 03/28/2023 1245   RBC 4.50 03/28/2023 1245   HGB 13.9 03/28/2023 1245   HGB 13.3 11/23/2022 1556   HCT 42.5 03/28/2023 1245   HCT 40.6 11/23/2022 1556   PLT 234 03/28/2023 1245   PLT  268 11/23/2022 1556   MCV 94.4 03/28/2023 1245   MCV 88 11/23/2022 1556   MCH 30.9 03/28/2023 1245   MCHC 32.7 03/28/2023 1245   RDW 12.8 03/28/2023 1245   RDW 13.2 11/23/2022 1556   LYMPHSABS 1.5 03/28/2023 1245   MONOABS 0.4 03/28/2023 1245   EOSABS 0.1 03/28/2023 1245   BASOSABS 0.0 03/28/2023 1245   The 10-year ASCVD risk score (Arnett DK, et al., 2019) is: 3.5%   Values used to calculate the score:     Age: 71 years     Sex: Female     Is Non-Hispanic African American: Yes     Diabetic: No     Tobacco smoker: No     Systolic Blood Pressure: 143 mmHg     Is BP treated: Yes     HDL Cholesterol: 63 mg/dL     Total Cholesterol:  165 mg/dL  ASSESSMENT AND PLAN: 1. Essential hypertension Not at goal.  Continue Diovan 80 mg daily.  Add chlorthalidone 25 mg daily.  Was on HCTZ in the past but it was stopped due to cough.  Encouraged her to check blood pressure at least once a week with goal being 130/80 or lower. - Lipid panel - Comprehensive metabolic panel  2. Class 2 severe obesity due to excess calories with serious comorbidity and body mass index (BMI) of 35.0 to 35.9 in adult [facility]) Patient advised to eliminate sugary drinks from the diet, cut back on portion sizes especially of white carbohydrates, eat more white lean meat like chicken [place] and seafood instead of beef or pork and incorporate fresh fruits and vegetables into the diet daily. She is agreeable to seeing a nutritionist. We discussed trying her with Wegovy to help with weight loss.  She does not have any personal or family history of thyroid cancer.  No prior history of pancreatitis.  Went over with her how the medication works and possible side effects including pancreatitis, bowel blockage, nausea/vomiting, severe diarrhea/constipation.  She is wanting to give the medication a try.  We will start at the lowest dose of 0.25 mg once a week.  Advised if she develops any vomiting, abdominal pain, severe diarrhea or constipation, she should stop the medicine and be seen.  Clinical pharmacist met with her today to be taught administration - Lipid panel - Semaglutide-Weight Management (WEGOVY) 0.25 MG/0.5ML SOAJ; Inject 0.25 mg into the skin once a week.  Dispense: 2 mL; Refill: 1 - Amb ref to Medical Nutrition Therapy-MNT - Hemoglobin A1c  3. Prediabetes See #2 above. - Hemoglobin A1c  4. Chronic low back pain, unspecified back pain laterality, unspecified whether sciatica present She is plugged in with pain management. Encouraged her to consider going to the [facility] for water aerobics.   Patient was given the opportunity to ask questions.  Patient  verbalized understanding of the plan and was able to repeat key elements of the plan.   This documentation was completed using Paediatric nurse.  Any transcriptional errors are unintentional.  Orders Placed This Encounter  Procedures   Lipid panel   Comprehensive metabolic panel   Hemoglobin A1c   Amb ref to Medical Nutrition Therapy-[facility]     Requested Prescriptions   Signed Prescriptions Disp Refills   Semaglutide-Weight Management (WEGOVY) 0.25 MG/0.5ML SOAJ 2 mL 1    Sig: Inject 0.25 mg into the skin once a week.   chlorthalidone (HYGROTON) 25 MG tablet 90 tablet 1    Sig: Take 1 tablet (25 mg  total) by mouth daily.    Return in about 2 months (around 11/17/2023).  Jonah Blue, MD, FACP

## 2023-09-18 ENCOUNTER — Other Ambulatory Visit (HOSPITAL_BASED_OUTPATIENT_CLINIC_OR_DEPARTMENT_OTHER): Payer: Self-pay

## 2023-09-18 LAB — COMPREHENSIVE METABOLIC PANEL
ALT: 17 [IU]/L (ref 0–32)
AST: 22 [IU]/L (ref 0–40)
Albumin: 3.9 g/dL (ref 3.9–4.9)
Alkaline Phosphatase: 77 [IU]/L (ref 44–121)
BUN/Creatinine Ratio: 25 — ABNORMAL HIGH (ref 9–23)
BUN: 17 mg/dL (ref 6–24)
Bilirubin Total: <0.2 mg/dL (ref 0.0–1.2)
CO2: 24 mmol/L (ref 20–29)
Calcium: 8.8 mg/dL (ref 8.7–10.2)
Chloride: 109 mmol/L — ABNORMAL HIGH (ref 96–106)
Creatinine, Ser: 0.69 mg/dL (ref 0.57–1.00)
Globulin, Total: 1.6 g/dL (ref 1.5–4.5)
Glucose: 103 mg/dL — ABNORMAL HIGH (ref 70–99)
Potassium: 5.1 mmol/L (ref 3.5–5.2)
Sodium: 145 mmol/L — ABNORMAL HIGH (ref 134–144)
Total Protein: 5.5 g/dL — ABNORMAL LOW (ref 6.0–8.5)
eGFR: 106 mL/min/{1.73_m2} (ref >59)

## 2023-09-18 LAB — LIPID PANEL
Chol/HDL Ratio: 2.6 ratio (ref 0.0–4.4)
Cholesterol, Total: 187 mg/dL (ref 100–199)
HDL: 71 mg/dL (ref >39)
LDL Chol Calc (NIH): 97 mg/dL (ref 0–99)
Triglycerides: 105 mg/dL (ref 0–149)
VLDL Cholesterol Cal: 19 mg/dL (ref 5–40)

## 2023-09-18 LAB — HEMOGLOBIN A1C
Est. average glucose Bld gHb Est-mCnc: 123 mg/dL
Hgb A1c MFr Bld: 5.9 % — ABNORMAL HIGH (ref 4.8–5.6)

## 2023-09-20 ENCOUNTER — Other Ambulatory Visit: Payer: Self-pay | Admitting: Pharmacist

## 2023-09-20 ENCOUNTER — Other Ambulatory Visit: Payer: Self-pay

## 2023-09-20 ENCOUNTER — Telehealth: Payer: Self-pay | Admitting: Internal Medicine

## 2023-09-20 DIAGNOSIS — E66812 Obesity, class 2: Secondary | ICD-10-CM

## 2023-09-20 MED ORDER — WEGOVY 0.25 MG/0.5ML ~~LOC~~ SOAJ
0.2500 mg | SUBCUTANEOUS | 1 refills | Status: DC
Start: 2023-09-20 — End: 2023-11-10
  Filled 2023-09-20: qty 2, 28d supply, fill #0
  Filled 2023-10-11: qty 2, 28d supply, fill #1

## 2023-09-20 NOTE — Telephone Encounter (Signed)
Pt is calling to report that medication is out of stock at most pharmacies in [place]. Semaglutide-Weight Management (WEGOVY) 0.25 MG/0.5ML SOAJ [ [id number]]   Pt would like to know should her medication be changed?  Please advise Cb- [phone]

## 2023-09-22 ENCOUNTER — Telehealth: Payer: Self-pay | Admitting: Internal Medicine

## 2023-09-22 ENCOUNTER — Other Ambulatory Visit: Payer: Self-pay

## 2023-09-22 ENCOUNTER — Telehealth: Payer: MEDICAID | Admitting: Physician Assistant

## 2023-09-22 DIAGNOSIS — I10 Essential (primary) hypertension: Secondary | ICD-10-CM | POA: Diagnosis not present

## 2023-09-22 DIAGNOSIS — T7840XA Allergy, unspecified, initial encounter: Secondary | ICD-10-CM | POA: Diagnosis not present

## 2023-09-22 DIAGNOSIS — L282 Other prurigo: Secondary | ICD-10-CM | POA: Diagnosis not present

## 2023-09-22 MED ORDER — NIFEDIPINE ER OSMOTIC RELEASE 30 MG PO TB24
30.0000 mg | ORAL_TABLET | Freq: Every day | ORAL | 0 refills | Status: DC
Start: 2023-09-22 — End: 2023-10-16
  Filled 2023-09-22: qty 30, 30d supply, fill #0

## 2023-09-22 NOTE — Progress Notes (Signed)
Virtual Visit Consent   Theresa Cantrell, you are scheduled for a virtual visit with a [facility] provider today. Just as with appointments in the office, your consent must be obtained to participate. Your consent will be active for this visit and any virtual visit you may have with one of our providers in the next 365 days. If you have a MyChart account, a copy of this consent can be sent to you electronically.  As this is a virtual visit, video technology does not allow for your provider to perform a traditional examination. This may limit your provider's ability to fully assess your condition. If your provider identifies any concerns that need to be evaluated in person or the need to arrange testing (such as labs, EKG, etc.), we will make arrangements to do so. Although advances in technology are sophisticated, we cannot ensure that it will always work on either your end or our end. If the connection with a video visit is poor, the visit may have to be switched to a telephone visit. With either a video or telephone visit, we are not always able to ensure that we have a secure connection.  By engaging in this virtual visit, you consent to the provision of healthcare and authorize for your insurance to be billed (if applicable) for the services provided during this visit. Depending on your insurance coverage, you may receive a charge related to this service.  I need to obtain your verbal consent now. Are you willing to proceed with your visit today? Theresa Cantrell has provided verbal consent on 09/22/2023 for a virtual visit (video or telephone). [name], PA-C  Date: 09/22/2023 11:06 AM  Virtual Visit via Video Note   I, [name], connected with  ELLIANNA Cantrell  ([id number], 1976-07-12) on 09/22/23 at  7:45 AM EST by a video-enabled telemedicine application and verified that I am speaking with the correct person using two identifiers.  Location: Patient: Virtual Visit  Location Patient: Home Provider: Virtual Visit Location Provider: Home Office   I discussed the limitations of evaluation and management by telemedicine and the availability of in person appointments. The patient expressed understanding and agreed to proceed.    History of Present Illness: Theresa Cantrell is a 47 y.o. who identifies as a female who was assigned female at birth, and is being seen today for rash and cramping in her legs. She was recently started on Wegovy and Chlorthalidone on 09/17/23. Started both on 09/20/23. She gave her Wegovy shot in her abdomen, not where rash is located. She was put on Chlorthalidone from Amlodipine due to lower extremity edema from Amlodipine. She was placed on Furosemide and has been taking that as well. She takes Valsartan 80mg  and Prazosin 1mg  nightly for her HTN. The rash was itching and she has been able to take Benadryl with some relief. She denies any facial edema, tongue/mouth/lips/throat swelling. No difficulties breathing or swallowing. She has had a past allergy to HCTZ but was GI related.     Problems:  Patient Active Problem List   Diagnosis Date Noted   Protrusion of lumbar intervertebral disc 03/24/2023   Depression with anxiety 11/30/2022   Partial thickness rotator cuff tear 11/16/2022   Adjustment disorder with mixed anxiety and depressed mood 02/11/2022   Rotator cuff tendinitis, right 11/14/2021   S/P cervical spinal fusion 06/17/2021   Spinal stenosis of cervical region 04/02/2021   COVID-19 vaccine series not completed 12/20/2020   Influenza vaccine refused  11/07/2020   Essential hypertension 11/07/2020   Obesity (BMI 30.0-34.9) 11/07/2020   Tobacco dependence 11/07/2020   Gastroesophageal reflux disease without esophagitis 11/07/2020   History of abnormal cervical Pap smear 11/07/2020   History of bipolar disorder 11/07/2020   Bipolar 2 disorder, major depressive episode (HCC) 06/12/2020   PTSD (post-traumatic stress  disorder) 06/12/2020   Chronic pain of left knee 10/05/2019   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   Neuropathy 02/07/2014   Hot flashes 02/07/2014   Weight loss 02/07/2014   Anxiety     Allergies:  Allergies  Allergen Reactions   Amlodipine Swelling    Lower extremity edema.   Hydrochlorothiazide Other (See Comments)    Cough   Lamotrigine Hives   Chlorthalidone Rash and Other (See Comments)    Cramping   Medications:  Current Outpatient Medications:    NIFEdipine (PROCARDIA-XL/NIFEDICAL-XL) 30 MG 24 hr tablet, Take 1 tablet (30 mg total) by mouth daily., Disp: 30 tablet, Rfl: 0   albuterol (VENTOLIN HFA) 108 (90 Base) MCG/ACT inhaler, Inhale 2 puffs into the lungs every 6 (six) hours as needed for wheezing or shortness of breath. (Patient not taking: Reported on 09/17/2023), Disp: 8 g, Rfl: 0   ARIPiprazole (ABILIFY) 10 MG tablet, Take 1 tablet (10 mg total) by mouth daily., Disp: 30 tablet, Rfl: 1   Cholecalciferol (VITAMIN D) 50 MCG (2000 UT) tablet, Take 1 tablet (2,000 Units total) by mouth daily., Disp: 60 tablet, Rfl: 0   cyclobenzaprine (FLEXERIL) 5 MG tablet, TAKE 1 TABLET BY MOUTH ONCE DAILY AS NEEDED FOR MUSCLE SPASM, Disp: 30 tablet, Rfl: 1   diclofenac (VOLTAREN) 75 MG EC tablet, Take 75 mg by mouth 2 (two) times daily as needed., Disp: , Rfl:    FLUoxetine (PROZAC) 20 MG capsule, Take 1 capsule (20 mg total) by mouth daily., Disp: 30 capsule, Rfl: 1   furosemide (LASIX) 20 MG tablet, TAKE 1 TABLET BY MOUTH ON MONDAY, WEDNESDAY AND FRIDAY, Disp: 12 tablet, Rfl: 2   gabapentin (NEURONTIN) 300 MG capsule, Take 300 mg by mouth 3 (three) times daily., Disp: , Rfl:    hydrOXYzine (ATARAX) 10 MG tablet, Take 1 tablet (10 mg total) by mouth 3 (three) times daily as needed., Disp: 75 tablet, Rfl: 1   prazosin (MINIPRESS) 1 MG capsule, Take 1 capsule (1 mg total) by mouth at bedtime., Disp: 30 capsule, Rfl: 1   Semaglutide-Weight Management (WEGOVY) 0.25  MG/0.5ML SOAJ, Inject 0.25 mg into the skin once a week., Disp: 2 mL, Rfl: 1   traZODone (DESYREL) 100 MG tablet, Take 1 tablet (100 mg total) by mouth at bedtime as needed for sleep., Disp: 30 tablet, Rfl: 1   valsartan (DIOVAN) 80 MG tablet, Take 1 tablet (80 mg total) by mouth daily., Disp: 90 tablet, Rfl: 1  Observations/Objective: Patient is well-developed, well-nourished in no acute distress.  Resting comfortably at home.  Head is normocephalic, atraumatic.  No labored breathing.  Speech is clear and coherent with logical content.  Patient is alert and oriented at baseline.    Assessment and Plan: 1. Primary hypertension - NIFEdipine (PROCARDIA-XL/NIFEDICAL-XL) 30 MG 24 hr tablet; Take 1 tablet (30 mg total) by mouth daily.  Dispense: 30 tablet; Refill: 0  2. Allergic reaction to drug, initial encounter  3. Pruritic rash  - Suspect Chlorthalidone allergy and possible electrolyte deficiency with taking the combination of Furosemide and Chlorthalidone over the last 4 days.  - Advised to STOP Chlorthalidone - Added Nifedipine low  dose 30mg  XR to take for BP - Continue Valsartan and Prazosin - Can continue Furosemide as needed for edema - Take Benadryl and Pepcid (famotidine) for rash and itching as needed - Cold compresses as needed - Luke warm to cool showers to decrease itch reflex - Follow up with PCP for BP recheck and labs in 2-4 weeks  Follow Up Instructions: I discussed the assessment and treatment plan with the patient. The patient was provided an opportunity to ask questions and all were answered. The patient agreed with the plan and demonstrated an understanding of the instructions.  A copy of instructions were sent to the patient via MyChart unless otherwise noted below.    The patient was advised to call back or seek an in-person evaluation if the symptoms worsen or if the condition fails to improve as anticipated.    [name], PA-C

## 2023-09-22 NOTE — Telephone Encounter (Signed)
-----   Message from Weldon Picking sent at 09/20/2023  9:42 AM EST ----- Regarding: RE: Reginal Lutes This has been approved and MyChart message sent to patient today. ----- Message ----- From: Marcine Matar, MD Sent: 09/18/2023   4:02 PM EST To: Weldon Picking, CPhT Subject: Reginal Lutes                                         May need prior approval on Wegovy.

## 2023-09-22 NOTE — Patient Instructions (Addendum)
Theresa Cantrell, thank you for joining Theresa Loveless, PA-C for today's virtual visit.  While this provider is not your primary care provider (PCP), if your PCP is located in our provider database this encounter information will be shared with them immediately following your visit.   A [facility] MyChart account gives you access to today's visit and all your visits, tests, and labs performed at [facility] " click here if you don't have a [facility] MyChart account or go to [email or web address]  Consent: (Patient) Theresa Cantrell provided verbal consent for this virtual visit at the beginning of the encounter.  Current Medications:  Current Outpatient Medications:    NIFEdipine (PROCARDIA-XL/NIFEDICAL-XL) 30 MG 24 hr tablet, Take 1 tablet (30 mg total) by mouth daily., Disp: 30 tablet, Rfl: 0   albuterol (VENTOLIN HFA) 108 (90 Base) MCG/ACT inhaler, Inhale 2 puffs into the lungs every 6 (six) hours as needed for wheezing or shortness of breath. (Patient not taking: Reported on 09/17/2023), Disp: 8 g, Rfl: 0   ARIPiprazole (ABILIFY) 10 MG tablet, Take 1 tablet (10 mg total) by mouth daily., Disp: 30 tablet, Rfl: 1   Cholecalciferol (VITAMIN D) 50 MCG (2000 UT) tablet, Take 1 tablet (2,000 Units total) by mouth daily., Disp: 60 tablet, Rfl: 0   cyclobenzaprine (FLEXERIL) 5 MG tablet, TAKE 1 TABLET BY MOUTH ONCE DAILY AS NEEDED FOR MUSCLE SPASM, Disp: 30 tablet, Rfl: 1   diclofenac (VOLTAREN) 75 MG EC tablet, Take 75 mg by mouth 2 (two) times daily as needed., Disp: , Rfl:    FLUoxetine (PROZAC) 20 MG capsule, Take 1 capsule (20 mg total) by mouth daily., Disp: 30 capsule, Rfl: 1   furosemide (LASIX) 20 MG tablet, TAKE 1 TABLET BY MOUTH ON MONDAY, WEDNESDAY AND FRIDAY, Disp: 12 tablet, Rfl: 2   gabapentin (NEURONTIN) 300 MG capsule, Take 300 mg by mouth 3 (three) times daily., Disp: , Rfl:    hydrOXYzine (ATARAX) 10 MG tablet, Take 1 tablet (10 mg total) by mouth 3 (three)  times daily as needed., Disp: 75 tablet, Rfl: 1   prazosin (MINIPRESS) 1 MG capsule, Take 1 capsule (1 mg total) by mouth at bedtime., Disp: 30 capsule, Rfl: 1   Semaglutide-Weight Management (WEGOVY) 0.25 MG/0.5ML SOAJ, Inject 0.25 mg into the skin once a week., Disp: 2 mL, Rfl: 1   traZODone (DESYREL) 100 MG tablet, Take 1 tablet (100 mg total) by mouth at bedtime as needed for sleep., Disp: 30 tablet, Rfl: 1   valsartan (DIOVAN) 80 MG tablet, Take 1 tablet (80 mg total) by mouth daily., Disp: 90 tablet, Rfl: 1   Medications ordered in this encounter:  Meds ordered this encounter  Medications   NIFEdipine (PROCARDIA-XL/NIFEDICAL-XL) 30 MG 24 hr tablet    Sig: Take 1 tablet (30 mg total) by mouth daily.    Dispense:  30 tablet    Refill:  0    Order Specific Question:   Supervising Provider    Answer:   Merrilee Jansky [id number]     *If you need refills on other medications prior to your next appointment, please contact your pharmacy*  Follow-Up: Call back or seek an in-person evaluation if the symptoms worsen or if the condition fails to improve as anticipated.  [facility] [phone]  Other Instructions  - Suspect Chlorthalidone allergy and possible electrolyte deficiency with taking the combination of Furosemide and Chlorthalidone over the last 4 days.  - Advised to STOP Chlorthalidone - Added  Nifedipine low dose 30mg  XR to take for BP - Continue Valsartan and Prazosin - Can continue Furosemide as needed for edema - Take Benadryl and Pepcid (famotidine) for rash and itching as needed - Cold compresses as needed - Luke warm to cool showers to decrease itch reflex - Follow up with PCP for BP recheck and labs in 2-4 weeks  Drug Allergy  A drug allergy happens when the body's disease-fighting system (immune system) reacts badly to a medicine. Drug allergies range from mild to severe. A drug allergy is not the same as a medicine side effect, which is a known  possible reaction to the drug. A drug allergy is also different from medicine toxicity caused by an overdose of the drug. The time of an allergic reaction varies. Symptoms often appear between 1 to 2 hours after taking the medicine; however, some allergic reactions occur 1 week or more after you are exposed to a medicine (delayed reaction). A sudden (acute), severe allergic reaction that affects multiple areas of the body is called an anaphylactic reaction (anaphylaxis). Anaphylaxis can be life-threatening. All allergic reactions to a medicine require medical evaluation, even if the allergic reaction appears to be mild. What are the causes? This condition is caused by the immune system wrongly identifying a medication as being harmful. When this happens, the body releases proteins (antibodies) and other compounds, such as histamine, into the bloodstream. This causes swelling in certain tissues and reduces blood flow to important areas, such as the heart and lungs. Almost any medicine can cause an allergic reaction. Medicines that commonly cause allergic reactions (common allergens) include: Antibiotics, such as penicillin. Sulfa medicines (sulfonamides). Medicines that numb certain areas of the body (local anesthetics). X-ray dyes that contain iodine. Pain-relievers. This includes aspirin and NSAIDs, such as ibuprofen or naproxen sodium. Chemotherapy drugs for treating cancer. Medicines for autoimmune diseases, such as rheumatoid arthritis. What are the signs or symptoms? Common symptoms of a mild allergic reaction include: Nasal congestion. Tingling in the mouth or tongue. An itchy, red rash. Common symptoms of a severe allergic reaction include: Swelling of the face, eyes, lips, or tongue, including the back of the mouth and throat. Difficulty speaking (hoarseness) or swallowing, or making high-pitched whistling sounds, most often when you breathe out (wheezing). Itchy, red, swollen areas of  skin (hives). Dizziness, light-headedness, or fainting. Anxiety or confusion. Chest tightness and fast or irregular heartbeats (palpitations). Abdominal pain, vomiting, or diarrhea. How is this diagnosed? This condition is diagnosed based on a physical exam and your history of recent exposure to one or more medicines. You may be referred for follow-up testing by a health care provider who specializes in allergies. This testing can confirm the diagnosis of a drug allergy and determine which medicines you are allergic to. Testing may include: Skin tests. These may involve: Injecting a small amount of the possible allergen between layers of your skin (intradermal injection). Applying patches to your skin. Blood tests. Drug challenge. For this test, a health care provider gives you a small amount of a medicine in gradual doses while watching for an allergic reaction. If you are unsure of what caused your allergic reaction, your health care provider may ask you for: Information about all medicines that you take on a regular basis. The date and time of your reaction. How is this treated? There is no cure for allergies. However, an allergic reaction can be treated with: Medicines that help: Reduce pain and swelling (NSAIDs). Relieve itching and hives (antihistamines).  Reduce swelling (corticosteroids). Respiratory inhalers. These are inhaled medicines that help open (dilate) the airways in your lungs. Injections of medicine that helps to relax the muscles in your airways and tighten your blood vessels (epinephrine). Severe allergic reactions, such as anaphylaxis, require immediate treatment in a hospital. You may need to be hospitalized for observation. You may also be prescribed rescue medicines, such as epinephrine. Epinephrine comes in many forms, including what is commonly called an auto-injector "pen" (pre-filled automatic epinephrine injection device). Follow these instructions at home: If  you have a severe allergy  Always keep an auto-injector pen or your anaphylaxis kit near you. This can be lifesaving if you have a severe reaction. Use your auto-injector pen or anaphylaxis kit as told by your health care provider. Make sure that you, the members of your household, and your employer know: How to use your auto-injector pen or anaphylaxis kit. How to use your auto-injector pen to give you an epinephrine injection. Replace your auto-injector pen or anaphylaxis kit immediately after use, in case you have another reaction. Wear a medical alert bracelet or necklace that states your drug allergy, if told by your health care provider. General instructions Avoid medicines that you are allergic to. Take over-the-counter and prescription medicines only as told by your health care provider. If you were given medicines to treat your allergic reaction, do not drive until your health care provider tells you it is safe. If you have hives or a rash: Use an over-the-counter antihistamine as told by your health care provider. Apply cold, wet cloths (cold compresses) to your skin or take baths or showers in cool water. Avoid hot water. If you had tests done, it is up to you to get your test results. Ask your health care provider when your results will be ready. Tell all your health care providers that you have a drug allergy. Keep all follow-up visits This is important. Contact a health care provider if: You think that you are having a mild allergic reaction. Symptoms of an allergic reaction usually start within 1 hour after you are exposed to a medicine. You have symptoms that last more than 2 days after your reaction. You develop new signs or symptoms. Get help right away if: You needed to use epinephrine. An epinephrine injection helps to manage life-threatening allergic reactions, but you still need to go to the emergency room even if epinephrine seems to work. This is important because  anaphylaxis may happen again within 72 hours (rebound anaphylaxis). If you used epinephrine to treat anaphylaxis outside of the hospital, you need additional medical care. This may include more doses of epinephrine. You develop signs or symptoms of a severe allergic reaction. These symptoms may represent a serious problem that is an emergency. Do not wait to see if the symptoms will go away. Use your auto-injector pen or anaphylaxis kit as you have been instructed, and get medical help right away. Call your local emergency services (911 in the [place] Do not drive yourself to the hospital. Summary A drug allergy happens when the body's disease-fighting system reacts badly to a medicine. Drug allergies range from mild to severe. In some cases, an allergic reaction may be life-threatening. If you have a severe allergy, always keep an auto-injector pen or your anaphylaxis kit near you. This information is not intended to replace advice given to you by your health care provider. Make sure you discuss any questions you have with your health care provider. Document Revised: 03/31/2021 Document Reviewed:  03/31/2021 Elsevier Patient Education  2024 Elsevier Inc.    If you have been instructed to have an in-person evaluation today at a local Urgent Care facility, please use the link below. It will take you to a list of all of our available [facility], including address, phone number and hours of operation. Please do not delay care.  [facility]  If you or a family member do not have a primary care provider, use the link below to schedule a visit and establish care. When you choose a [facility] primary care physician or advanced practice provider, you gain a long-term partner in health. Find a Primary Care Provider  Learn more about [facility]'s in-office and virtual care options:  - Get Care Now

## 2023-09-23 ENCOUNTER — Other Ambulatory Visit: Payer: Self-pay

## 2023-09-29 ENCOUNTER — Ambulatory Visit: Payer: MEDICAID | Admitting: Obstetrics and Gynecology

## 2023-10-07 ENCOUNTER — Encounter (HOSPITAL_COMMUNITY): Payer: MEDICAID | Admitting: Physician Assistant

## 2023-10-07 ENCOUNTER — Ambulatory Visit: Payer: MEDICAID | Admitting: Orthopaedic Surgery

## 2023-10-07 ENCOUNTER — Other Ambulatory Visit (INDEPENDENT_AMBULATORY_CARE_PROVIDER_SITE_OTHER): Payer: MEDICAID

## 2023-10-07 ENCOUNTER — Encounter (HOSPITAL_COMMUNITY): Payer: Self-pay

## 2023-10-07 DIAGNOSIS — M25561 Pain in right knee: Secondary | ICD-10-CM

## 2023-10-07 MED ORDER — BUPIVACAINE HCL 0.5 % IJ SOLN
2.0000 mL | INTRAMUSCULAR | Status: AC | PRN
Start: 2023-10-07 — End: 2023-10-07
  Administered 2023-10-07: 2 mL via INTRA_ARTICULAR

## 2023-10-07 MED ORDER — METHYLPREDNISOLONE ACETATE 40 MG/ML IJ SUSP
40.0000 mg | INTRAMUSCULAR | Status: AC | PRN
Start: 2023-10-07 — End: 2023-10-07
  Administered 2023-10-07: 40 mg via INTRA_ARTICULAR

## 2023-10-07 MED ORDER — LIDOCAINE HCL 1 % IJ SOLN
2.0000 mL | INTRAMUSCULAR | Status: AC | PRN
Start: 2023-10-07 — End: 2023-10-07
  Administered 2023-10-07: 2 mL

## 2023-10-07 NOTE — Progress Notes (Signed)
Office Visit Note   Patient: Theresa Cantrell           Date of Birth: [birth year]           MRN: [id number] Visit Date: 10/07/2023              Requested by: Marcine Matar, MD [address] [place] [address] [place],  [place] [address] PCP: Marcine Matar, MD   Assessment & Plan: Visit Diagnoses:  1. Acute pain of right knee     Plan: Impression is right knee osteoarthritis flare.  Cortisone injection performed today.  She tolerated this well.  Will see her back as needed.  Follow-Up Instructions: No follow-ups on file.   Orders:  Orders Placed This Encounter  Procedures   Large Joint Inj   XR KNEE 3 VIEW RIGHT   No orders of the defined types were placed in this encounter.     Procedures: Large Joint Inj: R knee on 10/07/2023 9:35 AM Indications: pain Details: 22 G needle  Arthrogram: No  Medications: 40 mg methylPREDNISolone acetate 40 MG/ML; 2 mL lidocaine 1 %; 2 mL bupivacaine 0.5 % Consent was given by the patient. Patient was prepped and draped in the usual sterile fashion.       Clinical Data: No additional findings.   Subjective: Chief Complaint  Patient presents with   Right Knee - Pain    HPI Theresa Cantrell is a 47 year old female here for right knee pain for about 2 weeks.  She has known osteoarthritis and has had cortisone and Visco injections in the past.  She is requesting a cortisone injection today.  Denies any injuries. Review of Systems  Constitutional: Negative.   HENT: Negative.    Eyes: Negative.   Respiratory: Negative.    Cardiovascular: Negative.   Endocrine: Negative.   Musculoskeletal:  Positive for arthralgias.  Neurological: Negative.   Hematological: Negative.   Psychiatric/Behavioral: Negative.    All other systems reviewed and are negative.    Objective: Vital Signs: There were no vitals taken for this visit.  Physical Exam Vitals and nursing note reviewed.  Constitutional:      Appearance: She is well-developed.   HENT:     Head: Normocephalic and atraumatic.  Pulmonary:     Effort: Pulmonary effort is normal.  Abdominal:     Palpations: Abdomen is soft.  Musculoskeletal:     Cervical back: Neck supple.  Skin:    General: Skin is warm.     Capillary Refill: Capillary refill takes less than 2 seconds.  Neurological:     Mental Status: She is alert and oriented to person, place, and time.  Psychiatric:        Behavior: Behavior normal.        Thought Content: Thought content normal.        Judgment: Judgment normal.     Ortho Exam Examination right knee shows trace effusion.  Baseline range of motion. Specialty Comments:  MRI LUMBAR SPINE WITHOUT CONTRAST   TECHNIQUE: Multiplanar, multisequence MR imaging of the lumbar spine was performed. No intravenous contrast was administered.   COMPARISON:  Lumbar spine radiographs 01/12/2023   FINDINGS: Segmentation: There are five lumbar type vertebral bodies. The last full intervertebral disc space is labeled L5-S1. This correlates with the radiographs.   Alignment: Left convex scoliotic curvature but normal alignment in the sagittal plane.   Vertebrae: No acute bony findings or bone lesions. Moderate endplate reactive changes at L5-S1.   Conus medullaris  and cauda equina: Conus extends to the L2 level. Conus and cauda equina appear normal.   Paraspinal and other soft tissues: No significant paraspinal or retroperitoneal findings.   Disc levels:   T12-L1: No significant findings.   L1-2: No significant findings.   L2-3: Mild annular bulge and mild facet disease with mild bilateral lateral recess encroachment but no significant spinal or foraminal stenosis.   L3-4: Bulging annulus with flattening of the ventral thecal sac in combination with mild facet disease and ligamentum flavum thickening contributing to mild bilateral lateral recess stenosis. No significant spinal or foraminal stenosis.   L4-5: Bulging annulus,  moderate facet disease and ligamentum flavum thickening with mild bilateral lateral recess encroachment but no significant spinal or foraminal stenosis.   L5-S1: Shallow broad-based disc protrusion with slight mass effect on the ventral thecal sac and potential irritation of the S1 nerve roots. There is also mild left foraminal narrowing.   IMPRESSION: 1. Mild bilateral lateral recess encroachment at L2-3, L3-4 and L4-5. 2. Shallow broad-based disc protrusion at L5-S1 with slight mass effect on the ventral thecal sac and potential irritation of the S1 nerve roots. There is also mild left foraminal narrowing.     Electronically Signed   By: Rudie Meyer M.D.   On: 03/23/2023 13:25  Imaging: XR KNEE 3 VIEW RIGHT  Result Date: 10/07/2023 X-rays of the right knee shows stable moderate tricompartmental osteoarthritis.    PMFS History: Patient Active Problem List   Diagnosis Date Noted   Protrusion of lumbar intervertebral disc 03/24/2023   Depression with anxiety 11/30/2022   Partial thickness rotator cuff tear 11/16/2022   Adjustment disorder with mixed anxiety and depressed mood 02/11/2022   Rotator cuff tendinitis, right 11/14/2021   S/P cervical spinal fusion 06/17/2021   Spinal stenosis of cervical region 04/02/2021   COVID-19 vaccine series not completed 12/20/2020   Influenza vaccine refused 11/07/2020   Essential hypertension 11/07/2020   Obesity (BMI 30.0-34.9) 11/07/2020   Tobacco dependence 11/07/2020   Gastroesophageal reflux disease without esophagitis 11/07/2020   History of abnormal cervical Pap smear 11/07/2020   History of bipolar disorder 11/07/2020   Bipolar 2 disorder, major depressive episode (HCC) 06/12/2020   PTSD (post-traumatic stress disorder) 06/12/2020   Chronic pain of left knee 10/05/2019   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   Neuropathy 02/07/2014   Hot flashes 02/07/2014   Weight loss 02/07/2014   Anxiety     Past Medical History:  Diagnosis Date   Anxiety    Arthritis    Bipolar 1 disorder (HCC)    Depression    GERD (gastroesophageal reflux disease)    Hypertension    PTSD (post-traumatic stress disorder)     Family History  Problem Relation Age of Onset   Hypertension Mother    Cancer Mother        breast   Anxiety disorder Mother    Hypertension Father    Schizophrenia Father    Bipolar disorder Father    Anxiety disorder Father    Colon cancer Neg Hx    Colon polyps Neg Hx    Esophageal cancer Neg Hx    Rectal cancer Neg Hx    Stomach cancer Neg Hx     Past Surgical History:  Procedure Laterality Date   ABDOMINAL HYSTERECTOMY     ANTERIOR CERVICAL DECOMP/DISCECTOMY FUSION N/A 06/06/2021   Procedure: C4-5, C5-6 ANTERIOR CERVICAL DISCECTOMY FUSION, ALLOGRAFT, PLATE;  Surgeon: Eldred Manges, MD;  Location: [facility]  OR;  Service: Orthopedics;  Laterality: N/A;   CHOLECYSTECTOMY     DILATION AND CURETTAGE OF UTERUS     FOOT SURGERY Bilateral    two pins in each foot   JOINT REPLACEMENT Left 2018   partial knee replacement   KNEE SURGERY Left 2018   partial knee replacement   NECK SURGERY  06/06/2021   C4-5, C5-6 ANTERIOR CERVICAL DISCECTOMY FUSION, ALLOGRAFT, PLATE   Social History   Occupational History   Not on file  Tobacco Use   Smoking status: Former    Current packs/day: 0.25    Types: Cigarettes   Smokeless tobacco: Never  Vaping Use   Vaping status: Never Used  Substance and Sexual Activity   Alcohol use: Yes    Alcohol/week: 2.0 standard drinks of alcohol    Types: 1 Glasses of wine, 1 Cans of beer per week   Drug use: No   Sexual activity: Not Currently    Birth control/protection: Surgical

## 2023-10-09 ENCOUNTER — Other Ambulatory Visit: Payer: Self-pay

## 2023-10-09 ENCOUNTER — Emergency Department (HOSPITAL_COMMUNITY): Payer: MEDICAID

## 2023-10-09 ENCOUNTER — Encounter (HOSPITAL_COMMUNITY): Payer: Self-pay

## 2023-10-09 ENCOUNTER — Emergency Department (HOSPITAL_COMMUNITY)
Admission: EM | Admit: 2023-10-09 | Discharge: 2023-10-09 | Disposition: A | Discharge status: Home / Self Care | Payer: MEDICAID | Attending: Emergency Medicine | Admitting: Emergency Medicine

## 2023-10-09 DIAGNOSIS — M79662 Pain in left lower leg: Secondary | ICD-10-CM | POA: Diagnosis not present

## 2023-10-09 DIAGNOSIS — Z79899 Other long term (current) drug therapy: Secondary | ICD-10-CM | POA: Diagnosis not present

## 2023-10-09 DIAGNOSIS — I10 Essential (primary) hypertension: Secondary | ICD-10-CM | POA: Insufficient documentation

## 2023-10-09 DIAGNOSIS — M25562 Pain in left knee: Secondary | ICD-10-CM | POA: Diagnosis not present

## 2023-10-09 DIAGNOSIS — Y92410 Unspecified street and highway as the place of occurrence of the external cause: Secondary | ICD-10-CM | POA: Insufficient documentation

## 2023-10-09 DIAGNOSIS — M542 Cervicalgia: Secondary | ICD-10-CM | POA: Insufficient documentation

## 2023-10-09 DIAGNOSIS — M25512 Pain in left shoulder: Secondary | ICD-10-CM | POA: Diagnosis not present

## 2023-10-09 MED ORDER — CYCLOBENZAPRINE HCL 10 MG PO TABS: 5.0000 mg | ORAL_TABLET | Freq: Two times a day (BID) | ORAL | 0 refills | Status: DC | PRN

## 2023-10-09 MED ORDER — HYDROCODONE-ACETAMINOPHEN 5-325 MG PO TABS
1.0000 | ORAL_TABLET | Freq: Once | ORAL | Status: AC
  Administered 2023-10-09: 1 via ORAL
  Filled 2023-10-09: qty 1

## 2023-10-09 MED ORDER — LIDOCAINE 5 % EX PTCH
2.0000 | MEDICATED_PATCH | CUTANEOUS | Status: DC
  Administered 2023-10-09: 2 via TRANSDERMAL
  Filled 2023-10-09: qty 2

## 2023-10-09 MED ORDER — METHOCARBAMOL 500 MG PO TABS
500.0000 mg | ORAL_TABLET | Freq: Once | ORAL | Status: AC
  Administered 2023-10-09: 500 mg via ORAL
  Filled 2023-10-09: qty 1

## 2023-10-09 MED ORDER — DIAZEPAM 2 MG PO TABS
2.0000 mg | ORAL_TABLET | Freq: Once | ORAL | Status: AC
  Administered 2023-10-09: 2 mg via ORAL
  Filled 2023-10-09: qty 1

## 2023-10-09 MED ORDER — CELECOXIB 200 MG PO CAPS: 200.0000 mg | ORAL_CAPSULE | Freq: Two times a day (BID) | ORAL | 0 refills | Status: DC
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
## 2023-10-09 NOTE — Discharge Instructions (Addendum)
Return to the emergency department immediately if you develop any of the following symptoms: °You have numbness, tingling, or weakness in the arms or legs. °You develop severe headaches not relieved with medicine. °You have severe neck pain, especially tenderness in the middle of the back of your neck. °You have changes in bowel or bladder control. °There is increasing pain in any area of the body. °You have shortness of breath, light-headedness, dizziness, or fainting. °You have chest pain. °You feel sick to your stomach (nauseous), throw up (vomit), or sweat. °You have increasing abdominal discomfort. °There is blood in your urine, stool, or vomit. °You have pain in your shoulder (shoulder strap areas). °You feel your symptoms are getting worse. ° °
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
## 2023-10-13 ENCOUNTER — Ambulatory Visit (HOSPITAL_COMMUNITY): Payer: MEDICAID | Admitting: Mental Health

## 2023-10-13 ENCOUNTER — Encounter (HOSPITAL_COMMUNITY): Payer: Self-pay

## 2023-10-13 NOTE — Progress Notes (Deleted)
   THERAPIST PROGRESS NOTE  Session Time: ***  Participation Level: {BHH PARTICIPATION LEVEL:22264}  Behavioral Response: {Appearance:22683}{BHH LEVEL OF CONSCIOUSNESS:22305}{BHH MOOD:22306}  Type of Therapy: {CHL AMB BH Type of Therapy:21022741}  Treatment Goals addressed: ***  ProgressTowards Goals: {Progress Towards Goals:21014066}  Interventions: {CHL AMB BH Type of Intervention:21022753}  Summary: Theresa Cantrell is a 47 y.o. female who presents with ***.   Suicidal/Homicidal: {BHH YES OR NO:22294}{yes/no/with/without intent/plan:22693}  Therapist Response: ***  Plan: Return again in *** weeks.  Diagnosis: No diagnosis found.  Collaboration of Care: {BH OP Collaboration of Care:21014065}  Patient/Guardian was advised Release of Information must be obtained prior to any record release in order to collaborate their care with an outside provider. Patient/Guardian was advised if they have not already done so to contact the registration department to sign all necessary forms in order for [place] to release information regarding their care.   Consent: Patient/Guardian gives verbal consent for treatment and assignment of benefits for services provided during this visit. Patient/Guardian expressed understanding and agreed to proceed.   Stephan Minister [place], [facility] 10/13/2023

## 2023-10-15 ENCOUNTER — Other Ambulatory Visit: Payer: Self-pay

## 2023-10-16 ENCOUNTER — Other Ambulatory Visit (HOSPITAL_COMMUNITY): Payer: Self-pay

## 2023-10-18 ENCOUNTER — Other Ambulatory Visit (HOSPITAL_COMMUNITY): Payer: Self-pay

## 2023-10-18 ENCOUNTER — Other Ambulatory Visit: Payer: Self-pay | Admitting: Internal Medicine

## 2023-10-18 ENCOUNTER — Other Ambulatory Visit: Payer: Self-pay

## 2023-10-18 DIAGNOSIS — I10 Essential (primary) hypertension: Secondary | ICD-10-CM

## 2023-10-18 MED ORDER — NIFEDIPINE ER OSMOTIC RELEASE 30 MG PO TB24
30.0000 mg | ORAL_TABLET | Freq: Every day | ORAL | 0 refills | Status: DC
  Filled 2023-10-18 – 2023-10-22 (×2): qty 30, 30d supply, fill #0

## 2023-10-19 ENCOUNTER — Other Ambulatory Visit: Payer: Self-pay

## 2023-10-19 ENCOUNTER — Ambulatory Visit: Payer: MEDICAID | Admitting: Orthopaedic Surgery

## 2023-10-19 ENCOUNTER — Encounter: Payer: Self-pay | Admitting: Pharmacist

## 2023-10-19 ENCOUNTER — Other Ambulatory Visit (INDEPENDENT_AMBULATORY_CARE_PROVIDER_SITE_OTHER): Payer: MEDICAID

## 2023-10-19 ENCOUNTER — Encounter: Payer: Self-pay | Admitting: Orthopaedic Surgery

## 2023-10-19 ENCOUNTER — Other Ambulatory Visit (HOSPITAL_COMMUNITY): Payer: Self-pay

## 2023-10-19 DIAGNOSIS — M25552 Pain in left hip: Secondary | ICD-10-CM | POA: Diagnosis not present

## 2023-10-19 DIAGNOSIS — M1711 Unilateral primary osteoarthritis, right knee: Secondary | ICD-10-CM

## 2023-10-19 MED ORDER — DICLOFENAC SODIUM 75 MG PO TBEC: 75.0000 mg | DELAYED_RELEASE_TABLET | Freq: Two times a day (BID) | ORAL | 2 refills | Status: DC
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
## 2023-10-19 NOTE — Progress Notes (Signed)
No show

## 2023-10-20 ENCOUNTER — Ambulatory Visit (INDEPENDENT_AMBULATORY_CARE_PROVIDER_SITE_OTHER): Payer: MEDICAID | Admitting: Family Medicine

## 2023-10-20 ENCOUNTER — Other Ambulatory Visit (HOSPITAL_COMMUNITY)
Admission: RE | Admit: 2023-10-20 | Discharge: 2023-10-20 | Disposition: A | Discharge status: Home / Self Care | Payer: MEDICAID | Source: Ambulatory Visit | Attending: Family Medicine | Admitting: Family Medicine

## 2023-10-20 ENCOUNTER — Encounter: Payer: Self-pay | Admitting: Family Medicine

## 2023-10-20 VITALS — BP 114/75 | HR 112 | Ht 65.0 in | Wt 208.4 lb

## 2023-10-20 DIAGNOSIS — Z01419 Encounter for gynecological examination (general) (routine) without abnormal findings: Secondary | ICD-10-CM | POA: Diagnosis not present

## 2023-10-20 DIAGNOSIS — Z124 Encounter for screening for malignant neoplasm of cervix: Secondary | ICD-10-CM | POA: Insufficient documentation

## 2023-10-20 DIAGNOSIS — N890 Mild vaginal dysplasia: Secondary | ICD-10-CM | POA: Diagnosis not present

## 2023-10-20 DIAGNOSIS — Z1151 Encounter for screening for human papillomavirus (HPV): Secondary | ICD-10-CM | POA: Diagnosis not present

## 2023-10-20 NOTE — Progress Notes (Signed)
Patient presents for Annual.  LMP: Hysterectomy Last pap: Date: 12/03/21-ASCUS Contraception:  Hysterectomy  Mammogram: Up to date: 2024 STD Screening: Accepts-pap only  Flu Vaccine : Declines  CC:  Annual

## 2023-10-20 NOTE — Progress Notes (Signed)
Subjective:     Theresa Cantrell is a 47 y.o. female and is here for a comprehensive physical exam. The patient reports no problems. 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 Last pap was ASCUS with neg HPV 1 year ago.   The following portions of the patient's history were reviewed and updated as appropriate: allergies, current medications, past family history, past medical history, past social history, past surgical history, and problem list.  Review of Systems Pertinent items noted in HPI and remainder of comprehensive ROS otherwise negative.   Objective:  Chaperone present for exam   BP 114/75   Pulse (!) 112   Ht 5\' 5"  (1.651 m)   Wt 208 lb 6.4 oz (94.5 kg)   BMI 34.68 kg/m  General appearance: alert, cooperative, and appears stated age Head: Normocephalic, without obvious abnormality, atraumatic Neck: no adenopathy, supple, symmetrical, trachea midline, and thyroid not enlarged, symmetric, no tenderness/mass/nodules Lungs: clear to auscultation bilaterally Breasts: normal appearance, no masses or tenderness Heart: regular rate and rhythm, S1, S2 normal, no murmur, click, rub or gallop Abdomen: soft, non-tender; bowel sounds normal; no masses,  no organomegaly Pelvic: external genitalia normal, uterus surgically absent, and vagina normal without discharge Extremities: extremities normal, atraumatic, no cyanosis or edema Pulses: 2+ and symmetric Skin: Skin color, texture, turgor normal. No rashes or lesions Lymph nodes: Cervical, supraclavicular, and axillary nodes normal. Neurologic: Grossly normal    Assessment:    GYN female exam.      Plan:  Encounter for gynecological examination without abnormal finding  VAIN I (vaginal intraepithelial neoplasia grade I) - Plan: Cytology - PAP  Return in 1 year (on 10/19/2024).    See After Visit Summary for Counseling Recommendations

## 2023-10-20 NOTE — Progress Notes (Unsigned)
Office Visit Note   Patient: Theresa Cantrell           Date of Birth: 11-13-1975           MRN: [id number] Visit Date: 10/21/2023              Requested by: Marcine Matar, MD [address] [place] [address] [place],  [place] [address] PCP: Marcine Matar, MD   Assessment & Plan: Visit Diagnoses: No diagnosis found.  Plan: ***  Follow-Up Instructions: No follow-ups on file.   Orders:  No orders of the defined types were placed in this encounter.  No orders of the defined types were placed in this encounter.     Procedures: No procedures performed   Clinical Data: No additional findings.   Subjective: No chief complaint on file.   HPI  Review of Systems  Constitutional: Negative.   HENT: Negative.    Eyes: Negative.   Respiratory: Negative.    Cardiovascular: Negative.   Endocrine: Negative.   Musculoskeletal: Negative.   Neurological: Negative.   Hematological: Negative.   Psychiatric/Behavioral: Negative.    All other systems reviewed and are negative.   Objective: Vital Signs: There were no vitals taken for this visit.  Physical Exam Vitals and nursing note reviewed.  Constitutional:      Appearance: She is well-developed.  HENT:     Head: Normocephalic and atraumatic.  Pulmonary:     Effort: Pulmonary effort is normal.  Abdominal:     Palpations: Abdomen is soft.  Musculoskeletal:     Cervical back: Neck supple.  Skin:    General: Skin is warm.     Capillary Refill: Capillary refill takes less than 2 seconds.  Neurological:     Mental Status: She is alert and oriented to person, place, and time.  Psychiatric:        Behavior: Behavior normal.        Thought Content: Thought content normal.        Judgment: Judgment normal.   Ortho Exam  Specialty Comments:  MRI LUMBAR SPINE WITHOUT CONTRAST   TECHNIQUE: Multiplanar, multisequence MR imaging of the lumbar spine was performed. No intravenous contrast was administered.    COMPARISON:  Lumbar spine radiographs 01/12/2023   FINDINGS: Segmentation: There are five lumbar type vertebral bodies. The last full intervertebral disc space is labeled L5-S1. This correlates with the radiographs.   Alignment: Left convex scoliotic curvature but normal alignment in the sagittal plane.   Vertebrae: No acute bony findings or bone lesions. Moderate endplate reactive changes at L5-S1.   Conus medullaris and cauda equina: Conus extends to the L2 level. Conus and cauda equina appear normal.   Paraspinal and other soft tissues: No significant paraspinal or retroperitoneal findings.   Disc levels:   T12-L1: No significant findings.   L1-2: No significant findings.   L2-3: Mild annular bulge and mild facet disease with mild bilateral lateral recess encroachment but no significant spinal or foraminal stenosis.   L3-4: Bulging annulus with flattening of the ventral thecal sac in combination with mild facet disease and ligamentum flavum thickening contributing to mild bilateral lateral recess stenosis. No significant spinal or foraminal stenosis.   L4-5: Bulging annulus, moderate facet disease and ligamentum flavum thickening with mild bilateral lateral recess encroachment but no significant spinal or foraminal stenosis.   L5-S1: Shallow broad-based disc protrusion with slight mass effect on the ventral thecal sac and potential irritation of the S1 nerve roots. There is  also mild left foraminal narrowing.   IMPRESSION: 1. Mild bilateral lateral recess encroachment at L2-3, L3-4 and L4-5. 2. Shallow broad-based disc protrusion at L5-S1 with slight mass effect on the ventral thecal sac and potential irritation of the S1 nerve roots. There is also mild left foraminal narrowing.     Electronically Signed   By: Rudie Meyer M.D.   On: 03/23/2023 13:25  Imaging: No results found.   PMFS History: Patient Active Problem List   Diagnosis Date Noted  .  Protrusion of lumbar intervertebral disc 03/24/2023  . Depression with anxiety 11/30/2022  . Partial thickness rotator cuff tear 11/16/2022  . Adjustment disorder with mixed anxiety and depressed mood 02/11/2022  . Rotator cuff tendinitis, right 11/14/2021  . S/P cervical spinal fusion 06/17/2021  . Spinal stenosis of cervical region 04/02/2021  . COVID-19 vaccine series not completed 12/20/2020  . Influenza vaccine refused 11/07/2020  . Essential hypertension 11/07/2020  . Obesity (BMI 30.0-34.9) 11/07/2020  . Tobacco dependence 11/07/2020  . Gastroesophageal reflux disease without esophagitis 11/07/2020  . History of abnormal cervical Pap smear 11/07/2020  . History of bipolar disorder 11/07/2020  . Bipolar 2 disorder, major depressive episode (HCC) 06/12/2020  . PTSD (post-traumatic stress disorder) 06/12/2020  . Chronic pain of left knee 10/05/2019  . 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 Neuropathy 02/07/2014  . Hot flashes 02/07/2014  . Weight loss 02/07/2014  . Anxiety    Past Medical History:  Diagnosis Date  . Anxiety   . Arthritis   . Bipolar 1 disorder (HCC)   . Depression   . GERD (gastroesophageal reflux disease)   . Hypertension   . PTSD (post-traumatic stress disorder)     Family History  Problem Relation Age of Onset  . Hypertension Mother   . Cancer Mother        breast  . Anxiety disorder Mother   . Hypertension Father   . Schizophrenia Father   . Bipolar disorder Father   . Anxiety disorder Father   . Parkinson's disease Father   . Colon cancer Neg Hx   . Colon polyps Neg Hx   . Esophageal cancer Neg Hx   . Rectal cancer Neg Hx   . Stomach cancer Neg Hx     Past Surgical History:  Procedure Laterality Date  . ABDOMINAL HYSTERECTOMY    . ANTERIOR CERVICAL DECOMP/DISCECTOMY FUSION N/A 06/06/2021   Procedure: C4-5, C5-6 ANTERIOR CERVICAL DISCECTOMY FUSION, ALLOGRAFT, PLATE;  Surgeon: Eldred Manges, MD;  Location: [facility] OR;   Service: Orthopedics;  Laterality: N/A;  . CHOLECYSTECTOMY    . DILATION AND CURETTAGE OF UTERUS    . FOOT SURGERY Bilateral    two pins in each foot  . JOINT REPLACEMENT Left 2018   partial knee replacement  . KNEE SURGERY Left 2018   partial knee replacement  . NECK SURGERY  06/06/2021   C4-5, C5-6 ANTERIOR CERVICAL DISCECTOMY FUSION, ALLOGRAFT, PLATE   Social History   Occupational History  . Not on file  Tobacco Use  . Smoking status: Former    Current packs/day: 0.25    Types: Cigarettes  . Smokeless tobacco: Never  Vaping Use  . Vaping status: Never Used  Substance and Sexual Activity  . Alcohol use: Yes    Alcohol/week: 2.0 standard drinks of alcohol    Types: 1 Glasses of wine, 1 Cans of beer per week  . Drug use: No  . Sexual activity: Not Currently  Birth control/protection: Surgical

## 2023-10-21 ENCOUNTER — Other Ambulatory Visit: Payer: Self-pay

## 2023-10-21 ENCOUNTER — Ambulatory Visit: Payer: MEDICAID | Admitting: Orthopaedic Surgery

## 2023-10-21 ENCOUNTER — Encounter: Payer: Self-pay | Admitting: Orthopaedic Surgery

## 2023-10-21 DIAGNOSIS — S46812A Strain of other muscles, fascia and tendons at shoulder and upper arm level, left arm, initial encounter: Secondary | ICD-10-CM | POA: Diagnosis not present

## 2023-10-21 LAB — CYTOLOGY - PAP
Chlamydia: NEGATIVE
Comment: NEGATIVE
Comment: NEGATIVE
Comment: NORMAL
Diagnosis: UNDETERMINED — AB
High risk HPV: NEGATIVE
Neisseria Gonorrhea: NEGATIVE

## 2023-10-22 ENCOUNTER — Other Ambulatory Visit (HOSPITAL_COMMUNITY): Payer: Self-pay

## 2023-10-22 ENCOUNTER — Other Ambulatory Visit: Payer: Self-pay

## 2023-10-25 ENCOUNTER — Other Ambulatory Visit (HOSPITAL_COMMUNITY): Payer: Self-pay

## 2023-11-01 ENCOUNTER — Ambulatory Visit: Payer: MEDICAID | Admitting: Orthopaedic Surgery

## 2023-11-01 ENCOUNTER — Encounter: Payer: Self-pay | Admitting: Orthopaedic Surgery

## 2023-11-01 DIAGNOSIS — M25562 Pain in left knee: Secondary | ICD-10-CM | POA: Diagnosis not present
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
## 2023-11-02 ENCOUNTER — Ambulatory Visit: Payer: MEDICAID | Admitting: Orthopaedic Surgery

## 2023-11-02 ENCOUNTER — Other Ambulatory Visit: Payer: Self-pay

## 2023-11-02 DIAGNOSIS — M25562 Pain in left knee: Secondary | ICD-10-CM

## 2023-11-04 ENCOUNTER — Encounter: Payer: Self-pay | Admitting: Internal Medicine

## 2023-11-04 ENCOUNTER — Inpatient Hospital Stay: Payer: MEDICAID | Admitting: Physician Assistant

## 2023-11-04 ENCOUNTER — Telehealth: Payer: Self-pay | Admitting: Physician Assistant

## 2023-11-04 NOTE — Telephone Encounter (Signed)
 Pt called stating that Dr Magnus Ivan was to send referral for massage therapy. Please send referral. Pt phone number is [phone].

## 2023-11-05 ENCOUNTER — Other Ambulatory Visit: Payer: Self-pay | Admitting: Internal Medicine

## 2023-11-05 MED ORDER — VITAMIN D 50 MCG (2000 UT) PO TABS
2000.0000 [IU] | ORAL_TABLET | Freq: Every day | ORAL | 1 refills | Status: DC
  Filled 2023-11-05 – 2023-11-10 (×3): qty 90, 90d supply, fill #0
  Filled 2024-02-08: qty 90, 90d supply, fill #1

## 2023-11-05 NOTE — Telephone Encounter (Signed)
Patient aware of below message

## 2023-11-06 ENCOUNTER — Other Ambulatory Visit (HOSPITAL_COMMUNITY): Payer: Self-pay

## 2023-11-08 ENCOUNTER — Other Ambulatory Visit (HOSPITAL_COMMUNITY): Payer: Self-pay

## 2023-11-08 ENCOUNTER — Encounter: Payer: Self-pay | Admitting: Internal Medicine

## 2023-11-09 ENCOUNTER — Ambulatory Visit: Payer: MEDICAID | Admitting: Orthopaedic Surgery

## 2023-11-09 ENCOUNTER — Encounter: Payer: Self-pay | Admitting: Orthopaedic Surgery

## 2023-11-09 ENCOUNTER — Other Ambulatory Visit (INDEPENDENT_AMBULATORY_CARE_PROVIDER_SITE_OTHER): Payer: MEDICAID

## 2023-11-09 VITALS — BP 127/85 | HR 93 | Ht 64.0 in | Wt 200.0 lb

## 2023-11-09 DIAGNOSIS — Z981 Arthrodesis status: Secondary | ICD-10-CM

## 2023-11-09 DIAGNOSIS — M542 Cervicalgia: Secondary | ICD-10-CM
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
## 2023-11-10 ENCOUNTER — Telehealth: Payer: Self-pay

## 2023-11-10 ENCOUNTER — Other Ambulatory Visit: Payer: Self-pay | Admitting: Internal Medicine

## 2023-11-10 ENCOUNTER — Ambulatory Visit
Admission: RE | Admit: 2023-11-10 | Discharge: 2023-11-10 | Disposition: A | Discharge status: Home / Self Care | Payer: MEDICAID | Source: Ambulatory Visit | Attending: Orthopaedic Surgery | Admitting: Orthopaedic Surgery

## 2023-11-10 ENCOUNTER — Other Ambulatory Visit (HOSPITAL_COMMUNITY): Payer: Self-pay

## 2023-11-10 DIAGNOSIS — M25562 Pain in left knee: Secondary | ICD-10-CM

## 2023-11-10 MED ORDER — WEGOVY 0.25 MG/0.5ML ~~LOC~~ SOAJ
0.2500 mg | SUBCUTANEOUS | 1 refills | Status: DC
  Filled 2023-11-10: qty 2, 28d supply, fill #0

## 2023-11-10 NOTE — Telephone Encounter (Signed)
 Copied from CRM [id number]. Topic: General - Inquiry >> Nov 09, 2023 10:18 AM Donnal HERO wrote: Reason for CRM: Pt is calling requesting an update on the letter requested to recommend that she get disability. Pt sent a message via MyChart on 11/08/2023 with details.  Pt also mentioned she dropped off a form and is wanting to know if it has been filled out.   Please advise.  Pt is requesting a callback.

## 2023-11-10 NOTE — Telephone Encounter (Signed)
 I responded to pt via Mychart and she has read my response already.  Nothing further for you to do.

## 2023-11-11 ENCOUNTER — Telehealth (HOSPITAL_COMMUNITY): Payer: MEDICAID | Admitting: Physician Assistant

## 2023-11-11 ENCOUNTER — Telehealth: Payer: Self-pay | Admitting: Internal Medicine

## 2023-11-11 ENCOUNTER — Other Ambulatory Visit (HOSPITAL_COMMUNITY): Payer: Self-pay | Admitting: Psychiatry

## 2023-11-11 ENCOUNTER — Other Ambulatory Visit: Payer: Self-pay

## 2023-11-11 ENCOUNTER — Encounter: Payer: Self-pay | Admitting: Internal Medicine

## 2023-11-11 ENCOUNTER — Telehealth (HOSPITAL_COMMUNITY): Payer: Self-pay | Admitting: Psychiatry

## 2023-11-11 ENCOUNTER — Encounter: Payer: Self-pay | Admitting: Orthopaedic Surgery

## 2023-11-11 ENCOUNTER — Encounter (HOSPITAL_COMMUNITY): Payer: MEDICAID | Admitting: Physician Assistant

## 2023-11-11 DIAGNOSIS — S46812A Strain of other muscles, fascia and tendons at shoulder and upper arm level, left arm, initial encounter: Secondary | ICD-10-CM

## 2023-11-11 DIAGNOSIS — F4310 Post-traumatic stress disorder, unspecified: Secondary | ICD-10-CM

## 2023-11-11 DIAGNOSIS — F419 Anxiety disorder, unspecified: Secondary | ICD-10-CM

## 2023-11-11 DIAGNOSIS — F3181 Bipolar II disorder: Secondary | ICD-10-CM

## 2023-11-11 MED ORDER — PRAZOSIN HCL 1 MG PO CAPS: 1.0000 mg | ORAL_CAPSULE | Freq: Every day | ORAL | 1 refills | Status: DC

## 2023-11-11 MED ORDER — ARIPIPRAZOLE 10 MG PO TABS: 10.0000 mg | ORAL_TABLET | Freq: Every day | ORAL | 1 refills | Status: DC

## 2023-11-11 MED ORDER — HYDROXYZINE HCL 10 MG PO TABS: 10.0000 mg | ORAL_TABLET | Freq: Three times a day (TID) | ORAL | 1 refills | Status: DC | PRN

## 2023-11-11 MED ORDER — FLUOXETINE HCL 20 MG PO CAPS: 20.0000 mg | ORAL_CAPSULE | Freq: Every day | ORAL | 1 refills | Status: DC

## 2023-11-11 MED ORDER — TRAZODONE HCL 100 MG PO TABS: 100.0000 mg | ORAL_TABLET | Freq: Every evening | ORAL | 1 refills | Status: DC | PRN

## 2023-11-11 NOTE — Telephone Encounter (Signed)
 Noted! Thank you

## 2023-11-11 NOTE — Telephone Encounter (Signed)
 Copied from CRM [id number]. Topic: General - Inquiry >> Nov 11, 2023  8:28 AM Nathanel DEL wrote: Reason for CRM: pt states she dropped off a form [employer] [facility] at the top.  Wants to know if it is ready for pick up.  Pt states she needs it right away. Pt would like call back asap to update

## 2023-11-11 NOTE — Telephone Encounter (Signed)
 Please order PT for shoulder.  Thanks.

## 2023-11-11 NOTE — Telephone Encounter (Signed)
 Form successfully faxed back to [facility] on 11/11/2023. Called & spoke to the patient. Verified name & DOB. Informed that form left at the front desk for pick up.

## 2023-11-11 NOTE — Telephone Encounter (Signed)
 Medication refilled and sent to preferred pharmacy. Patient rescheduled for future appointment.

## 2023-11-17 ENCOUNTER — Ambulatory Visit: Payer: MEDICAID | Admitting: Orthopaedic Surgery

## 2023-11-17 ENCOUNTER — Encounter: Payer: Self-pay | Admitting: Orthopaedic Surgery

## 2023-11-17 DIAGNOSIS — M25562 Pain in left knee: Secondary | ICD-10-CM
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
## 2023-11-18 ENCOUNTER — Encounter: Payer: Self-pay | Admitting: Physician Assistant

## 2023-11-18 ENCOUNTER — Other Ambulatory Visit: Payer: Self-pay | Admitting: Internal Medicine

## 2023-11-18 ENCOUNTER — Ambulatory Visit: Payer: No Typology Code available for payment source | Attending: Physician Assistant | Admitting: Physician Assistant

## 2023-11-18 ENCOUNTER — Ambulatory Visit (INDEPENDENT_AMBULATORY_CARE_PROVIDER_SITE_OTHER): Payer: MEDICAID | Admitting: Mental Health

## 2023-11-18 DIAGNOSIS — M25512 Pain in left shoulder: Secondary | ICD-10-CM | POA: Diagnosis not present

## 2023-11-18 DIAGNOSIS — F4310 Post-traumatic stress disorder, unspecified: Secondary | ICD-10-CM | POA: Diagnosis not present

## 2023-11-18 DIAGNOSIS — M545 Low back pain, unspecified: Secondary | ICD-10-CM

## 2023-11-18 DIAGNOSIS — I10 Essential (primary) hypertension: Secondary | ICD-10-CM | POA: Diagnosis not present

## 2023-11-18 DIAGNOSIS — G8929 Other chronic pain: Secondary | ICD-10-CM

## 2023-11-18 DIAGNOSIS — F3181 Bipolar II disorder: Secondary | ICD-10-CM | POA: Diagnosis not present

## 2023-11-18 DIAGNOSIS — Z09 Encounter for follow-up examination after completed treatment for conditions other than malignant neoplasm: Secondary | ICD-10-CM

## 2023-11-18 DIAGNOSIS — F419 Anxiety disorder, unspecified: Secondary | ICD-10-CM

## 2023-11-18 MED ORDER — VALSARTAN 80 MG PO TABS: 80.0000 mg | ORAL_TABLET | Freq: Every day | ORAL | 1 refills | Status: DC

## 2023-11-18 MED ORDER — NIFEDIPINE ER OSMOTIC RELEASE 30 MG PO TB24: 30.0000 mg | ORAL_TABLET | Freq: Every day | ORAL | 1 refills | Status: DC
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
## 2023-11-18 NOTE — Telephone Encounter (Signed)
Requested medication (s) are due for refill today:   Pt is there now seeing Georgian Co, PA-C 11/18/2023 at 3:08 PM time of this encounter.  Requested medication (s) are on the active medication list:   Yes  Future visit scheduled:   In appt now   Last ordered: 10/18/2023 for #30, 0 refills  Returned for the appt to decide on refills.      Requested Prescriptions  Pending Prescriptions Disp Refills   NIFEdipine (PROCARDIA-XL/NIFEDICAL-XL) 30 MG 24 hr tablet 30 tablet 0    Sig: Take 1 tablet (30 mg total) by mouth daily.     Cardiovascular: Calcium Channel Blockers 2 Passed - 11/18/2023  3:07 PM      Passed - Last BP in normal range    BP Readings from Last 1 Encounters:  11/18/23 119/78         Passed - Last Heart Rate in normal range    Pulse Readings from Last 1 Encounters:  11/18/23 82         Passed - Valid encounter within last 6 months    Recent Outpatient Visits           Today    [place] [facility] [place] [facility] [place]. [facility], Marzella Schlein, [place]   2 months ago Essential hypertension   [place] [facility] [place] - [facility] Dept Of [place]. [facility] Marcine Matar, MD   4 months ago Essential hypertension   [place] [facility] [place] - A Dept Of [place]. [facility] Lois Huxley, [place] L, RPH-CPP   5 months ago Chronic low back pain, unspecified back pain laterality, unspecified whether sciatica present   [place] [facility] [place] - A Dept Of [place]. [facility] Marcine Matar, MD   6 months ago Essential hypertension    [facility] [place] - A Dept Of [place]. [facility] Marcine Matar, MD       Future Appointments             In 1 week Marcine Matar, MD [facility] [place] - A Dept Of Eligha Bridegroom. [facility]   In 1 month Ophelia Charter, Veverly Fells, MD [facility]   In 1 month Magnus Ivan Vanita Panda, MD [facility]

## 2023-11-20 NOTE — Telephone Encounter (Signed)
Message acknowledged and reviewed.

## 2023-11-22 ENCOUNTER — Other Ambulatory Visit (HOSPITAL_COMMUNITY): Payer: Self-pay

## 2023-11-23 ENCOUNTER — Other Ambulatory Visit: Payer: Self-pay | Admitting: Internal Medicine

## 2023-11-23 DIAGNOSIS — I10 Essential (primary) hypertension: Secondary | ICD-10-CM
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
## 2023-11-24 ENCOUNTER — Ambulatory Visit: Payer: MEDICAID | Admitting: Orthopaedic Surgery

## 2023-11-24 DIAGNOSIS — M5416 Radiculopathy, lumbar region: Secondary | ICD-10-CM

## 2023-11-24 MED ORDER — PREDNISONE 10 MG (21) PO TBPK
ORAL_TABLET | ORAL | 3 refills | Status: DC
Start: 2023-11-24 — End: 2023-12-07

## 2023-11-24 NOTE — Progress Notes (Signed)
Office Visit Note   Patient: Theresa Cantrell           Date of Birth: [birth year]           MRN: [id number] Visit Date: 11/24/2023              Requested by: Marcine Matar, MD [address] [place],  [place] [address] PCP: Marcine Matar, MD   Assessment & Plan: Visit Diagnoses:  1. Lumbar radiculopathy     Plan: [name] is a 48 year old female with low back pain and left leg pain consistent with lumbar radiculopathy.  Patient is already enrolled in a pain management clinic in [place].  I recommend that she follow-up with them and it sounds like she already has an appointment with them.  I will refill the prednisone Dosepak for her.  Follow-up as needed.  Follow-Up Instructions: No follow-ups on file.   Orders:  No orders of the defined types were placed in this encounter.  Meds ordered this encounter  Medications   predniSONE (STERAPRED UNI-PAK 21 TAB) 10 MG (21) TBPK tablet    Sig: Take as directed    Dispense:  21 tablet    Refill:  3      Procedures: No procedures performed   Clinical Data: No additional findings.   Subjective: Chief Complaint  Patient presents with   Left Hip - Pain    HPI Theresa Cantrell comes in with low back pain and left lower extremity pain that wraps around the lateral thigh.  Reports tingling and constant pain.  Cannot drive for more than 10 to 15 minutes.  She is in pain management in [place]. Review of Systems  Constitutional: Negative.   HENT: Negative.    Eyes: Negative.   Respiratory: Negative.    Cardiovascular: Negative.   Endocrine: Negative.   Musculoskeletal: Negative.   Neurological: Negative.   Hematological: Negative.   Psychiatric/Behavioral: Negative.    All other systems reviewed and are negative.    Objective: Vital Signs: There were no vitals taken for this visit.  Physical Exam Vitals and nursing note reviewed.  Constitutional:      Appearance: She is well-developed.  HENT:      Head: Atraumatic.     Nose: Nose normal.  Eyes:     Extraocular Movements: Extraocular movements intact.  Cardiovascular:     Pulses: Normal pulses.  Pulmonary:     Effort: Pulmonary effort is normal.  Abdominal:     Palpations: Abdomen is soft.  Musculoskeletal:     Cervical back: Neck supple.  Skin:    General: Skin is warm.     Capillary Refill: Capillary refill takes less than 2 seconds.  Neurological:     Mental Status: She is alert. Mental status is at baseline.  Psychiatric:        Behavior: Behavior normal.        Thought Content: Thought content normal.        Judgment: Judgment normal.     Ortho Exam Examination of the lumbar spine left lower extremity shows no focal motor or sensory deficits. Specialty Comments:  MRI LUMBAR SPINE WITHOUT CONTRAST   TECHNIQUE: Multiplanar, multisequence MR imaging of the lumbar spine was performed. No intravenous contrast was administered.   COMPARISON:  Lumbar spine radiographs 01/12/2023   FINDINGS: Segmentation: There are five lumbar type vertebral bodies. The last full intervertebral disc space is labeled L5-S1. This correlates with the radiographs.   Alignment: Left convex scoliotic  curvature but normal alignment in the sagittal plane.   Vertebrae: No acute bony findings or bone lesions. Moderate endplate reactive changes at L5-S1.   Conus medullaris and cauda equina: Conus extends to the L2 level. Conus and cauda equina appear normal.   Paraspinal and other soft tissues: No significant paraspinal or retroperitoneal findings.   Disc levels:   T12-L1: No significant findings.   L1-2: No significant findings.   L2-3: Mild annular bulge and mild facet disease with mild bilateral lateral recess encroachment but no significant spinal or foraminal stenosis.   L3-4: Bulging annulus with flattening of the ventral thecal sac in combination with mild facet disease and ligamentum flavum thickening contributing to  mild bilateral lateral recess stenosis. No significant spinal or foraminal stenosis.   L4-5: Bulging annulus, moderate facet disease and ligamentum flavum thickening with mild bilateral lateral recess encroachment but no significant spinal or foraminal stenosis.   L5-S1: Shallow broad-based disc protrusion with slight mass effect on the ventral thecal sac and potential irritation of the S1 nerve roots. There is also mild left foraminal narrowing.   IMPRESSION: 1. Mild bilateral lateral recess encroachment at L2-3, L3-4 and L4-5. 2. Shallow broad-based disc protrusion at L5-S1 with slight mass effect on the ventral thecal sac and potential irritation of the S1 nerve roots. There is also mild left foraminal narrowing.     Electronically Signed   By: Rudie Meyer M.D.   On: 03/23/2023 13:25  Imaging: No results found.   PMFS History: Patient Active Problem List   Diagnosis Date Noted   Protrusion of lumbar intervertebral disc 03/24/2023   Depression with anxiety 11/30/2022   Partial thickness rotator cuff tear 11/16/2022   Adjustment disorder with mixed anxiety and depressed mood 02/11/2022   Rotator cuff tendinitis, right 11/14/2021   S/P cervical spinal fusion 06/17/2021   COVID-19 vaccine series not completed 12/20/2020   Influenza vaccine refused 11/07/2020   Essential hypertension 11/07/2020   Obesity (BMI 30.0-34.9) 11/07/2020   Tobacco dependence 11/07/2020   Gastroesophageal reflux disease without esophagitis 11/07/2020   History of abnormal cervical Pap smear 11/07/2020   History of bipolar disorder 11/07/2020   Bipolar 2 disorder, major depressive episode (HCC) 06/12/2020   PTSD (post-traumatic stress disorder) 06/12/2020   Chronic pain of left knee 10/05/2019   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   Neuropathy 02/07/2014   Hot flashes 02/07/2014   Weight loss 02/07/2014   Anxiety    Past Medical History:  Diagnosis Date    Anxiety    Arthritis    Bipolar 1 disorder (HCC)    Depression    GERD (gastroesophageal reflux disease)    Hypertension    PTSD (post-traumatic stress disorder)     Family History  Problem Relation Age of Onset   Hypertension Mother    Cancer Mother        breast   Anxiety disorder Mother    Hypertension Father    Schizophrenia Father    Bipolar disorder Father    Anxiety disorder Father    Parkinson's disease Father    Colon cancer Neg Hx    Colon polyps Neg Hx    Esophageal cancer Neg Hx    Rectal cancer Neg Hx    Stomach cancer Neg Hx     Past Surgical History:  Procedure Laterality Date   ABDOMINAL HYSTERECTOMY     ANTERIOR CERVICAL DECOMP/DISCECTOMY FUSION N/A 06/06/2021   Procedure: C4-5, C5-6 ANTERIOR CERVICAL DISCECTOMY FUSION, ALLOGRAFT, PLATE;  Surgeon: Eldred Manges, MD;  Location: [facility] OR;  Service: Orthopedics;  Laterality: N/A;   CHOLECYSTECTOMY     DILATION AND CURETTAGE OF UTERUS     FOOT SURGERY Bilateral    two pins in each foot   JOINT REPLACEMENT Left 2018   partial knee replacement   KNEE SURGERY Left 2018   partial knee replacement   NECK SURGERY  06/06/2021   C4-5, C5-6 ANTERIOR CERVICAL DISCECTOMY FUSION, ALLOGRAFT, PLATE   Social History   Occupational History   Not on file  Tobacco Use   Smoking status: Former    Current packs/day: 0.25    Types: Cigarettes   Smokeless tobacco: Never  Vaping Use   Vaping status: Never Used  Substance and Sexual Activity   Alcohol use: Yes    Alcohol/week: 2.0 standard drinks of alcohol    Types: 1 Glasses of wine, 1 Cans of beer per week   Drug use: No   Sexual activity: Not Currently    Birth control/protection: Surgical

## 2023-11-25 ENCOUNTER — Ambulatory Visit: Payer: MEDICAID | Attending: Orthopaedic Surgery | Admitting: Physical Therapy

## 2023-11-25 ENCOUNTER — Other Ambulatory Visit: Payer: Self-pay

## 2023-11-25 DIAGNOSIS — M25512 Pain in left shoulder: Secondary | ICD-10-CM | POA: Diagnosis present

## 2023-11-25 DIAGNOSIS — S46812A Strain of other muscles, fascia and tendons at shoulder and upper arm level, left arm, initial encounter: Secondary | ICD-10-CM | POA: Insufficient documentation

## 2023-11-25 DIAGNOSIS — M542 Cervicalgia: Secondary | ICD-10-CM | POA: Insufficient documentation

## 2023-11-25 DIAGNOSIS — G8929 Other chronic pain: Secondary | ICD-10-CM | POA: Insufficient documentation

## 2023-11-25 DIAGNOSIS — M6281 Muscle weakness (generalized): Secondary | ICD-10-CM | POA: Insufficient documentation

## 2023-11-25 DIAGNOSIS — R296 Repeated falls: Secondary | ICD-10-CM | POA: Diagnosis present
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
## 2023-11-26 ENCOUNTER — Other Ambulatory Visit: Payer: Self-pay

## 2023-11-26 ENCOUNTER — Ambulatory Visit: Payer: MEDICAID | Attending: Internal Medicine | Admitting: Internal Medicine

## 2023-11-26 VITALS — BP 126/84 | HR 101 | Temp 98.1°F | Ht 64.0 in | Wt 210.0 lb

## 2023-11-26 DIAGNOSIS — Z2821 Immunization not carried out because of patient refusal: Secondary | ICD-10-CM

## 2023-11-26 DIAGNOSIS — F3181 Bipolar II disorder: Secondary | ICD-10-CM | POA: Diagnosis not present

## 2023-11-26 DIAGNOSIS — E66812 Obesity, class 2: Secondary | ICD-10-CM

## 2023-11-26 DIAGNOSIS — Z5986 Financial insecurity: Secondary | ICD-10-CM

## 2023-11-26 DIAGNOSIS — Z6836 Body mass index (BMI) 36.0-36.9, adult: Secondary | ICD-10-CM

## 2023-11-26 DIAGNOSIS — G8929 Other chronic pain: Secondary | ICD-10-CM | POA: Diagnosis not present

## 2023-11-26 DIAGNOSIS — Z5941 Food insecurity: Secondary | ICD-10-CM

## 2023-11-26 DIAGNOSIS — M545 Low back pain, unspecified: Secondary | ICD-10-CM

## 2023-11-26 DIAGNOSIS — I10 Essential (primary) hypertension: Secondary | ICD-10-CM | POA: Diagnosis not present

## 2023-11-26 MED ORDER — WEGOVY 0.25 MG/0.5ML ~~LOC~~ SOAJ
0.5000 mg | SUBCUTANEOUS | 1 refills | Status: DC
  Filled 2023-11-26: qty 2, 14d supply, fill #0

## 2023-11-26 NOTE — Patient Instructions (Addendum)
Keep appointment with nutritionist next week. Increase Wegovy to 0.5 mg once a week.  After you have been on it for at least 1 month, you can send me a MyChart message.  If you are tolerating it, we can increase the dose further at that time. Continue to check your blood pressure at least twice a week.  The goal is 130/80 or lower.  Bring your readings with you on next visit.
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
## 2023-11-27 ENCOUNTER — Encounter: Payer: Self-pay | Admitting: Internal Medicine

## 2023-11-29 ENCOUNTER — Encounter: Payer: Self-pay | Admitting: Internal Medicine

## 2023-11-29 ENCOUNTER — Telehealth: Payer: Self-pay

## 2023-11-29 ENCOUNTER — Other Ambulatory Visit: Payer: Self-pay

## 2023-11-29 ENCOUNTER — Ambulatory Visit: Payer: MEDICAID | Admitting: Skilled Nursing Facility1

## 2023-11-29 ENCOUNTER — Other Ambulatory Visit: Payer: Self-pay | Admitting: Pharmacist

## 2023-11-29 MED ORDER — WEGOVY 0.5 MG/0.5ML ~~LOC~~ SOAJ
0.5000 mg | SUBCUTANEOUS | 0 refills | Status: DC
  Filled 2023-11-29 (×2): qty 2, 28d supply, fill #0
  Filled ????-??-??: fill #0

## 2023-11-29 NOTE — Telephone Encounter (Signed)
Copied from CRM [id number]. Topic: Clinical - Prescription Issue >> Nov 29, 2023  9:34 AM Geroge Baseman wrote: Reason for CRM: Pharmacy states that they do not have this patients medication. She is on .25 and is supposed to be taking the .50 but the pens she has do not change dosages.

## 2023-11-30 ENCOUNTER — Encounter: Payer: Self-pay | Admitting: Physical Therapy

## 2023-11-30 ENCOUNTER — Ambulatory Visit: Payer: MEDICAID | Admitting: Physical Therapy

## 2023-11-30 DIAGNOSIS — M542 Cervicalgia: Secondary | ICD-10-CM | POA: Diagnosis not present

## 2023-11-30 DIAGNOSIS — G8929 Other chronic pain: Secondary | ICD-10-CM

## 2023-11-30 DIAGNOSIS — M6281 Muscle weakness (generalized): Secondary | ICD-10-CM

## 2023-11-30 NOTE — Patient Instructions (Signed)
Trigger Point Dry Needling  What is Trigger Point Dry Needling (DN)? DN is a physical therapy technique used to treat muscle pain and dysfunction. Specifically, DN helps deactivate muscle trigger points (muscle knots).  A thin filiform needle is used to penetrate the skin and stimulate the underlying trigger point. The goal is for a local twitch response (LTR) to occur and for the trigger point to relax. No medication of any kind is injected during the procedure.   What Does Trigger Point Dry Needling Feel Like?  The procedure feels different for each individual patient. Some patients report that they do not actually feel the needle enter the skin and overall the process is not painful. Very mild bleeding may occur. However, many patients feel a deep cramping in the muscle in which the needle was inserted. This is the local twitch response.   How Will I feel after the treatment? Soreness is normal, and the onset of soreness may not occur for a few hours. Typically this soreness does not last longer than two days.  Bruising is uncommon, however; ice can be used to decrease any possible bruising.  In rare cases feeling tired or nauseous after the treatment is normal. In addition, your symptoms may get worse before they get better, this period will typically not last longer than 24 hours.   What Can I do After My Treatment? Increase your hydration by drinking more water for the next 24 hours.  You may place ice or heat on the areas treated that have become sore, however, do not use heat on inflamed or bruised areas. Heat often brings more relief post needling. You can continue your regular activities, but vigorous activity is not recommended initially after the treatment for 24 hours. DN is best combined with other physical therapy such as strengthening, stretching, and other therapies.   What are the complications? While your therapist has had extensive training in minimizing the risks of trigger  point dry needling, it is important to understand the risks of any procedure.  Risks include bleeding, pain, fatigue, hematoma, infection, vertigo, nausea or nerve involvement. Monitor for any changes to your skin or sensation. Contact your therapist or MD with concerns.  A rare but serious complication is a pneumothorax over or near your middle and upper chest and back If you have dry needling in this area, monitor for the following symptoms: Shortness of breath on exertion and/or Difficulty taking a deep breath and/or Chest Pain and/or A dry cough If any of the above symptoms develop, please go to the nearest emergency room or call 911. Tell them you had dry needling over your thorax and report any symptoms you are having. Please follow-up with your treating therapist after you complete the medical evaluation.     Garen Lah, PT, ATRIC Certified Exercise Expert for the Aging Adult  11/30/23 10:24 AM Phone: [phone] Fax: [phone]
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
## 2023-12-01 ENCOUNTER — Telehealth: Payer: MEDICAID | Admitting: Nurse Practitioner

## 2023-12-01 DIAGNOSIS — A6004 Herpesviral vulvovaginitis: Secondary | ICD-10-CM

## 2023-12-01 MED ORDER — VALACYCLOVIR HCL 1 G PO TABS: 1000.0000 mg | ORAL_TABLET | Freq: Every day | ORAL | 0 refills | Status: AC
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
## 2023-12-02 ENCOUNTER — Encounter: Payer: Self-pay | Admitting: Physical Therapy

## 2023-12-02 ENCOUNTER — Ambulatory Visit: Payer: MEDICAID | Admitting: Physical Therapy

## 2023-12-02 DIAGNOSIS — M542 Cervicalgia: Secondary | ICD-10-CM

## 2023-12-02 MED ORDER — WEGOVY 0.5 MG/0.5ML ~~LOC~~ SOAJ
0.5000 mg | SUBCUTANEOUS | 0 refills | Status: DC
  Filled 2023-12-02 – 2023-12-06 (×2): qty 2, 28d supply, fill #0

## 2023-12-02 MED ORDER — WEGOVY 0.5 MG/0.5ML ~~LOC~~ SOAJ: 0.5000 mg | SUBCUTANEOUS | 0 refills | Status: DC

## 2023-12-02 NOTE — Telephone Encounter (Signed)
Which dosage is patient to be on.
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
## 2023-12-02 NOTE — Addendum Note (Signed)
Addended by: Jonah Blue B on: 12/02/2023 06:20 PM   Modules accepted: Orders

## 2023-12-02 NOTE — Telephone Encounter (Signed)
Phone call placed to patient this evening.  She tells me she has 1 more pen of the 0.25 mg of Wegovy.  We had discussed increasing the dose to the 0.5 mg.  The 0.25 mg pen does not dial up to the 0.5.  I sent an updated prescription to the pharmacy for the 0.5 mg this evening.  Patient requested that it be sent to our pharmacy.

## 2023-12-03 ENCOUNTER — Other Ambulatory Visit: Payer: Self-pay

## 2023-12-06 ENCOUNTER — Other Ambulatory Visit: Payer: Self-pay

## 2023-12-07 ENCOUNTER — Other Ambulatory Visit: Payer: Self-pay

## 2023-12-07 ENCOUNTER — Encounter: Payer: Self-pay | Admitting: Family Medicine

## 2023-12-07 ENCOUNTER — Ambulatory Visit: Payer: MEDICAID | Attending: Family Medicine | Admitting: Family Medicine

## 2023-12-07 ENCOUNTER — Encounter: Payer: Self-pay | Admitting: Physical Therapy

## 2023-12-07 ENCOUNTER — Ambulatory Visit: Payer: MEDICAID | Attending: Orthopaedic Surgery | Admitting: Physical Therapy

## 2023-12-07 VITALS — BP 123/80 | HR 89 | Temp 98.5°F | Ht 64.0 in | Wt 215.4 lb

## 2023-12-07 DIAGNOSIS — M6281 Muscle weakness (generalized): Secondary | ICD-10-CM | POA: Diagnosis present

## 2023-12-07 DIAGNOSIS — J069 Acute upper respiratory infection, unspecified: Secondary | ICD-10-CM | POA: Diagnosis not present

## 2023-12-07 DIAGNOSIS — G8929 Other chronic pain: Secondary | ICD-10-CM | POA: Diagnosis present

## 2023-12-07 DIAGNOSIS — M25512 Pain in left shoulder: Secondary | ICD-10-CM | POA: Insufficient documentation

## 2023-12-07 DIAGNOSIS — R059 Cough, unspecified: Secondary | ICD-10-CM

## 2023-12-07 DIAGNOSIS — M542 Cervicalgia: Secondary | ICD-10-CM | POA: Insufficient documentation

## 2023-12-07 LAB — POC COVID19/FLU A&B COMBO
Covid Antigen, POC: NEGATIVE
Influenza A Antigen, POC: NEGATIVE
Influenza B Antigen, POC: NEGATIVE

## 2023-12-07 MED ORDER — BENZONATATE 100 MG PO CAPS
100.0000 mg | ORAL_CAPSULE | Freq: Three times a day (TID) | ORAL | 0 refills | Status: DC | PRN
  Filled 2023-12-07: qty 21, 7d supply, fill #0

## 2023-12-07 MED ORDER — PREDNISONE 10 MG (21) PO TBPK
ORAL_TABLET | ORAL | 3 refills | Status: DC
  Filled 2023-12-07: qty 21, 6d supply, fill #0

## 2023-12-07 MED ORDER — BENZONATATE 100 MG PO CAPS: 100.0000 mg | ORAL_CAPSULE | Freq: Three times a day (TID) | ORAL | 0 refills | Status: DC | PRN

## 2023-12-07 MED ORDER — PREDNISONE 20 MG PO TABS: 20.0000 mg | ORAL_TABLET | Freq: Every day | ORAL | 0 refills | Status: DC
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
## 2023-12-07 NOTE — Patient Instructions (Signed)
 VISIT SUMMARY:  You came in today because of a persistent cough that has been bothering you for the past two days and causing throat discomfort. You mentioned that you were recently exposed to a sick child and a friend who also became ill. You have experienced cold sweats and chills but no fever, runny nose, or breathing difficulties. The cough has been severe enough to cause vomiting once. You have been using Tylenol , ibuprofen , and Delsym with minimal relief.  YOUR PLAN:  -UPPER RESPIRATORY INFECTION: An upper respiratory infection is a common viral infection that affects the nose, throat, and airways. You should continue taking Prednisone  and Tessalon  perles as prescribed to help reduce inflammation and suppress the cough. Make sure to rest and stay hydrated. You can use Tylenol  if you develop body aches. You tested negative for COVID and the flu.  INSTRUCTIONS:  Please follow the prescribed medication regimen and take care of yourself by resting and staying hydrated. If your symptoms worsen or you develop new symptoms, please contact our office.

## 2023-12-07 NOTE — Progress Notes (Signed)
 Subjective:  Patient ID: Theresa Cantrell, female    DOB: 31-May-1976  Age: 48 y.o. MRN: [id number]  CC: Cough   HPI Theresa Cantrell is a 48 y.o. year old female patient of Dr. Vicci with a history of hypertension, back pain, obesity.  Interval History: Discussed the use of AI scribe software for clinical note transcription with the patient, who gave verbal consent to proceed.   The patient presents with a two-day history of a persistent cough that is causing significant throat discomfort. Theresa Cantrell reports no associated symptoms such as runny nose, facial pressure, or breathing difficulties. The cough is present throughout the day and night. Theresa Cantrell was recently exposed to a sick child and her friend, who also became ill around the same time. Theresa Cantrell denies fever but did experience cold sweats and chills the previous night. The cough has been severe enough to induce vomiting on one occasion. Theresa Cantrell has been self-medicating with Tylenol , ibuprofen , and over-the-counter Delsym cough suppressant, which has provided minimal relief. The patient has a history of smoking but quit two years ago. Theresa Cantrell denies any known respiratory conditions.        Past Medical History:  Diagnosis Date   Anxiety    Arthritis    Bipolar 1 disorder (HCC)    Depression    GERD (gastroesophageal reflux disease)    Hypertension    PTSD (post-traumatic stress disorder)     Past Surgical History:  Procedure Laterality Date   ABDOMINAL HYSTERECTOMY     ANTERIOR CERVICAL DECOMP/DISCECTOMY FUSION N/A 06/06/2021   Procedure: C4-5, C5-6 ANTERIOR CERVICAL DISCECTOMY FUSION, ALLOGRAFT, PLATE;  Surgeon: Barbarann Oneil BROCKS, MD;  Location: [facility] OR;  Service: Orthopedics;  Laterality: N/A;   CHOLECYSTECTOMY     DILATION AND CURETTAGE OF UTERUS     FOOT SURGERY Bilateral    two pins in each foot   JOINT REPLACEMENT Left 2018   partial knee replacement   KNEE SURGERY Left 2018   partial knee replacement   NECK SURGERY  06/06/2021   C4-5,  C5-6 ANTERIOR CERVICAL DISCECTOMY FUSION, ALLOGRAFT, PLATE    Family History  Problem Relation Age of Onset   Hypertension Mother    Cancer Mother        breast   Anxiety disorder Mother    Hypertension Father    Schizophrenia Father    Bipolar disorder Father    Anxiety disorder Father    Parkinson's disease Father    Colon cancer Neg Hx    Colon polyps Neg Hx    Esophageal cancer Neg Hx    Rectal cancer Neg Hx    Stomach cancer Neg Hx     Social History   Socioeconomic History   Marital status: Single    Spouse name: Not on file   Number of children: 1   Years of education: Not on file   Highest education level: Some college, no degree  Occupational History   Not on file  Tobacco Use   Smoking status: Former    Current packs/day: 0.25    Types: Cigarettes   Smokeless tobacco: Never  Vaping Use   Vaping status: Never Used  Substance and Sexual Activity   Alcohol use: Yes    Alcohol/week: 2.0 standard drinks of alcohol    Types: 1 Glasses of wine, 1 Cans of beer per week   Drug use: No   Sexual activity: Not Currently    Birth control/protection: Surgical  Other Topics Concern   Not on  file  Social History Narrative   Not on file   Social Drivers of Health   Financial Resource Strain: High Risk (11/26/2023)   Overall Financial Resource Strain (CARDIA)    Difficulty of Paying Living Expenses: Very hard  Food Insecurity: Food Insecurity Present (11/26/2023)   Hunger Vital Sign    Worried About Running Out of Food in the Last Year: Often true    Ran Out of Food in the Last Year: Often true  Transportation Needs: No Transportation Needs (11/26/2023)   PRAPARE - Administrator, Civil Service (Medical): No    Lack of Transportation (Non-Medical): No  Physical Activity: Inactive (11/26/2023)   Exercise Vital Sign    Days of Exercise per Week: 0 days    Minutes of Exercise per Session: 0 min  Stress: Stress Concern Present (11/26/2023)   [employer] - Occupational Stress Questionnaire    Feeling of Stress : Rather much  Social Connections: Moderately Isolated (11/26/2023)   Social Connection and Isolation Panel [NHANES]    Frequency of Communication with Friends and Family: More than three times a week    Frequency of Social Gatherings with Friends and Family: More than three times a week    Attends Religious Services: Never    Database Administrator or Organizations: Yes    Attends Banker Meetings: 1 to 4 times per year    Marital Status: Never married    Allergies  Allergen Reactions   Amlodipine  Swelling    Lower extremity edema.   Hydrochlorothiazide  Other (See Comments)    Cough   Lamotrigine Hives   Chlorthalidone  Rash and Other (See Comments)    Cramping    Outpatient Medications Prior to Visit  Medication Sig Dispense Refill   ARIPiprazole  (ABILIFY ) 10 MG tablet Take 1 tablet (10 mg total) by mouth daily. 30 tablet 1   Cholecalciferol  (VITAMIN D ) 50 MCG (2000 UT) tablet Take 1 tablet (2,000 Units total) by mouth daily. 90 tablet 1   cyclobenzaprine  (FLEXERIL ) 10 MG tablet Take 0.5-1 tablets (5-10 mg total) by mouth 2 (two) times daily as needed for muscle spasms. 20 tablet 0   diclofenac  (VOLTAREN ) 75 MG EC tablet Take 1 tablet (75 mg total) by mouth 2 (two) times daily. 30 tablet 2   FLUoxetine  (PROZAC ) 20 MG capsule Take 1 capsule (20 mg total) by mouth daily. 30 capsule 1   furosemide  (LASIX ) 20 MG tablet TAKE 1 TABLET BY MOUTH ON MONDAY, WEDNESDAY AND FRIDAY 12 tablet 1   gabapentin  (NEURONTIN ) 300 MG capsule Take 300 mg by mouth 3 (three) times daily.     hydrOXYzine  (ATARAX ) 10 MG tablet Take 1 tablet (10 mg total) by mouth 3 (three) times daily as needed. 75 tablet 1   NIFEdipine  (PROCARDIA -XL/NIFEDICAL-XL) 30 MG 24 hr tablet Take 1 tablet (30 mg total) by mouth daily. 90 tablet 1   prazosin  (MINIPRESS ) 1 MG capsule Take 1 capsule (1 mg total) by mouth at bedtime. 30  capsule 1   predniSONE  (STERAPRED UNI-PAK 21 TAB) 10 MG (21) TBPK tablet Take as directed 21 tablet 3   Semaglutide -Weight Management (WEGOVY ) 0.5 MG/0.5ML SOAJ Inject 0.5 mg into the skin once a week. 2 mL 0   traZODone  (DESYREL ) 100 MG tablet Take 1 tablet (100 mg total) by mouth at bedtime as needed for sleep. 30 tablet 1   valsartan  (DIOVAN ) 80 MG tablet Take 1 tablet (80 mg total) by mouth daily. 90  tablet 1   albuterol  (VENTOLIN  HFA) 108 (90 Base) MCG/ACT inhaler Inhale 2 puffs into the lungs every 6 (six) hours as needed for wheezing or shortness of breath. (Patient not taking: Reported on 12/07/2023) 8 g 0   celecoxib  (CELEBREX ) 200 MG capsule Take 1 capsule (200 mg total) by mouth 2 (two) times daily. (Patient not taking: Reported on 12/07/2023) 20 capsule 0   No facility-administered medications prior to visit.     ROS Review of Systems  Constitutional:  Positive for chills. Negative for activity change and appetite change.  HENT:  Negative for sinus pressure and sore throat.   Respiratory:  Positive for cough. Negative for chest tightness, shortness of breath and wheezing.   Cardiovascular:  Negative for chest pain and palpitations.  Gastrointestinal:  Negative for abdominal distention, abdominal pain and constipation.  Genitourinary: Negative.   Musculoskeletal: Negative.   Psychiatric/Behavioral:  Negative for behavioral problems and dysphoric mood.     Objective:  BP 123/80   Pulse 89   Temp 98.5 F (36.9 C) (Oral)   Ht 5' 4 (1.626 m)   Wt 215 lb 6.4 oz (97.7 kg)   SpO2 96%   BMI 36.97 kg/m      12/07/2023    9:46 AM 11/26/2023    9:32 AM 11/26/2023    8:47 AM  BP/Weight  Systolic BP 123 126 135  Diastolic BP 80 84 93  Wt. (Lbs) 215.4  210  BMI 36.97 kg/m2  36.05 kg/m2      Physical Exam Constitutional:      Appearance: Theresa Cantrell is well-developed.  HENT:     Right Ear: There is impacted cerumen.     Left Ear: There is impacted cerumen.     Mouth/Throat:      Mouth: Mucous membranes are moist.  Cardiovascular:     Rate and Rhythm: Normal rate.     Heart sounds: Normal heart sounds. No murmur heard. Pulmonary:     Effort: Pulmonary effort is normal.     Breath sounds: Normal breath sounds. No wheezing or rales.  Chest:     Chest wall: No tenderness.  Abdominal:     General: Bowel sounds are normal. There is no distension.     Palpations: Abdomen is soft. There is no mass.     Tenderness: There is no abdominal tenderness.  Musculoskeletal:        General: Normal range of motion.     Right lower leg: No edema.     Left lower leg: No edema.  Neurological:     Mental Status: Theresa Cantrell is alert and oriented to person, place, and time.  Psychiatric:        Mood and Affect: Mood normal.        Latest Ref Rng & Units 09/17/2023   11:04 AM 05/24/2023    8:42 AM 03/28/2023   12:45 PM  CMP  Glucose 70 - 99 mg/dL 896  92  95   BUN 6 - 24 mg/dL 17  22  13    Creatinine 0.57 - 1.00 mg/dL 9.30  9.23  9.11   Sodium 134 - 144 mmol/L 145  141  140   Potassium 3.5 - 5.2 mmol/L 5.1  5.2  4.9   Chloride 96 - 106 mmol/L 109  101  102   CO2 20 - 29 mmol/L 24  25  27    Calcium 8.7 - 10.2 mg/dL 8.8  9.2  9.6   Total Protein 6.0 - 8.5 g/dL  5.5   6.8   Total Bilirubin 0.0 - 1.2 mg/dL <9.7   0.8   Alkaline Phos 44 - 121 IU/L 77   88   AST 0 - 40 IU/L 22   21   ALT 0 - 32 IU/L 17   16     Lipid Panel     Component Value Date/Time   CHOL 187 09/17/2023 1104   TRIG 105 09/17/2023 1104   HDL 71 09/17/2023 1104   CHOLHDL 2.6 09/17/2023 1104   LDLCALC 97 09/17/2023 1104    CBC    Component Value Date/Time   WBC 5.4 03/28/2023 1245   RBC 4.50 03/28/2023 1245   HGB 13.9 03/28/2023 1245   HGB 13.3 11/23/2022 1556   HCT 42.5 03/28/2023 1245   HCT 40.6 11/23/2022 1556   PLT 234 03/28/2023 1245   PLT 268 11/23/2022 1556   MCV 94.4 03/28/2023 1245   MCV 88 11/23/2022 1556   MCH 30.9 03/28/2023 1245   MCHC 32.7 03/28/2023 1245   RDW 12.8 03/28/2023 1245    RDW 13.2 11/23/2022 1556   LYMPHSABS 1.5 03/28/2023 1245   MONOABS 0.4 03/28/2023 1245   EOSABS 0.1 03/28/2023 1245   BASOSABS 0.0 03/28/2023 1245    Lab Results  Component Value Date   HGBA1C 5.9 (H) 09/17/2023    Assessment & Plan:      Upper Respiratory Infection   Persistent cough for two days with associated throat pain. No fever, runny nose, or difficulty breathing. Negative for COVID and flu.   -Continue with Prednisone  and Tessalon  perles as prescribed.   -Advise rest and hydration.   -Use Tylenol  for body aches if Theresa Cantrell develops.          Meds ordered this encounter  Medications   benzonatate  (TESSALON ) 100 MG capsule    Sig: Take 1 capsule (100 mg total) by mouth 3 (three) times daily as needed for cough.    Dispense:  21 capsule    Refill:  0   predniSONE  (DELTASONE ) 20 MG tablet    Sig: Take 1 tablet (20 mg total) by mouth daily with breakfast.    Dispense:  5 tablet    Refill:  0    Follow-up: Return for previously scheduled appointment.       Corrina Sabin, MD, FAAFP. [facility] [place], [place] [phone]   12/07/2023, 10:15 AM

## 2023-12-08 ENCOUNTER — Other Ambulatory Visit: Payer: Self-pay

## 2023-12-09 ENCOUNTER — Ambulatory Visit: Payer: MEDICAID | Admitting: Orthopaedic Surgery

## 2023-12-09 ENCOUNTER — Encounter: Payer: Self-pay | Admitting: Physical Therapy

## 2023-12-09 ENCOUNTER — Ambulatory Visit: Payer: MEDICAID | Admitting: Physical Therapy

## 2023-12-09 DIAGNOSIS — M542 Cervicalgia: Secondary | ICD-10-CM

## 2023-12-09 DIAGNOSIS — G8929 Other chronic pain: Secondary | ICD-10-CM

## 2023-12-09 DIAGNOSIS — M6281 Muscle weakness (generalized): Secondary | ICD-10-CM
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
## 2023-12-10 ENCOUNTER — Telehealth: Payer: Self-pay

## 2023-12-10 ENCOUNTER — Ambulatory Visit: Payer: MEDICAID | Admitting: Orthopaedic Surgery

## 2023-12-10 DIAGNOSIS — M1711 Unilateral primary osteoarthritis, right knee: Secondary | ICD-10-CM | POA: Diagnosis not present

## 2023-12-10 NOTE — Telephone Encounter (Signed)
Please submit for Gel inj right knee.   

## 2023-12-10 NOTE — Progress Notes (Signed)
 Office Visit Note   Patient: Theresa Cantrell           Date of Birth: 1976-08-19           MRN: [id number] Visit Date: 12/10/2023              Requested by: Vicci Barnie NOVAK, MD [address] [place],  [place] [address] PCP: Vicci Barnie NOVAK, MD   Assessment & Plan: Visit Diagnoses:  1. Primary osteoarthritis of right knee     Plan: Impression is right knee osteoarthritis.  Patient has only had temporary relief with cortisone injection.  She notes much better relief from previous viscosupplementation injection and would like to try this again.  Will request approval for this.  Follow-up once approved.  Call with concerns or questions.  This patient is diagnosed with osteoarthritis of the knee(s).    Radiographs show evidence of joint space narrowing, osteophytes, subchondral sclerosis and/or subchondral cysts.  This patient has knee pain which interferes with functional and activities of daily living.    This patient has experienced inadequate response, adverse effects and/or intolerance with conservative treatments such as acetaminophen , NSAIDS, topical creams, physical therapy or regular exercise, knee bracing and/or weight loss.   This patient has experienced inadequate response or has a contraindication to intra articular steroid injections for at least 3 months.   This patient is not scheduled to have a total knee replacement within 6 months of starting treatment with viscosupplementation.   Follow-Up Instructions: Return for f/u once approved for visco inj.   Orders:  No orders of the defined types were placed in this encounter.  No orders of the defined types were placed in this encounter.     Procedures: No procedures performed   Clinical Data: No additional findings.   Subjective: Chief Complaint  Patient presents with   Right Knee - Pain    HPI patient is a pleasant 48 year old female who comes in today with recurrent right knee pain.   History of tricompartmental osteoarthritis.  This has been ongoing for several years.  She is undergone cortisone as well as viscosupplementation injections in the past.  Cortisone injections have only provided temporary relief.  Her last cortisone injection was in early December 2020 for which only helped for about a week.  She notes prior to that she underwent gel injection which helped for about a year.  Symptoms have returned and have gradually worsened.  Pain is primarily to the lateral knee.  There are no specific aggravators.  She has associated popping, crackling and swelling.  She has been taking ibuprofen  without significant relief.  Review of Systems as detailed in HPI.  All others reviewed and are negative.   Objective: Vital Signs: There were no vitals taken for this visit.  Physical Exam well-developed well-nourished female in no acute distress.  Alert and oriented x 3.  Ortho Exam right knee exam: Moderate effusion.  Range of motion 0 to 210 degrees.  Medial joint line tenderness.  She is neurovascularly intact distally.  Specialty Comments:  MRI LUMBAR SPINE WITHOUT CONTRAST   TECHNIQUE: Multiplanar, multisequence MR imaging of the lumbar spine was performed. No intravenous contrast was administered.   COMPARISON:  Lumbar spine radiographs 01/12/2023   FINDINGS: Segmentation: There are five lumbar type vertebral bodies. The last full intervertebral disc space is labeled L5-S1. This correlates with the radiographs.   Alignment: Left convex scoliotic curvature but normal alignment in the sagittal plane.   Vertebrae:  No acute bony findings or bone lesions. Moderate endplate reactive changes at L5-S1.   Conus medullaris and cauda equina: Conus extends to the L2 level. Conus and cauda equina appear normal.   Paraspinal and other soft tissues: No significant paraspinal or retroperitoneal findings.   Disc levels:   T12-L1: No significant findings.   L1-2: No  significant findings.   L2-3: Mild annular bulge and mild facet disease with mild bilateral lateral recess encroachment but no significant spinal or foraminal stenosis.   L3-4: Bulging annulus with flattening of the ventral thecal sac in combination with mild facet disease and ligamentum flavum thickening contributing to mild bilateral lateral recess stenosis. No significant spinal or foraminal stenosis.   L4-5: Bulging annulus, moderate facet disease and ligamentum flavum thickening with mild bilateral lateral recess encroachment but no significant spinal or foraminal stenosis.   L5-S1: Shallow broad-based disc protrusion with slight mass effect on the ventral thecal sac and potential irritation of the S1 nerve roots. There is also mild left foraminal narrowing.   IMPRESSION: 1. Mild bilateral lateral recess encroachment at L2-3, L3-4 and L4-5. 2. Shallow broad-based disc protrusion at L5-S1 with slight mass effect on the ventral thecal sac and potential irritation of the S1 nerve roots. There is also mild left foraminal narrowing.     Electronically Signed   By: MYRTIS Stammer M.D.   On: 03/23/2023 13:25  Imaging: No results found.   PMFS History: Patient Active Problem List   Diagnosis Date Noted   Protrusion of lumbar intervertebral disc 03/24/2023   Depression with anxiety 11/30/2022   Partial thickness rotator cuff tear 11/16/2022   Adjustment disorder with mixed anxiety and depressed mood 02/11/2022   Rotator cuff tendinitis, right 11/14/2021   S/P cervical spinal fusion 06/17/2021   COVID-19 vaccine series not completed 12/20/2020   Influenza vaccine refused 11/07/2020   Essential hypertension 11/07/2020   Obesity (BMI 30.0-34.9) 11/07/2020   Tobacco dependence 11/07/2020   Gastroesophageal reflux disease without esophagitis 11/07/2020   History of abnormal cervical Pap smear 11/07/2020   History of bipolar disorder 11/07/2020   Bipolar 2 disorder, major  depressive episode (HCC) 06/12/2020   PTSD (post-traumatic stress disorder) 06/12/2020   Chronic pain of left knee 10/05/2019   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   Neuropathy 02/07/2014   Hot flashes 02/07/2014   Weight loss 02/07/2014   Anxiety    Past Medical History:  Diagnosis Date   Anxiety    Arthritis    Bipolar 1 disorder (HCC)    Depression    GERD (gastroesophageal reflux disease)    Hypertension    PTSD (post-traumatic stress disorder)     Family History  Problem Relation Age of Onset   Hypertension Mother    Cancer Mother        breast   Anxiety disorder Mother    Hypertension Father    Schizophrenia Father    Bipolar disorder Father    Anxiety disorder Father    Parkinson's disease Father    Colon cancer Neg Hx    Colon polyps Neg Hx    Esophageal cancer Neg Hx    Rectal cancer Neg Hx    Stomach cancer Neg Hx     Past Surgical History:  Procedure Laterality Date   ABDOMINAL HYSTERECTOMY     ANTERIOR CERVICAL DECOMP/DISCECTOMY FUSION N/A 06/06/2021   Procedure: C4-5, C5-6 ANTERIOR CERVICAL DISCECTOMY FUSION, ALLOGRAFT, PLATE;  Surgeon: Barbarann Oneil BROCKS, MD;  Location: [facility] OR;  Service: Orthopedics;  Laterality: N/A;   CHOLECYSTECTOMY     DILATION AND CURETTAGE OF UTERUS     FOOT SURGERY Bilateral    two pins in each foot   JOINT REPLACEMENT Left 2018   partial knee replacement   KNEE SURGERY Left 2018   partial knee replacement   NECK SURGERY  06/06/2021   C4-5, C5-6 ANTERIOR CERVICAL DISCECTOMY FUSION, ALLOGRAFT, PLATE   Social History   Occupational History   Not on file  Tobacco Use   Smoking status: Former    Current packs/day: 0.25    Types: Cigarettes   Smokeless tobacco: Never  Vaping Use   Vaping status: Never Used  Substance and Sexual Activity   Alcohol use: Yes    Alcohol/week: 2.0 standard drinks of alcohol    Types: 1 Glasses of wine, 1 Cans of beer per week   Drug use: No   Sexual activity: Not  Currently    Birth control/protection: Surgical

## 2023-12-14 ENCOUNTER — Ambulatory Visit (INDEPENDENT_AMBULATORY_CARE_PROVIDER_SITE_OTHER): Payer: MEDICAID | Admitting: Mental Health

## 2023-12-14 ENCOUNTER — Ambulatory Visit: Payer: MEDICAID | Admitting: Physical Therapy

## 2023-12-14 DIAGNOSIS — F3181 Bipolar II disorder: Secondary | ICD-10-CM

## 2023-12-14 DIAGNOSIS — F4310 Post-traumatic stress disorder, unspecified: Secondary | ICD-10-CM

## 2023-12-14 NOTE — Progress Notes (Signed)
THERAPIST PROGRESS NOTE Virtual Visit via Video Note  I connected with Theresa Cantrell on 12/14/23 at  3:00 PM EST by a video enabled telemedicine application and verified that I am speaking with the correct person using two identifiers.  Location: Patient: home address on file Provider: home office   I discussed the limitations of evaluation and management by telemedicine and the availability of in person appointments. The patient expressed understanding and agreed to proceed.  I discussed the assessment and treatment plan with the patient. The patient was provided an opportunity to ask questions and all were answered. The patient agreed with the plan and demonstrated an understanding of the instructions.   The patient was advised to call back or seek an in-person evaluation if the symptoms worsen or if the condition fails to improve as anticipated.  I provided 43 minutes of non-face-to-face time during this encounter.   Dorris Singh, [facility]   Session Time: 3:02 p ( 43 minutes)  Participation Level: Active  Behavioral Response: Fairly GroomedAlertEuthymic  Type of Therapy: Individual Therapy  Treatment Goals addressed: STG: [place] will increase management of moods and stressors AEB development of x 3 effective coping skills with ability to process stressors in effective manner per self report within the next 90 days   ProgressTowards Goals: Progressing  Interventions: Supportive  Summary: Theresa Cantrell is a 48 y.o. female who presents with dx of bipolar disorder and anxiety. Presents for session alert and oriented; mood and affect stable euthymic. Smiles and laughs with clinician.  Speech clear and coherent at normal rate and tone. Engaged and receptive to interventions. Shares thoughts on being bored and notes has spent high degree of time indoors as she has been sick as well as supports. Shares shoulder pain had decreased and reports for PT to have been helpful when  in the past has made pain worse. Shares back pain continues and usually is main factors in concerns for depression and low mood. Shares ongoing disability case. Shares with mood can have "good days and bad days." Shares to spend days watching tv and is receptive of exploring hobbies and ability to engage in other activities that she is able. Open to exploring options for water activities/aerobics. Denies current safety concerns.  Suicidal/Homicidal: Nowithout intent/plan  Therapist Response: Therapist engaged Brandan in tele-therapy session. Completed check in and assessed for current level of functioning, sxs management and level of stressors. Explored ability to engage coping skills and ability to navigate stressors. Provided support and encouragement; validated feelings. Engaged in exploring ability to engage in coping skills and behavioral activation to support in mood management as needed. Encouraged working to be mindful of internal dialogue and engaging I balanced thoughts and seeking support as needed. Encouraged exploring hobbies and areas of interest. Encouraged to follow up on exploring options to engage in water activities. Reviewed session and encouraged follow up.   Plan: Return again in  x 6 weeks.  Diagnosis: Bipolar 2 disorder, major depressive episode (HCC)  PTSD (post-traumatic stress disorder)  Collaboration of Care: Other None  Patient/Guardian was advised Release of Information must be obtained prior to any record release in order to collaborate their care with an outside provider. Patient/Guardian was advised if they have not already done so to contact the registration department to sign all necessary forms in order for [place] to release information regarding their care.   Consent: Patient/Guardian gives verbal consent for treatment and assignment of benefits for services provided during this visit.  Patient/Guardian expressed understanding and agreed to proceed.   Stephan Minister  [place], [facility] 12/14/2023

## 2023-12-15 ENCOUNTER — Telehealth: Payer: Self-pay

## 2023-12-15 ENCOUNTER — Encounter: Payer: Self-pay | Admitting: Physical Therapy

## 2023-12-15 ENCOUNTER — Ambulatory Visit: Payer: MEDICAID | Admitting: Physical Therapy

## 2023-12-15 DIAGNOSIS — M542 Cervicalgia: Secondary | ICD-10-CM

## 2023-12-15 DIAGNOSIS — M6281 Muscle weakness (generalized): Secondary | ICD-10-CM

## 2023-12-15 DIAGNOSIS — G8929 Other chronic pain: Secondary | ICD-10-CM

## 2023-12-15 NOTE — Telephone Encounter (Signed)
Routing to front desk

## 2023-12-15 NOTE — Telephone Encounter (Signed)
Copied from CRM [id number]. Topic: Appointments - Appointment Cancel/Reschedule >> Dec 15, 2023 12:05 PM Patsy Lager T wrote: Patient/patient representative is calling to cancel or reschedule an appointment. Refer to attachments for appointment information. Patient called stated she took her first dosage on 2/10 and need to cancel her 2/21 appt and reschedule as she will take her last dosage on 3/3. Please f/u with patient
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
## 2023-12-16 ENCOUNTER — Ambulatory Visit: Payer: MEDICAID | Admitting: Physical Therapy

## 2023-12-16 ENCOUNTER — Encounter: Payer: Self-pay | Admitting: Physical Therapy

## 2023-12-16 DIAGNOSIS — M542 Cervicalgia: Secondary | ICD-10-CM | POA: Diagnosis not present

## 2023-12-16 DIAGNOSIS — G8929 Other chronic pain: Secondary | ICD-10-CM

## 2023-12-16 DIAGNOSIS — M6281 Muscle weakness (generalized): Secondary | ICD-10-CM
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
## 2023-12-21 ENCOUNTER — Telehealth: Payer: Self-pay | Admitting: Orthopaedic Surgery

## 2023-12-21 ENCOUNTER — Ambulatory Visit: Payer: MEDICAID | Admitting: Orthopaedic Surgery

## 2023-12-21 ENCOUNTER — Encounter: Payer: Self-pay | Admitting: Orthopaedic Surgery

## 2023-12-21 VITALS — BP 108/72 | HR 98 | Ht 64.0 in | Wt 215.0 lb

## 2023-12-21 DIAGNOSIS — Z981 Arthrodesis status: Secondary | ICD-10-CM | POA: Diagnosis not present

## 2023-12-21 DIAGNOSIS — S161XXS Strain of muscle, fascia and tendon at neck level, sequela: Secondary | ICD-10-CM

## 2023-12-21 NOTE — Telephone Encounter (Signed)
 Talked with patient concerning gel injection. Once VOB has returned, I will give her a call to discuss.
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
## 2023-12-21 NOTE — Telephone Encounter (Signed)
VOB has been submitted for Monovisc, right knee.

## 2023-12-21 NOTE — Telephone Encounter (Signed)
 Patient checking the status of the gel injection approval. Pt states she let message back on 12/10/23 and still have no herd from the office with the status. Please call

## 2023-12-23 ENCOUNTER — Telehealth: Payer: MEDICAID | Admitting: Physician Assistant

## 2023-12-23 DIAGNOSIS — A6004 Herpesviral vulvovaginitis: Secondary | ICD-10-CM | POA: Diagnosis not present

## 2023-12-23 MED ORDER — VALACYCLOVIR HCL 500 MG PO TABS: ORAL_TABLET | ORAL | 0 refills | Status: DC

## 2023-12-23 NOTE — Patient Instructions (Signed)
 Theresa Cantrell, thank you for joining [name], PA-C for today's virtual visit.  While this provider is not your primary care provider (PCP), if your PCP is located in our provider database this encounter information will be shared with them immediately following your visit.   A [facility] MyChart account gives you access to today's visit and all your visits, tests, and labs performed at [facility] " click here if you don't have a [facility] MyChart account or go to [email or web address]  Consent: (Patient) Theresa Cantrell provided verbal consent for this virtual visit at the beginning of the encounter.  Current Medications:  Current Outpatient Medications:    albuterol (VENTOLIN HFA) 108 (90 Base) MCG/ACT inhaler, Inhale 2 puffs into the lungs every 6 (six) hours as needed for wheezing or shortness of breath. (Patient not taking: Reported on 12/07/2023), Disp: 8 g, Rfl: 0   ARIPiprazole (ABILIFY) 10 MG tablet, Take 1 tablet (10 mg total) by mouth daily., Disp: 30 tablet, Rfl: 1   benzonatate (TESSALON) 100 MG capsule, Take 1 capsule (100 mg total) by mouth 3 (three) times daily as needed for cough., Disp: 21 capsule, Rfl: 0   celecoxib (CELEBREX) 200 MG capsule, Take 1 capsule (200 mg total) by mouth 2 (two) times daily. (Patient not taking: Reported on 12/07/2023), Disp: 20 capsule, Rfl: 0   Cholecalciferol (VITAMIN D) 50 MCG (2000 UT) tablet, Take 1 tablet (2,000 Units total) by mouth daily., Disp: 90 tablet, Rfl: 1   cyclobenzaprine (FLEXERIL) 10 MG tablet, Take 0.5-1 tablets (5-10 mg total) by mouth 2 (two) times daily as needed for muscle spasms., Disp: 20 tablet, Rfl: 0   diclofenac (VOLTAREN) 75 MG EC tablet, Take 1 tablet (75 mg total) by mouth 2 (two) times daily., Disp: 30 tablet, Rfl: 2   FLUoxetine (PROZAC) 20 MG capsule, Take 1 capsule (20 mg total) by mouth daily., Disp: 30 capsule, Rfl: 1   furosemide (LASIX) 20 MG tablet, TAKE 1 TABLET BY MOUTH ON  MONDAY, WEDNESDAY AND FRIDAY, Disp: 12 tablet, Rfl: 1   gabapentin (NEURONTIN) 300 MG capsule, Take 300 mg by mouth 3 (three) times daily., Disp: , Rfl:    hydrOXYzine (ATARAX) 10 MG tablet, Take 1 tablet (10 mg total) by mouth 3 (three) times daily as needed., Disp: 75 tablet, Rfl: 1   NIFEdipine (PROCARDIA-XL/NIFEDICAL-XL) 30 MG 24 hr tablet, Take 1 tablet (30 mg total) by mouth daily., Disp: 90 tablet, Rfl: 1   prazosin (MINIPRESS) 1 MG capsule, Take 1 capsule (1 mg total) by mouth at bedtime., Disp: 30 capsule, Rfl: 1   predniSONE (DELTASONE) 20 MG tablet, Take 1 tablet (20 mg total) by mouth daily with breakfast., Disp: 5 tablet, Rfl: 0   predniSONE (STERAPRED UNI-PAK 21 TAB) 10 MG (21) TBPK tablet, Take as directed, Disp: 21 tablet, Rfl: 3   Semaglutide-Weight Management (WEGOVY) 0.5 MG/0.5ML SOAJ, Inject 0.5 mg into the skin once a week., Disp: 2 mL, Rfl: 0   traZODone (DESYREL) 100 MG tablet, Take 1 tablet (100 mg total) by mouth at bedtime as needed for sleep., Disp: 30 tablet, Rfl: 1   valsartan (DIOVAN) 80 MG tablet, Take 1 tablet (80 mg total) by mouth daily., Disp: 90 tablet, Rfl: 1   Medications ordered in this encounter:  No orders of the defined types were placed in this encounter.    *If you need refills on other medications prior to your next appointment, please contact your pharmacy*  Follow-Up: Call back or  seek an in-person evaluation if the symptoms worsen or if the condition fails to improve as anticipated.  [facility] [phone]  Other Instructions Genital Herpes Genital herpes is a common sexually transmitted infection (STI) that is caused by a virus. The virus spreads from person to person through contact with a sore, infected saliva, or infected skin. The virus can cause itching, blisters, and sores around the genitals or rectum. During an outbreak of infection, symptoms may last for several days and then go away. However, the virus remains in the  body, so more outbreaks may happen in the future. The time between outbreaks varies and can be from months to years. Genital herpes can affect anyone. It is particularly concerning for pregnant women because the virus can be passed to the baby during delivery. Genital herpes is also a concern for people who have a weak disease-fighting system (immune system). What are the causes? This condition is caused by the herpes simplex virus, type 1 or type 2 (HSV-1 or HSV-2). The virus may spread through: Sexual contact with an infected person, including vaginal, anal, and oral sex. Contact with a herpes sore. The skin. This means that you can get herpes from an infected partner even if there are no blisters or sores present. Your partner may not know that he or she is infected. What increases the risk? You are more likely to develop this condition if: You have sex with many partners. You do not use latex or polyurethane condoms during sex. What are the signs or symptoms? Most people do not have symptoms or they have mild symptoms that may be mistaken for other skin problems. Symptoms may include: Small, red bumps near the genitals, rectum, or mouth. These bumps turn into blisters and then sores. Flu-like (influenza-like) symptoms, including: Fever. Body aches. Swollen lymph nodes. Headache. Painful urination. Pain and itching in the genital area or rectal area. Vaginal discharge. Tingling or shooting pain in the legs and buttocks. Generally, symptoms are more severe and last longer during the first (primary) outbreak. Influenza-like symptoms are also more common during the primary outbreak. How is this diagnosed? This condition may be diagnosed based on: A physical exam. Your medical history. Blood tests. A test of a fluid sample (culture) from an open sore. How is this treated? There is no cure for this condition, but treatment with antiviral medicines can do the following: Speed up healing  and relieve symptoms. Help to reduce the spread of the virus to sexual partners. Limit the chance of future outbreaks, or make future outbreaks shorter. Lessen symptoms of future outbreaks. Your health care provider may also recommend over-the-counter medicines to help with pain and itching. Follow these instructions at home: If you have an outbreak:  Keep the affected areas dry and clean. Avoid rubbing or touching blisters and sores. If you do touch blisters or sores: Wash your hands thoroughly with soap and water for at least 20 seconds. If soap and water are not available, use an alcohol-based hand sanitizer. Do not touch your eyes afterward. Sexual activity Do not have sexual contact during active outbreaks. Practice safe sex. Herpes can spread even if your partner does not have blisters or sores. Latex or polyurethane condoms and female condoms may help prevent the spread of the herpes virus. Managing pain and discomfort If directed, put ice on the painful area. To do this: Put ice in a plastic bag. Place a towel between your skin and the bag. Leave the ice  on for 20 minutes, 2-3 times a day. Remove the ice if your skin turns bright red. This is very important. If you cannot feel pain, heat, or cold, you have a greater risk of damage to the area. If told, take a cool sitz bath to help relieve pain or itching. A sitz bath is a water bath that you take while sitting down in water that is deep enough to cover your hips and buttocks. General instructions Take over-the-counter and prescription medicines only as told by your health care provider. If you were prescribed an antiviral medicine, use it as told by your health care provider. Do not stop using the antiviral even if you start to feel better. Keep all follow-up visits. This is important. How is this prevented? Use condoms. Although you can get genital herpes during sexual contact even with the use of a condom, a condom can provide  some protection. Avoid having multiple sexual partners. Talk with your sexual partner about any symptoms either of you may have. Also, talk with your partner about any history of STIs. Do not have sexual contact if you have active symptoms of genital herpes. Contact a health care provider if: Your symptoms are not improving with medicine. Your symptoms return, or you have new symptoms. You have a fever. You have abdominal pain. You have redness, swelling, or pain in your eye. You notice new sores on other parts of your body. You have had herpes and you become pregnant or plan to become pregnant. Get help right away if: You have symptoms of viral meningitis. This is rare but may happen if the virus spreads to the brain. Symptoms may include: Severe headache or stiff neck. Muscle aches. Nausea and vomiting. Sensitivity to light. Summary Genital herpes is a common sexually transmitted infection (STI) that is caused by the herpes simplex virus, type 1 or type 2 (HSV-1 or HSV-2). These viruses are most often spread through sexual contact with an infected person. You are more likely to develop this condition if you have sex with many partners or you do not use condoms during sex. Most people do not have symptoms or have mild symptoms that may be mistaken for other skin problems. Symptoms occur as outbreaks that may happen months or years apart. There is no cure for this condition, but treatment with oral antiviral medicines can reduce symptoms, reduce the chance of spreading the virus to a partner, prevent future outbreaks, or shorten future outbreaks. This information is not intended to replace advice given to you by your health care provider. Make sure you discuss any questions you have with your health care provider. Document Revised: 07/24/2021 Document Reviewed: 07/24/2021 Elsevier Patient Education  2024 Elsevier Inc.   If you have been instructed to have an in-person evaluation today  at a local Urgent Care facility, please use the link below. It will take you to a list of all of our available [facility], including address, phone number and hours of operation. Please do not delay care.  [facility]  If you or a family member do not have a primary care provider, use the link below to schedule a visit and establish care. When you choose a [facility] primary care physician or advanced practice provider, you gain a long-term partner in health. Find a Primary Care Provider  Learn more about [facility] in-office and virtual care options: [facility] - Get Care Now

## 2023-12-23 NOTE — Progress Notes (Signed)
 Virtual Visit Consent   Theresa Cantrell, you are scheduled for a virtual visit with a [facility] provider today. Just as with appointments in the office, your consent must be obtained to participate. Your consent will be active for this visit and any virtual visit you may have with one of our providers in the next 365 days. If you have a MyChart account, a copy of this consent can be sent to you electronically.  As this is a virtual visit, video technology does not allow for your provider to perform a traditional examination. This may limit your provider's ability to fully assess your condition. If your provider identifies any concerns that need to be evaluated in person or the need to arrange testing (such as labs, EKG, etc.), we will make arrangements to do so. Although advances in technology are sophisticated, we cannot ensure that it will always work on either your end or our end. If the connection with a video visit is poor, the visit may have to be switched to a telephone visit. With either a video or telephone visit, we are not always able to ensure that we have a secure connection.  By engaging in this virtual visit, you consent to the provision of healthcare and authorize for your insurance to be billed (if applicable) for the services provided during this visit. Depending on your insurance coverage, you may receive a charge related to this service.  I need to obtain your verbal consent now. Are you willing to proceed with your visit today? Theresa Cantrell has provided verbal consent on 12/23/2023 for a virtual visit (video or telephone). Theresa Cantrell, [place]  Date: 12/23/2023 3:37 PM   Virtual Visit via Video Note   I, Theresa Cantrell, connected with  Theresa Cantrell  ([id number], [birth year]) on 12/23/23 at  3:30 PM EST by a video-enabled telemedicine application and verified that I am speaking with the correct person using two identifiers.  Location: Patient: Virtual Visit  Location Patient: Home Provider: Virtual Visit Location Provider: Home Office   I discussed the limitations of evaluation and management by telemedicine and the availability of in person appointments. The patient expressed understanding and agreed to proceed.    History of Present Illness: Theresa Cantrell is a 48 y.o. who identifies as a female who was assigned female at birth, and is being seen today for flare of her genital herpes. Endorses symptoms starting within the past couple of days. Causing pain and discomfort.  Last flare was about a month ago and she things increased stressors is a big contributor.    HPI: HPI  Problems:  Patient Active Problem List   Diagnosis Date Noted   Cervical strain, acute, sequela 12/21/2023   Protrusion of lumbar intervertebral disc 03/24/2023   Depression with anxiety 11/30/2022   Partial thickness rotator cuff tear 11/16/2022   Adjustment disorder with mixed anxiety and depressed mood 02/11/2022   Rotator cuff tendinitis, right 11/14/2021   S/P cervical spinal fusion 06/17/2021   COVID-19 vaccine series not completed 12/20/2020   Influenza vaccine refused 11/07/2020   Essential hypertension 11/07/2020   Obesity (BMI 30.0-34.9) 11/07/2020   Tobacco dependence 11/07/2020   Gastroesophageal reflux disease without esophagitis 11/07/2020   History of abnormal cervical Pap smear 11/07/2020   History of bipolar disorder 11/07/2020   Bipolar 2 disorder, major depressive episode (HCC) 06/12/2020   PTSD (post-traumatic stress disorder) 06/12/2020   Chronic pain of left knee 10/05/2019   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   Hydrochlorothiazide Other (See Comments)    Cough   Lamotrigine Hives   Chlorthalidone Rash and Other (See Comments)     Cramping   Medications:  Current Outpatient Medications:    albuterol (VENTOLIN HFA) 108 (90 Base) MCG/ACT inhaler, Inhale 2 puffs into the lungs every 6 (six) hours as needed for wheezing or shortness of breath. (Patient not taking: Reported on 12/07/2023), Disp: 8 g, Rfl: 0   ARIPiprazole (ABILIFY) 10 MG tablet, Take 1 tablet (10 mg total) by mouth daily., Disp: 30 tablet, Rfl: 1   benzonatate (TESSALON) 100 MG capsule, Take 1 capsule (100 mg total) by mouth 3 (three) times daily as needed for cough., Disp: 21 capsule, Rfl: 0   celecoxib (CELEBREX) 200 MG capsule, Take 1 capsule (200 mg total) by mouth 2 (two) times daily. (Patient not taking: Reported on 12/07/2023), Disp: 20 capsule, Rfl: 0   Cholecalciferol (VITAMIN D) 50 MCG (2000 UT) tablet, Take 1 tablet (2,000 Units total) by mouth daily., Disp: 90 tablet, Rfl: 1   cyclobenzaprine (FLEXERIL) 10 MG tablet, Take 0.5-1 tablets (5-10 mg total) by mouth 2 (two) times daily as needed for muscle spasms., Disp: 20 tablet, Rfl: 0   diclofenac (VOLTAREN) 75 MG EC tablet, Take 1 tablet (75 mg total) by mouth 2 (two) times daily., Disp: 30 tablet, Rfl: 2   FLUoxetine (PROZAC) 20 MG capsule, Take 1 capsule (20 mg total) by mouth daily., Disp: 30 capsule, Rfl: 1   furosemide (LASIX) 20 MG tablet, TAKE 1 TABLET BY MOUTH ON MONDAY, WEDNESDAY AND FRIDAY, Disp: 12 tablet, Rfl: 1   gabapentin (NEURONTIN) 300 MG capsule, Take 300 mg by mouth 3 (three) times daily., Disp: , Rfl:    hydrOXYzine (ATARAX) 10 MG tablet, Take 1 tablet (10 mg total) by mouth 3 (three) times daily as needed., Disp: 75 tablet, Rfl: 1   NIFEdipine (PROCARDIA-XL/NIFEDICAL-XL) 30 MG 24 hr tablet, Take 1 tablet (30 mg total) by mouth daily., Disp: 90 tablet, Rfl: 1   prazosin (MINIPRESS) 1 MG capsule, Take 1 capsule (1 mg total) by mouth at bedtime., Disp: 30 capsule, Rfl: 1   predniSONE (STERAPRED UNI-PAK 21 TAB) 10 MG (21) TBPK tablet, Take as directed, Disp: 21 tablet, Rfl: 3    Semaglutide-Weight Management (WEGOVY) 0.5 MG/0.5ML SOAJ, Inject 0.5 mg into the skin once a week., Disp: 2 mL, Rfl: 0   traZODone (DESYREL) 100 MG tablet, Take 1 tablet (100 mg total) by mouth at bedtime as needed for sleep., Disp: 30 tablet, Rfl: 1   valsartan (DIOVAN) 80 MG tablet, Take 1 tablet (80 mg total) by mouth daily., Disp: 90 tablet, Rfl: 1  Observations/Objective: Patient is well-developed, well-nourished in no acute distress.  Resting comfortably  at home.  Head is normocephalic, atraumatic.  No labored breathing.  Speech is clear and coherent with logical content.  Patient is alert and oriented at baseline.   Assessment and Plan: 1. Herpes simplex vulvovaginitis (Primary)  Giving increased frequency of outbreaks, will not only start a course of Valtrex for acute flare, but will have her start once daily 500 mg Valtrex after course is complete as a preventive until she can follow-up with her PCP for further discussion.  Follow Up Instructions: I discussed the assessment  and treatment plan with the patient. The patient was provided an opportunity to ask questions and all were answered. The patient agreed with the plan and demonstrated an understanding of the instructions.  A copy of instructions were sent to the patient via MyChart unless otherwise noted below.   The patient was advised to call back or seek an in-person evaluation if the symptoms worsen or if the condition fails to improve as anticipated.    Theresa Climes, PA-C

## 2023-12-24 ENCOUNTER — Ambulatory Visit: Payer: Self-pay | Admitting: Pharmacist

## 2023-12-29 ENCOUNTER — Ambulatory Visit: Payer: MEDICAID | Admitting: Orthopaedic Surgery

## 2023-12-29 VITALS — Ht 64.0 in | Wt 215.0 lb

## 2023-12-29 DIAGNOSIS — M1711 Unilateral primary osteoarthritis, right knee: Secondary | ICD-10-CM | POA: Diagnosis not present

## 2023-12-29 NOTE — Progress Notes (Signed)
 The patient has been seeing me but also my partner Dr.Xu for her knees.  She has a remote history of a patellofemoral replacement and ligament reconstruction on the left knee.  She has known significant arthritis in her right knee as well as arthritis in the weightbearing surface of the left knee.  She does ambulate with a cane.  She is in pain management.  They have just sent in hydrocodone for her.  She also takes diclofenac, Neurontin and Flexeril.  She is obese and her latest BMI was 36.9.  Dr. Roda Shutters has ordered viscosupplementation for her but her insurance does not cover that.  Steroid injections have no longer been effective for her and only last just a few days.  Both knees have slight varus malalignment that is correctable with medial joint line tenderness.  Both knees have good range of motion.  We talked in length in detail about weight loss which could certainly help take pressure off her knees.  Knee sleeves ordered online such as copper fit could provide some compression.  The only other surgical options would be knee replacements.  She says she is not ready for that.  I agree with as well given her young age of 13.  She needs to continue work on weight loss and activity modification.  If he gets to the point where all else fails we can certainly see her back to consider knee replacement surgery.  She can see either me or my partner Dr. Roda Shutters.

## 2023-12-30 ENCOUNTER — Encounter: Payer: Self-pay | Admitting: Internal Medicine

## 2023-12-30 ENCOUNTER — Encounter (HOSPITAL_COMMUNITY): Payer: Self-pay | Admitting: Physician Assistant

## 2023-12-30 ENCOUNTER — Other Ambulatory Visit: Payer: Self-pay | Admitting: Internal Medicine

## 2023-12-30 ENCOUNTER — Telehealth (INDEPENDENT_AMBULATORY_CARE_PROVIDER_SITE_OTHER): Payer: MEDICAID | Admitting: Physician Assistant

## 2023-12-30 DIAGNOSIS — F3181 Bipolar II disorder: Secondary | ICD-10-CM

## 2023-12-30 DIAGNOSIS — F4310 Post-traumatic stress disorder, unspecified: Secondary | ICD-10-CM | POA: Diagnosis not present

## 2023-12-30 DIAGNOSIS — F419 Anxiety disorder, unspecified: Secondary | ICD-10-CM

## 2023-12-30 MED ORDER — FLUOXETINE HCL 20 MG PO CAPS: 20.0000 mg | ORAL_CAPSULE | Freq: Every day | ORAL | 1 refills | Status: DC

## 2023-12-30 MED ORDER — HYDROXYZINE HCL 10 MG PO TABS: 10.0000 mg | ORAL_TABLET | Freq: Three times a day (TID) | ORAL | 1 refills | Status: DC | PRN

## 2023-12-30 MED ORDER — WEGOVY 1 MG/0.5ML ~~LOC~~ SOAJ
1.0000 mg | SUBCUTANEOUS | 0 refills | Status: DC
Start: 2023-12-30 — End: 2024-02-01
  Filled 2023-12-30: qty 2, 28d supply, fill #0

## 2023-12-30 MED ORDER — TRAZODONE HCL 150 MG PO TABS: 150.0000 mg | ORAL_TABLET | Freq: Every evening | ORAL | 1 refills | Status: DC | PRN

## 2023-12-30 MED ORDER — ARIPIPRAZOLE 15 MG PO TABS: 15.0000 mg | ORAL_TABLET | Freq: Every day | ORAL | 1 refills | Status: DC

## 2023-12-30 MED ORDER — PRAZOSIN HCL 2 MG PO CAPS: 2.0000 mg | ORAL_CAPSULE | Freq: Every day | ORAL | 1 refills | Status: DC
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
## 2023-12-31 ENCOUNTER — Other Ambulatory Visit: Payer: Self-pay

## 2023-12-31 ENCOUNTER — Encounter: Payer: Self-pay | Admitting: Internal Medicine

## 2023-12-31 ENCOUNTER — Telehealth (HOSPITAL_BASED_OUTPATIENT_CLINIC_OR_DEPARTMENT_OTHER): Payer: MEDICAID | Admitting: Internal Medicine

## 2023-12-31 DIAGNOSIS — M545 Low back pain, unspecified: Secondary | ICD-10-CM

## 2023-12-31 DIAGNOSIS — M51369 Other intervertebral disc degeneration, lumbar region without mention of lumbar back pain or lower extremity pain: Secondary | ICD-10-CM | POA: Diagnosis not present

## 2023-12-31 DIAGNOSIS — R269 Unspecified abnormalities of gait and mobility: Secondary | ICD-10-CM | POA: Diagnosis not present

## 2023-12-31 DIAGNOSIS — M170 Bilateral primary osteoarthritis of knee: Secondary | ICD-10-CM

## 2023-12-31 DIAGNOSIS — G8929 Other chronic pain: Secondary | ICD-10-CM
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
## 2024-01-03 ENCOUNTER — Telehealth: Payer: Self-pay

## 2024-01-03 NOTE — Telephone Encounter (Signed)
 Copied from CRM [id number]. Topic: General - Other >> Jan 03, 2024  1:21 PM Antony Haste wrote: Reason for CRM: Patient wanted to make sure her PCP is aware that  her insurance will not cover her rotator chair to be sent through [facility], she would like this order request sent to [facility] instead. Patient provided [facility]'s fax number: [phone]

## 2024-01-05 NOTE — Telephone Encounter (Signed)
 Noted. Order for rollator / walker has been successfully placed to [facility] on 01/05/2024.

## 2024-01-10 ENCOUNTER — Ambulatory Visit: Payer: MEDICAID | Admitting: Skilled Nursing Facility1

## 2024-01-11 ENCOUNTER — Encounter (HOSPITAL_COMMUNITY): Payer: Self-pay

## 2024-01-11 ENCOUNTER — Ambulatory Visit (HOSPITAL_COMMUNITY): Payer: MEDICAID | Admitting: Mental Health

## 2024-01-11 DIAGNOSIS — F4310 Post-traumatic stress disorder, unspecified: Secondary | ICD-10-CM

## 2024-01-11 DIAGNOSIS — F3181 Bipolar II disorder: Secondary | ICD-10-CM

## 2024-01-11 NOTE — Progress Notes (Unsigned)
 THERAPIST PROGRESS NOTE Virtual Visit via Video Note  I connected with [name] on 01/11/24 at  4:00 PM EDT by a video enabled telemedicine application and verified that I am speaking with the correct person using two identifiers.  Location: Patient: home address on file Provider: office   I discussed the limitations of evaluation and management by telemedicine and the availability of in person appointments. The patient expressed understanding and agreed to proceed.  I discussed the assessment and treatment plan with the patient. The patient was provided an opportunity to ask questions and all were answered. The patient agreed with the plan and demonstrated an understanding of the instructions.   The patient was advised to call back or seek an in-person evaluation if the symptoms worsen or if the condition fails to improve as anticipated.  I provided 35 minutes of non-face-to-face time during this encounter.   Dorris Singh, [facility]   Session Time: 4:02 pm ( 35 minutes)   Participation Level: Active  Behavioral Response: CasualAlertEuthymic  Type of Therapy: Individual Therapy  Treatment Goals addressed: STG: Shantinique will increase management of moods and stressors AEB development of x 3 effective coping skills with ability to process stressors in effective manner per self report within the next 90 days   ProgressTowards Goals: Progressing  Interventions: Supportive  Summary: JUNIPER COBEY is a 48 y.o. female who presents with dx of bipolar disorder and anxiety. Presents for session alert and oriented; mood and affect stable euthymic. Speech clear and coherent at normal rate and tone. Engaged and receptive to interventions. Shares has been able to obtain puzzles and crosswords to support in coping with current stress. Notes improvement in mood with decreased depression. Shares deceased pain in shoulder with PT but continues to have high degree of pain related to  knee and back. Shares thoughts of decrease in independence and mobility. Notes to have support with family and friends and now has an aide that comes in x 2 hours a day; thought would like more. Shares hopeful of ability to obtain walker with chair as that can increase ability to engage in community at higher rate. Shares thoughts of staying home outside of attending medical appointments and spends high degree of time in bed and shares barrier of being in living room. Explored with thearpist things in which she can still do independently and ongoing exploration of adjusting to medical concerns. Denies safety concerns.   Suicidal/Homicidal: Nowithout intent/plan  Therapist Response: Therapist engaged [place] in tele-therapy session. Completed check in and assessed for current level of functioning, sxs management and level of stressors. Explored ability to engage coping skills and ability to navigate stressors. Provided support and encouragement. Explored with Myanna facts that have supported in reduced sxs of depression and increased coping. Affirmed ability to explore additional interest. Supported in exploring ability to increase engagement in community and maintain quality of life. Encouraged ongoing helpful thinking, engagement in things she enjoys and community as able. Reviewed session and provided follow up.   Plan: Return again in  x 6 weeks.  Diagnosis: Bipolar 2 disorder, major depressive episode (HCC)  PTSD (post-traumatic stress disorder)  Collaboration of Care: Other None  Patient/Guardian was advised Release of Information must be obtained prior to any record release in order to collaborate their care with an outside provider. Patient/Guardian was advised if they have not already done so to contact the registration department to sign all necessary forms in order for [place] to release information regarding  their care.   Consent: Patient/Guardian gives verbal consent for treatment and  assignment of benefits for services provided during this visit. Patient/Guardian expressed understanding and agreed to proceed.   Stephan Minister [place], [facility] 01/11/2024

## 2024-01-17 ENCOUNTER — Other Ambulatory Visit: Payer: Self-pay

## 2024-01-17 ENCOUNTER — Encounter: Payer: Self-pay | Admitting: Internal Medicine

## 2024-01-17 ENCOUNTER — Ambulatory Visit: Payer: MEDICAID | Attending: Internal Medicine | Admitting: Internal Medicine

## 2024-01-17 VITALS — BP 120/82 | HR 91 | Temp 98.2°F | Ht 64.0 in | Wt 218.0 lb

## 2024-01-17 DIAGNOSIS — E66812 Obesity, class 2: Secondary | ICD-10-CM | POA: Diagnosis not present

## 2024-01-17 DIAGNOSIS — I10 Essential (primary) hypertension: Secondary | ICD-10-CM | POA: Diagnosis not present

## 2024-01-17 DIAGNOSIS — G8929 Other chronic pain: Secondary | ICD-10-CM

## 2024-01-17 DIAGNOSIS — M545 Low back pain, unspecified: Secondary | ICD-10-CM

## 2024-01-17 DIAGNOSIS — A6004 Herpesviral vulvovaginitis: Secondary | ICD-10-CM | POA: Diagnosis not present

## 2024-01-17 DIAGNOSIS — K2100 Gastro-esophageal reflux disease with esophagitis, without bleeding: Secondary | ICD-10-CM

## 2024-01-17 DIAGNOSIS — Z6837 Body mass index (BMI) 37.0-37.9, adult: Secondary | ICD-10-CM

## 2024-01-17 MED ORDER — OMEPRAZOLE 20 MG PO CPDR
20.0000 mg | DELAYED_RELEASE_CAPSULE | Freq: Two times a day (BID) | ORAL | 3 refills | Status: DC
Start: 2024-01-17 — End: 2024-06-03
  Filled 2024-01-17: qty 60, 30d supply, fill #0
  Filled 2024-02-12 – 2024-02-22 (×2): qty 60, 30d supply, fill #1
  Filled 2024-03-26: qty 60, 30d supply, fill #2
  Filled 2024-05-02: qty 60, 30d supply, fill #3

## 2024-01-17 MED ORDER — VALACYCLOVIR HCL 1 G PO TABS
1000.0000 mg | ORAL_TABLET | Freq: Every day | ORAL | 1 refills | Status: DC
Start: 2024-01-17 — End: 2024-08-13
  Filled 2024-01-17: qty 30, 30d supply, fill #0
  Filled 2024-02-12 – 2024-02-26 (×2): qty 30, 30d supply, fill #1
  Filled 2024-03-29: qty 30, 30d supply, fill #2
  Filled 2024-04-27: qty 30, 30d supply, fill #3
  Filled 2024-06-03: qty 30, 30d supply, fill #4
  Filled 2024-07-07: qty 30, 30d supply, fill #5
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
## 2024-01-17 NOTE — Patient Instructions (Signed)
 GERD in Adults: Diet Changes When you have gastroesophageal reflux disease (GERD), you may need to make changes to your diet. Choosing the right foods can help with your symptoms. Think about working with an expert in healthy eating called a dietitian. They can help you make healthy food choices. What are tips for following this plan? Reading food labels Look for foods that are low in saturated fat. Foods that may help with your symptoms include: Foods with less than 5% of daily value (DV) of fat. Foods with 0 grams of trans fat. Cooking [employer] in ways that don't use a lot of fat. These ways include: Baking. Steaming. Grilling. Broiling. To add flavor, try to use herbs that are low in spice and acidity. Avoid frying your food. Meal planning  Eat small meals often rather than eating 3 large meals each day. Eat your meals slowly in a place where you feel relaxed. If told by your health care provider, avoid: Foods that cause symptoms. Keep a food diary to keep track of foods that cause symptoms. Alcohol. Drinking a lot of liquid with meals. General instructions For 2-3 hours after you eat, avoid: Bending over. Exercise. Lying down. Chew sugar-free gum after meals. What foods should I eat? Eat a healthy diet. Try to include: Foods with high amounts of fiber. These include: Fruits and vegetables. Whole grains and beans. Low-fat dairy products. Lean meats, fish, and poultry. Egg whites. Foods that cause symptoms in someone else may not cause symptoms for you. Work with your provider to find foods that are safe for you. The items listed above may not be all the foods and drinks you can have. Talk with a dietitian to learn more. The items listed above may not be a complete list of foods and beverages you can eat and drink. Contact a dietitian for more information. What foods should I avoid? Limiting some of these foods may help with your symptoms. Each person is different.  Talk with a dietitian or your provider to help you find the exact foods to avoid. Some of the foods to avoid may include: Fruits Fruits with a lot of acid in them. These may include citrus fruits, such as oranges, grapefruit, pineapple, and lemons. Vegetables Deep-fried vegetables, such as [place] fries. Vegetables, sauces, or toppings made with added fat and vegetables with acid in them. These may include tomatoes and tomato products, chili peppers, onions, garlic, and horseradish. Grains Pastries or quick breads with added fat. Meats and other proteins High-fat meats, such as fatty beef or pork, hot dogs, ribs, ham, sausage, salami, and bacon. Fried meat or protein, such as fried fish and fried chicken. Egg yolks. Fats and oils Butter. Margarine. Shortening. Ghee. Drinks Coffee and other drinks with caffeine in them. Fizzy and sugary drinks, such as soda and energy drinks. Fruit juice made with acidic fruits, such as orange or grapefruit. Tomato juice. Sweets and desserts Chocolate and cocoa. Donuts. Seasonings and condiments Mint, such as peppermint and spearmint. Condiments, herbs, or seasonings that cause symptoms. These may include curry, hot sauce, or vinegar-based salad dressings. The items listed above may not be all the foods and drinks you should avoid. Talk with a dietitian to learn more. Questions to ask your health care provider Changes to your diet and everyday life are often the first steps taken to manage symptoms of GERD. If these changes don't help, talk with your provider about taking medicines. Where to find more information [employer] for Gastrointestinal Disorders:  aboutgerd.org This information is not intended to replace advice given to you by your health care provider. Make sure you discuss any questions you have with your health care provider. Document Revised: 08/31/2023 Document Reviewed: 03/17/2023 Elsevier Patient Education  2024 [employer].

## 2024-01-18 ENCOUNTER — Other Ambulatory Visit: Payer: Self-pay

## 2024-01-21 ENCOUNTER — Ambulatory Visit: Payer: MEDICAID | Admitting: Dietician

## 2024-01-25 ENCOUNTER — Ambulatory Visit: Payer: MEDICAID | Admitting: Pharmacist

## 2024-01-26 LAB — HM MAMMOGRAPHY

## 2024-01-27 ENCOUNTER — Encounter: Payer: Self-pay | Admitting: Internal Medicine

## 2024-01-31 ENCOUNTER — Encounter: Payer: Self-pay | Admitting: Internal Medicine

## 2024-02-01 ENCOUNTER — Other Ambulatory Visit: Payer: Self-pay | Admitting: Family Medicine

## 2024-02-01 ENCOUNTER — Other Ambulatory Visit: Payer: Self-pay

## 2024-02-01 MED ORDER — WEGOVY 1.7 MG/0.75ML ~~LOC~~ SOAJ
1.7000 mg | SUBCUTANEOUS | 0 refills | Status: DC
  Filled 2024-02-01: qty 3, 28d supply, fill #0

## 2024-02-04 ENCOUNTER — Ambulatory Visit (INDEPENDENT_AMBULATORY_CARE_PROVIDER_SITE_OTHER): Payer: MEDICAID | Admitting: Mental Health

## 2024-02-04 ENCOUNTER — Other Ambulatory Visit: Payer: Self-pay | Admitting: Internal Medicine

## 2024-02-04 DIAGNOSIS — F4310 Post-traumatic stress disorder, unspecified: Secondary | ICD-10-CM | POA: Diagnosis not present

## 2024-02-04 DIAGNOSIS — F3181 Bipolar II disorder: Secondary | ICD-10-CM | POA: Diagnosis not present

## 2024-02-04 DIAGNOSIS — I10 Essential (primary) hypertension: Secondary | ICD-10-CM
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
## 2024-02-08 ENCOUNTER — Other Ambulatory Visit (HOSPITAL_COMMUNITY): Payer: Self-pay

## 2024-02-09 ENCOUNTER — Other Ambulatory Visit (HOSPITAL_COMMUNITY): Payer: Self-pay
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
## 2024-02-11 ENCOUNTER — Encounter: Payer: MEDICAID | Attending: Internal Medicine | Admitting: Dietician

## 2024-02-11 VITALS — Wt 217.4 lb

## 2024-02-11 DIAGNOSIS — Z6835 Body mass index (BMI) 35.0-35.9, adult: Secondary | ICD-10-CM | POA: Insufficient documentation

## 2024-02-11 DIAGNOSIS — E66812 Obesity, class 2: Secondary | ICD-10-CM | POA: Insufficient documentation

## 2024-02-14 ENCOUNTER — Other Ambulatory Visit: Payer: Self-pay

## 2024-02-17 ENCOUNTER — Other Ambulatory Visit: Payer: Self-pay

## 2024-02-22 ENCOUNTER — Other Ambulatory Visit: Payer: Self-pay

## 2024-02-22 ENCOUNTER — Encounter: Payer: Self-pay | Admitting: Pharmacist

## 2024-02-22 ENCOUNTER — Other Ambulatory Visit (HOSPITAL_COMMUNITY): Payer: Self-pay

## 2024-02-22 ENCOUNTER — Ambulatory Visit: Payer: MEDICAID | Attending: Internal Medicine | Admitting: Pharmacist

## 2024-02-22 DIAGNOSIS — Z6837 Body mass index (BMI) 37.0-37.9, adult: Secondary | ICD-10-CM | POA: Diagnosis not present

## 2024-02-22 DIAGNOSIS — E66812 Obesity, class 2: Secondary | ICD-10-CM | POA: Diagnosis not present

## 2024-02-22 MED ORDER — WEGOVY 2.4 MG/0.75ML ~~LOC~~ SOAJ
2.4000 mg | SUBCUTANEOUS | 1 refills | Status: DC
Start: 2024-02-22 — End: 2024-09-08
  Filled 2024-02-22: qty 9, fill #0
  Filled 2024-03-29: qty 3, 28d supply, fill #0
  Filled 2024-04-24: qty 3, 28d supply, fill #1
  Filled 2024-05-22: qty 3, 28d supply, fill #2
  Filled 2024-06-18: qty 3, 28d supply, fill #3
  Filled 2024-07-19: qty 3, 28d supply, fill #4
  Filled 2024-08-31 – 2024-09-01 (×2): qty 3, 28d supply, fill #5

## 2024-02-22 MED ORDER — WEGOVY 1.7 MG/0.75ML ~~LOC~~ SOAJ
1.7000 mg | SUBCUTANEOUS | 0 refills | Status: DC
  Filled 2024-02-22 – 2024-02-24 (×3): qty 3, 28d supply, fill #0

## 2024-02-22 NOTE — Progress Notes (Signed)
   02/22/2024 Name: ERINNE GILLENTINE MRN: [id number] DOB: 09/10/1976  No chief complaint on file.   ZURISADAI HELMINIAK is a 48 y.o. year old female who was referred for medication management by their primary care provider, Lawrance Presume, MD. They presented for a face to face visit today.   They were referred to the pharmacist by their PCP for assistance in managing weight management. She is currently on Wegovy  1.7 mg weekly. Has taken 3 doses so far. No NV, abdominal pain. She does endorse belching more so this week with her 3rd dose. Denies any pain, acid reflux symptoms.    Subjective:  Care Team: Primary Care Provider: Lawrance Presume, MD ; Next Scheduled Visit: 02/24/2024  Medication Access/Adherence  Current Pharmacy:  [facility] [address] [place] [address] Phone: [phone] Fax: [phone]  [facility] [address] [place] - [address] [place] [address] Phone: [phone] Fax: [phone]   Patient reports affordability concerns with their medications: No  Patient reports access/transportation concerns to their pharmacy: No  Patient reports adherence concerns with their medications:  No     Obesity/Overweight:   Current medications: Wegovy  1.7 mg weekly   Current meal patterns:  -Can tell a decrease in appetite since increasing dose of Wegovy  to 1.7 mg weekly.    Objective:  Lab Results  Component Value Date   HGBA1C 5.9 (H) 09/17/2023    Lab Results  Component Value Date   CREATININE 0.69 09/17/2023   BUN 17 09/17/2023   NA 145 (H) 09/17/2023   K 5.1 09/17/2023   CL 109 (H) 09/17/2023   CO2 24 09/17/2023    Lab Results  Component Value Date   CHOL 187 09/17/2023   HDL 71 09/17/2023   LDLCALC 97 09/17/2023   TRIG 105 09/17/2023   CHOLHDL 2.6 09/17/2023    Medications Reviewed Today   Medications were not reviewed in  this encounter       Assessment/Plan:   Obesity/Overweight: - Currently tolerating Wegovy  okay. I recommend to continue 1.7 mg weekly for another month to allow her system more time to get used to the medication. After 1 more month of the 1.7 mg dose, we can increase to 2.4 mg weekly if no additional GI side effects.  - Extensive dietary counseling including education on focus on lean proteins, fruits and vegetables, whole grains and increased fiber consumption, adequate hydration - Extensive exercise counseling including eventual goal of 150 minutes of moderate intensity exercise weekly - Provided motivational interviewing. Discussed setting non-weight based goals  Follow Up Plan: Dr. Lincoln Renshaw later this week.   Marene Shape, PharmD, Becky Bowels, CPP Clinical Pharmacist [facility] [phone]

## 2024-02-24 ENCOUNTER — Other Ambulatory Visit (HOSPITAL_BASED_OUTPATIENT_CLINIC_OR_DEPARTMENT_OTHER): Payer: Self-pay

## 2024-02-24 ENCOUNTER — Other Ambulatory Visit: Payer: Self-pay

## 2024-02-24 ENCOUNTER — Encounter: Payer: Self-pay | Admitting: Internal Medicine

## 2024-02-24 ENCOUNTER — Other Ambulatory Visit (HOSPITAL_COMMUNITY): Payer: Self-pay

## 2024-02-24 ENCOUNTER — Ambulatory Visit: Payer: MEDICAID | Attending: Internal Medicine | Admitting: Internal Medicine

## 2024-02-24 VITALS — BP 107/72 | HR 97 | Temp 97.6°F | Ht 64.0 in | Wt 213.0 lb

## 2024-02-24 DIAGNOSIS — I10 Essential (primary) hypertension: Secondary | ICD-10-CM

## 2024-02-24 DIAGNOSIS — E66812 Obesity, class 2: Secondary | ICD-10-CM | POA: Diagnosis not present

## 2024-02-24 DIAGNOSIS — Z6836 Body mass index (BMI) 36.0-36.9, adult: Secondary | ICD-10-CM | POA: Diagnosis not present

## 2024-02-24 DIAGNOSIS — F319 Bipolar disorder, unspecified: Secondary | ICD-10-CM

## 2024-02-24 DIAGNOSIS — K219 Gastro-esophageal reflux disease without esophagitis: Secondary | ICD-10-CM | POA: Diagnosis not present

## 2024-02-24 DIAGNOSIS — M545 Low back pain, unspecified: Secondary | ICD-10-CM

## 2024-02-24 DIAGNOSIS — G8929 Other chronic pain: Secondary | ICD-10-CM

## 2024-02-24 DIAGNOSIS — R682 Dry mouth, unspecified: Secondary | ICD-10-CM
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
## 2024-02-24 NOTE — Patient Instructions (Signed)
 VISIT SUMMARY:  During your follow-up visit, we discussed your progress with weight loss, hypertension, acid reflux, chronic back pain, and depression. You have lost 5 pounds since starting Wegovy  and have made positive dietary changes. Your blood pressure is well-controlled, and your acid reflux symptoms have improved. We also reviewed your chronic back pain and depression management.  YOUR PLAN:  -CHRONIC BACK PAIN WITH ARTHRITIS: Chronic back pain with arthritis means you have ongoing pain in your back due to inflammation and arthritis. We will proceed with your scheduled radiofrequency ablation (RFA) on May 5th and encourage you to establish care with a new spine specialist.  -DEPRESSION: Depression is a mental health condition that affects your mood and daily activities. You are stable on your current medications (Prozac , Abilify , and prazosin ) and should continue them. Please maintain your follow-up with behavioral health.  -HYPERTENSION: Hypertension is high blood pressure. Your blood pressure is well-controlled with valsartan  and nifedipine . Continue taking these medications, monitor your blood pressure at home, and limit your salt intake.  -OBESITY: Obesity is a condition where you have excess body weight. You have lost 5 pounds on Wegovy  and are making good dietary changes. Continue taking Wegovy  1.7 mg for another month, and then we will increase the dose to 2.4 mg. Keep up with your dietary changes and follow up with your nutritionist.  -GASTROESOPHAGEAL REFLUX DISEASE (GERD): GERD is a condition where stomach acid frequently flows back into the tube connecting your mouth and stomach. Your symptoms have improved with omeprazole . Continue taking omeprazole  20 mg twice daily and avoid trigger foods.  -DRY MOUTH: Dry mouth can be caused by medications or using a CPAP machine. Try sugar-free candies to stimulate saliva production and drink more water. Abilify  may contribute to your dry mouth  symptoms.  INSTRUCTIONS:  Proceed with your scheduled radiofrequency ablation (RFA) on May 5th. Continue monitoring your blood pressure at home and follow up with your nutritionist. Establish care with a new spine specialist.

## 2024-02-28 ENCOUNTER — Encounter: Payer: Self-pay | Admitting: Pharmacist

## 2024-02-28 ENCOUNTER — Other Ambulatory Visit: Payer: Self-pay

## 2024-02-28 ENCOUNTER — Other Ambulatory Visit (HOSPITAL_COMMUNITY): Payer: Self-pay

## 2024-03-06 ENCOUNTER — Ambulatory Visit (INDEPENDENT_AMBULATORY_CARE_PROVIDER_SITE_OTHER): Payer: MEDICAID | Admitting: Mental Health

## 2024-03-06 DIAGNOSIS — F3181 Bipolar II disorder: Secondary | ICD-10-CM | POA: Diagnosis not present

## 2024-03-06 DIAGNOSIS — F419 Anxiety disorder, unspecified: Secondary | ICD-10-CM

## 2024-03-06 DIAGNOSIS — F4310 Post-traumatic stress disorder, unspecified: Secondary | ICD-10-CM | POA: Diagnosis not present

## 2024-03-06 NOTE — Progress Notes (Signed)
   THERAPIST PROGRESS NOTE Virtual Visit via Video Note  I connected with Theresa Cantrell on 03/06/24 at  3:00 PM EDT by a video enabled telemedicine application and verified that I am speaking with the correct person using two identifiers.  Location: Patient: home address on file Provider: home office   I discussed the limitations of evaluation and management by telemedicine and the availability of in person appointments. The patient expressed understanding and agreed to proceed.  I discussed the assessment and treatment plan with the patient. The patient was provided an opportunity to ask questions and all were answered. The patient agreed with the plan and demonstrated an understanding of the instructions.   The patient was advised to call back or seek an in-person evaluation if the symptoms worsen or if the condition fails to improve as anticipated.  I provided 35 minutes of non-face-to-face time during this encounter.   Loman Risk, [facility]   Session Time: 3:03 pm   Participation Level: Active  Behavioral Response: CasualAlertWNL  Type of Therapy: Individual Therapy  Treatment Goals addressed: STG: Theresa Cantrell will increase management of moods and stressors AEB development of x 3 effective coping skills with ability to process stressors in effective manner per self report within the next 90 days   ProgressTowards Goals: Progressing  Interventions: Supportive  Summary: Theresa Cantrell is a 48 y.o. female who presents with dx of bipolar disorder and anxiety. Presents for session alert and oriented; mood and affect stable; adequate; ;in good spirits. Speech clear and coherent at normal rate and tone. Engaged and receptive to interventions. Shares for moods to be stable denies excessive highs or lows. Notes recent low moods with finding out disability was denied. Shares has been able to put in appeal with lawyers and has processed this. Shares to be getting out of the house as  able supporting in mood stability. Shares ongoing difficulty with pain and has recently had procedure to support in decreasing pain. Shares stressors related to finances with no income and receiving support through family and friends. Notes copes with going out side, spending time with friends and doing puzzles. Progress with goals; denies safety concerns.   Suicidal/Homicidal: Nowithout intent/plan  Therapist Response: Therapist engaged Theresa Cantrell in tele-therapy session. Completed check in and assessed for current level of functioning, sxs management and level of stressors. Explored ability to engage coping skills and ability to navigate stressors. Provided support and encouragement. Explored with Theresa Cantrell feelings of hope with new procedure to reduce  pain and increase quality of life and independence. Reviewed previous GAD and PHQ scores on  02/24/24 by PCP office. Reviewed session and provided follow up.   Plan: Return again in  x 6 weeks.  Diagnosis: Bipolar 2 disorder, major depressive episode (HCC)  PTSD (post-traumatic stress disorder)  Anxiety  Collaboration of Care: Other None  Patient/Guardian was advised Release of Information must be obtained prior to any record release in order to collaborate their care with an outside provider. Patient/Guardian was advised if they have not already done so to contact the registration department to sign all necessary forms in order for us  to release information regarding their care.   Consent: Patient/Guardian gives verbal consent for treatment and assignment of benefits for services provided during this visit. Patient/Guardian expressed understanding and agreed to proceed.   Theresa Cantrell [place], [facility] 03/06/2024
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
## 2024-03-15 ENCOUNTER — Ambulatory Visit: Payer: MEDICAID | Admitting: Dietician

## 2024-03-15 DIAGNOSIS — E66812 Obesity, class 2: Secondary | ICD-10-CM

## 2024-03-17 ENCOUNTER — Other Ambulatory Visit (HOSPITAL_COMMUNITY): Payer: Self-pay | Admitting: Physician Assistant

## 2024-03-17 DIAGNOSIS — F3181 Bipolar II disorder: Secondary | ICD-10-CM

## 2024-03-20 ENCOUNTER — Ambulatory Visit: Payer: MEDICAID | Admitting: Internal Medicine

## 2024-03-21 ENCOUNTER — Telehealth (HOSPITAL_COMMUNITY): Payer: Self-pay | Admitting: *Deleted

## 2024-03-21 NOTE — Telephone Encounter (Signed)
 Patient asking for refill of Abilify . States that she also needs an appointment. Transferred to front desk to make appointment.

## 2024-03-22 ENCOUNTER — Other Ambulatory Visit (HOSPITAL_COMMUNITY): Payer: Self-pay

## 2024-03-22 ENCOUNTER — Telehealth (HOSPITAL_COMMUNITY): Payer: Self-pay | Admitting: Physician Assistant

## 2024-03-22 ENCOUNTER — Encounter: Payer: Self-pay | Admitting: Internal Medicine

## 2024-03-22 MED ORDER — HYDROCODONE-ACETAMINOPHEN 5-325 MG PO TABS
1.0000 | ORAL_TABLET | Freq: Three times a day (TID) | ORAL | 0 refills | Status: DC
  Filled 2024-03-22 – 2024-03-24 (×2): qty 21, 7d supply, fill #0

## 2024-03-22 MED ORDER — MELOXICAM 7.5 MG PO TABS
7.5000 mg | ORAL_TABLET | Freq: Two times a day (BID) | ORAL | 1 refills | Status: DC
  Filled 2024-03-22: qty 60, 30d supply, fill #0
  Filled 2024-05-02: qty 60, 30d supply, fill #1

## 2024-03-22 MED ORDER — CYCLOBENZAPRINE HCL 10 MG PO TABS
10.0000 mg | ORAL_TABLET | Freq: Two times a day (BID) | ORAL | 1 refills | Status: DC | PRN
  Filled 2024-03-22 – 2024-03-24 (×2): qty 60, 30d supply, fill #0
  Filled 2024-05-09: qty 60, 30d supply, fill #1

## 2024-03-23 ENCOUNTER — Other Ambulatory Visit (HOSPITAL_COMMUNITY): Payer: Self-pay

## 2024-03-23 NOTE — Telephone Encounter (Signed)
 Message acknowledged and reviewed.

## 2024-03-24 ENCOUNTER — Other Ambulatory Visit (HOSPITAL_COMMUNITY): Payer: Self-pay

## 2024-03-24 ENCOUNTER — Other Ambulatory Visit (INDEPENDENT_AMBULATORY_CARE_PROVIDER_SITE_OTHER): Payer: MEDICAID

## 2024-03-24 ENCOUNTER — Ambulatory Visit: Payer: MEDICAID | Admitting: Orthopedic Surgery

## 2024-03-24 VITALS — BP 119/84 | HR 101 | Ht 64.0 in | Wt 213.0 lb

## 2024-03-24 DIAGNOSIS — M5412 Radiculopathy, cervical region: Secondary | ICD-10-CM

## 2024-03-24 DIAGNOSIS — M542 Cervicalgia: Secondary | ICD-10-CM | POA: Diagnosis not present
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
## 2024-03-28 ENCOUNTER — Encounter (HOSPITAL_COMMUNITY): Payer: Self-pay | Admitting: Physician Assistant

## 2024-03-28 ENCOUNTER — Telehealth (HOSPITAL_COMMUNITY): Payer: MEDICAID | Admitting: Physician Assistant

## 2024-03-28 DIAGNOSIS — F3181 Bipolar II disorder: Secondary | ICD-10-CM | POA: Diagnosis not present

## 2024-03-28 DIAGNOSIS — F4310 Post-traumatic stress disorder, unspecified: Secondary | ICD-10-CM | POA: Diagnosis not present

## 2024-03-28 DIAGNOSIS — F419 Anxiety disorder, unspecified: Secondary | ICD-10-CM

## 2024-03-28 MED ORDER — FLUOXETINE HCL 20 MG PO CAPS: 20.0000 mg | ORAL_CAPSULE | Freq: Every day | ORAL | 2 refills | Status: DC

## 2024-03-28 MED ORDER — TRAZODONE HCL 150 MG PO TABS
150.0000 mg | ORAL_TABLET | Freq: Every evening | ORAL | 1 refills | Status: DC | PRN
Start: 2024-03-28 — End: 2024-05-30

## 2024-03-28 MED ORDER — HYDROXYZINE HCL 10 MG PO TABS: 10.0000 mg | ORAL_TABLET | Freq: Three times a day (TID) | ORAL | 1 refills | Status: DC | PRN

## 2024-03-28 MED ORDER — ARIPIPRAZOLE 15 MG PO TABS: 15.0000 mg | ORAL_TABLET | Freq: Every day | ORAL | 2 refills | Status: DC

## 2024-03-28 MED ORDER — PRAZOSIN HCL 2 MG PO CAPS: 2.0000 mg | ORAL_CAPSULE | Freq: Every day | ORAL | 2 refills | Status: DC

## 2024-03-28 NOTE — Progress Notes (Signed)
 [facility] MD/PA/NP OP Progress Note  Virtual Visit via Video Note  I connected with Theresa Cantrell on 03/28/24 at  3:30 PM EDT by a video enabled telemedicine application and verified that I am speaking with the correct person using two identifiers.  Location: Patient: Home Provider: Clinic   I discussed the limitations of evaluation and management by telemedicine and the availability of in person appointments. The patient expressed understanding and agreed to proceed.  Follow Up Instructions:  I discussed the assessment and treatment plan with the patient. The patient was provided an opportunity to ask questions and all were answered. The patient agreed with the plan and demonstrated an understanding of the instructions.   The patient was advised to call back or seek an in-person evaluation if the symptoms worsen or if the condition fails to improve as anticipated.  I provided 20 minutes of non-face-to-face time during this encounter.  Gates Kasal, PA    03/28/2024 6:02 PM VERLA BRYNGELSON  MRN:  [id number]  Chief Complaint:  Chief Complaint  Patient presents with   Follow-up   Medication Refill   HPI:   Theresa Cantrell is a 48 year old female with a past psychiatric history significant for bipolar 2 disorder (major depressive episode), PTSD, and anxiety who presents to [facility] via virtual video visit for follow-up and medication management.  Patient is currently being managed on the medications:  Trazodone  150 mg at bedtime Abilify  15 mg daily Fluoxetine  20 mg daily Hydroxyzine  10 mg 3 times daily as needed Prazosin  2 mg at bedtime  Patient presents to the encounter reporting no issues or concerns regarding her current medication regimen.  She does report experiencing dry mouth associated with the use of her medication.  Patient's dry mouth is most likely associated with the use of her Prozac .  Despite experiencing dry mouth,  patient would like to continue taking fluoxetine .  Patient endorses mild depression.  She rates her depression a 5 out of 10 with 10 being most severe.  Patient endorses depressive episodes 2 to 3 days out of the week.  Patient reports that her depression is attributed to the pain she is constantly in.  Patient endorses the following depressive symptoms: feelings of sadness, lack of motivation, feelings of guilt/worthlessness, and hopelessness.  She denies decreased concentration, irritability, or decreased energy.  Patient endorses very little anxiety and states that she may experience episodes of anxiety twice per week.  A PHQ-9 screen was performed with the patient scoring a 9.  A GAD-7 screen was also performed with the patient scoring and 18.  Patient is alert and oriented x 4, calm, cooperative, and fully engaged in conversation during the encounter.  Patient endorses happy mood.  Patient exhibits euthymic mood with appropriate affect.  Patient denies suicidal or homicidal ideations.  She further denies auditory or visual hallucinations and does not appear to be responding to internal/external stimuli.  Patient endorses fair sleep and receives on average 4 to 6 hours of sleep per night.  Patient endorses fair appetite and eats on average 1-2 meals per day.  Patient denies alcohol consumption, tobacco use, or illicit drug use.  Visit Diagnosis:    ICD-10-CM   1. Anxiety  F41.9 hydrOXYzine  (ATARAX ) 10 MG tablet    FLUoxetine  (PROZAC ) 20 MG capsule    2. Bipolar 2 disorder, major depressive episode (HCC)  F31.81 ARIPiprazole  (ABILIFY ) 15 MG tablet    traZODone  (DESYREL ) 150 MG tablet  FLUoxetine  (PROZAC ) 20 MG capsule    3. PTSD (post-traumatic stress disorder)  F43.10 prazosin  (MINIPRESS ) 2 MG capsule    FLUoxetine  (PROZAC ) 20 MG capsule     Past Psychiatric History:  Disorder (major depressive episode) Anxiety PTSD  Past Medical History:  Past Medical History:  Diagnosis Date    Anxiety    Arthritis    Bipolar 1 disorder (HCC)    Depression    GERD (gastroesophageal reflux disease)    Hypertension    PTSD (post-traumatic stress disorder)     Past Surgical History:  Procedure Laterality Date   ABDOMINAL HYSTERECTOMY     ANTERIOR CERVICAL DECOMP/DISCECTOMY FUSION N/A 06/06/2021   Procedure: C4-5, C5-6 ANTERIOR CERVICAL DISCECTOMY FUSION, ALLOGRAFT, PLATE;  Surgeon: Adah Acron, MD;  Location: [facility] OR;  Service: Orthopedics;  Laterality: N/A;   CHOLECYSTECTOMY     DILATION AND CURETTAGE OF UTERUS     FOOT SURGERY Bilateral    two pins in each foot   JOINT REPLACEMENT Left 2018   partial knee replacement   KNEE SURGERY Left 2018   partial knee replacement   NECK SURGERY  06/06/2021   C4-5, C5-6 ANTERIOR CERVICAL DISCECTOMY FUSION, ALLOGRAFT, PLATE    Family Psychiatric History:  Father - bipolar disorder, schizophrenia. Patient reports that her father was on medications but does not remember what medications he was on Mother - depression, anxiety.  Patient reports that her mother was on medications but does not remember what medications she was on   Family history of suicide: Patient reports that her father attempted multiple times in the past Family history of homicide: Patient denies Family history of substance abuse: Patient reports that her father abused crack cocaine, marijuana, and alcohol  Family History:  Family History  Problem Relation Age of Onset   Hypertension Mother    Cancer Mother        breast   Anxiety disorder Mother    Hypertension Father    Schizophrenia Father    Bipolar disorder Father    Anxiety disorder Father    Parkinson's disease Father    Colon cancer Neg Hx    Colon polyps Neg Hx    Esophageal cancer Neg Hx    Rectal cancer Neg Hx    Stomach cancer Neg Hx     Social History:  Social History   Socioeconomic History   Marital status: Single    Spouse name: Not on file   Number of children: 1   Years of  education: Not on file   Highest education level: Some college, no degree  Occupational History   Not on file  Tobacco Use   Smoking status: Former    Current packs/day: 0.25    Types: Cigarettes   Smokeless tobacco: Never  Vaping Use   Vaping status: Never Used  Substance and Sexual Activity   Alcohol use: Yes    Alcohol/week: 2.0 standard drinks of alcohol    Types: 1 Glasses of wine, 1 Cans of beer per week   Drug use: No   Sexual activity: Not Currently    Birth control/protection: Surgical  Other Topics Concern   Not on file  Social History Narrative   Not on file   Social Drivers of Health   Financial Resource Strain: High Risk (11/26/2023)   Overall Financial Resource Strain (CARDIA)    Difficulty of Paying Living Expenses: Very hard  Food Insecurity: Food Insecurity Present (02/11/2024)   Hunger Vital Sign    Worried  About Running Out of Food in the Last Year: Sometimes true    Ran Out of Food in the Last Year: Sometimes true  Transportation Needs: No Transportation Needs (11/26/2023)   PRAPARE - Administrator, Civil Service (Medical): No    Lack of Transportation (Non-Medical): No  Physical Activity: Inactive (11/26/2023)   Exercise Vital Sign    Days of Exercise per Week: 0 days    Minutes of Exercise per Session: 0 min  Stress: Stress Concern Present (11/26/2023)   [employer] of Occupational Health - Occupational Stress Questionnaire    Feeling of Stress : Rather much  Social Connections: Moderately Isolated (11/26/2023)   Social Connection and Isolation Panel [NHANES]    Frequency of Communication with Friends and Family: More than three times a week    Frequency of Social Gatherings with Friends and Family: More than three times a week    Attends Religious Services: Never    Database administrator or Organizations: Yes    Attends Banker Meetings: 1 to 4 times per year    Marital Status: Never married    Allergies:   Allergies  Allergen Reactions   Amlodipine  Swelling    Lower extremity edema.   Hydrochlorothiazide  Other (See Comments)    Cough   Lamotrigine Hives   Chlorthalidone  Rash and Other (See Comments)    Cramping    Metabolic Disorder Labs: Lab Results  Component Value Date   HGBA1C 5.9 (H) 09/17/2023   No results found for: "PROLACTIN" Lab Results  Component Value Date   CHOL 187 09/17/2023   TRIG 105 09/17/2023   HDL 71 09/17/2023   CHOLHDL 2.6 09/17/2023   LDLCALC 97 09/17/2023   LDLCALC 86 11/07/2020   Lab Results  Component Value Date   TSH 0.711 11/23/2022   TSH 0.484 03/31/2018    Therapeutic Level Labs: No results found for: "LITHIUM" No results found for: "VALPROATE" No results found for: "CBMZ"  Current Medications: Current Outpatient Medications  Medication Sig Dispense Refill   albuterol  (VENTOLIN  HFA) 108 (90 Base) MCG/ACT inhaler Inhale 2 puffs into the lungs every 6 (six) hours as needed for wheezing or shortness of breath. (Patient not taking: Reported on 02/24/2024) 8 g 0   ARIPiprazole  (ABILIFY ) 15 MG tablet Take 1 tablet (15 mg total) by mouth daily. 30 tablet 2   Cholecalciferol  (VITAMIN D ) 50 MCG (2000 UT) tablet Take 1 tablet (2,000 Units total) by mouth daily. 90 tablet 1   cyclobenzaprine  (FLEXERIL ) 10 MG tablet Take 0.5-1 tablets (5-10 mg total) by mouth 2 (two) times daily as needed for muscle spasms. 20 tablet 0   cyclobenzaprine  (FLEXERIL ) 10 MG tablet Take 1 tablet (10 mg total) by mouth every 12 (twelve) hours as needed for spasms 60 tablet 1   diclofenac  (VOLTAREN ) 75 MG EC tablet Take 1 tablet (75 mg total) by mouth 2 (two) times daily. 30 tablet 2   FLUoxetine  (PROZAC ) 20 MG capsule Take 1 capsule (20 mg total) by mouth daily. 30 capsule 2   furosemide  (LASIX ) 20 MG tablet TAKE 1 TABLET BY MOUTH ON MONDAY, WEDNESDAY AND FRIDAY 36 tablet 1   gabapentin (NEURONTIN) 300 MG capsule Take 300 mg by mouth 3 (three) times daily.      HYDROcodone -acetaminophen  (NORCO/VICODIN) 5-325 MG tablet Take 1 tablet by mouth every 8 (eight) hours as needed.     HYDROcodone -acetaminophen  (NORCO/VICODIN) 5-325 MG tablet Take 1 tablet by mouth every 8 (eight) hours as needed  for 7 days 21 tablet 0   hydrOXYzine  (ATARAX ) 10 MG tablet Take 1 tablet (10 mg total) by mouth 3 (three) times daily as needed. 75 tablet 1   meloxicam  (MOBIC ) 7.5 MG tablet Take 1 tablet (7.5 mg total) by mouth 2 (two) times daily as needed. 60 tablet 1   NIFEdipine  (PROCARDIA -XL/NIFEDICAL-XL) 30 MG 24 hr tablet Take 1 tablet (30 mg total) by mouth daily. 90 tablet 1   omeprazole  (PRILOSEC) 20 MG capsule Take 1 capsule (20 mg total) by mouth 2 (two) times daily before a meal. 60 capsule 3   prazosin  (MINIPRESS ) 2 MG capsule Take 1 capsule (2 mg total) by mouth at bedtime. 30 capsule 2   Semaglutide -Weight Management (WEGOVY ) 1.7 MG/0.75ML SOAJ Inject 1.7 mg into the skin once a week. 3 mL 0   Semaglutide -Weight Management (WEGOVY ) 2.4 MG/0.75ML SOAJ Inject 2.4 mg into the skin once a week. 9 mL 1   traZODone  (DESYREL ) 150 MG tablet Take 1 tablet (150 mg total) by mouth at bedtime as needed for sleep. 30 tablet 1   valACYclovir  (VALTREX ) 1000 MG tablet Take 1 tablet (1,000 mg total) by mouth daily. 90 tablet 1   valsartan  (DIOVAN ) 80 MG tablet Take 1 tablet (80 mg total) by mouth daily. 90 tablet 1   No current facility-administered medications for this visit.     Musculoskeletal: Strength & Muscle Tone: within normal limits Gait & Station: ataxic, patient ambulates with a cane Patient leans: N/A  Psychiatric Specialty Exam: Review of Systems  Psychiatric/Behavioral:  Positive for dysphoric mood and sleep disturbance. Negative for decreased concentration, hallucinations, self-injury and suicidal ideas. The patient is nervous/anxious. The patient is not hyperactive.     There were no vitals taken for this visit.There is no height or weight on file to calculate  BMI.  General Appearance: Casual  Eye Contact:  Good  Speech:  Clear and Coherent and Normal Rate  Volume:  Normal  Mood:  Anxious and Depressed  Affect:  Appropriate  Thought Process:  Coherent, Goal Directed, and Descriptions of Associations: Intact  Orientation:  Full (Time, Place, and Person)  Thought Content: WDL   Suicidal Thoughts:  No  Homicidal Thoughts:  No  Memory:  Immediate;   Good Recent;   Good Remote;   Good  Judgement:  Good  Insight:  Good  Psychomotor Activity:  Normal  Concentration:  Concentration: Good and Attention Span: Good  Recall:  Good  Fund of Knowledge: Good  Language: Good  Akathisia:  No  Handed:  Right  AIMS (if indicated): not done  Assets:  Communication Skills Desire for Improvement Financial Resources/Insurance Housing Social Support Transportation Vocational/Educational  ADL's:  Intact  Cognition: WNL  Sleep:  Fair   Screenings: AIMS    Flowsheet Row Video Visit from 03/28/2024 in [facility]  AIMS Total Score 0      AUDIT    Flowsheet Row Office Visit from 05/17/2023 in [facility] - A Dept Of [place]. [facility] Most recent reading at 04/02/2023  8:48 AM Appointment from 04/06/2023 in [facility] - A Dept Of [place]. [facility] Most recent reading at 04/02/2023  8:48 AM  Alcohol Use Disorder Identification Test Final Score (AUDIT) 3 3      GAD-7    Flowsheet Row Video Visit from 03/28/2024 in [facility] Office Visit from 02/24/2024 in [facility] [place] -  A Dept Of [place]. [facility] Office Visit from 01/17/2024 in [facility] - A Dept Of [place]. [facility] Video Visit from 12/30/2023 in [facility] Office Visit from 12/07/2023 in [facility] [place] - A Dept Of [place]. [facility]  Total GAD-7 Score 18  14 21 21 21       PHQ2-9    Flowsheet Row Video Visit from 03/28/2024 in [facility] Office Visit from 02/24/2024 in [facility] - A Dept Of [place]. [facility] Nutrition from 02/11/2024 in [facility]  - A Dept Of [place]. [facility] Office Visit from 01/17/2024 in [facility] - A Dept Of [place] Frag[place]. [facility] Video Visit from 12/30/2023 in [facility]  PHQ-2 Total Score 4 5 0 6 6  PHQ-9 Total Score 9 11 -- 18 21      Flowsheet Row Video Visit from 03/28/2024 in [facility] Video Visit from 12/30/2023 in [facility] ED from 10/09/2023 in [facility] Emergency Department at [facility]  C-SSRS RISK CATEGORY Moderate Risk No Risk No Risk        Assessment and Plan:   Theresa Cantrell is a 48 year old female with a past psychiatric history significant for bipolar 2 disorder (major depressive episode), PTSD, and anxiety who presents to [facility] via virtual video visit for follow-up and medication management.  Patient presents to the encounter stating that she continues to take her medications regularly.  She reports that she experiences dry mouth when taking her medications.  Provider informed patient that her dry mouth is most likely attributed to her use of fluoxetine .  Despite experiencing dry mouth, patient would like to continue taking her fluoxetine  as prescribed.  Patient denies experiencing involuntary movements from the use of her Abilify .  An aims assessment was performed with the patient scoring a 0.  Patient endorses minimal depression stating that she experiences episodes of depression 2 to 3 days out of the week.  She also endorses very little anxiety.  A PHQ-9 screen was performed with the patient scoring a 9.  A GAD-7 screen was also  performed with the patient scoring and 18.  Despite her ongoing depression and anxiety, patient would like to continue taking her medications as prescribed.  Patient's medications to be e-prescribed to pharmacy of choice.  A [place] Suicide Severity Rating Scale was performed with the patient being considered moderate risk.  Patient denies suicidal ideations and is able to contract for safety following the conclusion of the encounter.  Collaboration of Care: Collaboration of Care: Medication Management AEB provider managing patient's psychiatric medications, Primary Care Provider AEB patient being seen by internal medicine, Psychiatrist AEB patient being seen by a mental health provider at this facility, and Referral or follow-up with counselor/therapist AEB patient being seen by a licensed clinical social worker at this facility  Patient/Guardian was advised Release of Information must be obtained prior to any record release in order to collaborate their care with an outside provider. Patient/Guardian was advised if they have not already done so to contact the registration department to sign all necessary forms in order for us  to release information regarding their care.   Consent: Patient/Guardian gives verbal consent for treatment and assignment of benefits for services provided during  this visit. Patient/Guardian expressed understanding and agreed to proceed.   1. Anxiety  - hydrOXYzine  (ATARAX ) 10 MG tablet; Take 1 tablet (10 mg total) by mouth 3 (three) times daily as needed.  Dispense: 75 tablet; Refill: 1 - FLUoxetine  (PROZAC ) 20 MG capsule; Take 1 capsule (20 mg total) by mouth daily.  Dispense: 30 capsule; Refill: 2  2. Bipolar 2 disorder, major depressive episode (HCC)  - ARIPiprazole  (ABILIFY ) 15 MG tablet; Take 1 tablet (15 mg total) by mouth daily.  Dispense: 30 tablet; Refill: 2 - traZODone  (DESYREL ) 150 MG tablet; Take 1 tablet (150 mg total) by mouth at bedtime as needed for  sleep.  Dispense: 30 tablet; Refill: 1 - FLUoxetine  (PROZAC ) 20 MG capsule; Take 1 capsule (20 mg total) by mouth daily.  Dispense: 30 capsule; Refill: 2  3. PTSD (post-traumatic stress disorder)  - prazosin  (MINIPRESS ) 2 MG capsule; Take 1 capsule (2 mg total) by mouth at bedtime.  Dispense: 30 capsule; Refill: 2 - FLUoxetine  (PROZAC ) 20 MG capsule; Take 1 capsule (20 mg total) by mouth daily.  Dispense: 30 capsule; Refill: 2  Patient to follow up in 2 months Provider spent a total of 20 minutes with the patient/reviewing patient's chart  Gates Kasal, PA 03/28/2024, 6:02 PM

## 2024-03-29 ENCOUNTER — Other Ambulatory Visit: Payer: Self-pay

## 2024-03-30 ENCOUNTER — Other Ambulatory Visit: Payer: Self-pay
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
## 2024-03-31 ENCOUNTER — Telehealth: Payer: Self-pay | Admitting: Internal Medicine

## 2024-03-31 NOTE — Telephone Encounter (Signed)
 Copied from CRM [id number]. Topic: Clinical - Medication Prior Auth >> Mar 31, 2024  1:54 PM Yolanda T wrote: Reason for CRM: patient called stated insurance is needing the PA. Patient stated she takes her shot on Mondays and need it sent asap

## 2024-04-03 ENCOUNTER — Other Ambulatory Visit: Payer: Self-pay

## 2024-04-03 ENCOUNTER — Encounter: Payer: Self-pay | Admitting: Orthopedic Surgery

## 2024-04-07 ENCOUNTER — Encounter: Payer: MEDICAID | Attending: Internal Medicine | Admitting: Dietician

## 2024-04-07 VITALS — Wt 211.1 lb

## 2024-04-07 DIAGNOSIS — E66812 Obesity, class 2: Secondary | ICD-10-CM | POA: Diagnosis present

## 2024-04-07 DIAGNOSIS — Z6835 Body mass index (BMI) 35.0-35.9, adult: Secondary | ICD-10-CM | POA: Insufficient documentation

## 2024-04-10 ENCOUNTER — Other Ambulatory Visit: Payer: Self-pay | Admitting: Orthopedic Surgery

## 2024-04-11 ENCOUNTER — Ambulatory Visit: Payer: MEDICAID | Attending: Orthopedic Surgery | Admitting: Physical Therapy

## 2024-04-11 ENCOUNTER — Encounter: Payer: Self-pay | Admitting: Physical Therapy

## 2024-04-11 ENCOUNTER — Other Ambulatory Visit: Payer: Self-pay

## 2024-04-11 DIAGNOSIS — M25512 Pain in left shoulder: Secondary | ICD-10-CM | POA: Insufficient documentation

## 2024-04-11 DIAGNOSIS — M6281 Muscle weakness (generalized): Secondary | ICD-10-CM | POA: Insufficient documentation

## 2024-04-11 DIAGNOSIS — M5412 Radiculopathy, cervical region: Secondary | ICD-10-CM | POA: Insufficient documentation

## 2024-04-11 DIAGNOSIS — M542 Cervicalgia: Secondary | ICD-10-CM | POA: Insufficient documentation

## 2024-04-11 DIAGNOSIS — G8929 Other chronic pain: Secondary | ICD-10-CM | POA: Insufficient documentation

## 2024-04-11 NOTE — Therapy (Signed)
 OUTPATIENT PHYSICAL THERAPY SHOULDER EVALUATION   Patient Name: Theresa Cantrell MRN: [id number] DOB:09-30-1976, 48 y.o., female Today's Date: 04/12/2024   PT End of Session - 04/11/24 1626     Visit Number 1    Number of Visits --   1-2x/week   Date for PT Re-Evaluation 06/06/24    Authorization Type [employer]    PT Start Time 1545    PT Stop Time 1615    PT Time Calculation (min) 30 min             Past Medical History:  Diagnosis Date   Anxiety    Arthritis    Bipolar 1 disorder (HCC)    Depression    GERD (gastroesophageal reflux disease)    Hypertension    PTSD (post-traumatic stress disorder)    Past Surgical History:  Procedure Laterality Date   ABDOMINAL HYSTERECTOMY     ANTERIOR CERVICAL DECOMP/DISCECTOMY FUSION N/A 06/06/2021   Procedure: C4-5, C5-6 ANTERIOR CERVICAL DISCECTOMY FUSION, ALLOGRAFT, PLATE;  Surgeon: Adah Acron, MD;  Location: [facility] OR;  Service: Orthopedics;  Laterality: N/A;   CHOLECYSTECTOMY     DILATION AND CURETTAGE OF UTERUS     FOOT SURGERY Bilateral    two pins in each foot   JOINT REPLACEMENT Left 2018   partial knee replacement   KNEE SURGERY Left 2018   partial knee replacement   NECK SURGERY  06/06/2021   C4-5, C5-6 ANTERIOR CERVICAL DISCECTOMY FUSION, ALLOGRAFT, PLATE   Patient Active Problem List   Diagnosis Date Noted   Gastroesophageal reflux disease with esophagitis without hemorrhage 01/17/2024   Cervical strain, acute, sequela 12/21/2023   Protrusion of lumbar intervertebral disc 03/24/2023   Depression with anxiety 11/30/2022   Partial thickness rotator cuff tear 11/16/2022   Adjustment disorder with mixed anxiety and depressed mood 02/11/2022   Rotator cuff tendinitis, right 11/14/2021   S/P cervical spinal fusion 06/17/2021   COVID-19 vaccine series not completed 12/20/2020   Influenza vaccine refused 11/07/2020   Essential hypertension 11/07/2020   Obesity (BMI 30.0-34.9) 11/07/2020   Tobacco dependence  11/07/2020   Gastroesophageal reflux disease without esophagitis 11/07/2020   History of abnormal cervical Pap smear 11/07/2020   History of bipolar disorder 11/07/2020   Bipolar 2 disorder, major depressive episode (HCC) 06/12/2020   PTSD (post-traumatic stress disorder) 06/12/2020   Chronic pain of left knee 10/05/2019   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   Neuropathy 02/07/2014   Hot flashes 02/07/2014   Weight loss 02/07/2014   Anxiety     PCP: Lawrance Presume, MD  REFERRING PROVIDER: Diedra Fowler, MD  THERAPY DIAG:  Cervicalgia  Muscle weakness (generalized)  REFERRING DIAG: Radiculopathy, cervical region [M54.12]   Rationale for Evaluation and Treatment:  Rehabilitation  SUBJECTIVE:  PERTINENT PAST HISTORY:  Neuropathy, bipolar, PTSD, cervical spinal fusion C4-C6, depression and anxiety, ptsd, L knee pain with multiple surgeries      PRECAUTIONS: None  WEIGHT BEARING RESTRICTIONS No  FALLS:  Has patient fallen in last 6 months? No, Number of falls: 0  MOI/History of condition:  Onset date: December of 2025  SUBJECTIVE STATEMENT  Theresa Cantrell is a 48 y.o. female who presents to clinic with chief complaint of neck pain with bil radicular sxs L>R.  She feels weak at times in the L arm.  Denies any clumsiness or frequently dropping objects.  She has intermittent n/t in bil hands L>R.  Denies any significant change in gait or balance.  Red flags:  denies   Pain:  Are you having pain? Yes Pain location: neck L>R arm NPRS scale:  Average: 0/10, Worst: 8/10 Aggravating factors: neck movements (L rotation and s/b) Relieving factors: rest Pain description: intermittent  Occupation: NA  Assistive Device: SPC  Hand Dominance: R  Patient Goals/Specific Activities: reduce pain   OBJECTIVE:   DIAGNOSTIC FINDINGS:  XRs of the cervical spine from 03/24/2024 were independently reviewed and  interpreted, showing anterior  cervical instrumentation from C4-C6.  There  appears to be fusion mass spanning the former disc spaces.  No lucency  seen around the screws.  None screws backed out.  Small anterior  osteophyte formation off the inferior aspect of the C3 vertebra.  Disc  height loss at C6/7.  No evidence of instability on flexion/extension  views.  No fracture or dislocation seen.   GENERAL OBSERVATION: Antalgic gait using SPC     SENSATION: Light touch: Appears intact on exam subjective reports of n/t in L>R hand   PALPATION: TTP bil sub occipitals, exquisite TTP L UT  Cervical ROM  ROM ROM  (Eval)  Flexion 30  Extension 30  Right lateral flexion 30  Left lateral flexion 25*  Right rotation 45  Left rotation 40*  Flexion rotation (normal is 30 degrees)   Flexion rotation (normal is 30 degrees)     (Blank rows = not tested, N = WNL, * = concordant pain)  UPPER EXTREMITY MMT:  MMT Right (Eval) Left (Eval)  Shoulder flexion 4 4  Shoulder abduction (C5) c c  Shoulder ER    Shoulder IR    Middle trapezius    Lower trapezius    Shoulder extension    Grip strength 36 lbs 15 lbs  Shoulder shrug (C4) c c  Elbow flexion (C6) c c  Elbow ext (C7) c c  Thumb ext (C8) c c  Finger abd (T1) c c  DNF endurance 9''    (Blank rows = not tested, score listed is out of 5 possible points.  N = WNL, D = diminished, C = clear for gross weakness with myotome testing, * = concordant pain with testing)   SPECIAL TESTS:  Spurling's: + L  Hoffman's:  (-)   JOINT MOBILITY TESTING:  Hypomobile throughout Cx spine  PATIENT SURVEYS:  NDI: 30/50    TODAY'S TREATMENT:  Therapeutic Exercise: Creating, reviewing, and completing below HEP  Manual: STM L UT and sub occipitals    PATIENT EDUCATION ([place]/[facility]):  POC, diagnosis, prognosis, HEP, and outcome measures.  Pt educated via explanation, demonstration, and handout (HEP).  Pt confirms understanding verbally.    HOME EXERCISE PROGRAM: Access Code:  [email or web address] URL: [email or web address] Date: 04/11/2024 Prepared by: Lesleigh Rash  Exercises - Supine Cervical Retraction with Towel  - 2 x daily - 7 x weekly - 2 sets - 10 reps - 5 second hold - Standing Shoulder Horizontal Abduction with Resistance  - 1 x daily - 7 x weekly - 3 sets - 10 reps - Seated Shoulder Rolls  - 1 x daily - 7 x weekly - 3 sets - 10 reps  Treatment priorities   Eval        L UT manual/TDN        posture        DNF endurance                          ASSESSMENT:  CLINICAL IMPRESSION: Theresa Cantrell is a  48 y.o. female who presents to clinic with signs and sxs consistent with neck pain with intermittent radicular sxs.  No signs of gross neurological weakness on exam but pt does have significantly reduced grip strength L vs R.  Consistent with central vs bil foraminal stenosis.  Sxs correspond to x-ray showing height loss at C6-C7.  Theresa Cantrell will benefit form skilled therapy to address relevant deficits and improve comfort with daily tasks such as driving and lifting.  OBJECTIVE IMPAIRMENTS: Pain, cervical ROM, UE strength  ACTIVITY LIMITATIONS: lifting, head turns while driving, reading  PERSONAL FACTORS: See medical history and pertinent history   REHAB POTENTIAL: Good  CLINICAL DECISION MAKING: Evolving/moderate complexity  EVALUATION COMPLEXITY: Moderate   GOALS:   SHORT TERM GOALS: Target date: 05/10/2024   Theresa Cantrell will be >75% HEP compliant to improve carryover between sessions and facilitate independent management of condition  Evaluation: ongoing Goal status: INITIAL   LONG TERM GOALS: Target date: 06/07/2024   Theresa Cantrell will self report >/= 50% decrease in pain from evaluation to improve function in daily tasks  Evaluation/Baseline: 8/10 max pain Goal status: INITIAL   2.  Theresa Cantrell will show a >/= 10 pt improvement in their NDI score (MCID ~20% or 10/50 pts) as a proxy for functional improvement  Evaluation/Baseline: 30/50  pts Goal status: INITIAL   3.  Theresa Cantrell will show significant improvement in L grip strength  Evaluation/Baseline: 15 lbs Goal status: INITIAL   4.  Theresa Cantrell will report <1 instance of N/T in UE at night  Evaluation/Baseline: limited Goal status: INITIAL   PLAN: PT FREQUENCY: 1-2x/week  PT DURATION: 8 weeks  PLANNED INTERVENTIONS:  [id number]- PT Re-evaluation, 97110-Therapeutic exercises, 97530- Therapeutic activity, [id number]- Neuromuscular re-education, 97535- Self Care, 91478- Manual therapy, [id number]- Gait training, [id number]- Aquatic Therapy, [phone]- Electrical stimulation (manual), [id number]- Vasopneumatic device, [id number]- Traction (mechanical), [id number]- Ionotophoresis 4mg /ml Dexamethasone , Taping, Dry Needling, Joint manipulation, and Spinal manipulation.   Lesleigh Rash PT, DPT 04/12/2024, 8:11 AM  I just finished a MCD eval/recert.  Name: Theresa Cantrell  MRN: [id number] Please request 1x/week for 8 weeks.  Check all conditions that are expected to impact treatment: Musculoskeletal disorders   I DID put a charge in.  Check all possible CPT codes: 69629- Therapeutic Exercise, [phone]- Neuro Re-education, [phone] - Gait Training, [phone] - Manual Therapy, 97530 - Therapeutic Activities, 97535 - Self Care, [phone] - Re-evaluation, [id number] - Mechanical traction, and [phone] - Aquatic therapy   Thank you!  MCD - Secure

## 2024-04-12 ENCOUNTER — Other Ambulatory Visit: Payer: Self-pay

## 2024-04-12 ENCOUNTER — Other Ambulatory Visit: Payer: MEDICAID

## 2024-04-13 ENCOUNTER — Other Ambulatory Visit (HOSPITAL_COMMUNITY): Payer: Self-pay

## 2024-04-13 MED ORDER — HYDROCODONE-ACETAMINOPHEN 5-325 MG PO TABS
1.0000 | ORAL_TABLET | Freq: Every day | ORAL | 0 refills | Status: DC | PRN
  Filled 2024-04-13: qty 30, 30d supply, fill #0

## 2024-04-20 ENCOUNTER — Ambulatory Visit: Payer: MEDICAID | Admitting: Orthopedic Surgery

## 2024-04-24 ENCOUNTER — Encounter: Payer: Self-pay | Admitting: Internal Medicine

## 2024-04-24 ENCOUNTER — Ambulatory Visit: Payer: MEDICAID | Attending: Internal Medicine | Admitting: Internal Medicine

## 2024-04-24 ENCOUNTER — Other Ambulatory Visit: Payer: Self-pay

## 2024-04-24 VITALS — BP 118/84 | HR 91 | Temp 98.3°F | Resp 16 | Ht 65.0 in | Wt 212.0 lb

## 2024-04-24 DIAGNOSIS — M549 Dorsalgia, unspecified: Secondary | ICD-10-CM

## 2024-04-24 DIAGNOSIS — N6489 Other specified disorders of breast: Secondary | ICD-10-CM

## 2024-04-24 DIAGNOSIS — R631 Polydipsia: Secondary | ICD-10-CM

## 2024-04-24 DIAGNOSIS — H04129 Dry eye syndrome of unspecified lacrimal gland: Secondary | ICD-10-CM | POA: Diagnosis not present

## 2024-04-24 DIAGNOSIS — E66812 Obesity, class 2: Secondary | ICD-10-CM

## 2024-04-24 DIAGNOSIS — R682 Dry mouth, unspecified: Secondary | ICD-10-CM

## 2024-04-24 DIAGNOSIS — Z6835 Body mass index (BMI) 35.0-35.9, adult: Secondary | ICD-10-CM | POA: Diagnosis not present

## 2024-04-24 DIAGNOSIS — G8929 Other chronic pain: Secondary | ICD-10-CM

## 2024-04-24 DIAGNOSIS — R7303 Prediabetes: Secondary | ICD-10-CM

## 2024-04-24 MED ORDER — ARTIFICIAL TEARS 1 % OP SOLN
1.0000 [drp] | Freq: Two times a day (BID) | OPHTHALMIC | 0 refills | Status: DC | PRN
  Filled 2024-04-24: qty 30, fill #0

## 2024-04-24 NOTE — Patient Instructions (Signed)
 VISIT SUMMARY:  Today, we discussed your ongoing weight management, the discomfort caused by large breasts, and your concerns about dry mouth and eyes. We also talked about the potential progression of prediabetes and the impact of your current medications.  YOUR PLAN:  -BREAST HYPERTROPHY: Breast hypertrophy means having excessively large breasts, which can cause discomfort such as upper back pain and shoulder indentations. We will refer you to a plastic surgeon to evaluate the possibility of breast reduction surgery.  -OBESITY: Obesity is a condition characterized by excessive body weight. You are currently on Wegovy , which has helped reduce your appetite and slightly decrease your weight. We recommend continuing Wegovy  at 2.4 mg, engaging in water aerobics 2-3 times a week, and continuing your consultations with a nutritionist.  -PREDIABETES: Prediabetes is a condition where blood sugar levels are higher than normal but not yet high enough to be diagnosed as diabetes. We will monitor your condition closely to watch for any progression to diabetes, especially while you are on Wegovy .  -XEROSTOMIA: Xerostomia means having a dry mouth, which can be caused by medications like Abilify  or conditions like Sjogren's syndrome. We will order a blood test to check for Sjogren's syndrome and advise you to verify that your CPAP machine has heated humidification.  -DRY EYES: Dry eyes can cause discomfort and a burning sensation, possibly related to conditions like Sjogren's syndrome. We recommend using artificial tears and scheduling an appointment with an eye doctor.  INSTRUCTIONS:  Please follow up with the plastic surgeon for a breast reduction evaluation. Continue taking Wegovy  at 2.4 mg, participate in water aerobics 2-3 times a week, and keep seeing your nutritionist. We will monitor your prediabetes for any progression. Get a blood test to screen for Sjogren's syndrome and check your CPAP machine for  heated humidification. Use artificial tears for your dry eyes and make an appointment with an eye doctor.

## 2024-04-24 NOTE — Progress Notes (Signed)
 Patient ID: Theresa Cantrell, female    DOB: 02/18/1976  MRN: [id number]  CC: Obesity, dry mouth, and Back Pain (Referral for breast reduction)   Subjective: Theresa Cantrell is a 48 y.o. female who presents for chronic ds management. Her concerns today include:  Patient with history of HTN, former tob dep, GERD, obesity,  PTSD, Bipolar 2,  CTS RT hand, family history of breast cancer in her mother, OA knees, OSA on CPAP (through Dr. Carlie).   Discussed the use of AI scribe software for clinical note transcription with the patient, who gave verbal consent to proceed.  History of Present Illness Theresa Cantrell is a 48 year old female who presents for follow-up regarding weight management and discussion of breast reduction surgery.  She is on Wegovy , with a dose increase from 1.7 mg to 2.4 mg since April, resulting in a slight weight decrease from 213 to 212 pounds. She has a decreased appetite, avoids sugary snacks, eats fruits, and consults a nutritionist. Back issues limit her physical activity.  Her pain management provider has referred her to a neurologist.  She experiences significant discomfort from large breasts, wearing a 42DD bra, with upper back pain, shoulder strap imprints, and lower back pressure when sitting.  The upper back pain has been going on for years and she feels it is due to the large breast.  She would like to be considered for breast reduction.  Severe dry mouth persists, managed with sugar-free candy and water. She uses CPAP with heated humidification.  Endorses dry eyes as well with burning, using over-the-counter lubricants and planning an eye doctor visit. She uses Chapstick for dry lips. She uses Abilify , which may contribute to dry mouth.  She has history of prediabetes.    Patient Active Problem List   Diagnosis Date Noted   Gastroesophageal reflux disease with esophagitis without hemorrhage 01/17/2024   Cervical strain, acute, sequela 12/21/2023    Protrusion of lumbar intervertebral disc 03/24/2023   Depression with anxiety 11/30/2022   Partial thickness rotator cuff tear 11/16/2022   Adjustment disorder with mixed anxiety and depressed mood 02/11/2022   Rotator cuff tendinitis, right 11/14/2021   S/P cervical spinal fusion 06/17/2021   COVID-19 vaccine series not completed 12/20/2020   Influenza vaccine refused 11/07/2020   Essential hypertension 11/07/2020   Obesity (BMI 30.0-34.9) 11/07/2020   Tobacco dependence 11/07/2020   Gastroesophageal reflux disease without esophagitis 11/07/2020   History of abnormal cervical Pap smear 11/07/2020   History of bipolar disorder 11/07/2020   Bipolar 2 disorder, major depressive episode (HCC) 06/12/2020   PTSD (post-traumatic stress disorder) 06/12/2020   Chronic pain of left knee 10/05/2019   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 30 tablet 2   Cholecalciferol  (VITAMIN D ) 50 MCG (2000 UT) tablet Take 1 tablet (2,000 Units total) by mouth daily. 90 tablet 1   cyclobenzaprine  (FLEXERIL ) 10 MG tablet Take 1 tablet (10 mg total) by mouth every 12 (twelve) hours as needed for spasms 60 tablet 1   FLUoxetine  (PROZAC ) 20 MG capsule Take 1 capsule (20 mg total) by mouth daily. 30 capsule 2   furosemide  (LASIX ) 20 MG tablet TAKE 1 TABLET BY  MOUTH ON MONDAY, WEDNESDAY AND FRIDAY 36 tablet 1   gabapentin (NEURONTIN) 300 MG capsule Take 300 mg by mouth 3 (three) times daily.     HYDROcodone -acetaminophen  (NORCO/VICODIN) 5-325 MG tablet Take 1 tablet by mouth every 8 (eight) hours as needed.     HYDROcodone -acetaminophen  (NORCO/VICODIN) 5-325 MG tablet Take 1 tablet by mouth every 8 (eight) hours as needed for 7 days 21  tablet 0   hydrOXYzine  (ATARAX ) 10 MG tablet Take 1 tablet (10 mg total) by mouth 3 (three) times daily as needed. 75 tablet 1   meloxicam  (MOBIC ) 7.5 MG tablet Take 1 tablet (7.5 mg total) by mouth 2 (two) times daily as needed. 60 tablet 1   NIFEdipine  (PROCARDIA -XL/NIFEDICAL-XL) 30 MG 24 hr tablet Take 1 tablet (30 mg total) by mouth daily. 90 tablet 1   omeprazole  (PRILOSEC) 20 MG capsule Take 1 capsule (20 mg total) by mouth 2 (two) times daily before a meal. 60 capsule 3   prazosin  (MINIPRESS ) 2 MG capsule Take 1 capsule (2 mg total) by mouth at bedtime. 30 capsule 2   traZODone  (DESYREL ) 150 MG tablet Take 1 tablet (150 mg total) by mouth at bedtime as needed for sleep. 30 tablet 1   valACYclovir  (VALTREX ) 1000 MG tablet Take 1 tablet (1,000 mg total) by mouth daily. 90 tablet 1   valsartan  (DIOVAN ) 80 MG tablet Take 1 tablet (80 mg total) by mouth daily. 90 tablet 1   albuterol  (VENTOLIN  HFA) 108 (90 Base) MCG/ACT inhaler Inhale 2 puffs into the lungs every 6 (six) hours as needed for wheezing or shortness of breath. (Patient not taking: Reported on 04/24/2024) 8 g 0   cyclobenzaprine  (FLEXERIL ) 10 MG tablet Take 0.5-1 tablets (5-10 mg total) by mouth 2 (two) times daily as needed for muscle spasms. 20 tablet 0   diclofenac  (VOLTAREN ) 75 MG EC tablet Take 1 tablet (75 mg total) by mouth 2 (two) times daily. 30 tablet 2   HYDROcodone -acetaminophen  (NORCO/VICODIN) 5-325 MG tablet Take 1 tablet by mouth daily as needed for severe pain. 30 tablet 0   Semaglutide -Weight Management (WEGOVY ) 1.7 MG/0.75ML SOAJ Inject 1.7 mg into the skin once a week. 3 mL 0   Semaglutide -Weight Management (WEGOVY ) 2.4 MG/0.75ML SOAJ Inject 2.4 mg into the skin once a week. (Patient not taking: Reported on 04/24/2024) 9 mL 1   No current facility-administered medications on file prior to visit.    Allergies  Allergen Reactions   Amlodipine  Swelling    Lower extremity edema.   Hydrochlorothiazide  Other (See  Comments)    Cough   Lamotrigine Hives   Chlorthalidone  Rash and Other (See Comments)    Cramping    Social History   Socioeconomic History   Marital status: Single    Spouse name: Not on file   Number of children: 1   Years of education: Not on file   Highest education level: Some college, no degree  Occupational History   Not on file  Tobacco Use   Smoking status: Former    Current packs/day: 0.25    Types: Cigarettes   Smokeless tobacco: Never  Vaping Use   Vaping status: Never Used  Substance and Sexual Activity   Alcohol use: Yes    Alcohol/week: 2.0 standard drinks of alcohol    Types: 1 Glasses of wine, 1 Cans of beer per week   Drug use: No   Sexual activity: Not Currently    Birth control/protection: Surgical  Other Topics Concern   Not  on file  Social History Narrative   Not on file   Social Drivers of Health   Financial Resource Strain: High Risk (04/20/2024)   Overall Financial Resource Strain (CARDIA)    Difficulty of Paying Living Expenses: Very hard  Food Insecurity: Food Insecurity Present (04/20/2024)   Hunger Vital Sign    Worried About Running Out of Food in the Last Year: Sometimes true    Ran Out of Food in the Last Year: Often true  Transportation Needs: No Transportation Needs (04/20/2024)   PRAPARE - Administrator, Civil Service (Medical): No    Lack of Transportation (Non-Medical): No  Physical Activity: Insufficiently Active (04/20/2024)   Exercise Vital Sign    Days of Exercise per Week: 3 days    Minutes of Exercise per Session: 10 min  Stress: Stress Concern Present (04/20/2024)   [employer] of Occupational Health - Occupational Stress Questionnaire    Feeling of Stress: Very much  Social Connections: Socially Isolated (04/20/2024)   Social Connection and Isolation Panel    Frequency of Communication with Friends and Family: More than three times a week    Frequency of Social Gatherings with Friends and Family: Three  times a week    Attends Religious Services: Never    Active Member of Clubs or Organizations: No    Attends Banker Meetings: Not on file    Marital Status: Never married  Intimate Partner Violence: Not At Risk (11/26/2023)   Humiliation, Afraid, Rape, and Kick questionnaire    Fear of Current or Ex-Partner: No    Emotionally Abused: No    Physically Abused: No    Sexually Abused: No    Family History  Problem Relation Age of Onset   Hypertension Mother    Cancer Mother        breast   Anxiety disorder Mother    Hypertension Father    Schizophrenia Father    Bipolar disorder Father    Anxiety disorder Father    Parkinson's disease Father    Colon cancer Neg Hx    Colon polyps Neg Hx    Esophageal cancer Neg Hx    Rectal cancer Neg Hx    Stomach cancer Neg Hx     Past Surgical History:  Procedure Laterality Date   ABDOMINAL HYSTERECTOMY     ANTERIOR CERVICAL DECOMP/DISCECTOMY FUSION N/A 06/06/2021   Procedure: C4-5, C5-6 ANTERIOR CERVICAL DISCECTOMY FUSION, ALLOGRAFT, PLATE;  Surgeon: Barbarann Oneil BROCKS, MD;  Location: [facility] OR;  Service: Orthopedics;  Laterality: N/A;   CHOLECYSTECTOMY     DILATION AND CURETTAGE OF UTERUS     FOOT SURGERY Bilateral    two pins in each foot   JOINT REPLACEMENT Left 2018   partial knee replacement   KNEE SURGERY Left 2018   partial knee replacement   NECK SURGERY  06/06/2021   C4-5, C5-6 ANTERIOR CERVICAL DISCECTOMY FUSION, ALLOGRAFT, PLATE    ROS: Review of Systems Negative except as stated above  PHYSICAL EXAM: BP 118/84 (BP Location: Left Arm, Patient Position: Sitting, Cuff Size: Large)   Pulse 91   Temp 98.3 F (36.8 C) (Oral)   Resp 16   Ht 5' 5 (1.651 m)   Wt 212 lb (96.2 kg)   SpO2 95%   BMI 35.28 kg/m   Wt Readings from Last 3 Encounters:  04/24/24 212 lb (96.2 kg)  04/07/24 211 lb 1.6 oz (95.8 kg)  03/24/24 213 lb (96.6 kg)    Physical  Exam  General appearance - alert, well appearing, obese  middle-aged African-American female and in no distress Mental status - normal mood, behavior, speech, dress, motor activity, and thought processes Mouth -tongue and lips appear dry. Breast: Patient has very pendulous breasts.    Latest Ref Rng & Units 09/17/2023   11:04 AM 05/24/2023    8:42 AM 03/28/2023   12:45 PM  CMP  Glucose 70 - 99 mg/dL 896  92  95   BUN 6 - 24 mg/dL 17  22  13    Creatinine 0.57 - 1.00 mg/dL 9.30  9.23  9.11   Sodium 134 - 144 mmol/L 145  141  140   Potassium 3.5 - 5.2 mmol/L 5.1  5.2  4.9   Chloride 96 - 106 mmol/L 109  101  102   CO2 20 - 29 mmol/L 24  25  27    Calcium 8.7 - 10.2 mg/dL 8.8  9.2  9.6   Total Protein 6.0 - 8.5 g/dL 5.5   6.8   Total Bilirubin 0.0 - 1.2 mg/dL <9.7   0.8   Alkaline Phos 44 - 121 IU/L 77   88   AST 0 - 40 IU/L 22   21   ALT 0 - 32 IU/L 17   16    Lipid Panel     Component Value Date/Time   CHOL 187 09/17/2023 1104   TRIG 105 09/17/2023 1104   HDL 71 09/17/2023 1104   CHOLHDL 2.6 09/17/2023 1104   LDLCALC 97 09/17/2023 1104    CBC    Component Value Date/Time   WBC 5.4 03/28/2023 1245   RBC 4.50 03/28/2023 1245   HGB 13.9 03/28/2023 1245   HGB 13.3 11/23/2022 1556   HCT 42.5 03/28/2023 1245   HCT 40.6 11/23/2022 1556   PLT 234 03/28/2023 1245   PLT 268 11/23/2022 1556   MCV 94.4 03/28/2023 1245   MCV 88 11/23/2022 1556   MCH 30.9 03/28/2023 1245   MCHC 32.7 03/28/2023 1245   RDW 12.8 03/28/2023 1245   RDW 13.2 11/23/2022 1556   LYMPHSABS 1.5 03/28/2023 1245   MONOABS 0.4 03/28/2023 1245   EOSABS 0.1 03/28/2023 1245   BASOSABS 0.0 03/28/2023 1245    ASSESSMENT AND PLAN: 1. Class 2 severe obesity due to excess calories with serious comorbidity and body mass index (BMI) of 35.0 to 35.9 in adult [facility]) (Primary) Patient on maximum dose of Wegovy  with plateauing weight.  Encouraged her to try incorporate some exercise.  Consider water aerobics.  Continue healthy eating habits. - Hemoglobin A1c  2.  Polydipsia Patient presenting with polydipsia and persistent dry mouth/lips and also endorsing dry eyes.  I think she needs to be checked for Sjogren's syndrome.  We will also check an A1c to make sure she has not progressed to diabetes.  3. Prediabetes See #2 above - Hemoglobin A1c  4. Bilateral pendulous breasts I think she would be a good candidate for breast reduction surgery.  Reports chronic upper back pain associated with pendulous breast. - Ambulatory referral to Plastic Surgery  5. Chronic upper back pain - Ambulatory referral to Plastic Surgery  6. Dry mouth See #2 above - Sjogren's syndrome antibods(ssa + ssb) - carboxymethylcellulose (ARTIFICIAL TEARS) 1 % ophthalmic solution; Apply 1 drop to eye 2 (two) times daily as needed.  Dispense: 30 mL; Refill: 0  7. Dry eye See #2 above - Sjogren's syndrome antibods(ssa + ssb)   Patient was given the opportunity to ask questions.  Patient verbalized understanding of the plan and was able to repeat key elements of the plan.   This documentation was completed using Paediatric nurse.  Any transcriptional errors are unintentional.  No orders of the defined types were placed in this encounter.    Requested Prescriptions    No prescriptions requested or ordered in this encounter    No follow-ups on file.  Barnie Louder, MD, FACP

## 2024-04-25 ENCOUNTER — Encounter: Payer: Self-pay | Admitting: Physical Therapy

## 2024-04-25 ENCOUNTER — Ambulatory Visit: Payer: MEDICAID | Admitting: Physical Therapy

## 2024-04-25 DIAGNOSIS — G8929 Other chronic pain: Secondary | ICD-10-CM

## 2024-04-25 DIAGNOSIS — M6281 Muscle weakness (generalized): Secondary | ICD-10-CM

## 2024-04-25 DIAGNOSIS — M542 Cervicalgia: Secondary | ICD-10-CM | POA: Diagnosis not present

## 2024-04-25 LAB — SJOGREN'S SYNDROME ANTIBODS(SSA + SSB)
ENA SSA (RO) Ab: <0.2 AI (ref 0.0–0.9)
ENA SSB (LA) Ab: <0.2 AI (ref 0.0–0.9)

## 2024-04-25 LAB — HEMOGLOBIN A1C
Est. average glucose Bld gHb Est-mCnc: 117 mg/dL
Hgb A1c MFr Bld: 5.7 % — ABNORMAL HIGH (ref 4.8–5.6)

## 2024-04-25 NOTE — Therapy (Signed)
 OUTPATIENT PHYSICAL THERAPY DAILY NOTE   Patient Name: Theresa Cantrell MRN: [id number] DOB:August 09, 1976, 48 y.o., female Today's Date: 04/25/2024   PT End of Session - 04/25/24 1415     Visit Number 2    Number of Visits --   1-2x/week   Date for PT Re-Evaluation 06/06/24    Authorization Type [employer]    PT Start Time 0215    PT Stop Time 0256    PT Time Calculation (min) 41 min          Past Medical History:  Diagnosis Date   Anxiety    Arthritis    Bipolar 1 disorder (HCC)    Depression    GERD (gastroesophageal reflux disease)    Hypertension    PTSD (post-traumatic stress disorder)    Past Surgical History:  Procedure Laterality Date   ABDOMINAL HYSTERECTOMY     ANTERIOR CERVICAL DECOMP/DISCECTOMY FUSION N/A 06/06/2021   Procedure: C4-5, C5-6 ANTERIOR CERVICAL DISCECTOMY FUSION, ALLOGRAFT, PLATE;  Surgeon: Barbarann Oneil BROCKS, MD;  Location: [facility] OR;  Service: Orthopedics;  Laterality: N/A;   CHOLECYSTECTOMY     DILATION AND CURETTAGE OF UTERUS     FOOT SURGERY Bilateral    two pins in each foot   JOINT REPLACEMENT Left 2018   partial knee replacement   KNEE SURGERY Left 2018   partial knee replacement   NECK SURGERY  06/06/2021   C4-5, C5-6 ANTERIOR CERVICAL DISCECTOMY FUSION, ALLOGRAFT, PLATE   Patient Active Problem List   Diagnosis Date Noted   Gastroesophageal reflux disease with esophagitis without hemorrhage 01/17/2024   Cervical strain, acute, sequela 12/21/2023   Protrusion of lumbar intervertebral disc 03/24/2023   Depression with anxiety 11/30/2022   Partial thickness rotator cuff tear 11/16/2022   Adjustment disorder with mixed anxiety and depressed mood 02/11/2022   Rotator cuff tendinitis, right 11/14/2021   S/P cervical spinal fusion 06/17/2021   COVID-19 vaccine series not completed 12/20/2020   Influenza vaccine refused 11/07/2020   Essential hypertension 11/07/2020   Obesity (BMI 30.0-34.9) 11/07/2020   Tobacco dependence 11/07/2020    Gastroesophageal reflux disease without esophagitis 11/07/2020   History of abnormal cervical Pap smear 11/07/2020   History of bipolar disorder 11/07/2020   Bipolar 2 disorder, major depressive episode (HCC) 06/12/2020   PTSD (post-traumatic stress disorder) 06/12/2020   Chronic pain of left knee 10/05/2019   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   Neuropathy 02/07/2014   Hot flashes 02/07/2014   Weight loss 02/07/2014   Anxiety     PCP: Vicci Barnie NOVAK, MD  REFERRING PROVIDER: Vicci Barnie NOVAK, MD  THERAPY DIAG:  Cervicalgia  Muscle weakness (generalized)  Chronic left shoulder pain  REFERRING DIAG: Radiculopathy, cervical region [M54.12]   Rationale for Evaluation and Treatment:  Rehabilitation  SUBJECTIVE:  PERTINENT PAST HISTORY:  Neuropathy, bipolar, PTSD, cervical spinal fusion C4-C6, depression and anxiety, ptsd, L knee pain with multiple surgeries      PRECAUTIONS: None  WEIGHT BEARING RESTRICTIONS No  FALLS:  Has patient fallen in last 6 months? No, Number of falls: 0  MOI/History of condition:  Onset date: December of 2025  SUBJECTIVE STATEMENT  04/25/2024:  Pt reports that her sxs vary.  She feels like her L side is weaker at times.  EVAL: NYCOLE Cantrell is a 48 y.o. female who presents to clinic with chief complaint of neck pain with bil radicular sxs L>R.  She feels weak at times in the L arm.  Denies any  clumsiness or frequently dropping objects.  She has intermittent n/t in bil hands L>R.  Denies any significant change in gait or balance.   Red flags:  denies   Pain:  Are you having pain? Yes Pain location: neck L>R arm NPRS scale:  Average: 0/10, Worst: 8/10 Aggravating factors: neck movements (L rotation and s/b) Relieving factors: rest Pain description: intermittent  Occupation: NA  Assistive Device: SPC  Hand Dominance: R  Patient Goals/Specific Activities: reduce pain   OBJECTIVE:   DIAGNOSTIC  FINDINGS:  XRs of the cervical spine from 03/24/2024 were independently reviewed and  interpreted, showing anterior cervical instrumentation from C4-C6.  There  appears to be fusion mass spanning the former disc spaces.  No lucency  seen around the screws.  None screws backed out.  Small anterior  osteophyte formation off the inferior aspect of the C3 vertebra.  Disc  height loss at C6/7.  No evidence of instability on flexion/extension  views.  No fracture or dislocation seen.   GENERAL OBSERVATION: Antalgic gait using SPC     SENSATION: Light touch: Appears intact on exam subjective reports of n/t in L>R hand   PALPATION: TTP bil sub occipitals, exquisite TTP L UT  Cervical ROM  ROM ROM  (Eval)  Flexion 30  Extension 30  Right lateral flexion 30  Left lateral flexion 25*  Right rotation 45  Left rotation 40*  Flexion rotation (normal is 30 degrees)   Flexion rotation (normal is 30 degrees)     (Blank rows = not tested, N = WNL, * = concordant pain)  UPPER EXTREMITY MMT:  MMT Right (Eval) Left (Eval)  Shoulder flexion 4 4  Shoulder abduction (C5) c c  Shoulder ER    Shoulder IR    Middle trapezius    Lower trapezius    Shoulder extension    Grip strength 36 lbs 15 lbs  Shoulder shrug (C4) c c  Elbow flexion (C6) c c  Elbow ext (C7) c c  Thumb ext (C8) c c  Finger abd (T1) c c  DNF endurance 9''    (Blank rows = not tested, score listed is out of 5 possible points.  N = WNL, D = diminished, C = clear for gross weakness with myotome testing, * = concordant pain with testing)   SPECIAL TESTS:  Spurling's: + L  Hoffman's:  (-)   JOINT MOBILITY TESTING:  Hypomobile throughout Cx spine  PATIENT SURVEYS:  NDI: 30/50    TODAY'S TREATMENT:   [facility] Adult PT Treatment  04/25/2024:  Therapeutic Exercise: UBE 3'/1' fwd and backward - some increase in n/t sxs in hands Supine horizontal abd - 2x15 - GTB Alternating shoulder flexion in comfortable  range Shoulder ER/IR in supine - 2# weight - 2x10 ea DNF endurance - chin tuck with lift - 5'' x5 Cat camel - not tolerated d/t low back pain Supine non painful arc rotation Light resisted rotation in non painful arc  Manual Therapy  STM bil UT Sub occipital release       HOME EXERCISE PROGRAM: Access Code: [email or web address] URL: [email or web address] Date: 04/11/2024 Prepared by: Helene Gasmen  Exercises - Supine Cervical Retraction with Towel  - 2 x daily - 7 x weekly - 2 sets - 10 reps - 5 second hold - Standing Shoulder Horizontal Abduction with Resistance  - 1 x daily - 7 x weekly - 3 sets - 10 reps - Seated Shoulder Rolls  - 1 x daily - 7 x weekly -  3 sets - 10 reps  Treatment priorities   Eval 6/24       L UT manual/TDN ''       posture ''       DNF endurance ''        SAPS L shoulder?                 ASSESSMENT:  CLINICAL IMPRESSION:  Monicka tolerated session well with no adverse reaction.  Concentrated on cervical and periscapular strengthening combined with gentle cervical ROM.  Cat camel not tolerated d/t low back pain and stopped.  Will explore alternatives for thoracic mobility.  Responded well to manual with significant reduction in pain reported.  EVAL: Halley is a 48 y.o. female who presents to clinic with signs and sxs consistent with neck pain with intermittent radicular sxs.  No signs of gross neurological weakness on exam but pt does have significantly reduced grip strength L vs R.  Consistent with central vs bil foraminal stenosis.  Sxs correspond to x-ray showing height loss at C6-C7.  Levonne will benefit form skilled therapy to address relevant deficits and improve comfort with daily tasks such as driving and lifting.  OBJECTIVE IMPAIRMENTS: Pain, cervical ROM, UE strength  ACTIVITY LIMITATIONS: lifting, head turns while driving, reading  PERSONAL FACTORS: See medical history and pertinent history   REHAB POTENTIAL: Good  CLINICAL  DECISION MAKING: Evolving/moderate complexity  EVALUATION COMPLEXITY: Moderate   GOALS:   SHORT TERM GOALS: Target date: 05/10/2024   Angeletta will be >75% HEP compliant to improve carryover between sessions and facilitate independent management of condition  Evaluation: ongoing Goal status: INITIAL   LONG TERM GOALS: Target date: 06/07/2024   Mardi will self report >/= 50% decrease in pain from evaluation to improve function in daily tasks  Evaluation/Baseline: 8/10 max pain Goal status: INITIAL   2.  Tyona will show a >/= 10 pt improvement in their NDI score (MCID ~20% or 10/50 pts) as a proxy for functional improvement  Evaluation/Baseline: 30/50 pts Goal status: INITIAL   3.  Kayin will show significant improvement in L grip strength  Evaluation/Baseline: 15 lbs Goal status: INITIAL   4.  Aubriee will report <1 instance of N/T in UE at night  Evaluation/Baseline: limited Goal status: INITIAL   PLAN: PT FREQUENCY: 1-2x/week  PT DURATION: 8 weeks  PLANNED INTERVENTIONS:  97164- PT Re-evaluation, 97110-Therapeutic exercises, 97530- Therapeutic activity, [id number]- Neuromuscular re-education, 97535- Self Care, 02859- Manual therapy, [id number]- Gait training, [id number]- Aquatic Therapy, [phone]- Electrical stimulation (manual), [id number]- Vasopneumatic device, [id number]- Traction (mechanical), [id number]- Ionotophoresis 4mg /ml Dexamethasone , Taping, Dry Needling, Joint manipulation, and Spinal manipulation.   Helene Gasmen PT, DPT 04/25/2024, 3:04 PM  I just finished a [facility] eval/recert.  Name: DAMAYA CHANNING  MRN: [id number] Please request 1x/week for 8 weeks.  Check all conditions that are expected to impact treatment: Musculoskeletal disorders   I DID put a charge in.  Check all possible CPT codes: 02889- Therapeutic Exercise, [phone]- Neuro Re-education, [phone] - Gait Training, [phone] - Manual Therapy, 97530 - Therapeutic Activities, 97535 - Self Care, [phone] - Re-evaluation, [id number] -  Mechanical traction, and [id number] - Aquatic therapy   Thank you!  [facility] - Secure

## 2024-04-26 ENCOUNTER — Ambulatory Visit: Payer: Self-pay | Admitting: Internal Medicine

## 2024-04-28 ENCOUNTER — Other Ambulatory Visit: Payer: Self-pay

## 2024-04-28 ENCOUNTER — Ambulatory Visit: Payer: MEDICAID | Admitting: Orthopedic Surgery

## 2024-04-28 DIAGNOSIS — M545 Low back pain, unspecified: Secondary | ICD-10-CM

## 2024-04-28 NOTE — Progress Notes (Signed)
 Orthopedic Spine Surgery Office Note  Assessment: Patient is a 48 y.o. female with low back pain that radiates into bilateral lower extremities along the lateral aspect of the thighs and into the left lateral leg.  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  She does not recall trying a transforaminal injection.  Explained that this would not likely help with her back pain but would likely help with her leg pain.  She is already established with pain management where they do injections, so I told her that we discussed a L5 transforaminal injection as an additional nonoperative treatment to try -We also discussed surgery in the form of an L5/S1 ALIF as a option but she is not in a place to consider surgery -Patient should return to office on an as-needed basis   Patient expressed understanding of the plan and all questions were answered to the patient's satisfaction.   ___________________________________________________________________________   History:  Patient is a 48 y.o. female who presents today for lumbar spine.  I had previously seen the patient about her cervical spine, but today she comes in to talk about her lumbar spine.  She has had several years of low back pain but it is gotten progressively worse with time.  She feels it in the lower lumbar region.  She has also noticed within the last year developing pain in her bilateral lower extremities.  She feels the going into the bilateral buttocks and lateral thighs.  On the left side, it radiates into the left lateral leg as well.  She has been trying multiple medications with pain management but does not notice any lasting relief with those medications.  She has also done facet injections and RFA.  She said they helped her for a couple of days but  no long-term relief with those treatments. Has not had any weakness in her lower extremities. No bowel or bladder incontinence. No saddle anesthesia.   Treatments tried: Tylenol , PT, gabapentin, pain management, facet injections, RFA, bracing   Physical Exam:  BMI of 35.3  General: no acute distress, appears stated age Neurologic: alert, answering questions appropriately, following commands Respiratory: unlabored breathing on room air, symmetric chest rise Psychiatric: appropriate affect, normal cadence to speech   MSK (spine):  -Strength exam      Left  Right EHL    5/5  5/5 TA    5/5  5/5 GSC    5/5  5/5 Knee extension  5/5  5/5 Hip flexion   5/5  5/5  -Sensory exam    Sensation intact to light touch in L3-S1 nerve distributions of bilateral lower extremities  -Straight leg raise: negative bilaterally -Clonus: no beats bilaterally  -Left hip exam: no pain through range of motion -Right hip exam: no pain through range of motion  Imaging: XRs of the lumbar spine from 04/28/2024 were independently reviewed and interpreted, showing lumbar degenerative scoliosis with apex to the left at L2.  Disc height loss at L5/S1.  No other significant degenerative changes seen.  No evidence of instability on flexion/extension views. No fracture or dislocation seen.    MRI of the lumbar spine from 03/20/2023 was independently reviewed and interpreted, showing small central disc herniation at L5/S1 that appears noncompressive.  Bilateral foraminal stenosis (L>R) at L5/S1.  No other significant stenosis seen.   Patient name: Theresa Cantrell  Patient MRN: [id number] Date of visit: 04/28/24

## 2024-05-01 ENCOUNTER — Ambulatory Visit (HOSPITAL_COMMUNITY): Payer: MEDICAID | Admitting: Mental Health

## 2024-05-01 DIAGNOSIS — F419 Anxiety disorder, unspecified: Secondary | ICD-10-CM

## 2024-05-01 NOTE — Progress Notes (Signed)
    4:11pm ( 10 minutes- non bill) Pt connected to tele-therapy session at 4:11pm. Notes to have been in need of canceling and rescheduling as she was in route to father's home and unable to hold session.Shares with therapist update of ongoing medical concerns and newly diagnosed scoliosis. Shares ongoing financial stressors with inability to work. Notes support of family and friends. Denies safety concerns and agrees to follow up on 06/29/24 @ 4 pm.  Ty Bernice Savant, [facility] 05/01/2024

## 2024-05-02 ENCOUNTER — Other Ambulatory Visit: Payer: Self-pay

## 2024-05-03 ENCOUNTER — Encounter: Payer: Self-pay | Admitting: Physical Therapy

## 2024-05-03 ENCOUNTER — Other Ambulatory Visit (HOSPITAL_COMMUNITY): Payer: Self-pay

## 2024-05-03 ENCOUNTER — Ambulatory Visit: Payer: MEDICAID | Attending: Orthopedic Surgery | Admitting: Physical Therapy

## 2024-05-03 DIAGNOSIS — M25512 Pain in left shoulder: Secondary | ICD-10-CM | POA: Insufficient documentation

## 2024-05-03 DIAGNOSIS — M6281 Muscle weakness (generalized): Secondary | ICD-10-CM | POA: Diagnosis present

## 2024-05-03 DIAGNOSIS — G8929 Other chronic pain: Secondary | ICD-10-CM | POA: Diagnosis present

## 2024-05-03 DIAGNOSIS — M542 Cervicalgia: Secondary | ICD-10-CM | POA: Insufficient documentation

## 2024-05-03 NOTE — Therapy (Signed)
 OUTPATIENT PHYSICAL THERAPY DAILY NOTE   Patient Name: Theresa Cantrell MRN: [id number] DOB:1976/04/05, 48 y.o., female Today's Date: 05/03/2024   PT End of Session - 05/03/24 1455     Visit Number 3    Number of Visits --   1-2x/week   Date for PT Re-Evaluation 06/06/24    Authorization Type [employer]    Authorization Time Period 04/11/24-05/27/24    Authorization - Visit Number 3    Authorization - Number of Visits 12    PT Start Time 0252    PT Stop Time 0332    PT Time Calculation (min) 40 min          Past Medical History:  Diagnosis Date   Anxiety    Arthritis    Bipolar 1 disorder (HCC)    Depression    GERD (gastroesophageal reflux disease)    Hypertension    PTSD (post-traumatic stress disorder)    Past Surgical History:  Procedure Laterality Date   ABDOMINAL HYSTERECTOMY     ANTERIOR CERVICAL DECOMP/DISCECTOMY FUSION N/A 06/06/2021   Procedure: C4-5, C5-6 ANTERIOR CERVICAL DISCECTOMY FUSION, ALLOGRAFT, PLATE;  Surgeon: Barbarann Oneil BROCKS, MD;  Location: [facility] OR;  Service: Orthopedics;  Laterality: N/A;   CHOLECYSTECTOMY     DILATION AND CURETTAGE OF UTERUS     FOOT SURGERY Bilateral    two pins in each foot   JOINT REPLACEMENT Left 2018   partial knee replacement   KNEE SURGERY Left 2018   partial knee replacement   NECK SURGERY  06/06/2021   C4-5, C5-6 ANTERIOR CERVICAL DISCECTOMY FUSION, ALLOGRAFT, PLATE   Patient Active Problem List   Diagnosis Date Noted   Gastroesophageal reflux disease with esophagitis without hemorrhage 01/17/2024   Cervical strain, acute, sequela 12/21/2023   Protrusion of lumbar intervertebral disc 03/24/2023   Depression with anxiety 11/30/2022   Partial thickness rotator cuff tear 11/16/2022   Adjustment disorder with mixed anxiety and depressed mood 02/11/2022   Rotator cuff tendinitis, right 11/14/2021   S/P cervical spinal fusion 06/17/2021   COVID-19 vaccine series not completed 12/20/2020   Influenza vaccine refused  11/07/2020   Essential hypertension 11/07/2020   Obesity (BMI 30.0-34.9) 11/07/2020   Tobacco dependence 11/07/2020   Gastroesophageal reflux disease without esophagitis 11/07/2020   History of abnormal cervical Pap smear 11/07/2020   History of bipolar disorder 11/07/2020   Bipolar 2 disorder, major depressive episode (HCC) 06/12/2020   PTSD (post-traumatic stress disorder) 06/12/2020   Chronic pain of left knee 10/05/2019   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   Neuropathy 02/07/2014   Hot flashes 02/07/2014   Weight loss 02/07/2014   Anxiety     PCP: Vicci Barnie NOVAK, MD  REFERRING PROVIDER: Georgina Ozell LABOR, MD  THERAPY DIAG:  Cervicalgia  Muscle weakness (generalized)  REFERRING DIAG: Radiculopathy, cervical region [M54.12]   Rationale for Evaluation and Treatment:  Rehabilitation  SUBJECTIVE:  PERTINENT PAST HISTORY:  Neuropathy, bipolar, PTSD, cervical spinal fusion C4-C6, depression and anxiety, ptsd, L knee pain with multiple surgeries      PRECAUTIONS: None  WEIGHT BEARING RESTRICTIONS No  FALLS:  Has patient fallen in last 6 months? No, Number of falls: 0  MOI/History of condition:  Onset date: December of 2025  SUBJECTIVE STATEMENT  05/03/2024:  Left neck pain today. 4/10 pain.   EVAL: Theresa Cantrell is a 49 y.o. female who presents to clinic with chief complaint of neck pain with bil radicular sxs L>R.  She feels weak  at times in the L arm.  Denies any clumsiness or frequently dropping objects.  She has intermittent n/t in bil hands L>R.  Denies any significant change in gait or balance.   Red flags:  denies   Pain:  Are you having pain? Yes Pain location: neck L>R arm NPRS scale:  Average: 0/10, Worst: 8/10 Aggravating factors: neck movements (L rotation and s/b) Relieving factors: rest Pain description: intermittent  Occupation: NA  Assistive Device: SPC  Hand Dominance: R  Patient Goals/Specific  Activities: reduce pain   OBJECTIVE:   DIAGNOSTIC FINDINGS:  XRs of the cervical spine from 03/24/2024 were independently reviewed and  interpreted, showing anterior cervical instrumentation from C4-C6.  There  appears to be fusion mass spanning the former disc spaces.  No lucency  seen around the screws.  None screws backed out.  Small anterior  osteophyte formation off the inferior aspect of the C3 vertebra.  Disc  height loss at C6/7.  No evidence of instability on flexion/extension  views.  No fracture or dislocation seen.   GENERAL OBSERVATION: Antalgic gait using SPC     SENSATION: Light touch: Appears intact on exam subjective reports of n/t in L>R hand   PALPATION: TTP bil sub occipitals, exquisite TTP L UT  Cervical ROM  ROM ROM  (Eval)  Flexion 30  Extension 30  Right lateral flexion 30  Left lateral flexion 25*  Right rotation 45  Left rotation 40*  Flexion rotation (normal is 30 degrees)   Flexion rotation (normal is 30 degrees)     (Blank rows = not tested, N = WNL, * = concordant pain)  UPPER EXTREMITY MMT:  MMT Right (Eval) Left (Eval)  Shoulder flexion 4 4  Shoulder abduction (C5) c c  Shoulder ER    Shoulder IR    Middle trapezius    Lower trapezius    Shoulder extension    Grip strength 36 lbs 15 lbs  Shoulder shrug (C4) c c  Elbow flexion (C6) c c  Elbow ext (C7) c c  Thumb ext (C8) c c  Finger abd (T1) c c  DNF endurance 9''    (Blank rows = not tested, score listed is out of 5 possible points.  N = WNL, D = diminished, C = clear for gross weakness with myotome testing, * = concordant pain with testing)   SPECIAL TESTS:  Spurling's: + L  Hoffman's:  (-)   JOINT MOBILITY TESTING:  Hypomobile throughout Cx spine  PATIENT SURVEYS:  NDI: 30/50    TODAY'S TREATMENT:  [facility] Adult PT Treatment:                                                DATE: 05/03/24 Therapeutic Exercise: UBE 3'/1' fwd and backward - some increase in n/t sxs in  hands Standing scap retraction x 10 Standing W back x 10 Supine chin tuck x 5 , added arm circles holding chin tuck  Supine horizontal abd - 2x15 - GTB DNF lift 3 sec x 6  Manual Therapy: STW bilateral upper traps   Self Care: Log roll for transitions sit  <->supine     [facility] Adult PT Treatment  04/25/2024:  Therapeutic Exercise: UBE 3'/1' fwd and backward - some increase in n/t sxs in hands Supine horizontal abd - 2x15 - GTB Alternating shoulder flexion in comfortable  range Shoulder ER/IR in supine - 2# weight - 2x10 ea DNF endurance - chin tuck with lift - 5'' x5 Cat camel - not tolerated d/t low back pain Supine non painful arc rotation Light resisted rotation in non painful arc  Manual Therapy  STM bil UT Sub occipital release       HOME EXERCISE PROGRAM: Access Code: [email or web address] URL: [email or web address] Date: 04/11/2024 Prepared by: Helene Gasmen  Exercises - Supine Cervical Retraction with Towel  - 2 x daily - 7 x weekly - 2 sets - 10 reps - 5 second hold - Standing Shoulder Horizontal Abduction with Resistance  - 1 x daily - 7 x weekly - 3 sets - 10 reps - Seated Shoulder Rolls  - 1 x daily - 7 x weekly - 3 sets - 10 reps  Treatment priorities   Eval 6/24       L UT manual/TDN ''       posture ''       DNF endurance ''        SAPS L shoulder?                 ASSESSMENT:  CLINICAL IMPRESSION:  Theresa Cantrell tolerated session well with no adverse reaction.  Concentrated on cervical and periscapular strengthening combined with gentle cervical ROM.  Time spent with education on log roll after increased pain during her normal transfer. Pt very appreciative to suggestions and felt reduced back pain. Will plan to discuss other body mechanics modifications and give handout at future sessions.   EVAL: Theresa Cantrell is a 48 y.o. female who presents to clinic with signs and sxs consistent with neck pain with intermittent radicular sxs.  No signs of gross  neurological weakness on exam but pt does have significantly reduced grip strength L vs R.  Consistent with central vs bil foraminal stenosis.  Sxs correspond to x-ray showing height loss at C6-C7.  Theresa Cantrell will benefit form skilled therapy to address relevant deficits and improve comfort with daily tasks such as driving and lifting.  OBJECTIVE IMPAIRMENTS: Pain, cervical ROM, UE strength  ACTIVITY LIMITATIONS: lifting, head turns while driving, reading  PERSONAL FACTORS: See medical history and pertinent history   REHAB POTENTIAL: Good  CLINICAL DECISION MAKING: Evolving/moderate complexity  EVALUATION COMPLEXITY: Moderate   GOALS:   SHORT TERM GOALS: Target date: 05/10/2024   Theresa Cantrell will be >75% HEP compliant to improve carryover between sessions and facilitate independent management of condition  Evaluation: ongoing Goal status: INITIAL   LONG TERM GOALS: Target date: 06/07/2024   Theresa Cantrell will self report >/= 50% decrease in pain from evaluation to improve function in daily tasks  Evaluation/Baseline: 8/10 max pain Goal status: INITIAL   2.  Theresa Cantrell will show a >/= 10 pt improvement in their NDI score (MCID ~20% or 10/50 pts) as a proxy for functional improvement  Evaluation/Baseline: 30/50 pts Goal status: INITIAL   3.  Theresa Cantrell will show significant improvement in L grip strength  Evaluation/Baseline: 15 lbs Goal status: INITIAL   4.  Theresa Cantrell will report <1 instance of N/T in UE at night  Evaluation/Baseline: limited Goal status: INITIAL   PLAN: PT FREQUENCY: 1-2x/week  PT DURATION: 8 weeks  PLANNED INTERVENTIONS:  [id number]- PT Re-evaluation, 97110-Therapeutic exercises, 97530- Therapeutic activity, [id number]- Neuromuscular re-education, 97535- Self Care, 02859- Manual therapy, [id number]- Gait training, [id number]- Aquatic Therapy, [phone]- Electrical stimulation (manual), [id number]- Vasopneumatic device, [id number]- Traction (mechanical), [id number]- Ionotophoresis 4mg /ml Dexamethasone ,  Taping, Dry Needling, Joint manipulation, and Spinal manipulation.  Harlene Persons, PTA 05/03/24 3:41 PM Phone: [phone] Fax: [phone]   I just finished a [facility] eval/recert.  Name: Theresa Cantrell  MRN: [id number] Please request 1x/week for 8 weeks.  Check all conditions that are expected to impact treatment: Musculoskeletal disorders   I DID put a charge in.  Check all possible CPT codes: 02889- Therapeutic Exercise, [phone]- Neuro Re-education, [phone] - Gait Training, [phone] - Manual Therapy, 97530 - Therapeutic Activities, 97535 - Self Care, [phone] - Re-evaluation, [id number] - Mechanical traction, and [id number] - Aquatic therapy   Thank you!  [facility] - Secure

## 2024-05-08 ENCOUNTER — Other Ambulatory Visit (HOSPITAL_COMMUNITY): Payer: Self-pay

## 2024-05-08 ENCOUNTER — Other Ambulatory Visit: Payer: Self-pay | Admitting: Internal Medicine

## 2024-05-08 ENCOUNTER — Other Ambulatory Visit: Payer: Self-pay

## 2024-05-08 MED ORDER — VITAMIN D 50 MCG (2000 UT) PO TABS
2000.0000 [IU] | ORAL_TABLET | Freq: Every day | ORAL | 1 refills | Status: DC
  Filled 2024-05-08: qty 100, 100d supply, fill #0
  Filled 2024-05-08: qty 90, 90d supply, fill #0
  Filled 2024-07-12 – 2024-08-25 (×2): qty 100, 100d supply, fill #1

## 2024-05-09 ENCOUNTER — Encounter: Payer: Self-pay | Admitting: Physical Therapy

## 2024-05-09 ENCOUNTER — Other Ambulatory Visit (HOSPITAL_COMMUNITY): Payer: Self-pay

## 2024-05-09 ENCOUNTER — Ambulatory Visit: Payer: MEDICAID | Admitting: Physical Therapy

## 2024-05-09 DIAGNOSIS — M542 Cervicalgia: Secondary | ICD-10-CM | POA: Diagnosis not present

## 2024-05-09 DIAGNOSIS — G8929 Other chronic pain: Secondary | ICD-10-CM

## 2024-05-09 DIAGNOSIS — M6281 Muscle weakness (generalized): Secondary | ICD-10-CM

## 2024-05-09 NOTE — Therapy (Signed)
 OUTPATIENT PHYSICAL THERAPY DAILY NOTE   Patient Name: Theresa Cantrell MRN: [id number] DOB:1976/06/05, 48 y.o., female Today's Date: 05/09/2024   PT End of Session - 05/09/24 1417     Visit Number 4    Number of Visits --   1-2x/week   Date for PT Re-Evaluation 06/06/24    Authorization Type [employer]    Authorization Time Period 04/11/24-05/27/24    Authorization - Visit Number 4    Authorization - Number of Visits 12    PT Start Time 1417    PT Stop Time 1457    PT Time Calculation (min) 40 min          Past Medical History:  Diagnosis Date   Anxiety    Arthritis    Bipolar 1 disorder (HCC)    Depression    GERD (gastroesophageal reflux disease)    Hypertension    PTSD (post-traumatic stress disorder)    Past Surgical History:  Procedure Laterality Date   ABDOMINAL HYSTERECTOMY     ANTERIOR CERVICAL DECOMP/DISCECTOMY FUSION N/A 06/06/2021   Procedure: C4-5, C5-6 ANTERIOR CERVICAL DISCECTOMY FUSION, ALLOGRAFT, PLATE;  Surgeon: Barbarann Oneil BROCKS, MD;  Location: [facility] OR;  Service: Orthopedics;  Laterality: N/A;   CHOLECYSTECTOMY     DILATION AND CURETTAGE OF UTERUS     FOOT SURGERY Bilateral    two pins in each foot   JOINT REPLACEMENT Left 2018   partial knee replacement   KNEE SURGERY Left 2018   partial knee replacement   NECK SURGERY  06/06/2021   C4-5, C5-6 ANTERIOR CERVICAL DISCECTOMY FUSION, ALLOGRAFT, PLATE   Patient Active Problem List   Diagnosis Date Noted   Gastroesophageal reflux disease with esophagitis without hemorrhage 01/17/2024   Cervical strain, acute, sequela 12/21/2023   Protrusion of lumbar intervertebral disc 03/24/2023   Depression with anxiety 11/30/2022   Partial thickness rotator cuff tear 11/16/2022   Adjustment disorder with mixed anxiety and depressed mood 02/11/2022   Rotator cuff tendinitis, right 11/14/2021   S/P cervical spinal fusion 06/17/2021   COVID-19 vaccine series not completed 12/20/2020   Influenza vaccine refused  11/07/2020   Essential hypertension 11/07/2020   Obesity (BMI 30.0-34.9) 11/07/2020   Tobacco dependence 11/07/2020   Gastroesophageal reflux disease without esophagitis 11/07/2020   History of abnormal cervical Pap smear 11/07/2020   History of bipolar disorder 11/07/2020   Bipolar 2 disorder, major depressive episode (HCC) 06/12/2020   PTSD (post-traumatic stress disorder) 06/12/2020   Chronic pain of left knee 10/05/2019   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   Neuropathy 02/07/2014   Hot flashes 02/07/2014   Weight loss 02/07/2014   Anxiety     PCP: Vicci Barnie NOVAK, MD  REFERRING PROVIDER: Georgina Ozell LABOR, MD  THERAPY DIAG:  Cervicalgia  Muscle weakness (generalized)  Chronic left shoulder pain  REFERRING DIAG: Radiculopathy, cervical region [M54.12]   Rationale for Evaluation and Treatment:  Rehabilitation  SUBJECTIVE:  PERTINENT PAST HISTORY:  Neuropathy, bipolar, PTSD, cervical spinal fusion C4-C6, depression and anxiety, ptsd, L knee pain with multiple surgeries      PRECAUTIONS: None  WEIGHT BEARING RESTRICTIONS No  FALLS:  Has patient fallen in last 6 months? No, Number of falls: 0  MOI/History of condition:  Onset date: December of 2025  SUBJECTIVE STATEMENT  05/09/2024:  Pt reports that she see's some improvement.  She rates her neck pain 4/10 currently.  EVAL: Theresa Cantrell is a 48 y.o. female who presents to clinic with chief  complaint of neck pain with bil radicular sxs L>R.  She feels weak at times in the L arm.  Denies any clumsiness or frequently dropping objects.  She has intermittent n/t in bil hands L>R.  Denies any significant change in gait or balance.   Red flags:  denies   Pain:  Are you having pain? 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  There  appears to be fusion mass spanning the former disc spaces.  No lucency  seen around the screws.  None screws backed out.  Small anterior  osteophyte formation off the inferior aspect of the C3 vertebra.  Disc  height loss at C6/7.  No evidence of instability on flexion/extension  views.  No fracture or dislocation seen.   GENERAL OBSERVATION: Antalgic gait using SPC     SENSATION: Light touch: Appears intact on exam subjective reports of n/t in L>R hand   PALPATION: TTP bil sub occipitals, exquisite TTP L UT  Cervical ROM  ROM ROM  (Eval)  Flexion 30  Extension 30  Right lateral flexion 30  Left lateral flexion 25*  Right rotation 45  Left rotation 40*  Flexion rotation (normal is 30 degrees)   Flexion rotation (normal is 30 degrees)     (Blank rows = not tested, N = WNL, * = concordant pain)  UPPER EXTREMITY MMT:  MMT Right (Eval) Left (Eval)  Shoulder flexion 4 4  Shoulder abduction (C5) c c  Shoulder ER    Shoulder IR    Middle trapezius    Lower trapezius    Shoulder extension    Grip strength 36 lbs 15 lbs  Shoulder shrug (C4) c c  Elbow flexion (C6) c c  Elbow ext (C7) c c  Thumb ext (C8) c c  Finger abd (T1) c c  DNF endurance 9''    (Blank rows = not tested, score listed is out of 5 possible points.  N = WNL, D = diminished, C = clear for gross weakness with myotome testing, * = concordant pain with testing)   SPECIAL TESTS:  Spurling's: + L  Hoffman's:  (-)   JOINT MOBILITY TESTING:  Hypomobile throughout Cx spine  PATIENT SURVEYS:  NDI: 30/50    TODAY'S TREATMENT:  [facility] Adult PT Treatment:                                                DATE:  05/09/24 Therapeutic Exercise: UBE 5'/1' fwd and backward - some increase in n/t sxs in hands Standing shoulder rolls 2 x 10 Standing W back 2 x 10 Supine chin tuck x 5 , added arm circles holding chin tuck  Seated bil ER - 2x8 Supine horizontal abd - 2x15 - RTB Bus driver - 1x to failure Cheerleaders - 2x5 DNF lift 6 sec x 5  Manual Therapy: STW bilateral upper traps and L scalenes   [facility] Adult PT Treatment  04/25/2024:  Therapeutic Exercise: UBE 3'/1' fwd and backward -  some increase in n/t sxs in hands Supine horizontal abd - 2x15 - GTB Alternating shoulder flexion in comfortable range Shoulder ER/IR in supine - 2# weight - 2x10 ea DNF endurance - chin tuck with lift - 5'' x5 Cat camel - not tolerated d/t low back pain Supine non painful arc rotation Light resisted rotation in non painful arc  Manual Therapy  STM bil UT Sub occipital release       HOME EXERCISE PROGRAM: Access Code: [email or web address] URL: [email or web address] Date: 04/11/2024 Prepared by: Theresa Gasmen  Exercises - Supine Cervical Retraction with Towel  - 2 x daily - 7 x weekly - 2 sets - 10 reps - 5 second hold - Standing Shoulder Horizontal Abduction with Resistance  - 1 x daily - 7 x weekly - 3 sets - 10 reps - Seated Shoulder Rolls  - 1 x daily - 7 x weekly - 3 sets - 10 reps  Treatment priorities   Eval 6/24       L UT manual/TDN ''       posture ''       DNF endurance ''        SAPS L shoulder?                 ASSESSMENT:  CLINICAL IMPRESSION:  Theresa Cantrell tolerated session well with no adverse reaction.  Pt with relatively low levels of neck pain reported today and reports reduced frequency of UE n/t at night.  Concentrated on STM of L scalenes today which eliminated concordant pain with R rotation.  Encouraged her to start working on DNF endurance at home.  EVAL: Theresa Cantrell is a 48 y.o. female who presents to clinic with signs and sxs consistent with neck pain with intermittent  radicular sxs.  No signs of gross neurological weakness on exam but pt does have significantly reduced grip strength L vs R.  Consistent with central vs bil foraminal stenosis.  Sxs correspond to x-ray showing height loss at C6-C7.  Theresa Cantrell will benefit form skilled therapy to address relevant deficits and improve comfort with daily tasks such as driving and lifting.  OBJECTIVE IMPAIRMENTS: Pain, cervical ROM, UE strength  ACTIVITY LIMITATIONS: lifting, head turns while driving, reading  PERSONAL FACTORS: See medical history and pertinent history   REHAB POTENTIAL: Good  CLINICAL DECISION MAKING: Evolving/moderate complexity  EVALUATION COMPLEXITY: Moderate   GOALS:   SHORT TERM GOALS: Target date: 05/10/2024   Theresa Cantrell will be >75% HEP compliant to improve carryover between sessions and facilitate independent management of condition  Evaluation: ongoing Goal status: MET   LONG TERM GOALS: Target date: 06/07/2024   Theresa Cantrell will self report >/= 50% decrease in pain from evaluation to improve function in daily tasks  Evaluation/Baseline: 8/10 max pain Goal status: INITIAL   2.  Theresa Cantrell will show a >/= 10 pt improvement in their NDI score (MCID ~20% or 10/50 pts) as a proxy for functional improvement  Evaluation/Baseline: 30/50 pts Goal status: INITIAL   3.  Theresa Cantrell will show significant improvement in L grip strength  Evaluation/Baseline: 15 lbs Goal status: INITIAL   4.  Theresa Cantrell will report <1 instance of N/T in UE at night  Evaluation/Baseline: limited Goal status: INITIAL   PLAN: PT FREQUENCY: 1-2x/week  PT DURATION: 8 weeks  PLANNED INTERVENTIONS:  97164- PT Re-evaluation, 97110-Therapeutic exercises, 97530- Therapeutic activity, [id number]- Neuromuscular re-education, 97535- Self Care, 02859- Manual therapy, [id number]- Gait training, [id number]- Aquatic Therapy, [phone]- Electrical stimulation (manual), [id number]- Vasopneumatic device, [id number]- Traction (mechanical), [id number]-  Ionotophoresis 4mg /ml Dexamethasone , Taping, Dry Needling, Joint manipulation, and Spinal manipulation.   Theresa Cantrell PT 05/09/24 3:03 PM Phone: [phone] Fax: [phone]   I just finished a [facility] eval/recert.  Name: Theresa Cantrell  MRN: [id number] Please request 1x/week for 8 weeks.  Check all conditions that are expected to impact treatment: Musculoskeletal disorders   I DID put a charge in.  Check all possible CPT codes: 02889- Therapeutic Exercise, [phone]- Neuro Re-education, [phone] - Gait Training, [phone] - Manual Therapy, 97530 - Therapeutic Activities, 97535 - Self Care, [phone] - Re-evaluation, [id number] - Mechanical traction, and [id number] - Aquatic therapy   Thank you!  [facility] - Secure

## 2024-05-10 ENCOUNTER — Other Ambulatory Visit (HOSPITAL_COMMUNITY): Payer: Self-pay

## 2024-05-12 ENCOUNTER — Other Ambulatory Visit (HOSPITAL_COMMUNITY): Payer: Self-pay

## 2024-05-12 MED ORDER — HYDROCODONE-ACETAMINOPHEN 5-325 MG PO TABS
1.0000 | ORAL_TABLET | Freq: Three times a day (TID) | ORAL | 0 refills | Status: AC | PRN
  Filled 2024-05-12 – 2024-05-17 (×3): qty 90, 30d supply, fill #0

## 2024-05-15 ENCOUNTER — Other Ambulatory Visit (HOSPITAL_COMMUNITY): Payer: Self-pay

## 2024-05-15 ENCOUNTER — Ambulatory Visit: Payer: MEDICAID | Admitting: Plastic Surgery

## 2024-05-15 ENCOUNTER — Encounter: Payer: Self-pay | Admitting: Plastic Surgery

## 2024-05-15 VITALS — BP 130/76 | HR 99 | Ht 65.0 in | Wt 210.0 lb

## 2024-05-15 DIAGNOSIS — Z803 Family history of malignant neoplasm of breast: Secondary | ICD-10-CM

## 2024-05-15 DIAGNOSIS — G629 Polyneuropathy, unspecified: Secondary | ICD-10-CM

## 2024-05-15 DIAGNOSIS — M546 Pain in thoracic spine: Secondary | ICD-10-CM | POA: Diagnosis not present

## 2024-05-15 DIAGNOSIS — F4323 Adjustment disorder with mixed anxiety and depressed mood: Secondary | ICD-10-CM

## 2024-05-15 DIAGNOSIS — M75111 Incomplete rotator cuff tear or rupture of right shoulder, not specified as traumatic: Secondary | ICD-10-CM

## 2024-05-15 DIAGNOSIS — E669 Obesity, unspecified: Secondary | ICD-10-CM

## 2024-05-15 DIAGNOSIS — Z6834 Body mass index (BMI) 34.0-34.9, adult: Secondary | ICD-10-CM

## 2024-05-15 DIAGNOSIS — N62 Hypertrophy of breast: Secondary | ICD-10-CM | POA: Diagnosis not present

## 2024-05-15 DIAGNOSIS — E66811 Obesity, class 1: Secondary | ICD-10-CM

## 2024-05-15 DIAGNOSIS — F3181 Bipolar II disorder: Secondary | ICD-10-CM

## 2024-05-15 DIAGNOSIS — G8929 Other chronic pain: Secondary | ICD-10-CM

## 2024-05-15 DIAGNOSIS — M549 Dorsalgia, unspecified: Secondary | ICD-10-CM | POA: Insufficient documentation

## 2024-05-15 NOTE — Progress Notes (Signed)
 Patient ID: Theresa Cantrell, female    DOB: October 22, 1976, 48 y.o.   MRN: [id number]   Chief Complaint  Patient presents with   consult    Mammary Hyperplasia: The patient is a 48 y.o. female with a history of mammary hyperplasia for several years.  She has extremely large breasts causing symptoms that include the following: Back pain in the upper and lower back, including neck pain. She pulls or pins her bra straps to provide better lift and relief of the pressure and pain. She notices relief by holding her breast up manually.  Her shoulder straps cause grooves and pain and pressure that requires padding for relief. Pain medication is sometimes required with motrin  and tylenol .  Activities that are hindered by enlarged breasts include: exercise and running.  She has tried supportive clothing as well as fitted bras without improvement.  Her breasts are extremely large and fairly symmetric.  She has hyperpigmentation of the inframammary area on both sides.  The sternal to nipple distance on the right is 34 cm and the left is 34 cm.  The IMF distance is 23 cm.  She is 5 feet 5 inches tall and weighs 210 pounds.  The BMI = 34.9 kg/m.  Preoperative bra size = 42 DD/DDD cup.  The estimated excess breast tissue to be removed at the time of surgery = 650-700 grams on the left and 650-700 grams on the right.  Mammogram history: 2025 negative.  Family history of breast cancer:  mom.  Tobacco use:  quite 3 years ago.   The patient expresses the desire to pursue surgical intervention. She had PT in the past without improvement in pain.     Review of Systems  Constitutional: Negative.   HENT: Negative.    Eyes: Negative.   Respiratory: Negative.    Cardiovascular: Negative.   Gastrointestinal: Negative.   Endocrine: Negative.   Genitourinary: Negative.   Musculoskeletal:  Positive for back pain and neck pain.  Skin:  Positive for color change and rash.    Past Medical History:  Diagnosis Date    Anxiety    Arthritis    Bipolar 1 disorder (HCC)    Depression    GERD (gastroesophageal reflux disease)    Hypertension    PTSD (post-traumatic stress disorder)     Past Surgical History:  Procedure Laterality Date   ABDOMINAL HYSTERECTOMY     ANTERIOR CERVICAL DECOMP/DISCECTOMY FUSION N/A 06/06/2021   Procedure: C4-5, C5-6 ANTERIOR CERVICAL DISCECTOMY FUSION, ALLOGRAFT, PLATE;  Surgeon: Barbarann Oneil BROCKS, MD;  Location: [facility] OR;  Service: Orthopedics;  Laterality: N/A;   CHOLECYSTECTOMY     DILATION AND CURETTAGE OF UTERUS     FOOT SURGERY Bilateral    two pins in each foot   JOINT REPLACEMENT Left 2018   partial knee replacement   KNEE SURGERY Left 2018   partial knee replacement   NECK SURGERY  06/06/2021   C4-5, C5-6 ANTERIOR CERVICAL DISCECTOMY FUSION, ALLOGRAFT, PLATE      Current Outpatient Medications:    ARIPiprazole  (ABILIFY ) 15 MG tablet, Take 1 tablet (15 mg total) by mouth daily., Disp: 30 tablet, Rfl: 2   carboxymethylcellulose (ARTIFICIAL TEARS) 1 % ophthalmic solution, Apply 1 drop to eye 2 (two) times daily as needed., Disp: 30 mL, Rfl: 0   cyclobenzaprine  (FLEXERIL ) 10 MG tablet, Take 0.5-1 tablets (5-10 mg total) by mouth 2 (two) times daily as needed for muscle spasms., Disp: 20 tablet, Rfl: 0   furosemide  (  LASIX ) 20 MG tablet, TAKE 1 TABLET BY MOUTH ON MONDAY, WEDNESDAY AND FRIDAY, Disp: 36 tablet, Rfl: 1   gabapentin (NEURONTIN) 300 MG capsule, Take 300 mg by mouth 3 (three) times daily., Disp: , Rfl:    HYDROcodone -acetaminophen  (NORCO/VICODIN) 5-325 MG tablet, Take 1 tablet by mouth every 8 (eight) hours as needed., Disp: , Rfl:    HYDROcodone -acetaminophen  (NORCO/VICODIN) 5-325 MG tablet, Take 1 tablet by mouth every 8 (eight) hours as needed for pain up to 30 days., Disp: 90 tablet, Rfl: 0   hydrOXYzine  (ATARAX ) 10 MG tablet, Take 1 tablet (10 mg total) by mouth 3 (three) times daily as needed., Disp: 75 tablet, Rfl: 1   meloxicam  (MOBIC ) 7.5 MG tablet, Take 1  tablet (7.5 mg total) by mouth 2 (two) times daily as needed., Disp: 60 tablet, Rfl: 1   NIFEdipine  (PROCARDIA -XL/NIFEDICAL-XL) 30 MG 24 hr tablet, Take 1 tablet (30 mg total) by mouth daily., Disp: 90 tablet, Rfl: 1   omeprazole  (PRILOSEC) 20 MG capsule, Take 1 capsule (20 mg total) by mouth 2 (two) times daily before a meal., Disp: 60 capsule, Rfl: 3   prazosin  (MINIPRESS ) 2 MG capsule, Take 1 capsule (2 mg total) by mouth at bedtime., Disp: 30 capsule, Rfl: 2   Semaglutide -Weight Management (WEGOVY ) 2.4 MG/0.75ML SOAJ, Inject 2.4 mg into the skin once a week., Disp: 9 mL, Rfl: 1   traZODone  (DESYREL ) 150 MG tablet, Take 1 tablet (150 mg total) by mouth at bedtime as needed for sleep., Disp: 30 tablet, Rfl: 1   valACYclovir  (VALTREX ) 1000 MG tablet, Take 1 tablet (1,000 mg total) by mouth daily., Disp: 90 tablet, Rfl: 1   valsartan  (DIOVAN ) 80 MG tablet, Take 1 tablet (80 mg total) by mouth daily., Disp: 90 tablet, Rfl: 1   albuterol  (VENTOLIN  HFA) 108 (90 Base) MCG/ACT inhaler, Inhale 2 puffs into the lungs every 6 (six) hours as needed for wheezing or shortness of breath. (Patient not taking: Reported on 04/24/2024), Disp: 8 g, Rfl: 0   Cholecalciferol  (VITAMIN D ) 50 MCG (2000 UT) tablet, Take 1 tablet (2,000 Units total) by mouth daily., Disp: 100 tablet, Rfl: 1   cyclobenzaprine  (FLEXERIL ) 10 MG tablet, Take 1 tablet (10 mg total) by mouth every 12 (twelve) hours as needed for spasms, Disp: 60 tablet, Rfl: 1   diclofenac  (VOLTAREN ) 75 MG EC tablet, Take 1 tablet (75 mg total) by mouth 2 (two) times daily., Disp: 30 tablet, Rfl: 2   FLUoxetine  (PROZAC ) 20 MG capsule, Take 1 capsule (20 mg total) by mouth daily., Disp: 30 capsule, Rfl: 2   HYDROcodone -acetaminophen  (NORCO/VICODIN) 5-325 MG tablet, Take 1 tablet by mouth every 8 (eight) hours as needed for 7 days, Disp: 21 tablet, Rfl: 0   HYDROcodone -acetaminophen  (NORCO/VICODIN) 5-325 MG tablet, Take 1 tablet by mouth daily as needed for severe  pain., Disp: 30 tablet, Rfl: 0   Semaglutide -Weight Management (WEGOVY ) 1.7 MG/0.75ML SOAJ, Inject 1.7 mg into the skin once a week., Disp: 3 mL, Rfl: 0   Objective:   Vitals:   05/15/24 1300  BP: 130/76  Pulse: 99  SpO2: 97%    Physical Exam Vitals reviewed.  Constitutional:      Appearance: Normal appearance.  HENT:     Head: Atraumatic.  Cardiovascular:     Rate and Rhythm: Normal rate.     Pulses: Normal pulses.  Pulmonary:     Effort: Pulmonary effort is normal.  Abdominal:     Palpations: Abdomen is soft.  Musculoskeletal:  General: No swelling.  Skin:    General: Skin is warm.     Capillary Refill: Capillary refill takes less than 2 seconds.     Coloration: Skin is not jaundiced.     Findings: No lesion.  Neurological:     Mental Status: She is alert and oriented to person, place, and time.  Psychiatric:        Mood and Affect: Mood normal.        Behavior: Behavior normal.        Thought Content: Thought content normal.        Judgment: Judgment normal.     Assessment & Plan:  Adjustment disorder with mixed anxiety and depressed mood - Plan: Ambulatory Referral For Surgery Scheduling  Bipolar 2 disorder, major depressive episode (HCC)  Incomplete tear of right rotator cuff, unspecified whether traumatic - Plan: Ambulatory Referral For Surgery Scheduling  Neuropathy - Plan: Ambulatory Referral For Surgery Scheduling  Chronic pain of left knee - Plan: Ambulatory Referral For Surgery Scheduling  Obesity (BMI 30.0-34.9) - Plan: Ambulatory Referral For Surgery Scheduling  Symptomatic mammary hypertrophy - Plan: Ambulatory Referral For Surgery Scheduling  Chronic bilateral thoracic back pain  The procedure the patient selected and that was best for the patient was discussed. The risk were discussed and include but not limited to the following:  Breast asymmetry, fluid accumulation, firmness of the breast, inability to breast feed, loss of nipple or  areola, skin loss, change in skin and nipple sensation, fat necrosis of the breast tissue, bleeding, infection and healing delay.  There are risks of anesthesia and injury to nerves or blood vessels.  Allergic reaction to tape, suture and skin glue are possible.  There will be swelling.  Any of these can lead to the need for revisional surgery which is not included in this surgery.  A breast reduction has potential to interfere with diagnostic procedures in the future.  This procedure is best done when the breast is fully developed.  Changes in the breast will continue to occur over time: pregnancy, weight gain or weigh loss. No guarantees are given for a certain bra or breast size.    Total time: 40 minutes. This includes time spent with the patient during the visit as well as time spent before and after the visit reviewing the chart, documenting the encounter, ordering pertinent studies and literature for the patient.   Physical therapy:  done Mammogram:  done  The patient is a good candidate for bilateral breast reduction with liposuction.  Will go ahead and get this submitted the risks and complications were reviewed in detail and include inability to breast-feed, change in nipple areola sensation and change in bra size.  Pictures were obtained of the patient and placed in the chart with the patient's or guardian's permission.   Theresa RAMAN Gemini Bunte, DO

## 2024-05-16 ENCOUNTER — Ambulatory Visit: Payer: MEDICAID | Admitting: Physical Therapy

## 2024-05-16 ENCOUNTER — Other Ambulatory Visit: Payer: Self-pay | Admitting: Physician Assistant

## 2024-05-16 DIAGNOSIS — I10 Essential (primary) hypertension: Secondary | ICD-10-CM

## 2024-05-17 ENCOUNTER — Other Ambulatory Visit (HOSPITAL_COMMUNITY): Payer: Self-pay

## 2024-05-18 ENCOUNTER — Encounter: Payer: MEDICAID | Admitting: Physical Therapy

## 2024-05-23 ENCOUNTER — Encounter: Payer: Self-pay | Admitting: Physical Therapy

## 2024-05-23 ENCOUNTER — Ambulatory Visit: Payer: MEDICAID | Admitting: Physical Therapy

## 2024-05-23 ENCOUNTER — Other Ambulatory Visit: Payer: Self-pay

## 2024-05-23 DIAGNOSIS — M542 Cervicalgia: Secondary | ICD-10-CM | POA: Diagnosis not present

## 2024-05-23 DIAGNOSIS — M6281 Muscle weakness (generalized): Secondary | ICD-10-CM

## 2024-05-23 DIAGNOSIS — G8929 Other chronic pain: Secondary | ICD-10-CM

## 2024-05-23 NOTE — Therapy (Signed)
 OUTPATIENT PHYSICAL THERAPY DAILY NOTE   Patient Name: Theresa Cantrell MRN: [id number] DOB:06-Nov-1975, 48 y.o., female Today's Date: 05/23/2024   PT End of Session - 05/23/24 1147     Visit Number 5    Number of Visits --   1-2x/week   Date for PT Re-Evaluation 06/06/24    Authorization Type [employer]    Authorization Time Period 04/11/24-05/27/24    Authorization - Number of Visits 12    PT Start Time 1147    PT Stop Time 1227    PT Time Calculation (min) 40 min          Past Medical History:  Diagnosis Date   Anxiety    Arthritis    Bipolar 1 disorder (HCC)    Depression    GERD (gastroesophageal reflux disease)    Hypertension    PTSD (post-traumatic stress disorder)    Past Surgical History:  Procedure Laterality Date   ABDOMINAL HYSTERECTOMY     ANTERIOR CERVICAL DECOMP/DISCECTOMY FUSION N/A 06/06/2021   Procedure: C4-5, C5-6 ANTERIOR CERVICAL DISCECTOMY FUSION, ALLOGRAFT, PLATE;  Surgeon: Barbarann Oneil BROCKS, MD;  Location: [facility] OR;  Service: Orthopedics;  Laterality: N/A;   CHOLECYSTECTOMY     DILATION AND CURETTAGE OF UTERUS     FOOT SURGERY Bilateral    two pins in each foot   JOINT REPLACEMENT Left 2018   partial knee replacement   KNEE SURGERY Left 2018   partial knee replacement   NECK SURGERY  06/06/2021   C4-5, C5-6 ANTERIOR CERVICAL DISCECTOMY FUSION, ALLOGRAFT, PLATE   Patient Active Problem List   Diagnosis Date Noted   Symptomatic mammary hypertrophy 05/15/2024   Back pain 05/15/2024   Gastroesophageal reflux disease with esophagitis without hemorrhage 01/17/2024   Cervical strain, acute, sequela 12/21/2023   Protrusion of lumbar intervertebral disc 03/24/2023   Depression with anxiety 11/30/2022   Partial thickness rotator cuff tear 11/16/2022   Adjustment disorder with mixed anxiety and depressed mood 02/11/2022   Rotator cuff tendinitis, right 11/14/2021   S/P cervical spinal fusion 06/17/2021   COVID-19 vaccine series not completed 12/20/2020    Influenza vaccine refused 11/07/2020   Essential hypertension 11/07/2020   Obesity (BMI 30.0-34.9) 11/07/2020   Tobacco dependence 11/07/2020   Gastroesophageal reflux disease without esophagitis 11/07/2020   History of abnormal cervical Pap smear 11/07/2020   History of bipolar disorder 11/07/2020   Bipolar 2 disorder, major depressive episode (HCC) 06/12/2020   PTSD (post-traumatic stress disorder) 06/12/2020   Chronic pain of left knee 10/05/2019   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   Neuropathy 02/07/2014   Hot flashes 02/07/2014   Weight loss 02/07/2014   Anxiety     PCP: Vicci Barnie NOVAK, MD  REFERRING PROVIDER: Georgina Ozell LABOR, MD  THERAPY DIAG:  Cervicalgia  Muscle weakness (generalized)  Chronic left shoulder pain  REFERRING DIAG: Radiculopathy, cervical region [M54.12]   Rationale for Evaluation and Treatment:  Rehabilitation  SUBJECTIVE:  PERTINENT PAST HISTORY:  Neuropathy, bipolar, PTSD, cervical spinal fusion C4-C6, depression and anxiety, ptsd, L knee pain with multiple surgeries      PRECAUTIONS: None  WEIGHT BEARING RESTRICTIONS No  FALLS:  Has patient fallen in last 6 months? No, Number of falls: 0  MOI/History of condition:  Onset date: December of 2025  SUBJECTIVE STATEMENT  05/23/2024:  Pt reports that overall she has seen some improvement.  She has had a few instances of a more sharp pain on the R side of her neck  since last visit.  EVAL: Theresa Cantrell is a 48 y.o. female who presents to clinic with chief complaint of neck pain with bil radicular sxs L>R.  She feels weak at times in the L arm.  Denies any clumsiness or frequently dropping objects.  She has intermittent n/t in bil hands L>R.  Denies any significant change in gait or balance.   Red flags:  denies   Pain:  Are you having pain? Yes Pain location: neck L>R arm NPRS scale:  Average: 0/10, Worst: 8/10 Aggravating factors: neck movements (L  rotation and s/b) Relieving factors: rest Pain description: intermittent  Occupation: NA  Assistive Device: SPC  Hand Dominance: R  Patient Goals/Specific Activities: reduce pain   OBJECTIVE:   DIAGNOSTIC FINDINGS:  XRs of the cervical spine from 03/24/2024 were independently reviewed and  interpreted, showing anterior cervical instrumentation from C4-C6.  There  appears to be fusion mass spanning the former disc spaces.  No lucency  seen around the screws.  None screws backed out.  Small anterior  osteophyte formation off the inferior aspect of the C3 vertebra.  Disc  height loss at C6/7.  No evidence of instability on flexion/extension  views.  No fracture or dislocation seen.   GENERAL OBSERVATION: Antalgic gait using SPC     SENSATION: Light touch: Appears intact on exam subjective reports of n/t in L>R hand   PALPATION: TTP bil sub occipitals, exquisite TTP L UT  Cervical ROM  ROM ROM  (Eval)  Flexion 30  Extension 30  Right lateral flexion 30  Left lateral flexion 25*  Right rotation 45  Left rotation 40*  Flexion rotation (normal is 30 degrees)   Flexion rotation (normal is 30 degrees)     (Blank rows = not tested, N = WNL, * = concordant pain)  UPPER EXTREMITY MMT:  MMT Right (Eval) Left (Eval)  Shoulder flexion 4 4  Shoulder abduction (C5) c c  Shoulder ER    Shoulder IR    Middle trapezius    Lower trapezius    Shoulder extension    Grip strength 36 lbs 15 lbs  Shoulder shrug (C4) c c  Elbow flexion (C6) c c  Elbow ext (C7) c c  Thumb ext (C8) c c  Finger abd (T1) c c  DNF endurance 9''    (Blank rows = not tested, score listed is out of 5 possible points.  N = WNL, D = diminished, C = clear for gross weakness with myotome testing, * = concordant pain with testing)   SPECIAL TESTS:  Spurling's: + L  Hoffman's:  (-)   JOINT MOBILITY TESTING:  Hypomobile throughout Cx spine  PATIENT SURVEYS:  NDI: 30/50    TODAY'S TREATMENT:   [facility] Adult PT Treatment:                                                DATE: 05/23/24 Therapeutic Exercise: UBE 4'/2' fwd and backward - some increase in n/t sxs in hands Standing shoulder rolls 2 x 10 Standing W back at wall 2 x 10 T Seated bil ER - 2x15 Self L s/b iso - 5'' x5  Cervical isometrics - 5'' x5 - PT pressure  Flexion - 4 lbs - 5'' x5 Ext - 8 lbs R SB - 5 lbs L SB - 5 lbs - most  challenging  Horizontal abd at wall- 2x8 - RTB DNF lift 10 sec x 5 Education: theracane  Manual Therapy: STW bilateral upper traps and L scalenes   [facility] Adult PT Treatment  04/25/2024:  Therapeutic Exercise: UBE 3'/1' fwd and backward - some increase in n/t sxs in hands Supine horizontal abd - 2x15 - GTB Alternating shoulder flexion in comfortable range Shoulder ER/IR in supine - 2# weight - 2x10 ea DNF endurance - chin tuck with lift - 5'' x5 Cat camel - not tolerated d/t low back pain Supine non painful arc rotation Light resisted rotation in non painful arc  Manual Therapy  STM bil UT Sub occipital release       HOME EXERCISE PROGRAM: Access Code: [email or web address] URL: [email or web address] Date: 04/11/2024 Prepared by: Theresa Cantrell  Exercises - Supine Cervical Retraction with Towel  - 2 x daily - 7 x weekly - 2 sets - 10 reps - 5 second hold - Standing Shoulder Horizontal Abduction with Resistance  - 1 x daily - 7 x weekly - 3 sets - 10 reps - Seated Shoulder Rolls  - 1 x daily - 7 x weekly - 3 sets - 10 reps  Treatment priorities   Eval 6/24 7/22      L UT manual/TDN '' theracane      posture '' Cervical isometrics      DNF endurance ''        SAPS L shoulder?                 ASSESSMENT:  CLINICAL IMPRESSION:  Theresa Cantrell tolerated session well with no adverse reaction.  Pt reports overall improvement with a few instances of more severe L sided neck pain since last visit.  Concentrated on cervical and periscapular strenthening to day.  Added in  cervical isometrics which were tolerated well but did show significant difficulty with L s/b compared to R and reported feeling of weakness today.   EVAL: Theresa Cantrell is a 48 y.o. female who presents to clinic with signs and sxs consistent with neck pain with intermittent radicular sxs.  No signs of gross neurological weakness on exam but pt does have significantly reduced grip strength L vs R.  Consistent with central vs bil foraminal stenosis.  Sxs correspond to x-ray showing height loss at C6-C7.  Theresa Cantrell will benefit form skilled therapy to address relevant deficits and improve comfort with daily tasks such as driving and lifting.  OBJECTIVE IMPAIRMENTS: Pain, cervical ROM, UE strength  ACTIVITY LIMITATIONS: lifting, head turns while driving, reading  PERSONAL FACTORS: See medical history and pertinent history   REHAB POTENTIAL: Good  CLINICAL DECISION MAKING: Evolving/moderate complexity  EVALUATION COMPLEXITY: Moderate   GOALS:   SHORT TERM GOALS: Target date: 05/10/2024   Theresa Cantrell will be >75% HEP compliant to improve carryover between sessions and facilitate independent management of condition  Evaluation: ongoing Goal status: MET   LONG TERM GOALS: Target date: 06/07/2024   Theresa Cantrell will self report >/= 50% decrease in pain from evaluation to improve function in daily tasks  Evaluation/Baseline: 8/10 max pain Goal status: INITIAL   2.  Theresa Cantrell will show a >/= 10 pt improvement in their NDI score (MCID ~20% or 10/50 pts) as a proxy for functional improvement  Evaluation/Baseline: 30/50 pts Goal status: INITIAL   3.  Theresa Cantrell will show significant improvement in L grip strength  Evaluation/Baseline: 15 lbs Goal status: INITIAL   4.  Theresa Cantrell will report <1 instance of N/T in UE at night  Evaluation/Baseline:  limited Goal status: INITIAL   PLAN: PT FREQUENCY: 1-2x/week  PT DURATION: 8 weeks  PLANNED INTERVENTIONS:  97164- PT Re-evaluation, 97110-Therapeutic exercises,  97530- Therapeutic activity, [id number]- Neuromuscular re-education, 97535- Self Care, 02859- Manual therapy, [id number]- Gait training, [id number]- Aquatic Therapy, [phone]- Electrical stimulation (manual), [id number]- Vasopneumatic device, [id number]- Traction (mechanical), [id number]- Ionotophoresis 4mg /ml Dexamethasone , Taping, Dry Needling, Joint manipulation, and Spinal manipulation.   Theresa BRAVO Jaxon Flatt PT 05/23/24 12:41 PM Phone: [phone] Fax: [phone]   I just finished a MCD eval/recert.  Name: Theresa Cantrell  MRN: [id number] Please request 1x/week for 8 weeks.  Check all conditions that are expected to impact treatment: Musculoskeletal disorders   I DID put a charge in.  Check all possible CPT codes: 02889- Therapeutic Exercise, [phone]- Neuro Re-education, [phone] - Gait Training, [phone] - Manual Therapy, 97530 - Therapeutic Activities, 97535 - Self Care, [phone] - Re-evaluation, [id number] - Mechanical traction, and [id number] - Aquatic therapy   Thank you!  MCD - Secure

## 2024-05-25 ENCOUNTER — Encounter: Payer: Self-pay | Admitting: Physical Therapy

## 2024-05-25 ENCOUNTER — Ambulatory Visit: Payer: MEDICAID | Admitting: Physical Therapy

## 2024-05-25 DIAGNOSIS — M542 Cervicalgia: Secondary | ICD-10-CM

## 2024-05-25 DIAGNOSIS — M6281 Muscle weakness (generalized): Secondary | ICD-10-CM

## 2024-05-25 DIAGNOSIS — G8929 Other chronic pain: Secondary | ICD-10-CM

## 2024-05-25 NOTE — Therapy (Signed)
 OUTPATIENT PHYSICAL THERAPY DAILY NOTE   Patient Name: Theresa Cantrell MRN: [id number] DOB:1976-03-22, 48 y.o., female Today's Date: 05/25/2024   PT End of Session - 05/25/24 0849     Visit Number 6    Number of Visits --   1-2x/week   Date for PT Re-Evaluation 06/06/24    Authorization Type [employer]    Authorization Time Period 04/11/24-05/27/24    Authorization - Visit Number 6    Authorization - Number of Visits 12    PT Start Time 0847    PT Stop Time 0927    PT Time Calculation (min) 40 min          Past Medical History:  Diagnosis Date   Anxiety    Arthritis    Bipolar 1 disorder (HCC)    Depression    GERD (gastroesophageal reflux disease)    Hypertension    PTSD (post-traumatic stress disorder)    Past Surgical History:  Procedure Laterality Date   ABDOMINAL HYSTERECTOMY     ANTERIOR CERVICAL DECOMP/DISCECTOMY FUSION N/A 06/06/2021   Procedure: C4-5, C5-6 ANTERIOR CERVICAL DISCECTOMY FUSION, ALLOGRAFT, PLATE;  Surgeon: Barbarann Oneil BROCKS, MD;  Location: [facility] OR;  Service: Orthopedics;  Laterality: N/A;   CHOLECYSTECTOMY     DILATION AND CURETTAGE OF UTERUS     FOOT SURGERY Bilateral    two pins in each foot   JOINT REPLACEMENT Left 2018   partial knee replacement   KNEE SURGERY Left 2018   partial knee replacement   NECK SURGERY  06/06/2021   C4-5, C5-6 ANTERIOR CERVICAL DISCECTOMY FUSION, ALLOGRAFT, PLATE   Patient Active Problem List   Diagnosis Date Noted   Symptomatic mammary hypertrophy 05/15/2024   Back pain 05/15/2024   Gastroesophageal reflux disease with esophagitis without hemorrhage 01/17/2024   Cervical strain, acute, sequela 12/21/2023   Protrusion of lumbar intervertebral disc 03/24/2023   Depression with anxiety 11/30/2022   Partial thickness rotator cuff tear 11/16/2022   Adjustment disorder with mixed anxiety and depressed mood 02/11/2022   Rotator cuff tendinitis, right 11/14/2021   S/P cervical spinal fusion 06/17/2021   COVID-19 vaccine  series not completed 12/20/2020   Influenza vaccine refused 11/07/2020   Essential hypertension 11/07/2020   Obesity (BMI 30.0-34.9) 11/07/2020   Tobacco dependence 11/07/2020   Gastroesophageal reflux disease without esophagitis 11/07/2020   History of abnormal cervical Pap smear 11/07/2020   History of bipolar disorder 11/07/2020   Bipolar 2 disorder, major depressive episode (HCC) 06/12/2020   PTSD (post-traumatic stress disorder) 06/12/2020   Chronic pain of left knee 10/05/2019   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   Neuropathy 02/07/2014   Hot flashes 02/07/2014   Weight loss 02/07/2014   Anxiety     PCP: Vicci Barnie NOVAK, MD  REFERRING PROVIDER: Vicci Barnie NOVAK, MD  THERAPY DIAG:  Cervicalgia  Muscle weakness (generalized)  Chronic left shoulder pain  REFERRING DIAG: Radiculopathy, cervical region [M54.12]   Rationale for Evaluation and Treatment:  Rehabilitation  SUBJECTIVE:  PERTINENT PAST HISTORY:  Neuropathy, bipolar, PTSD, cervical spinal fusion C4-C6, depression and anxiety, ptsd, L knee pain with multiple surgeries      PRECAUTIONS: None  WEIGHT BEARING RESTRICTIONS No  FALLS:  Has patient fallen in last 6 months? No, Number of falls: 0  MOI/History of condition:  Onset date: December of 2025  SUBJECTIVE STATEMENT  05/25/2024:  Pt reports that she felt good after last visit.  She has increased pain this morning for no clear She  rates her current pain 8/10 as well as her low back.  EVAL: Theresa Cantrell is a 48 y.o. female who presents to clinic with chief complaint of neck pain with bil radicular sxs L>R.  She feels weak at times in the L arm.  Denies any clumsiness or frequently dropping objects.  She has intermittent n/t in bil hands L>R.  Denies any significant change in gait or balance.   Red flags:  denies   Pain:  Are you having pain? Yes Pain location: neck L>R arm NPRS scale:  Average: 0/10, Worst:  8/10 Aggravating factors: neck movements (L rotation and s/b) Relieving factors: rest Pain description: intermittent  Occupation: NA  Assistive Device: SPC  Hand Dominance: R  Patient Goals/Specific Activities: reduce pain   OBJECTIVE:   DIAGNOSTIC FINDINGS:  XRs of the cervical spine from 03/24/2024 were independently reviewed and  interpreted, showing anterior cervical instrumentation from C4-C6.  There  appears to be fusion mass spanning the former disc spaces.  No lucency  seen around the screws.  None screws backed out.  Small anterior  osteophyte formation off the inferior aspect of the C3 vertebra.  Disc  height loss at C6/7.  No evidence of instability on flexion/extension  views.  No fracture or dislocation seen.   GENERAL OBSERVATION: Antalgic gait using SPC     SENSATION: Light touch: Appears intact on exam subjective reports of n/t in L>R hand   PALPATION: TTP bil sub occipitals, exquisite TTP L UT  Cervical ROM  ROM ROM  (Eval)  Flexion 30  Extension 30  Right lateral flexion 30  Left lateral flexion 25*  Right rotation 45  Left rotation 40*  Flexion rotation (normal is 30 degrees)   Flexion rotation (normal is 30 degrees)     (Blank rows = not tested, N = WNL, * = concordant pain)  UPPER EXTREMITY MMT:  MMT Right (Eval) Left (Eval)  Shoulder flexion 4 4  Shoulder abduction (C5) c c  Shoulder ER    Shoulder IR    Middle trapezius    Lower trapezius    Shoulder extension    Grip strength 36 lbs 15 lbs  Shoulder shrug (C4) c c  Elbow flexion (C6) c c  Elbow ext (C7) c c  Thumb ext (C8) c c  Finger abd (T1) c c  DNF endurance 9''    (Blank rows = not tested, score listed is out of 5 possible points.  N = WNL, D = diminished, C = clear for gross weakness with myotome testing, * = concordant pain with testing)   SPECIAL TESTS:  Spurling's: + L  Hoffman's:  (-)   JOINT MOBILITY TESTING:  Hypomobile throughout Cx spine  PATIENT  SURVEYS:  NDI: 30/50    TODAY'S TREATMENT:  [facility] Adult PT Treatment:                                                DATE: 05/25/2024 Therapeutic Exercise: nu-step L5 20m while taking subjective and planning session with patient Corner pec stretch - 45'' x3 Ball roll up wall for thoracic ext - x10 Shoulder rolls - 6# - 2x10 Standing W back at wall 2 x 10 - RTB - correction for shoulder hike DNF lift 20 sec x 3  Manual Therapy: STW L UT and scalenes  HOME EXERCISE PROGRAM: Access Code: [email or web address] URL: [email or web address] Date: 04/11/2024 Prepared by: Helene Gasmen  Exercises - Supine Cervical Retraction with Towel  - 2 x daily - 7 x weekly - 2 sets - 10 reps - 5 second hold - Standing Shoulder Horizontal Abduction with Resistance  - 1 x daily - 7 x weekly - 3 sets - 10 reps - Seated Shoulder Rolls  - 1 x daily - 7 x weekly - 3 sets - 10 reps  Treatment priorities   Eval 6/24 7/22      L UT manual/TDN '' theracane      posture '' Cervical isometrics      DNF endurance ''        SAPS L shoulder?                 ASSESSMENT:  CLINICAL IMPRESSION:  Anielle tolerated session well with no adverse reaction.  Pt reports flare of R sided UT pain today but reports significant reduction in pain following manual.  Concentrated on gentle mobility combined with loading of upper and mid trap to good effect.  Will progress as able.  EVAL: Celines is a 48 y.o. female who presents to clinic with signs and sxs consistent with neck pain with intermittent radicular sxs.  No signs of gross neurological weakness on exam but pt does have significantly reduced grip strength L vs R.  Consistent with central vs bil foraminal stenosis.  Sxs correspond to x-ray showing height loss at C6-C7.  Kaydince will benefit form skilled therapy to address relevant deficits and improve comfort with daily tasks such as driving and lifting.  OBJECTIVE IMPAIRMENTS: Pain, cervical ROM, UE  strength  ACTIVITY LIMITATIONS: lifting, head turns while driving, reading  PERSONAL FACTORS: See medical history and pertinent history   REHAB POTENTIAL: Good  CLINICAL DECISION MAKING: Evolving/moderate complexity  EVALUATION COMPLEXITY: Moderate   GOALS:   SHORT TERM GOALS: Target date: 05/10/2024   Velera will be >75% HEP compliant to improve carryover between sessions and facilitate independent management of condition  Evaluation: ongoing Goal status: MET   LONG TERM GOALS: Target date: 06/07/2024   Kendre will self report >/= 50% decrease in pain from evaluation to improve function in daily tasks  Evaluation/Baseline: 8/10 max pain Goal status: INITIAL   2.  Phebe will show a >/= 10 pt improvement in their NDI score (MCID ~20% or 10/50 pts) as a proxy for functional improvement  Evaluation/Baseline: 30/50 pts Goal status: INITIAL   3.  Teckla will show significant improvement in L grip strength  Evaluation/Baseline: 15 lbs Goal status: INITIAL   4.  Innocence will report <1 instance of N/T in UE at night  Evaluation/Baseline: limited Goal status: INITIAL   PLAN: PT FREQUENCY: 1-2x/week  PT DURATION: 8 weeks  PLANNED INTERVENTIONS:  97164- PT Re-evaluation, 97110-Therapeutic exercises, 97530- Therapeutic activity, [id number]- Neuromuscular re-education, 97535- Self Care, 02859- Manual therapy, [id number]- Gait training, [id number]- Aquatic Therapy, [phone]- Electrical stimulation (manual), [id number]- Vasopneumatic device, [id number]- Traction (mechanical), [id number]- Ionotophoresis 4mg /ml Dexamethasone , Taping, Dry Needling, Joint manipulation, and Spinal manipulation.   Helene BRAVO Zoe Goonan PT 05/25/24 9:29 AM Phone: [phone] Fax: [phone]   I just finished a MCD eval/recert.  Name: PENI RUPARD  MRN: [id number] Please request 1x/week for 8 weeks.  Check all conditions that are expected to impact treatment: Musculoskeletal disorders   I DID put a charge in.  Check  all possible CPT codes: 02889- Therapeutic Exercise, [phone]- Neuro Re-education, [phone] - Gait Training, [phone] -  Manual Therapy, 97530 - Therapeutic Activities, 97535 - Self Care, 02835 - Re-evaluation, [id number] - Mechanical traction, and [id number] - Aquatic therapy   Thank you!  MCD - Secure

## 2024-05-30 ENCOUNTER — Ambulatory Visit: Payer: MEDICAID | Admitting: Physical Therapy

## 2024-05-30 ENCOUNTER — Encounter: Payer: Self-pay | Admitting: Physical Therapy

## 2024-05-30 ENCOUNTER — Telehealth (HOSPITAL_COMMUNITY): Payer: MEDICAID | Admitting: Physician Assistant

## 2024-05-30 ENCOUNTER — Encounter (HOSPITAL_COMMUNITY): Payer: Self-pay | Admitting: Physician Assistant

## 2024-05-30 DIAGNOSIS — G8929 Other chronic pain: Secondary | ICD-10-CM

## 2024-05-30 DIAGNOSIS — F3181 Bipolar II disorder: Secondary | ICD-10-CM

## 2024-05-30 DIAGNOSIS — M6281 Muscle weakness (generalized): Secondary | ICD-10-CM

## 2024-05-30 DIAGNOSIS — M542 Cervicalgia: Secondary | ICD-10-CM

## 2024-05-30 DIAGNOSIS — F4310 Post-traumatic stress disorder, unspecified: Secondary | ICD-10-CM

## 2024-05-30 DIAGNOSIS — F419 Anxiety disorder, unspecified: Secondary | ICD-10-CM | POA: Diagnosis not present

## 2024-05-30 MED ORDER — HYDROXYZINE HCL 10 MG PO TABS: 10.0000 mg | ORAL_TABLET | Freq: Three times a day (TID) | ORAL | 1 refills | Status: DC | PRN

## 2024-05-30 MED ORDER — ARIPIPRAZOLE 15 MG PO TABS: 15.0000 mg | ORAL_TABLET | Freq: Every day | ORAL | 2 refills | Status: DC

## 2024-05-30 MED ORDER — PRAZOSIN HCL 2 MG PO CAPS: 2.0000 mg | ORAL_CAPSULE | Freq: Every day | ORAL | 2 refills | Status: DC

## 2024-05-30 MED ORDER — TRAZODONE HCL 150 MG PO TABS: 150.0000 mg | ORAL_TABLET | Freq: Every evening | ORAL | 1 refills | Status: DC | PRN

## 2024-05-30 MED ORDER — FLUOXETINE HCL 20 MG PO CAPS: 20.0000 mg | ORAL_CAPSULE | Freq: Every day | ORAL | 2 refills | Status: DC

## 2024-05-30 NOTE — Progress Notes (Signed)
 [facility] MD/PA/NP OP Progress Note  Virtual Visit via Video Note  I connected with Theresa Cantrell on 05/30/24 at  3:30 PM EDT by a video enabled telemedicine application and verified that I am speaking with the correct person using two identifiers.  Location: Patient: Home Provider: Clinic   I discussed the limitations of evaluation and management by telemedicine and the availability of in person appointments. The patient expressed understanding and agreed to proceed.  Follow Up Instructions:  I discussed the assessment and treatment plan with the patient. The patient was provided an opportunity to ask questions and all were answered. The patient agreed with the plan and demonstrated an understanding of the instructions.   The patient was advised to call back or seek an in-person evaluation if the symptoms worsen or if the condition fails to improve as anticipated.  I provided 13 minutes of non-face-to-face time during this encounter.  Theresa FORBES Bolster, PA    05/30/2024 3:30 PM Theresa Cantrell  MRN:  [id number]  Chief Complaint:  Chief Complaint  Patient presents with   Follow-up   Medication Refill   HPI:   Theresa Cantrell is a 48 year old female with a past psychiatric history significant for bipolar 2 disorder (major depressive episode), PTSD, and anxiety who presents to [facility] via virtual video visit for follow-up and medication management.  Patient is currently being managed on the medications:  Trazodone  150 mg at bedtime Abilify  15 mg daily Fluoxetine  20 mg daily Hydroxyzine  10 mg 3 times daily as needed Prazosin  2 mg at bedtime  Patient presents to the encounter stating that she continues to take her medications regularly.  She reports that her sleep quality has been okay as of late.  She reports that her sleep quality is affected by the amount of pain that she is in (back pain) or from her use of her fluid pills.  Patient  reports that she still continues to experience some depression.  She reports that she experiences low points on occasion and attributes those low points to the level of pain that she is in.  Patient rates her depression a 6 out of 10 with 10 being most severe.  Patient endorses depressive episodes 3 days out of the week.  Patient endorses the following depressive symptoms: feelings of sadness, lack of motivation, decreased concentration, decreased energy, irritability, crying spells, and feelings of guilt/worthlessness (improving).  Patient denies hopelessness.  Patient endorses anxiety and rates her anxiety as 4 out of 10.  Patient's main stressor revolves around financial instability.  Patient is alert and oriented x 4, calm, cooperative, and fully engaged in conversation during the encounter.  Patient endorses pretty good mood.  Patient exhibits depressed mood with appropriate affect.  Patient denies suicidal or homicidal ideations.  She further denies auditory or visual hallucinations and does not appear to be responding to internal/external stimuli.  Patient endorses fair sleep and receives on average 5 to 8 hours of sleep per night.  Patient endorses fair appetite and eats on average 2 meals per day.  Patient denies alcohol consumption, tobacco use, or illicit drug use.  Visit Diagnosis:    ICD-10-CM   1. Anxiety  F41.9 FLUoxetine  (PROZAC ) 20 MG capsule    hydrOXYzine  (ATARAX ) 10 MG tablet    2. Bipolar 2 disorder, major depressive episode (HCC)  F31.81 FLUoxetine  (PROZAC ) 20 MG capsule    traZODone  (DESYREL ) 150 MG tablet    ARIPiprazole  (ABILIFY ) 15 MG tablet  3. PTSD (post-traumatic stress disorder)  F43.10 FLUoxetine  (PROZAC ) 20 MG capsule    prazosin  (MINIPRESS ) 2 MG capsule     Past Psychiatric History:  Disorder (major depressive episode) Anxiety PTSD  Past Medical History:  Past Medical History:  Diagnosis Date   Anxiety    Arthritis    Bipolar 1 disorder (HCC)     Depression    GERD (gastroesophageal reflux disease)    Hypertension    PTSD (post-traumatic stress disorder)     Past Surgical History:  Procedure Laterality Date   ABDOMINAL HYSTERECTOMY     ANTERIOR CERVICAL DECOMP/DISCECTOMY FUSION N/A 06/06/2021   Procedure: C4-5, C5-6 ANTERIOR CERVICAL DISCECTOMY FUSION, ALLOGRAFT, PLATE;  Surgeon: Barbarann Oneil BROCKS, MD;  Location: [facility] OR;  Service: Orthopedics;  Laterality: N/A;   CHOLECYSTECTOMY     DILATION AND CURETTAGE OF UTERUS     FOOT SURGERY Bilateral    two pins in each foot   JOINT REPLACEMENT Left 2018   partial knee replacement   KNEE SURGERY Left 2018   partial knee replacement   NECK SURGERY  06/06/2021   C4-5, C5-6 ANTERIOR CERVICAL DISCECTOMY FUSION, ALLOGRAFT, PLATE    Family Psychiatric History:  Father - bipolar disorder, schizophrenia. Patient reports that her father was on medications but does not remember what medications he was on Mother - depression, anxiety.  Patient reports that her mother was on medications but does not remember what medications she was on   Family history of suicide: Patient reports that her father attempted multiple times in the past Family history of homicide: Patient denies Family history of substance abuse: Patient reports that her father abused crack cocaine, marijuana, and alcohol  Family History:  Family History  Problem Relation Age of Onset   Hypertension Mother    Cancer Mother        breast   Anxiety disorder Mother    Hypertension Father    Schizophrenia Father    Bipolar disorder Father    Anxiety disorder Father    Parkinson's disease Father    Colon cancer Neg Hx    Colon polyps Neg Hx    Esophageal cancer Neg Hx    Rectal cancer Neg Hx    Stomach cancer Neg Hx     Social History:  Social History   Socioeconomic History   Marital status: Single    Spouse name: Not on file   Number of children: 1   Years of education: Not on file   Highest education level: Some  college, no degree  Occupational History   Not on file  Tobacco Use   Smoking status: Former    Current packs/day: 0.25    Types: Cigarettes   Smokeless tobacco: Never  Vaping Use   Vaping status: Never Used  Substance and Sexual Activity   Alcohol use: Yes    Alcohol/week: 2.0 standard drinks of alcohol    Types: 1 Glasses of wine, 1 Cans of beer per week   Drug use: No   Sexual activity: Not Currently    Birth control/protection: Surgical  Other Topics Concern   Not on file  Social History Narrative   Not on file   Social Drivers of Health   Financial Resource Strain: High Risk (04/20/2024)   Overall Financial Resource Strain (CARDIA)    Difficulty of Paying Living Expenses: Very hard  Food Insecurity: Food Insecurity Present (04/20/2024)   Hunger Vital Sign    Worried About Running Out of Food in the Last  Year: Sometimes true    Ran Out of Food in the Last Year: Often true  Transportation Needs: No Transportation Needs (04/20/2024)   PRAPARE - Administrator, Civil Service (Medical): No    Lack of Transportation (Non-Medical): No  Physical Activity: Insufficiently Active (04/20/2024)   Exercise Vital Sign    Days of Exercise per Week: 3 days    Minutes of Exercise per Session: 10 min  Stress: Stress Concern Present (04/20/2024)   [employer] of Occupational Health - Occupational Stress Questionnaire    Feeling of Stress: Very much  Social Connections: Socially Isolated (04/20/2024)   Social Connection and Isolation Panel    Frequency of Communication with Friends and Family: More than three times a week    Frequency of Social Gatherings with Friends and Family: Three times a week    Attends Religious Services: Never    Active Member of Clubs or Organizations: No    Attends Banker Meetings: Not on file    Marital Status: Never married    Allergies:  Allergies  Allergen Reactions   Amlodipine  Swelling    Lower extremity edema.    Hydrochlorothiazide  Other (See Comments)    Cough   Lamotrigine Hives   Chlorthalidone  Rash and Other (See Comments)    Cramping    Metabolic Disorder Labs: Lab Results  Component Value Date   HGBA1C 5.7 (H) 04/24/2024   No results found for: PROLACTIN Lab Results  Component Value Date   CHOL 187 09/17/2023   TRIG 105 09/17/2023   HDL 71 09/17/2023   CHOLHDL 2.6 09/17/2023   LDLCALC 97 09/17/2023   LDLCALC 86 11/07/2020   Lab Results  Component Value Date   TSH 0.711 11/23/2022   TSH 0.484 03/31/2018    Therapeutic Level Labs: No results found for: LITHIUM No results found for: VALPROATE No results found for: CBMZ  Current Medications: Current Outpatient Medications  Medication Sig Dispense Refill   albuterol  (VENTOLIN  HFA) 108 (90 Base) MCG/ACT inhaler Inhale 2 puffs into the lungs every 6 (six) hours as needed for wheezing or shortness of breath. (Patient not taking: Reported on 04/24/2024) 8 g 0   ARIPiprazole  (ABILIFY ) 15 MG tablet Take 1 tablet (15 mg total) by mouth daily. 30 tablet 2   carboxymethylcellulose (ARTIFICIAL TEARS) 1 % ophthalmic solution Apply 1 drop to eye 2 (two) times daily as needed. 30 mL 0   Cholecalciferol  (VITAMIN D ) 50 MCG (2000 UT) tablet Take 1 tablet (2,000 Units total) by mouth daily. 100 tablet 1   cyclobenzaprine  (FLEXERIL ) 10 MG tablet Take 0.5-1 tablets (5-10 mg total) by mouth 2 (two) times daily as needed for muscle spasms. 20 tablet 0   cyclobenzaprine  (FLEXERIL ) 10 MG tablet Take 1 tablet (10 mg total) by mouth every 12 (twelve) hours as needed for spasms 60 tablet 1   diclofenac  (VOLTAREN ) 75 MG EC tablet Take 1 tablet (75 mg total) by mouth 2 (two) times daily. 30 tablet 2   FLUoxetine  (PROZAC ) 20 MG capsule Take 1 capsule (20 mg total) by mouth daily. 30 capsule 2   furosemide  (LASIX ) 20 MG tablet TAKE 1 TABLET BY MOUTH ON MONDAY, WEDNESDAY AND FRIDAY 36 tablet 1   gabapentin (NEURONTIN) 300 MG capsule Take 300 mg by  mouth 3 (three) times daily.     HYDROcodone -acetaminophen  (NORCO/VICODIN) 5-325 MG tablet Take 1 tablet by mouth every 8 (eight) hours as needed.     HYDROcodone -acetaminophen  (NORCO/VICODIN) 5-325 MG tablet Take 1  tablet by mouth every 8 (eight) hours as needed for 7 days 21 tablet 0   HYDROcodone -acetaminophen  (NORCO/VICODIN) 5-325 MG tablet Take 1 tablet by mouth daily as needed for severe pain. 30 tablet 0   HYDROcodone -acetaminophen  (NORCO/VICODIN) 5-325 MG tablet Take 1 tablet by mouth every 8 (eight) hours as needed for pain up to 30 days. 90 tablet 0   hydrOXYzine  (ATARAX ) 10 MG tablet Take 1 tablet (10 mg total) by mouth 3 (three) times daily as needed. 75 tablet 1   meloxicam  (MOBIC ) 7.5 MG tablet Take 1 tablet (7.5 mg total) by mouth 2 (two) times daily as needed. 60 tablet 1   NIFEdipine  (PROCARDIA -XL/NIFEDICAL-XL) 30 MG 24 hr tablet Take 1 tablet by mouth daily 90 tablet 0   omeprazole  (PRILOSEC) 20 MG capsule Take 1 capsule (20 mg total) by mouth 2 (two) times daily before a meal. 60 capsule 3   prazosin  (MINIPRESS ) 2 MG capsule Take 1 capsule (2 mg total) by mouth at bedtime. 30 capsule 2   Semaglutide -Weight Management (WEGOVY ) 1.7 MG/0.75ML SOAJ Inject 1.7 mg into the skin once a week. 3 mL 0   Semaglutide -Weight Management (WEGOVY ) 2.4 MG/0.75ML SOAJ Inject 2.4 mg into the skin once a week. 9 mL 1   traZODone  (DESYREL ) 150 MG tablet Take 1 tablet (150 mg total) by mouth at bedtime as needed for sleep. 30 tablet 1   valACYclovir  (VALTREX ) 1000 MG tablet Take 1 tablet (1,000 mg total) by mouth daily. 90 tablet 1   valsartan  (DIOVAN ) 80 MG tablet Take 1 tablet (80 mg total) by mouth daily. 90 tablet 1   No current facility-administered medications for this visit.     Musculoskeletal: Strength & Muscle Tone: within normal limits Gait & Station: ataxic, patient ambulates with a cane Patient leans: N/A  Psychiatric Specialty Exam: Review of Systems  Psychiatric/Behavioral:   Positive for dysphoric mood and sleep disturbance. Negative for decreased concentration, hallucinations, self-injury and suicidal ideas. The patient is nervous/anxious. The patient is not hyperactive.     There were no vitals taken for this visit.There is no height or weight on file to calculate BMI.  General Appearance: Casual  Eye Contact:  Good  Speech:  Clear and Coherent and Normal Rate  Volume:  Normal  Mood:  Anxious and Depressed  Affect:  Appropriate  Thought Process:  Coherent, Goal Directed, and Descriptions of Associations: Intact  Orientation:  Full (Time, Place, and Person)  Thought Content: WDL   Suicidal Thoughts:  No  Homicidal Thoughts:  No  Memory:  Immediate;   Good Recent;   Good Remote;   Good  Judgement:  Good  Insight:  Good  Psychomotor Activity:  Normal  Concentration:  Concentration: Good and Attention Span: Good  Recall:  Good  Fund of Knowledge: Good  Language: Good  Akathisia:  No  Handed:  Right  AIMS (if indicated): not done  Assets:  Communication Skills Desire for Improvement Financial Resources/Insurance Housing Social Support Transportation Vocational/Educational  ADL's:  Intact  Cognition: WNL  Sleep:  Fair   Screenings: AIMS    Flowsheet Row [facility] Video Visit from 03/28/2024 in [facility]  AIMS Total Score 0 0   AUDIT    Flowsheet Row Office Visit from 05/17/2023 in [facility] [place] - A Dept Of . [facility] Most recent reading at 04/02/2023  8:48 AM Appointment from 04/06/2023 in [facility] - A Dept Of [place] Most recent reading at 04/02/2023  8:48 AM  Alcohol Use Disorder Identification Test Final Score (AUDIT) 3 3   GAD-7    Flowsheet Row Video Visit from 05/30/2024 in [facility] Office Visit from 04/24/2024 in [facility] - A Dept Of [place] Video Visit from 03/28/2024 in [facility] Office Visit from 02/24/2024 in [facility] [place] - A Dept Of [place]. [facility] Office Visit from 01/17/2024 in [facility] - A Dept Of [place] [facility]. [facility]  Total GAD-7 Score 16 20 18 14 21    PHQ2-9    Flowsheet Row Video Visit from 05/30/2024 in [facility] Office Visit from 04/24/2024 in [facility] - A Dept Of [place]. [facility] Video Visit from 03/28/2024 in [facility] Office Visit from 02/24/2024 in [facility] [place] - A Dept Of . [facility] Nutrition from 02/11/2024 in [facility]  - A Dept Of [place] [facility]. [facility]  PHQ-2 Total Score 2 6 4 5  0  PHQ-9 Total Score 15 16 9 11  --   Flowsheet Row Video Visit from 05/30/2024 in [facility] Video Visit from 03/28/2024 in [facility] Video Visit from 12/30/2023 in [facility]  C-SSRS RISK CATEGORY Moderate Risk Moderate Risk No Risk     Assessment and Plan:   Theresa Cantrell is a 48 year old female with a past psychiatric history significant for bipolar 2 disorder (major depressive episode), PTSD, and anxiety who presents to [facility] via virtual video visit for follow-up and medication management.  Patient presents to the encounter stating that she continues to take her medications regularly and denies experiencing any adverse side effects at this time.  An aims assessment was performed with the patient scoring a 0.  Patient reports that she still continues to experience depressive symptoms attributed to level of pain that she is in.  She also endorses anxiety attributed to financial stressors.  A  PHQ-9 screen was performed with the patient scoring a 15.  A GAD-7 screen was also performed with the patient scoring a 16.  Despite the patient's depression and anxiety, patient would like to continue taking her medications as prescribed.  Patient's medication to be e-prescribed to pharmacy of choice.  A [place] Suicide Severity Rating Scale was performed with the patient being considered moderate risk.  Patient denies suicidal ideations and is able to contract for safety following the conclusion of the encounter.  Safety planning was discussed with the patient prior to the conclusion of the encounter.  - Patient was instructed to call 911 in the event of a mental health crisis. - Patient was instructed to call 90 Suicide and Crisis Lifeline in the event of a mental health crisis. - Patient was instructed to present to [facility] in the event of a mental health crisis  Collaboration of Care: Collaboration of Care: Medication Management AEB provider managing patient's psychiatric medications, Primary Care Provider AEB patient being seen by internal medicine, Psychiatrist AEB patient being seen by a mental health provider at this facility, and Referral or follow-up with counselor/therapist AEB patient being seen by a licensed clinical social worker  at this facility  Patient/Guardian was advised Release of Information must be obtained prior to any record release in order to collaborate their care with an outside provider. Patient/Guardian was advised if they have not already done so to contact the registration department to sign all necessary forms in order for us  to release information regarding their care.   Consent: Patient/Guardian gives verbal consent for treatment and assignment of benefits for services provided during this visit. Patient/Guardian expressed understanding and agreed to proceed.   1. Anxiety  - FLUoxetine  (PROZAC ) 20 MG capsule; Take 1 capsule  (20 mg total) by mouth daily.  Dispense: 30 capsule; Refill: 2 - hydrOXYzine  (ATARAX ) 10 MG tablet; Take 1 tablet (10 mg total) by mouth 3 (three) times daily as needed.  Dispense: 75 tablet; Refill: 1  2. Bipolar 2 disorder, major depressive episode (HCC)  - FLUoxetine  (PROZAC ) 20 MG capsule; Take 1 capsule (20 mg total) by mouth daily.  Dispense: 30 capsule; Refill: 2 - traZODone  (DESYREL ) 150 MG tablet; Take 1 tablet (150 mg total) by mouth at bedtime as needed for sleep.  Dispense: 30 tablet; Refill: 1 - ARIPiprazole  (ABILIFY ) 15 MG tablet; Take 1 tablet (15 mg total) by mouth daily.  Dispense: 30 tablet; Refill: 2  3. PTSD (post-traumatic stress disorder)  - FLUoxetine  (PROZAC ) 20 MG capsule; Take 1 capsule (20 mg total) by mouth daily.  Dispense: 30 capsule; Refill: 2 - prazosin  (MINIPRESS ) 2 MG capsule; Take 1 capsule (2 mg total) by mouth at bedtime.  Dispense: 30 capsule; Refill: 2  Patient to follow up in 2 months Provider spent a total of 13 minutes with the patient/reviewing patient's chart  Theresa FORBES Bolster, PA 05/30/2024, 3:30 PM

## 2024-05-30 NOTE — Therapy (Signed)
 OUTPATIENT PHYSICAL THERAPY DAILY NOTE   Patient Name: Theresa Cantrell MRN: [id number] DOB:[birth year], 48 y.o., female Today's Date: 05/30/2024   PT End of Session - 05/30/24 1143     Visit Number 7    Number of Visits --   1-2x/week   Date for PT Re-Evaluation 06/06/24    Authorization Type [employer]    Authorization Time Period 04/11/24-05/27/24    Authorization - Visit Number 7    Authorization - Number of Visits 12    PT Start Time 1145    PT Stop Time 1226    PT Time Calculation (min) 41 min          Past Medical History:  Diagnosis Date   Anxiety    Arthritis    Bipolar 1 disorder (HCC)    Depression    GERD (gastroesophageal reflux disease)    Hypertension    PTSD (post-traumatic stress disorder)    Past Surgical History:  Procedure Laterality Date   ABDOMINAL HYSTERECTOMY     ANTERIOR CERVICAL DECOMP/DISCECTOMY FUSION N/A 06/06/2021   Procedure: C4-5, C5-6 ANTERIOR CERVICAL DISCECTOMY FUSION, ALLOGRAFT, PLATE;  Surgeon: Barbarann Oneil BROCKS, MD;  Location: [facility] OR;  Service: Orthopedics;  Laterality: N/A;   CHOLECYSTECTOMY     DILATION AND CURETTAGE OF UTERUS     FOOT SURGERY Bilateral    two pins in each foot   JOINT REPLACEMENT Left 2018   partial knee replacement   KNEE SURGERY Left 2018   partial knee replacement   NECK SURGERY  06/06/2021   C4-5, C5-6 ANTERIOR CERVICAL DISCECTOMY FUSION, ALLOGRAFT, PLATE   Patient Active Problem List   Diagnosis Date Noted   Symptomatic mammary hypertrophy 05/15/2024   Back pain 05/15/2024   Gastroesophageal reflux disease with esophagitis without hemorrhage 01/17/2024   Cervical strain, acute, sequela 12/21/2023   Protrusion of lumbar intervertebral disc 03/24/2023   Depression with anxiety 11/30/2022   Partial thickness rotator cuff tear 11/16/2022   Adjustment disorder with mixed anxiety and depressed mood 02/11/2022   Rotator cuff tendinitis, right 11/14/2021   S/P cervical spinal fusion 06/17/2021   COVID-19 vaccine  series not completed 12/20/2020   Influenza vaccine refused 11/07/2020   Essential hypertension 11/07/2020   Obesity (BMI 30.0-34.9) 11/07/2020   Tobacco dependence 11/07/2020   Gastroesophageal reflux disease without esophagitis 11/07/2020   History of abnormal cervical Pap smear 11/07/2020   History of bipolar disorder 11/07/2020   Bipolar 2 disorder, major depressive episode (HCC) 06/12/2020   PTSD (post-traumatic stress disorder) 06/12/2020   Chronic pain of left knee 10/05/2019   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   Neuropathy 02/07/2014   Hot flashes 02/07/2014   Weight loss 02/07/2014   Anxiety     PCP: Vicci Barnie NOVAK, MD  REFERRING PROVIDER: Georgina Ozell LABOR, MD  THERAPY DIAG:  Cervicalgia  Muscle weakness (generalized)  Chronic left shoulder pain  REFERRING DIAG: Radiculopathy, cervical region [M54.12]   Rationale for Evaluation and Treatment:  Rehabilitation  SUBJECTIVE:  PERTINENT PAST HISTORY:  Neuropathy, bipolar, PTSD, cervical spinal fusion C4-C6, depression and anxiety, ptsd, L knee pain with multiple surgeries      PRECAUTIONS: None  WEIGHT BEARING RESTRICTIONS No  FALLS:  Has patient fallen in last 6 months? No, Number of falls: 0  MOI/History of condition:  Onset date: December of 2025  SUBJECTIVE STATEMENT  05/30/2024:  Pt reports that she started on a steroid pack d/t severe back pain over the weekend and is feeling better  now.  EVAL: Theresa Cantrell is a 48 y.o. female who presents to clinic with chief complaint of neck pain with bil radicular sxs L>R.  She feels weak at times in the L arm.  Denies any clumsiness or frequently dropping objects.  She has intermittent n/t in bil hands L>R.  Denies any significant change in gait or balance.   Red flags:  denies   Pain:  Are you having pain? Yes Pain location: neck L>R arm NPRS scale:  Current: 3/10 Aggravating factors: neck movements (L rotation and  s/b) Relieving factors: rest Pain description: intermittent  Occupation: NA  Assistive Device: SPC  Hand Dominance: R  Patient Goals/Specific Activities: reduce pain   OBJECTIVE:   DIAGNOSTIC FINDINGS:  XRs of the cervical spine from 03/24/2024 were independently reviewed and  interpreted, showing anterior cervical instrumentation from C4-C6.  There  appears to be fusion mass spanning the former disc spaces.  No lucency  seen around the screws.  None screws backed out.  Small anterior  osteophyte formation off the inferior aspect of the C3 vertebra.  Disc  height loss at C6/7.  No evidence of instability on flexion/extension  views.  No fracture or dislocation seen.   GENERAL OBSERVATION: Antalgic gait using SPC     SENSATION: Light touch: Appears intact on exam subjective reports of n/t in L>R hand   PALPATION: TTP bil sub occipitals, exquisite TTP L UT  Cervical ROM  ROM ROM  (Eval)  Flexion 30  Extension 30  Right lateral flexion 30  Left lateral flexion 25*  Right rotation 45  Left rotation 40*  Flexion rotation (normal is 30 degrees)   Flexion rotation (normal is 30 degrees)     (Blank rows = not tested, N = WNL, * = concordant pain)  UPPER EXTREMITY MMT:  MMT Right (Eval) Left (Eval)  Shoulder flexion 4 4  Shoulder abduction (C5) c c  Shoulder ER    Shoulder IR    Middle trapezius    Lower trapezius    Shoulder extension    Grip strength 36 lbs 15 lbs  Shoulder shrug (C4) c c  Elbow flexion (C6) c c  Elbow ext (C7) c c  Thumb ext (C8) c c  Finger abd (T1) c c  DNF endurance 9''    (Blank rows = not tested, score listed is out of 5 possible points.  N = WNL, D = diminished, C = clear for gross weakness with myotome testing, * = concordant pain with testing)   SPECIAL TESTS:  Spurling's: + L  Hoffman's:  (-)   JOINT MOBILITY TESTING:  Hypomobile throughout Cx spine  PATIENT SURVEYS:  NDI: 30/50    TODAY'S TREATMENT:  [facility] Adult PT  Treatment:                                                DATE: 05/30/2024 Therapeutic Exercise: UBE 3'/3' fwd and backward for warm up while taking subjective Corner pec stretch - 45'' x3 Ball roll up wall for thoracic ext - 2x10 Shoulder rolls - 6# - 2x10 Standing W back at wall 2 x 10 - RTB - correction for shoulder hike Standing bil ER with back at wall - RTB - 2x10 DNF lift 20 sec x 3  Manual Therapy: STW L UT and scalenes  HOME EXERCISE PROGRAM: Access Code: [email or web address] URL: [email or web address] Date: 04/11/2024 Prepared by: Helene Gasmen  Exercises - Supine Cervical Retraction with Towel  - 2 x daily - 7 x weekly - 2 sets - 10 reps - 5 second hold - Standing Shoulder Horizontal Abduction with Resistance  - 1 x daily - 7 x weekly - 3 sets - 10 reps - Seated Shoulder Rolls  - 1 x daily - 7 x weekly - 3 sets - 10 reps  Treatment priorities   Eval 6/24 7/22      L UT manual/TDN '' theracane      posture '' Cervical isometrics      DNF endurance ''        SAPS L shoulder?                 ASSESSMENT:  CLINICAL IMPRESSION:  Zakaiya tolerated session well with no adverse reaction.  Pt reports lower pain today.  Concentrated on mid trap strengthening in standing with back support.  Pt cued to reduce L > R shoulder hike with W at wall as well as high row.  Pt reports continued benefit from manual therapy.  EVAL: Maura is a 48 y.o. female who presents to clinic with signs and sxs consistent with neck pain with intermittent radicular sxs.  No signs of gross neurological weakness on exam but pt does have significantly reduced grip strength L vs R.  Consistent with central vs bil foraminal stenosis.  Sxs correspond to x-ray showing height loss at C6-C7.  Brandi will benefit form skilled therapy to address relevant deficits and improve comfort with daily tasks such as driving and lifting.  OBJECTIVE IMPAIRMENTS: Pain, cervical ROM, UE strength  ACTIVITY  LIMITATIONS: lifting, head turns while driving, reading  PERSONAL FACTORS: See medical history and pertinent history   REHAB POTENTIAL: Good  CLINICAL DECISION MAKING: Evolving/moderate complexity  EVALUATION COMPLEXITY: Moderate   GOALS:   SHORT TERM GOALS: Target date: 05/10/2024   Lavayah will be >75% HEP compliant to improve carryover between sessions and facilitate independent management of condition  Evaluation: ongoing Goal status: MET   LONG TERM GOALS: Target date: 06/07/2024   Fallynn will self report >/= 50% decrease in pain from evaluation to improve function in daily tasks  Evaluation/Baseline: 8/10 max pain Goal status: INITIAL   2.  Chere will show a >/= 10 pt improvement in their NDI score (MCID ~20% or 10/50 pts) as a proxy for functional improvement  Evaluation/Baseline: 30/50 pts Goal status: INITIAL   3.  Athena will show significant improvement in L grip strength  Evaluation/Baseline: 15 lbs Goal status: INITIAL   4.  Janisse will report <1 instance of N/T in UE at night  Evaluation/Baseline: limited Goal status: INITIAL   PLAN: PT FREQUENCY: 1-2x/week  PT DURATION: 8 weeks  PLANNED INTERVENTIONS:  97164- PT Re-evaluation, 97110-Therapeutic exercises, 97530- Therapeutic activity, [id number]- Neuromuscular re-education, 97535- Self Care, 02859- Manual therapy, [id number]- Gait training, [id number]- Aquatic Therapy, [phone]- Electrical stimulation (manual), [id number]- Vasopneumatic device, [id number]- Traction (mechanical), [id number]- Ionotophoresis 4mg /ml Dexamethasone , Taping, Dry Needling, Joint manipulation, and Spinal manipulation.   Helene BRAVO Aracelia Brinson PT 05/30/24 12:35 PM Phone: [phone] Fax: [phone]   I just finished a MCD eval/recert.  Name: MARINA BOERNER  MRN: [id number] Please request 1x/week for 8 weeks.  Check all conditions that are expected to impact treatment: Musculoskeletal disorders   I DID put a charge in.  Check all possible CPT  codes: 02889- Therapeutic Exercise, [phone]- Neuro Re-education,  02883 - Gait Training, 02859 - Manual Therapy, [id number] - Therapeutic Activities, [id number] - Self Care, [id number] - Re-evaluation, [id number] - Mechanical traction, and [id number] - Aquatic therapy   Thank you!  MCD - Secure

## 2024-06-01 ENCOUNTER — Ambulatory Visit: Payer: MEDICAID | Admitting: Physical Therapy

## 2024-06-01 ENCOUNTER — Encounter: Payer: Self-pay | Admitting: Physical Therapy

## 2024-06-01 DIAGNOSIS — G8929 Other chronic pain: Secondary | ICD-10-CM

## 2024-06-01 DIAGNOSIS — M6281 Muscle weakness (generalized): Secondary | ICD-10-CM

## 2024-06-01 DIAGNOSIS — M542 Cervicalgia: Secondary | ICD-10-CM

## 2024-06-01 NOTE — Therapy (Signed)
 OUTPATIENT PHYSICAL THERAPY DAILY NOTE   Patient Name: Theresa Cantrell MRN: [id number] DOB:03-Jul-1976, 47 y.o., female Today's Date: 06/01/2024   PT End of Session - 06/01/24 0845     Visit Number 8    Number of Visits --   1-2x/week   Date for PT Re-Evaluation 06/06/24    Authorization Type [employer]    Authorization Time Period 04/11/24-05/27/24    Authorization - Visit Number 8    Authorization - Number of Visits 12    PT Start Time 0845    PT Stop Time 0926    PT Time Calculation (min) 41 min          Past Medical History:  Diagnosis Date   Anxiety    Arthritis    Bipolar 1 disorder (HCC)    Depression    GERD (gastroesophageal reflux disease)    Hypertension    PTSD (post-traumatic stress disorder)    Past Surgical History:  Procedure Laterality Date   ABDOMINAL HYSTERECTOMY     ANTERIOR CERVICAL DECOMP/DISCECTOMY FUSION N/A 06/06/2021   Procedure: C4-5, C5-6 ANTERIOR CERVICAL DISCECTOMY FUSION, ALLOGRAFT, PLATE;  Surgeon: Barbarann Oneil BROCKS, MD;  Location: [facility] OR;  Service: Orthopedics;  Laterality: N/A;   CHOLECYSTECTOMY     DILATION AND CURETTAGE OF UTERUS     FOOT SURGERY Bilateral    two pins in each foot   JOINT REPLACEMENT Left 2018   partial knee replacement   KNEE SURGERY Left 2018   partial knee replacement   NECK SURGERY  06/06/2021   C4-5, C5-6 ANTERIOR CERVICAL DISCECTOMY FUSION, ALLOGRAFT, PLATE   Patient Active Problem List   Diagnosis Date Noted   Symptomatic mammary hypertrophy 05/15/2024   Back pain 05/15/2024   Gastroesophageal reflux disease with esophagitis without hemorrhage 01/17/2024   Cervical strain, acute, sequela 12/21/2023   Protrusion of lumbar intervertebral disc 03/24/2023   Depression with anxiety 11/30/2022   Partial thickness rotator cuff tear 11/16/2022   Adjustment disorder with mixed anxiety and depressed mood 02/11/2022   Rotator cuff tendinitis, right 11/14/2021   S/P cervical spinal fusion 06/17/2021   COVID-19 vaccine  series not completed 12/20/2020   Influenza vaccine refused 11/07/2020   Essential hypertension 11/07/2020   Obesity (BMI 30.0-34.9) 11/07/2020   Tobacco dependence 11/07/2020   Gastroesophageal reflux disease without esophagitis 11/07/2020   History of abnormal cervical Pap smear 11/07/2020   History of bipolar disorder 11/07/2020   Bipolar 2 disorder, major depressive episode (HCC) 06/12/2020   PTSD (post-traumatic stress disorder) 06/12/2020   Chronic pain of left knee 10/05/2019   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   Neuropathy 02/07/2014   Hot flashes 02/07/2014   Weight loss 02/07/2014   Anxiety     PCP: Vicci Barnie NOVAK, MD  REFERRING PROVIDER: Vicci Barnie NOVAK, MD  THERAPY DIAG:  Cervicalgia  Muscle weakness (generalized)  Chronic left shoulder pain  REFERRING DIAG: Radiculopathy, cervical region [M54.12]   Rationale for Evaluation and Treatment:  Rehabilitation  SUBJECTIVE:  PERTINENT PAST HISTORY:  Neuropathy, bipolar, PTSD, cervical spinal fusion C4-C6, depression and anxiety, ptsd, L knee pain with multiple surgeries      PRECAUTIONS: None  WEIGHT BEARING RESTRICTIONS No  FALLS:  Has patient fallen in last 6 months? No, Number of falls: 0  MOI/History of condition:  Onset date: December of 2025  SUBJECTIVE STATEMENT  06/01/2024:  Pt reports that her neck pain is improving.  She is still on the steroid.  EVAL: Theresa Cantrell  is a 47 y.o. female who presents to clinic with chief complaint of neck pain with bil radicular sxs L>R.  She feels weak at times in the L arm.  Denies any clumsiness or frequently dropping objects.  She has intermittent n/t in bil hands L>R.  Denies any significant change in gait or balance.   Red flags:  denies   Pain:  Are you having pain? Yes Pain location: neck L>R arm NPRS scale:  Current: 3/10 Aggravating factors: neck movements (L rotation and s/b) Relieving factors: rest Pain  description: intermittent  Occupation: NA  Assistive Device: SPC  Hand Dominance: R  Patient Goals/Specific Activities: reduce pain   OBJECTIVE:   DIAGNOSTIC FINDINGS:  XRs of the cervical spine from 03/24/2024 were independently reviewed and  interpreted, showing anterior cervical instrumentation from C4-C6.  There  appears to be fusion mass spanning the former disc spaces.  No lucency  seen around the screws.  None screws backed out.  Small anterior  osteophyte formation off the inferior aspect of the C3 vertebra.  Disc  height loss at C6/7.  No evidence of instability on flexion/extension  views.  No fracture or dislocation seen.   GENERAL OBSERVATION: Antalgic gait using SPC     SENSATION: Light touch: Appears intact on exam subjective reports of n/t in L>R hand   PALPATION: TTP bil sub occipitals, exquisite TTP L UT  Cervical ROM  ROM ROM  (Eval)  Flexion 30  Extension 30  Right lateral flexion 30  Left lateral flexion 25*  Right rotation 45  Left rotation 40*  Flexion rotation (normal is 30 degrees)   Flexion rotation (normal is 30 degrees)     (Blank rows = not tested, N = WNL, * = concordant pain)  UPPER EXTREMITY MMT:  MMT Right (Eval) Left (Eval)  Shoulder flexion 4 4  Shoulder abduction (C5) c c  Shoulder ER    Shoulder IR    Middle trapezius    Lower trapezius    Shoulder extension    Grip strength 36 lbs 15 lbs  Shoulder shrug (C4) c c  Elbow flexion (C6) c c  Elbow ext (C7) c c  Thumb ext (C8) c c  Finger abd (T1) c c  DNF endurance 9''    (Blank rows = not tested, score listed is out of 5 possible points.  N = WNL, D = diminished, C = clear for gross weakness with myotome testing, * = concordant pain with testing)   SPECIAL TESTS:  Spurling's: + L  Hoffman's:  (-)   JOINT MOBILITY TESTING:  Hypomobile throughout Cx spine  PATIENT SURVEYS:  NDI: 30/50    TODAY'S TREATMENT:  [facility] Adult PT Treatment:                                                 DATE: 06/01/2024 Therapeutic Exercise: UBE 3'/3' fwd and backward for warm up while taking subjective Corner pec stretch - 45'' x3 Ball roll up wall for thoracic ext - 2x10 Suitcase carry - 7# - 1 lap ea Standing W back at wall 2 x 10 - RTB - cues to reduce shoulder hike  Standing bil ER with back at wall - RTB - 2x10 DNF lift 20 sec x 3 Prone W on bolster - 2x10  Manual Therapy: STW L UT, cervical paraspinals, and  scalenes      HOME EXERCISE PROGRAM: Access Code: [email or web address] URL: [email or web address] Date: 04/11/2024 Prepared by: Theresa Cantrell  Exercises - Supine Cervical Retraction with Towel  - 2 x daily - 7 x weekly - 2 sets - 10 reps - 5 second hold - Standing Shoulder Horizontal Abduction with Resistance  - 1 x daily - 7 x weekly - 3 sets - 10 reps - Seated Shoulder Rolls  - 1 x daily - 7 x weekly - 3 sets - 10 reps  Treatment priorities   Eval 6/24 7/22      L UT manual/TDN '' theracane      posture '' Cervical isometrics      DNF endurance ''        SAPS L shoulder?                 ASSESSMENT:  CLINICAL IMPRESSION:  Theresa Cantrell tolerated session well with no adverse reaction.  Pt with continued improvement in shoulder and neck pain.  She is still on steroid which is likely helpful.  Able to add in prone W with no increase in pain, but fatigue noted.  Will likely D/C next week if her sxs remain under control off the steroid.  EVAL: Theresa Cantrell is a 48 y.o. female who presents to clinic with signs and sxs consistent with neck pain with intermittent radicular sxs.  No signs of gross neurological weakness on exam but pt does have significantly reduced grip strength L vs R.  Consistent with central vs bil foraminal stenosis.  Sxs correspond to x-ray showing height loss at C6-C7.  Theresa Cantrell will benefit form skilled therapy to address relevant deficits and improve comfort with daily tasks such as driving and lifting.  OBJECTIVE IMPAIRMENTS:  Pain, cervical ROM, UE strength  ACTIVITY LIMITATIONS: lifting, head turns while driving, reading  PERSONAL FACTORS: See medical history and pertinent history   REHAB POTENTIAL: Good  CLINICAL DECISION MAKING: Evolving/moderate complexity  EVALUATION COMPLEXITY: Moderate   GOALS:   SHORT TERM GOALS: Target date: 05/10/2024   Salma will be >75% HEP compliant to improve carryover between sessions and facilitate independent management of condition  Evaluation: ongoing Goal status: MET   LONG TERM GOALS: Target date: 06/07/2024   Mishka will self report >/= 50% decrease in pain from evaluation to improve function in daily tasks  Evaluation/Baseline: 8/10 max pain Goal status: INITIAL   2.  Becca will show a >/= 10 pt improvement in their NDI score (MCID ~20% or 10/50 pts) as a proxy for functional improvement  Evaluation/Baseline: 30/50 pts Goal status: INITIAL   3.  Nikolina will show significant improvement in L grip strength  Evaluation/Baseline: 15 lbs Goal status: INITIAL   4.  Dayla will report <1 instance of N/T in UE at night  Evaluation/Baseline: limited Goal status: INITIAL   PLAN: PT FREQUENCY: 1-2x/week  PT DURATION: 8 weeks  PLANNED INTERVENTIONS:  97164- PT Re-evaluation, 97110-Therapeutic exercises, 97530- Therapeutic activity, [id number]- Neuromuscular re-education, 97535- Self Care, 02859- Manual therapy, [id number]- Gait training, [id number]- Aquatic Therapy, [phone]- Electrical stimulation (manual), [id number]- Vasopneumatic device, [id number]- Traction (mechanical), [id number]- Ionotophoresis 4mg /ml Dexamethasone , Taping, Dry Needling, Joint manipulation, and Spinal manipulation.   Theresa Cantrell PT 06/01/24 9:31 AM Phone: [phone] Fax: [phone]   I just finished a MCD eval/recert.  Name: Theresa Cantrell  MRN: [id number] Please request 1x/week for 8 weeks.  Check all conditions that are expected to impact treatment: Musculoskeletal disorders   I DID put a  charge in.  Check all possible CPT codes: 02889- Therapeutic Exercise, [phone]- Neuro Re-education, [phone] - Gait Training, [phone] - Manual Therapy, 97530 - Therapeutic Activities, 97535 - Self Care, [phone] - Re-evaluation, [id number] - Mechanical traction, and [id number] - Aquatic therapy   Thank you!  MCD - Secure

## 2024-06-03 ENCOUNTER — Other Ambulatory Visit (HOSPITAL_COMMUNITY): Payer: Self-pay

## 2024-06-03 ENCOUNTER — Other Ambulatory Visit: Payer: Self-pay | Admitting: Internal Medicine

## 2024-06-03 DIAGNOSIS — K2100 Gastro-esophageal reflux disease with esophagitis, without bleeding: Secondary | ICD-10-CM

## 2024-06-05 ENCOUNTER — Ambulatory Visit: Payer: MEDICAID | Attending: Orthopedic Surgery | Admitting: Physical Therapy

## 2024-06-05 ENCOUNTER — Other Ambulatory Visit: Payer: Self-pay

## 2024-06-05 ENCOUNTER — Encounter: Payer: Self-pay | Admitting: Physical Therapy

## 2024-06-05 DIAGNOSIS — M6281 Muscle weakness (generalized): Secondary | ICD-10-CM | POA: Diagnosis present

## 2024-06-05 DIAGNOSIS — M542 Cervicalgia: Secondary | ICD-10-CM | POA: Diagnosis present

## 2024-06-05 DIAGNOSIS — G8929 Other chronic pain: Secondary | ICD-10-CM | POA: Diagnosis present

## 2024-06-05 DIAGNOSIS — M25512 Pain in left shoulder: Secondary | ICD-10-CM | POA: Diagnosis present

## 2024-06-05 MED ORDER — OMEPRAZOLE 20 MG PO CPDR
20.0000 mg | DELAYED_RELEASE_CAPSULE | Freq: Two times a day (BID) | ORAL | 0 refills | Status: DC
  Filled 2024-06-05: qty 60, 30d supply, fill #0

## 2024-06-05 NOTE — Telephone Encounter (Signed)
 Requested Prescriptions  Pending Prescriptions Disp Refills   omeprazole  (PRILOSEC) 20 MG capsule 60 capsule 0    Sig: Take 1 capsule (20 mg total) by mouth 2 (two) times daily before a meal.     Gastroenterology: Proton Pump Inhibitors Passed - 06/05/2024  3:39 PM      Passed - Valid encounter within last 12 months    Recent Outpatient Visits           1 month ago Class 2 severe obesity due to excess calories with serious comorbidity and body mass index (BMI) of 35.0 to 35.9 in adult [facility])   [facility] [place]. [facility] Vicci Sober B, MD   3 months ago Class 2 severe obesity due to excess calories with serious comorbidity and body mass index (BMI) of 36.0 to 36.9 in adult [facility])   [facility] Dept Of [place]. [facility] Vicci Sober B, MD   3 months ago Class 2 severe obesity due to excess calories with serious comorbidity and body mass index (BMI) of 37.0 to 37.9 in adult [facility])   [facility] Of [place]. [facility] Fleeta Morris, [place] L, RPH-CPP   4 months ago Class 2 severe obesity due to excess calories with serious comorbidity and body mass index (BMI) of 37.0 to 37.9 in adult [facility])   [facility] Dept Of [place]. [facility] Vicci Sober B, MD   5 months ago Chronic low back pain, unspecified back pain laterality, unspecified whether sciatica present   [facility] - A Dept Of [place]. [facility] Vicci Sober NOVAK, MD

## 2024-06-05 NOTE — Therapy (Signed)
 OUTPATIENT PHYSICAL THERAPY DAILY NOTE   Patient Name: Theresa Cantrell MRN: [id number] DOB:1975-12-29, 48 y.o., female Today's Date: 06/05/2024   PT End of Session - 06/05/24 0759     Visit Number 9    Number of Visits --   1-2x/week   Date for PT Re-Evaluation 06/06/24    Authorization Type [employer]    Authorization Time Period 04/11/24-05/27/24    Authorization - Visit Number 9    Authorization - Number of Visits 12    PT Start Time 0800    PT Stop Time 0841    PT Time Calculation (min) 41 min          Past Medical History:  Diagnosis Date   Anxiety    Arthritis    Bipolar 1 disorder (HCC)    Depression    GERD (gastroesophageal reflux disease)    Hypertension    PTSD (post-traumatic stress disorder)    Past Surgical History:  Procedure Laterality Date   ABDOMINAL HYSTERECTOMY     ANTERIOR CERVICAL DECOMP/DISCECTOMY FUSION N/A 06/06/2021   Procedure: C4-5, C5-6 ANTERIOR CERVICAL DISCECTOMY FUSION, ALLOGRAFT, PLATE;  Surgeon: Barbarann Oneil BROCKS, MD;  Location: [facility] OR;  Service: Orthopedics;  Laterality: N/A;   CHOLECYSTECTOMY     DILATION AND CURETTAGE OF UTERUS     FOOT SURGERY Bilateral    two pins in each foot   JOINT REPLACEMENT Left 2018   partial knee replacement   KNEE SURGERY Left 2018   partial knee replacement   NECK SURGERY  06/06/2021   C4-5, C5-6 ANTERIOR CERVICAL DISCECTOMY FUSION, ALLOGRAFT, PLATE   Patient Active Problem List   Diagnosis Date Noted   Symptomatic mammary hypertrophy 05/15/2024   Back pain 05/15/2024   Gastroesophageal reflux disease with esophagitis without hemorrhage 01/17/2024   Cervical strain, acute, sequela 12/21/2023   Protrusion of lumbar intervertebral disc 03/24/2023   Depression with anxiety 11/30/2022   Partial thickness rotator cuff tear 11/16/2022   Adjustment disorder with mixed anxiety and depressed mood 02/11/2022   Rotator cuff tendinitis, right 11/14/2021   S/P cervical spinal fusion 06/17/2021   COVID-19 vaccine  series not completed 12/20/2020   Influenza vaccine refused 11/07/2020   Essential hypertension 11/07/2020   Obesity (BMI 30.0-34.9) 11/07/2020   Tobacco dependence 11/07/2020   Gastroesophageal reflux disease without esophagitis 11/07/2020   History of abnormal cervical Pap smear 11/07/2020   History of bipolar disorder 11/07/2020   Bipolar 2 disorder, major depressive episode (HCC) 06/12/2020   PTSD (post-traumatic stress disorder) 06/12/2020   Chronic pain of left knee 10/05/2019   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   Neuropathy 02/07/2014   Hot flashes 02/07/2014   Weight loss 02/07/2014   Anxiety     PCP: Vicci Barnie NOVAK, MD  REFERRING PROVIDER: Vicci Barnie NOVAK, MD  THERAPY DIAG:  Cervicalgia  Muscle weakness (generalized)  Chronic left shoulder pain  REFERRING DIAG: Radiculopathy, cervical region [M54.12]   Rationale for Evaluation and Treatment:  Rehabilitation  SUBJECTIVE:  PERTINENT PAST HISTORY:  Neuropathy, bipolar, PTSD, cervical spinal fusion C4-C6, depression and anxiety, ptsd, L knee pain with multiple surgeries      PRECAUTIONS: None  WEIGHT BEARING RESTRICTIONS No  FALLS:  Has patient fallen in last 6 months? No, Number of falls: 0  MOI/History of condition:  Onset date: December of 2025  SUBJECTIVE STATEMENT  06/05/2024:  Pt reports that her neck is doing well.  She had no problems over the weekend.  Continued low back  pain.  EVAL: Theresa Cantrell is a 48 y.o. female who presents to clinic with chief complaint of neck pain with bil radicular sxs L>R.  She feels weak at times in the L arm.  Denies any clumsiness or frequently dropping objects.  She has intermittent n/t in bil hands L>R.  Denies any significant change in gait or balance.   Red flags:  denies   Pain:  Are you having pain? Yes Pain location: neck L>R arm NPRS scale:  Current: 3/10 Aggravating factors: neck movements (L rotation and  s/b) Relieving factors: rest Pain description: intermittent  Occupation: NA  Assistive Device: SPC  Hand Dominance: R  Patient Goals/Specific Activities: reduce pain   OBJECTIVE:   DIAGNOSTIC FINDINGS:  XRs of the cervical spine from 03/24/2024 were independently reviewed and  interpreted, showing anterior cervical instrumentation from C4-C6.  There  appears to be fusion mass spanning the former disc spaces.  No lucency  seen around the screws.  None screws backed out.  Small anterior  osteophyte formation off the inferior aspect of the C3 vertebra.  Disc  height loss at C6/7.  No evidence of instability on flexion/extension  views.  No fracture or dislocation seen.   GENERAL OBSERVATION: Antalgic gait using SPC     SENSATION: Light touch: Appears intact on exam subjective reports of n/t in L>R hand   PALPATION: TTP bil sub occipitals, exquisite TTP L UT  Cervical ROM  ROM ROM  (Eval)  Flexion 30  Extension 30  Right lateral flexion 30  Left lateral flexion 25*  Right rotation 45  Left rotation 40*  Flexion rotation (normal is 30 degrees)   Flexion rotation (normal is 30 degrees)     (Blank rows = not tested, N = WNL, * = concordant pain)  UPPER EXTREMITY MMT:  MMT Right (Eval) Left (Eval)  Shoulder flexion 4 4  Shoulder abduction (C5) c c  Shoulder ER    Shoulder IR    Middle trapezius    Lower trapezius    Shoulder extension    Grip strength 36 lbs 15 lbs  Shoulder shrug (C4) c c  Elbow flexion (C6) c c  Elbow ext (C7) c c  Thumb ext (C8) c c  Finger abd (T1) c c  DNF endurance 9''    (Blank rows = not tested, score listed is out of 5 possible points.  N = WNL, D = diminished, C = clear for gross weakness with myotome testing, * = concordant pain with testing)   SPECIAL TESTS:  Spurling's: + L  Hoffman's:  (-)   JOINT MOBILITY TESTING:  Hypomobile throughout Cx spine  PATIENT SURVEYS:  NDI: 30/50    TODAY'S TREATMENT:  [facility] Adult PT  Treatment:                                                DATE: 06/05/2024 Therapeutic Exercise: UBE 3'/3' fwd and backward for warm up while taking subjective Corner pec stretch - 45'' x3 Ball roll up wall for thoracic ext - 2x10 Suitcase carry - 8# - 1 lap ea Standing W back at wall 2 x 10 - RTB - cues to reduce shoulder hike  DNF lift 25 sec x 3 Prone W  2x10 and T x10  2x10  Manual Therapy: STW L UT, cervical paraspinals, and scalenes  HOME EXERCISE PROGRAM: Access Code: [email or web address] URL: [email or web address] Date: 04/11/2024 Prepared by: Helene Gasmen  Exercises - Supine Cervical Retraction with Towel  - 2 x daily - 7 x weekly - 2 sets - 10 reps - 5 second hold - Standing Shoulder Horizontal Abduction with Resistance  - 1 x daily - 7 x weekly - 3 sets - 10 reps - Seated Shoulder Rolls  - 1 x daily - 7 x weekly - 3 sets - 10 reps  Treatment priorities   Eval 6/24 7/22      L UT manual/TDN '' theracane      posture '' Cervical isometrics      DNF endurance ''        SAPS L shoulder?                 ASSESSMENT:  CLINICAL IMPRESSION:  Aryka tolerated session well with no adverse reaction.  Able to progress time with DNF and weight with suitcase carry.  Pt with overall tolerable pain levels at home.  Plan on D/C next visit.  EVAL: Danese is a 48 y.o. female who presents to clinic with signs and sxs consistent with neck pain with intermittent radicular sxs.  No signs of gross neurological weakness on exam but pt does have significantly reduced grip strength L vs R.  Consistent with central vs bil foraminal stenosis.  Sxs correspond to x-ray showing height loss at C6-C7.  Shiri will benefit form skilled therapy to address relevant deficits and improve comfort with daily tasks such as driving and lifting.  OBJECTIVE IMPAIRMENTS: Pain, cervical ROM, UE strength  ACTIVITY LIMITATIONS: lifting, head turns while driving, reading  PERSONAL FACTORS: See medical  history and pertinent history   REHAB POTENTIAL: Good  CLINICAL DECISION MAKING: Evolving/moderate complexity  EVALUATION COMPLEXITY: Moderate   GOALS:   SHORT TERM GOALS: Target date: 05/10/2024   Emmalyne will be >75% HEP compliant to improve carryover between sessions and facilitate independent management of condition  Evaluation: ongoing Goal status: MET   LONG TERM GOALS: Target date: 06/07/2024   Raegen will self report >/= 50% decrease in pain from evaluation to improve function in daily tasks  Evaluation/Baseline: 8/10 max pain Goal status: INITIAL   2.  Elizebeth will show a >/= 10 pt improvement in their NDI score (MCID ~20% or 10/50 pts) as a proxy for functional improvement  Evaluation/Baseline: 30/50 pts Goal status: INITIAL   3.  Totiana will show significant improvement in L grip strength  Evaluation/Baseline: 15 lbs Goal status: INITIAL   4.  Shelton will report <1 instance of N/T in UE at night  Evaluation/Baseline: limited Goal status: INITIAL   PLAN: PT FREQUENCY: 1-2x/week  PT DURATION: 8 weeks  PLANNED INTERVENTIONS:  97164- PT Re-evaluation, 97110-Therapeutic exercises, 97530- Therapeutic activity, [id number]- Neuromuscular re-education, 97535- Self Care, 02859- Manual therapy, [id number]- Gait training, [id number]- Aquatic Therapy, [phone]- Electrical stimulation (manual), [id number]- Vasopneumatic device, [id number]- Traction (mechanical), [id number]- Ionotophoresis 4mg /ml Dexamethasone , Taping, Dry Needling, Joint manipulation, and Spinal manipulation.   Helene BRAVO Nahla Lukin PT 06/05/24 8:46 AM Phone: [phone] Fax: [phone]   I just finished a [facility] eval/recert.  Name: KATHARYN SCHAUER  MRN: [id number] Please request 1x/week for 8 weeks.  Check all conditions that are expected to impact treatment: Musculoskeletal disorders   I DID put a charge in.  Check all possible CPT codes: 02889- Therapeutic Exercise, [phone]- Neuro Re-education, [phone] - Gait Training, [phone] -  Manual Therapy, 97530 - Therapeutic Activities, [phone] - Self Care, [phone] -  Re-evaluation, [id number] - Mechanical traction, and [id number] - Aquatic therapy   Thank you!  [facility] - Secure

## 2024-06-08 ENCOUNTER — Ambulatory Visit: Payer: MEDICAID | Admitting: Physical Therapy

## 2024-06-08 ENCOUNTER — Encounter: Payer: Self-pay | Admitting: Physical Therapy

## 2024-06-08 ENCOUNTER — Telehealth: Payer: Self-pay | Admitting: Orthopedic Surgery

## 2024-06-08 DIAGNOSIS — M542 Cervicalgia: Secondary | ICD-10-CM

## 2024-06-08 DIAGNOSIS — M6281 Muscle weakness (generalized): Secondary | ICD-10-CM

## 2024-06-08 DIAGNOSIS — G8929 Other chronic pain: Secondary | ICD-10-CM

## 2024-06-08 NOTE — Progress Notes (Signed)
 Medical Nutrition Therapy  Appointment Start time:  [phone] Appointment End time: [phone]  Primary concerns today: weight management  Referral diagnosis: Class 2 severe obesity due to excess calories with serious comorbidity and body mass index (BMI) of 35.0 to 35.9 in adult [facility])  Preferred learning style: no preference indicated Learning readiness: contemplating   NUTRITION ASSESSMENT   Clinical Medical Hx:  Past Medical History:  Diagnosis Date   Anxiety    Arthritis    Bipolar 1 disorder (HCC)    Depression    GERD (gastroesophageal reflux disease)    Hypertension    PTSD (post-traumatic stress disorder)     Medications:  Current Outpatient Medications:    albuterol  (VENTOLIN  HFA) 108 (90 Base) MCG/ACT inhaler, Inhale 2 puffs into the lungs every 6 (six) hours as needed for wheezing or shortness of breath. (Patient not taking: Reported on 04/24/2024), Disp: 8 g, Rfl: 0   ARIPiprazole  (ABILIFY ) 15 MG tablet, Take 1 tablet (15 mg total) by mouth daily., Disp: 30 tablet, Rfl: 2   carboxymethylcellulose (ARTIFICIAL TEARS) 1 % ophthalmic solution, Apply 1 drop to eye 2 (two) times daily as needed., Disp: 30 mL, Rfl: 0   Cholecalciferol  (VITAMIN D ) 50 MCG (2000 UT) tablet, Take 1 tablet (2,000 Units total) by mouth daily., Disp: 100 tablet, Rfl: 1   cyclobenzaprine  (FLEXERIL ) 10 MG tablet, Take 0.5-1 tablets (5-10 mg total) by mouth 2 (two) times daily as needed for muscle spasms., Disp: 20 tablet, Rfl: 0   cyclobenzaprine  (FLEXERIL ) 10 MG tablet, Take 1 tablet (10 mg total) by mouth every 12 (twelve) hours as needed for spasms, Disp: 60 tablet, Rfl: 1   diclofenac  (VOLTAREN ) 75 MG EC tablet, Take 1 tablet (75 mg total) by mouth 2 (two) times daily., Disp: 30 tablet, Rfl: 2   FLUoxetine  (PROZAC ) 20 MG capsule, Take 1 capsule (20 mg total) by mouth daily., Disp: 30 capsule, Rfl: 2   furosemide  (LASIX ) 20 MG tablet, TAKE 1 TABLET BY MOUTH ON MONDAY, WEDNESDAY AND FRIDAY, Disp: 36 tablet, Rfl:  1   gabapentin (NEURONTIN) 300 MG capsule, Take 300 mg by mouth 3 (three) times daily., Disp: , Rfl:    HYDROcodone -acetaminophen  (NORCO/VICODIN) 5-325 MG tablet, Take 1 tablet by mouth every 8 (eight) hours as needed., Disp: , Rfl:    HYDROcodone -acetaminophen  (NORCO/VICODIN) 5-325 MG tablet, Take 1 tablet by mouth every 8 (eight) hours as needed for 7 days, Disp: 21 tablet, Rfl: 0   HYDROcodone -acetaminophen  (NORCO/VICODIN) 5-325 MG tablet, Take 1 tablet by mouth daily as needed for severe pain., Disp: 30 tablet, Rfl: 0   HYDROcodone -acetaminophen  (NORCO/VICODIN) 5-325 MG tablet, Take 1 tablet by mouth every 8 (eight) hours as needed for pain up to 30 days., Disp: 90 tablet, Rfl: 0   hydrOXYzine  (ATARAX ) 10 MG tablet, Take 1 tablet (10 mg total) by mouth 3 (three) times daily as needed., Disp: 75 tablet, Rfl: 1   meloxicam  (MOBIC ) 7.5 MG tablet, Take 1 tablet (7.5 mg total) by mouth 2 (two) times daily as needed., Disp: 60 tablet, Rfl: 1   NIFEdipine  (PROCARDIA -XL/NIFEDICAL-XL) 30 MG 24 hr tablet, Take 1 tablet by mouth daily, Disp: 90 tablet, Rfl: 0   omeprazole  (PRILOSEC) 20 MG capsule, Take 1 capsule (20 mg total) by mouth 2 (two) times daily before a meal., Disp: 60 capsule, Rfl: 0   prazosin  (MINIPRESS ) 2 MG capsule, Take 1 capsule (2 mg total) by mouth at bedtime., Disp: 30 capsule, Rfl: 2   Semaglutide -Weight Management (WEGOVY ) 1.7 MG/0.75ML  SOAJ, Inject 1.7 mg into the skin once a week., Disp: 3 mL, Rfl: 0   Semaglutide -Weight Management (WEGOVY ) 2.4 MG/0.75ML SOAJ, Inject 2.4 mg into the skin once a week., Disp: 9 mL, Rfl: 1   traZODone  (DESYREL ) 150 MG tablet, Take 1 tablet (150 mg total) by mouth at bedtime as needed for sleep., Disp: 30 tablet, Rfl: 1   valACYclovir  (VALTREX ) 1000 MG tablet, Take 1 tablet (1,000 mg total) by mouth daily., Disp: 90 tablet, Rfl: 1   valsartan  (DIOVAN ) 80 MG tablet, Take 1 tablet (80 mg total) by mouth daily., Disp: 90 tablet, Rfl: 1  Labs:  Lab Results   Component Value Date   HGBA1C 5.7 (H) 04/24/2024   Notable Signs/Symptoms:  Wt Readings from Last 3 Encounters:  05/15/24 210 lb (95.3 kg)  04/24/24 212 lb (96.2 kg)  04/07/24 211 lb 1.6 oz (95.8 kg)   Lifestyle & Dietary Hx  Pt presents today alone for follow up. Pt reports avoids sugary sweetened beverages 95% of the time and would like to aim for 100%. Pt reports selecting juice couple times weekly and this was strongly discouraged. Pt reports typical intake of 2-3 meals stating skipping a meal twice weekly.  Pt reports her appetite is up and down with wegovy .  Pt reports walking to mailbox every other day stating for an unknown amount of time.  Pt reports back pain with lifting restriction of 20# currently.   Pt reports she continue to limit fried foods to twice monthly.  Pt reports she has home health 2 hours on 5 days weekly.  All Pt's questions were answered during this encounter.    Estimated daily fluid intake: ~67 ounces daily Supplements: daily MVI  Sleep: 5-8 hours nightly with CPAP  Stress / self-care: medium; therapy, talk to family/friends, play with the dog  Current average weekly physical activity: walking as tolerated   24-Hr Dietary Recall First Meal: 1-2 slices of wheat bread with egg, sausage or bacon, cheese or 1 banana Snack: none Second Meal: burger on bun, lettuce, tomato, mayo, fries, water or baked chicken breast, broccoli with cheese, potatoes Snack: none Third Meal: lasagana, corn  Snack: none Beverages: water, gatorade zero, sweet tea, orange juice, diet soda  NUTRITION DIAGNOSIS  NB-1.1 Food and nutrition-related knowledge deficit As related to limited nutrition related education.  As evidenced by Pt's reports and dietary recall-continue, improving  Learning Style & Readiness for Change Teaching method utilized: Visual & Auditory  Demonstrated degree of understanding via: Teach Back  Barriers to learning/adherence to lifestyle change: limited  physical activity  Goals Established by Pt Avoid skipping meals  Aim for full omission of sugar sweetened beverages   MONITORING & EVALUATION Dietary intake, weekly physical activity  Next Steps  Patient is to PRN

## 2024-06-08 NOTE — Telephone Encounter (Signed)
 Patient called and said that she had to do her PT first because she could get covered for the MRI and now she ready for the MRI to done again. She is done with PT. [phone]

## 2024-06-08 NOTE — Therapy (Addendum)
 PHYSICAL THERAPY DISCHARGE SUMMARY  Visits from Start of Care: 10  Current functional level related to goals / functional outcomes: See assessment/goals   Remaining deficits: See assessment/goals   Education / Equipment: HEP and D/C plans  Patient agrees to discharge. Patient goals were partially met. Patient is being discharged due to lack of progress.   Patient Name: Theresa Cantrell MRN: [id number] DOB:[birth year], 48 y.o., female Today's Date: 06/08/2024   PT End of Session - 06/08/24 0849     Visit Number 10    Number of Visits --   1-2x/week   Date for PT Re-Evaluation 06/08/24    Authorization Type [employer]    Authorization Time Period 04/11/24-05/27/24    Authorization - Visit Number 10    Authorization - Number of Visits 12    PT Start Time 0848    PT Stop Time 0928    PT Time Calculation (min) 40 min          Past Medical History:  Diagnosis Date   Anxiety    Arthritis    Bipolar 1 disorder (HCC)    Depression    GERD (gastroesophageal reflux disease)    Hypertension    PTSD (post-traumatic stress disorder)    Past Surgical History:  Procedure Laterality Date   ABDOMINAL HYSTERECTOMY     ANTERIOR CERVICAL DECOMP/DISCECTOMY FUSION N/A 06/06/2021   Procedure: C4-5, C5-6 ANTERIOR CERVICAL DISCECTOMY FUSION, ALLOGRAFT, PLATE;  Surgeon: Barbarann Oneil BROCKS, MD;  Location: [facility] OR;  Service: Orthopedics;  Laterality: N/A;   CHOLECYSTECTOMY     DILATION AND CURETTAGE OF UTERUS     FOOT SURGERY Bilateral    two pins in each foot   JOINT REPLACEMENT Left 2018   partial knee replacement   KNEE SURGERY Left 2018   partial knee replacement   NECK SURGERY  06/06/2021   C4-5, C5-6 ANTERIOR CERVICAL DISCECTOMY FUSION, ALLOGRAFT, PLATE   Patient Active Problem List   Diagnosis Date Noted   Symptomatic mammary hypertrophy 05/15/2024   Back pain 05/15/2024   Gastroesophageal reflux disease with esophagitis without hemorrhage 01/17/2024   Cervical strain, acute, sequela  12/21/2023   Protrusion of lumbar intervertebral disc 03/24/2023   Depression with anxiety 11/30/2022   Partial thickness rotator cuff tear 11/16/2022   Adjustment disorder with mixed anxiety and depressed mood 02/11/2022   Rotator cuff tendinitis, right 11/14/2021   S/P cervical spinal fusion 06/17/2021   COVID-19 vaccine series not completed 12/20/2020   Influenza vaccine refused 11/07/2020   Essential hypertension 11/07/2020   Obesity (BMI 30.0-34.9) 11/07/2020   Tobacco dependence 11/07/2020   Gastroesophageal reflux disease without esophagitis 11/07/2020   History of abnormal cervical Pap smear 11/07/2020   History of bipolar disorder 11/07/2020   Bipolar 2 disorder, major depressive episode (HCC) 06/12/2020   PTSD (post-traumatic stress disorder) 06/12/2020   Chronic pain of left knee 10/05/2019   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   Neuropathy 02/07/2014   Hot flashes 02/07/2014   Weight loss 02/07/2014   Anxiety     PCP: Vicci Barnie NOVAK, MD  REFERRING PROVIDER: Georgina Ozell LABOR, MD  THERAPY DIAG:  Cervicalgia - Plan: PT plan of care cert/re-cert  Muscle weakness (generalized) - Plan: PT plan of care cert/re-cert  Chronic left shoulder pain - Plan: PT plan of care cert/re-cert  REFERRING DIAG: Radiculopathy, cervical region [M54.12]   Rationale for Evaluation and Treatment:  Rehabilitation  SUBJECTIVE:  PERTINENT PAST HISTORY:  Neuropathy, bipolar, PTSD, cervical spinal fusion C4-C6,  depression and anxiety, ptsd, L knee pain with multiple surgeries      PRECAUTIONS: None  WEIGHT BEARING RESTRICTIONS No  FALLS:  Has patient fallen in last 6 months? No, Number of falls: 0  MOI/History of condition:  Onset date: December of 2025  SUBJECTIVE STATEMENT  06/08/2024:  Pt reports that her neck is doing well, a little flared d/t the weather today.  She had no problems over the weekend.  Continued low back pain.  EVAL: Theresa Cantrell is a 48 y.o. female who presents to clinic with chief complaint of neck pain with bil radicular sxs L>R.  She feels weak at times in the L arm.  Denies any clumsiness or frequently dropping objects.  She has intermittent n/t in bil hands L>R.  Denies any significant change in gait or balance.   Red flags:  denies   Pain:  Are you having pain? Yes Pain location: neck L>R arm NPRS scale:  Current: 3/10 Aggravating factors: neck movements (L rotation and s/b) Relieving factors: rest Pain description: intermittent  Occupation: NA  Assistive Device: SPC  Hand Dominance: R  Patient Goals/Specific Activities: reduce pain   OBJECTIVE:   DIAGNOSTIC FINDINGS:  XRs of the cervical spine from 03/24/2024 were independently reviewed and  interpreted, showing anterior cervical instrumentation from C4-C6.  There  appears to be fusion mass spanning the former disc spaces.  No lucency  seen around the screws.  None screws backed out.  Small anterior  osteophyte formation off the inferior aspect of the C3 vertebra.  Disc  height loss at C6/7.  No evidence of instability on flexion/extension  views.  No fracture or dislocation seen.   GENERAL OBSERVATION: Antalgic gait using SPC     SENSATION: Light touch: Appears intact on exam subjective reports of n/t in L>R hand   PALPATION: TTP bil sub occipitals, exquisite TTP L UT  Cervical ROM  ROM ROM  (Eval)  Flexion 30  Extension 30  Right lateral flexion 30  Left lateral flexion 25*  Right rotation 45  Left rotation 40*  Flexion rotation (normal is 30 degrees)   Flexion rotation (normal is 30 degrees)     (Blank rows = not tested, N = WNL, * = concordant pain)  UPPER EXTREMITY MMT:  MMT Right (Eval) Left (Eval)  Shoulder flexion 4 4  Shoulder abduction (C5) c c  Shoulder ER    Shoulder IR    Middle trapezius    Lower trapezius    Shoulder extension    Grip strength 36 lbs 15 lbs  Shoulder shrug (C4) c c  Elbow  flexion (C6) c c  Elbow ext (C7) c c  Thumb ext (C8) c c  Finger abd (T1) c c  DNF endurance 9''    (Blank rows = not tested, score listed is out of 5 possible points.  N = WNL, D = diminished, C = clear for gross weakness with myotome testing, * = concordant pain with testing)   SPECIAL TESTS:  Spurling's: + L  Hoffman's:  (-)   JOINT MOBILITY TESTING:  Hypomobile throughout Cx spine  PATIENT SURVEYS:  NDI: 30/50    TODAY'S TREATMENT:  [facility] Adult PT Treatment:                                                DATE:  06/08/2024 Therapeutic Exercise: UBE 3'/3' fwd and backward for warm up while taking subjective Corner pec stretch - 45'' x3 Suitcase carry - 8# - 1 lap ea Bil ER with RTB - 2x10  Therapeutic Activity  collecting information for goals, checking progress, and reviewing with patient  Manual Therapy: STW L UT, cervical paraspinals, and scalenes    HOME EXERCISE PROGRAM: Access Code: [email or web address] URL: [email or web address] Date: 04/11/2024 Prepared by: Helene Gasmen  Exercises - Supine Cervical Retraction with Towel  - 2 x daily - 7 x weekly - 2 sets - 10 reps - 5 second hold - Standing Shoulder Horizontal Abduction with Resistance  - 1 x daily - 7 x weekly - 3 sets - 10 reps - Seated Shoulder Rolls  - 1 x daily - 7 x weekly - 3 sets - 10 reps  Treatment priorities   Eval 6/24 7/22      L UT manual/TDN '' theracane      posture '' Cervical isometrics      DNF endurance ''        SAPS L shoulder?                 ASSESSMENT:  CLINICAL IMPRESSION:  Corynn tolerated session well with no adverse reaction.  Pt is currently in a flare which she attributes to the rainy weather.  She has met her neck pain goal with self reported 80% improvement.  Her other long term goals are little changed from evaluation.  After discussion with patient, we agree that she will D/C with HEP and follow up with Dr. Georgina for next steps with potential MRI.  EVAL:  Sarinah is a 48 y.o. female who presents to clinic with signs and sxs consistent with neck pain with intermittent radicular sxs.  No signs of gross neurological weakness on exam but pt does have significantly reduced grip strength L vs R.  Consistent with central vs bil foraminal stenosis.  Sxs correspond to x-ray showing height loss at C6-C7.  Judieth will benefit form skilled therapy to address relevant deficits and improve comfort with daily tasks such as driving and lifting.  OBJECTIVE IMPAIRMENTS: Pain, cervical ROM, UE strength  ACTIVITY LIMITATIONS: lifting, head turns while driving, reading  PERSONAL FACTORS: See medical history and pertinent history   REHAB POTENTIAL: Good  CLINICAL DECISION MAKING: Evolving/moderate complexity  EVALUATION COMPLEXITY: Moderate   GOALS:   SHORT TERM GOALS: Target date: 05/10/2024   Shaylyn will be >75% HEP compliant to improve carryover between sessions and facilitate independent management of condition  Evaluation: ongoing Goal status: MET   LONG TERM GOALS: Target date: 06/07/2024   Kashonda will self report >/= 50% decrease in pain from evaluation to improve function in daily tasks  Evaluation/Baseline: 8/10 max pain 8/7: 80% Goal status: MET   2.  Lakeisa will show a >/= 10 pt improvement in their NDI score (MCID ~20% or 10/50 pts) as a proxy for functional improvement  Evaluation/Baseline: 30/50 pts 8/7: 30/50 Goal status: NOT MET   3.  Alonah will show significant improvement in L grip strength  Evaluation/Baseline: 15 lbs 8/7: L: 15 lbs Goal status: NOT MET   4.  Khamora will report <1 instance of N/T in UE at night  Evaluation/Baseline: limited 8/7: still frequent, particularly at night Goal status: NOT MET   PLAN: PT FREQUENCY: 1-2x/week  PT DURATION: 8 weeks  PLANNED INTERVENTIONS:  97164- PT Re-evaluation, 97110-Therapeutic exercises, 97530- Therapeutic activity, [id number]- Neuromuscular re-education, 97535- Self  Care, 02859-  Manual therapy, [id number]- Gait training, 02886- Aquatic Therapy, [id number]- Electrical stimulation (manual), [id number]- Vasopneumatic device, [id number]- Traction (mechanical), [id number]- Ionotophoresis 4mg /ml Dexamethasone , Taping, Dry Needling, Joint manipulation, and Spinal manipulation.   Helene BRAVO Jelan Batterton PT 06/08/24 10:23 AM Phone: [phone] Fax: [phone]   I just finished a [facility] eval/recert.  Name: LETISHA YERA  MRN: [id number] Please request 1x/week for 8 weeks.  Check all conditions that are expected to impact treatment: Musculoskeletal disorders   I DID put a charge in.  Check all possible CPT codes: 02889- Therapeutic Exercise, [phone]- Neuro Re-education, [id number] - Gait Training, [phone] - Manual Therapy, 97530 - Therapeutic Activities, 97535 - Self Care, [id number] - Re-evaluation, M403810 - Mechanical traction, and [id number] - Aquatic therapy   Thank you!  [facility] - Secure

## 2024-06-08 NOTE — Addendum Note (Signed)
 Addended by: DOMINIC HELENE BRAVO on: 06/08/2024 10:24 AM   Modules accepted: Orders

## 2024-06-09 ENCOUNTER — Ambulatory Visit: Payer: MEDICAID | Admitting: Dietician

## 2024-06-15 ENCOUNTER — Other Ambulatory Visit: Payer: Self-pay | Admitting: Internal Medicine

## 2024-06-15 ENCOUNTER — Other Ambulatory Visit (HOSPITAL_COMMUNITY): Payer: Self-pay

## 2024-06-15 DIAGNOSIS — I10 Essential (primary) hypertension: Secondary | ICD-10-CM

## 2024-06-15 NOTE — Telephone Encounter (Unsigned)
 Copied from CRM #[id number]. Topic: Clinical - Medication Refill >> Jun 15, 2024  8:43 AM Marissa P wrote: Medication: valsartan  (DIOVAN ) 80 MG tablet  Has the patient contacted their pharmacy? Yes (Agent: If no, request that the patient contact the pharmacy for the refill. If patient does not wish to contact the pharmacy document the reason why and proceed with request.) (Agent: If yes, when and what did the pharmacy advise?)  This is the patient's preferred pharmacy:   [facility] [address] [place] [address] Phone: [phone] Fax: [phone]  Is this the correct pharmacy for this prescription? Yes If no, delete pharmacy and type the correct one.   Has the prescription been filled recently? Yes  Is the patient out of the medication? Yes  Has the patient been seen for an appointment in the last year OR does the patient have an upcoming appointment? Yes  Can we respond through MyChart? Yes  Agent: Please be advised that Rx refills may take up to 3 business days. We ask that you follow-up with your pharmacy.

## 2024-06-16 ENCOUNTER — Encounter: Payer: MEDICAID | Attending: Internal Medicine | Admitting: Dietician

## 2024-06-16 DIAGNOSIS — Z6835 Body mass index (BMI) 35.0-35.9, adult: Secondary | ICD-10-CM | POA: Insufficient documentation

## 2024-06-16 DIAGNOSIS — E66812 Obesity, class 2: Secondary | ICD-10-CM | POA: Insufficient documentation

## 2024-06-19 ENCOUNTER — Other Ambulatory Visit (HOSPITAL_COMMUNITY): Payer: Self-pay

## 2024-06-19 MED ORDER — VALSARTAN 80 MG PO TABS
80.0000 mg | ORAL_TABLET | Freq: Every day | ORAL | 0 refills | Status: DC
  Filled 2024-06-19: qty 90, 90d supply, fill #0

## 2024-06-19 NOTE — Telephone Encounter (Signed)
 Requested Prescriptions  Pending Prescriptions Disp Refills   valsartan  (DIOVAN ) 80 MG tablet 90 tablet 0    Sig: Take 1 tablet (80 mg total) by mouth daily.     Cardiovascular:  Angiotensin Receptor Blockers Failed - 06/19/2024 10:47 AM      Failed - Cr in normal range and within 180 days    Creat  Date Value Ref Range Status  10/01/2021 1.12 (H) 0.50 - 0.99 mg/dL Final   Creatinine, Ser  Date Value Ref Range Status  09/17/2023 0.69 0.57 - 1.00 mg/dL Final         Failed - K in normal range and within 180 days    Potassium  Date Value Ref Range Status  09/17/2023 5.1 3.5 - 5.2 mmol/L Final         Passed - Patient is not pregnant      Passed - Last BP in normal range    BP Readings from Last 1 Encounters:  05/15/24 130/76         Passed - Valid encounter within last 6 months    Recent Outpatient Visits           1 month ago Class 2 severe obesity due to excess calories with serious comorbidity and body mass index (BMI) of 35.0 to 35.9 in adult [facility])   [facility] [place] Vicci Sober B, MD   3 months ago Class 2 severe obesity due to excess calories with serious comorbidity and body mass index (BMI) of 36.0 to 36.9 in adult [facility])   [facility] [facility] [place]. [facility] Vicci Sober B, MD   3 months ago Class 2 severe obesity due to excess calories with serious comorbidity and body mass index (BMI) of 37.0 to 37.9 in adult [facility])   [facility] [facility] [place]. [facility] Fleeta Morris, [place] L, RPH-CPP   5 months ago Class 2 severe obesity due to excess calories with serious comorbidity and body mass index (BMI) of 37.0 to 37.9 in adult [facility])   [place] [facility] [place]. [facility] Vicci Sober B, MD   5 months ago Chronic low back pain, unspecified back pain laterality, unspecified whether sciatica  present   [place] [facility] - [facility] Dept Of [place]. [facility] Vicci Sober NOVAK, MD

## 2024-06-21 ENCOUNTER — Other Ambulatory Visit: Payer: Self-pay

## 2024-06-23 ENCOUNTER — Telehealth: Payer: Self-pay | Admitting: Internal Medicine

## 2024-06-23 NOTE — Telephone Encounter (Signed)
 Confirmed appt for 8/25

## 2024-06-26 ENCOUNTER — Other Ambulatory Visit (HOSPITAL_COMMUNITY): Payer: Self-pay

## 2024-06-26 ENCOUNTER — Encounter: Payer: Self-pay | Admitting: Internal Medicine

## 2024-06-26 ENCOUNTER — Ambulatory Visit: Payer: MEDICAID | Attending: Internal Medicine | Admitting: Internal Medicine

## 2024-06-26 VITALS — BP 130/85 | HR 89 | Temp 98.5°F | Ht 65.0 in | Wt 214.0 lb

## 2024-06-26 DIAGNOSIS — R7303 Prediabetes: Secondary | ICD-10-CM | POA: Diagnosis not present

## 2024-06-26 DIAGNOSIS — E66812 Obesity, class 2: Secondary | ICD-10-CM | POA: Diagnosis not present

## 2024-06-26 DIAGNOSIS — M545 Low back pain, unspecified: Secondary | ICD-10-CM | POA: Diagnosis not present

## 2024-06-26 DIAGNOSIS — I10 Essential (primary) hypertension: Secondary | ICD-10-CM

## 2024-06-26 DIAGNOSIS — Z803 Family history of malignant neoplasm of breast: Secondary | ICD-10-CM

## 2024-06-26 DIAGNOSIS — Z23 Encounter for immunization: Secondary | ICD-10-CM

## 2024-06-26 DIAGNOSIS — Z6835 Body mass index (BMI) 35.0-35.9, adult: Secondary | ICD-10-CM

## 2024-06-26 DIAGNOSIS — N6489 Other specified disorders of breast: Secondary | ICD-10-CM

## 2024-06-26 DIAGNOSIS — Z87891 Personal history of nicotine dependence: Secondary | ICD-10-CM

## 2024-06-26 DIAGNOSIS — G8929 Other chronic pain: Secondary | ICD-10-CM

## 2024-06-26 DIAGNOSIS — H1013 Acute atopic conjunctivitis, bilateral: Secondary | ICD-10-CM

## 2024-06-26 MED ORDER — OLOPATADINE HCL 0.1 % OP SOLN
1.0000 [drp] | Freq: Two times a day (BID) | OPHTHALMIC | 12 refills | Status: AC | PRN
  Filled 2024-06-26: qty 5, 50d supply, fill #0
  Filled 2024-08-28 – 2024-09-25 (×2): qty 5, 50d supply, fill #1
  Filled 2024-11-23: qty 5, 50d supply, fill #2

## 2024-06-26 MED ORDER — VALSARTAN 160 MG PO TABS
160.0000 mg | ORAL_TABLET | Freq: Every day | ORAL | 1 refills | Status: DC
  Filled 2024-06-26: qty 90, 90d supply, fill #0
  Filled 2024-09-24: qty 90, 90d supply, fill #1
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
## 2024-06-26 NOTE — Patient Instructions (Addendum)
 VISIT SUMMARY:  Today, we discussed your blood pressure and weight management, as well as your upcoming breast reduction surgery. We also addressed your chronic back pain, dry eyes, and general health maintenance, including vaccinations.  YOUR PLAN:  -HYPERTENSION: Hypertension means high blood pressure. Your blood pressure has been slightly elevated, likely due to a recent gap in your medication. We have increased your valsartan  dose to 160 mg daily and recommend limiting your dietary sodium intake. We will also perform baseline blood tests to check your blood count, kidney, and liver function.  -OBESITY AND PREDIABETES: Prediabetes means your blood sugar levels are higher than normal but not high enough to be classified as diabetes. Despite being on the maximum dose of Wegovy , your weight has remained stable. We encourage you to continue working with your nutritionist and consider water aerobics at the [facility]. We also recommend getting a home scale to monitor your weight. A referral to a nutritionist at [facility] has been submitted.  -CHRONIC LOW BACK PAIN: Chronic low back pain is long-term pain in the lower back. You are currently managing this with hydrocodone  as needed and are not considering surgery at this time. We will continue with your current pain management plan and consider an L5 transforaminal injection as a nonoperative treatment option.  -DRY EYES WITH ALLERGIC CONJUNCTIVITIS: Allergic conjunctivitis is inflammation of the eye due to allergies, causing dry, itchy, and burning eyes. Since over-the-counter drops have not been effective, we have prescribed Patanol eye drops to use as needed.  -PLANNED BREAST REDUCTION SURGERY: You are scheduled for breast reduction surgery on September 24. To prevent any complications with anesthesia, you should stop taking Wegovy  one week before the surgery. We will also perform baseline blood tests to check your blood count, kidney, and liver  function.  -GENERAL HEALTH MAINTENANCE: You are due for several vaccines, including flu, pneumonia, and shingles. We administered the first dose of the shingles vaccine today and plan to give the second dose in 2-6 months. We recommend getting the flu and pneumonia 20 vaccines at an outside pharmacy.  INSTRUCTIONS:  Please follow up with the recommended blood tests and vaccinations. Stop taking Wegovy  one week before your breast reduction surgery. Continue working with your nutritionist and consider water aerobics for weight management. Use Patanol eye drops as needed for your dry eyes. Follow up with us  as needed for your blood pressure and back pain management.

## 2024-06-27 ENCOUNTER — Ambulatory Visit: Payer: Self-pay | Admitting: Internal Medicine

## 2024-06-27 ENCOUNTER — Ambulatory Visit: Payer: MEDICAID | Admitting: Skilled Nursing Facility1

## 2024-06-27 LAB — COMPREHENSIVE METABOLIC PANEL WITH GFR
ALT: 17 IU/L (ref 0–32)
AST: 15 IU/L (ref 0–40)
Albumin: 4.2 g/dL (ref 3.9–4.9)
Alkaline Phosphatase: 115 IU/L (ref 44–121)
BUN/Creatinine Ratio: 23 (ref 9–23)
BUN: 19 mg/dL (ref 6–24)
Bilirubin Total: <0.2 mg/dL (ref 0.0–1.2)
CO2: 23 mmol/L (ref 20–29)
Calcium: 9.6 mg/dL (ref 8.7–10.2)
Chloride: 104 mmol/L (ref 96–106)
Creatinine, Ser: 0.84 mg/dL (ref 0.57–1.00)
Globulin, Total: 2.5 g/dL (ref 1.5–4.5)
Glucose: 91 mg/dL (ref 70–99)
Potassium: 4.5 mmol/L (ref 3.5–5.2)
Sodium: 141 mmol/L (ref 134–144)
Total Protein: 6.7 g/dL (ref 6.0–8.5)
eGFR: 85 mL/min/1.73 (ref >59)

## 2024-06-27 LAB — CBC
Hematocrit: 41.3 % (ref 34.0–46.6)
Hemoglobin: 13.2 g/dL (ref 11.1–15.9)
MCH: 30.3 pg (ref 26.6–33.0)
MCHC: 32 g/dL (ref 31.5–35.7)
MCV: 95 fL (ref 79–97)
Platelets: 268 x10E3/uL (ref 150–450)
RBC: 4.36 x10E6/uL (ref 3.77–5.28)
RDW: 13.2 % (ref 11.7–15.4)
WBC: 5.1 x10E3/uL (ref 3.4–10.8)

## 2024-06-28 ENCOUNTER — Encounter: Payer: Self-pay | Admitting: Internal Medicine

## 2024-06-29 ENCOUNTER — Ambulatory Visit (INDEPENDENT_AMBULATORY_CARE_PROVIDER_SITE_OTHER): Payer: MEDICAID | Admitting: Mental Health

## 2024-06-29 ENCOUNTER — Other Ambulatory Visit (HOSPITAL_COMMUNITY): Payer: Self-pay

## 2024-06-29 DIAGNOSIS — F4310 Post-traumatic stress disorder, unspecified: Secondary | ICD-10-CM | POA: Diagnosis not present

## 2024-06-29 DIAGNOSIS — F3181 Bipolar II disorder: Secondary | ICD-10-CM

## 2024-06-29 DIAGNOSIS — F419 Anxiety disorder, unspecified: Secondary | ICD-10-CM

## 2024-06-29 MED ORDER — HYDROCODONE-ACETAMINOPHEN 5-325 MG PO TABS
1.0000 | ORAL_TABLET | Freq: Every day | ORAL | 0 refills | Status: DC | PRN
  Filled 2024-06-29: qty 30, 30d supply, fill #0

## 2024-06-29 MED ORDER — HYDROCODONE-ACETAMINOPHEN 5-325 MG PO TABS
1.0000 | ORAL_TABLET | Freq: Three times a day (TID) | ORAL | 0 refills | Status: DC
  Filled 2024-06-29 – 2024-07-08 (×2): qty 60, 20d supply, fill #0

## 2024-06-29 MED ORDER — HYDROCODONE-ACETAMINOPHEN 5-325 MG PO TABS
1.0000 | ORAL_TABLET | Freq: Every day | ORAL | 0 refills | Status: DC
  Filled 2024-06-29: qty 30, 30d supply, fill #0
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
## 2024-07-05 ENCOUNTER — Other Ambulatory Visit (HOSPITAL_COMMUNITY): Payer: Self-pay

## 2024-07-07 ENCOUNTER — Other Ambulatory Visit: Payer: Self-pay

## 2024-07-07 ENCOUNTER — Other Ambulatory Visit: Payer: Self-pay | Admitting: Internal Medicine

## 2024-07-07 DIAGNOSIS — I10 Essential (primary) hypertension: Secondary | ICD-10-CM

## 2024-07-07 DIAGNOSIS — K2100 Gastro-esophageal reflux disease with esophagitis, without bleeding: Secondary | ICD-10-CM

## 2024-07-07 MED ORDER — OMEPRAZOLE 20 MG PO CPDR
20.0000 mg | DELAYED_RELEASE_CAPSULE | Freq: Two times a day (BID) | ORAL | 0 refills | Status: DC
  Filled 2024-07-07: qty 180, 90d supply, fill #0

## 2024-07-07 NOTE — Telephone Encounter (Signed)
 Requested Prescriptions  Pending Prescriptions Disp Refills   furosemide  (LASIX ) 20 MG tablet [Pharmacy Med Name: Furosemide  20 MG Oral Tablet] 36 tablet 0    Sig: TAKE 1 TABLET BY MOUTH ON MONDAY, WEDNESDAY AND FRIDAY     Cardiovascular:  Diuretics - Loop Failed - 07/07/2024  4:18 PM      Failed - Mg Level in normal range and within 180 days    No results found for: MG       Passed - K in normal range and within 180 days    Potassium  Date Value Ref Range Status  06/26/2024 4.5 3.5 - 5.2 mmol/L Final         Passed - Ca in normal range and within 180 days    Calcium  Date Value Ref Range Status  06/26/2024 9.6 8.7 - 10.2 mg/dL Final         Passed - Na in normal range and within 180 days    Sodium  Date Value Ref Range Status  06/26/2024 141 134 - 144 mmol/L Final         Passed - Cr in normal range and within 180 days    Creat  Date Value Ref Range Status  10/01/2021 1.12 (H) 0.50 - 0.99 mg/dL Final   Creatinine, Ser  Date Value Ref Range Status  06/26/2024 0.84 0.57 - 1.00 mg/dL Final         Passed - Cl in normal range and within 180 days    Chloride  Date Value Ref Range Status  06/26/2024 104 96 - 106 mmol/L Final         Passed - Last BP in normal range    BP Readings from Last 1 Encounters:  06/26/24 130/85         Passed - Valid encounter within last 6 months    Recent Outpatient Visits           1 week ago Essential hypertension   [facility] [place]. [facility] Vicci Sober B, MD   2 months ago Class 2 severe obesity due to excess calories with serious comorbidity and body mass index (BMI) of 35.0 to 35.9 in adult [facility])   [facility] [place]. [facility] Vicci Sober B, MD   4 months ago Class 2 severe obesity due to excess calories with serious comorbidity and body mass index (BMI) of 36.0 to 36.9 in adult [facility])   [place] [facility] Dept  Of [place]. [facility] Vicci Sober B, MD   4 months ago Class 2 severe obesity due to excess calories with serious comorbidity and body mass index (BMI) of 37.0 to 37.9 in adult [facility])   [place] [facility] Of [place]. [facility] Fleeta Morris, [place] L, RPH-CPP   5 months ago Class 2 severe obesity due to excess calories with serious comorbidity and body mass index (BMI) of 37.0 to 37.9 in adult [facility])   [place] [facility] Dept Of [place]. [facility] Vicci Sober NOVAK, MD       Future Appointments             In 3 months Vicci Sober NOVAK, MD [facility] - [facility] Dept Of [place]. [facility] [place]

## 2024-07-07 NOTE — Telephone Encounter (Signed)
 Requested Prescriptions  Pending Prescriptions Disp Refills   omeprazole  (PRILOSEC) 20 MG capsule 180 capsule 0    Sig: Take 1 capsule (20 mg total) by mouth 2 (two) times daily before a meal.     Gastroenterology: Proton Pump Inhibitors Passed - 07/07/2024  4:17 PM      Passed - Valid encounter within last 12 months    Recent Outpatient Visits           1 week ago Essential hypertension   [place] [facility] [place]. [facility] Vicci Sober B, MD   2 months ago Class 2 severe obesity due to excess calories with serious comorbidity and body mass index (BMI) of 35.0 to 35.9 in adult [facility])   [place] [facility] Dept Of [place]. [facility] Vicci Sober B, MD   4 months ago Class 2 severe obesity due to excess calories with serious comorbidity and body mass index (BMI) of 36.0 to 36.9 in adult [facility])   [place] [facility] Dept [facility] [place]. [facility] Vicci Sober B, MD   4 months ago Class 2 severe obesity due to excess calories with serious comorbidity and body mass index (BMI) of 37.0 to 37.9 in adult [facility])   [place] [facility] Dept Of [place]. [facility] Fleeta Morris, [place] L, RPH-CPP   5 months ago Class 2 severe obesity due to excess calories with serious comorbidity and body mass index (BMI) of 37.0 to 37.9 in adult [facility])    [facility] Dept Of [place]. [facility] Vicci Sober NOVAK, MD       Future Appointments             In 3 months Vicci Sober NOVAK, MD [facility] Dept Of [place]. [facility] [place]

## 2024-07-08 ENCOUNTER — Other Ambulatory Visit (HOSPITAL_COMMUNITY): Payer: Self-pay

## 2024-07-11 ENCOUNTER — Encounter: Payer: MEDICAID | Admitting: Physician Assistant

## 2024-07-11 ENCOUNTER — Other Ambulatory Visit: Payer: Self-pay

## 2024-07-11 MED ORDER — MELOXICAM 7.5 MG PO TABS
7.5000 mg | ORAL_TABLET | Freq: Two times a day (BID) | ORAL | 1 refills | Status: DC | PRN
  Filled 2024-07-11: qty 60, 30d supply, fill #0
  Filled 2024-08-13: qty 60, 30d supply, fill #1

## 2024-07-12 ENCOUNTER — Other Ambulatory Visit (HOSPITAL_COMMUNITY): Payer: Self-pay

## 2024-07-13 ENCOUNTER — Ambulatory Visit (INDEPENDENT_AMBULATORY_CARE_PROVIDER_SITE_OTHER): Payer: MEDICAID | Admitting: Student

## 2024-07-13 ENCOUNTER — Other Ambulatory Visit: Payer: Self-pay

## 2024-07-13 ENCOUNTER — Other Ambulatory Visit (HOSPITAL_COMMUNITY): Payer: Self-pay

## 2024-07-13 VITALS — BP 115/76 | HR 98 | Wt 212.0 lb

## 2024-07-13 DIAGNOSIS — N62 Hypertrophy of breast: Secondary | ICD-10-CM

## 2024-07-13 MED ORDER — ONDANSETRON HCL 4 MG PO TABS
4.0000 mg | ORAL_TABLET | Freq: Three times a day (TID) | ORAL | 0 refills | Status: DC | PRN
  Filled 2024-07-13: qty 20, 7d supply, fill #0

## 2024-07-13 MED ORDER — CEPHALEXIN 500 MG PO CAPS
500.0000 mg | ORAL_CAPSULE | Freq: Four times a day (QID) | ORAL | 0 refills | Status: AC
  Filled 2024-07-13: qty 12, 3d supply, fill #0

## 2024-07-13 NOTE — Progress Notes (Signed)
 Patient ID: Theresa Cantrell, female    DOB: December 29, 1975, 48 y.o.   MRN: [id number]  Chief Complaint  Patient presents with   Pre-op Exam      ICD-10-CM   1. Symptomatic mammary hypertrophy  N62        History of Present Illness: Theresa Cantrell is a 48 y.o.  female  with a history of macromastia.  She presents for preoperative evaluation for upcoming procedure, Bilateral Breast Reduction with lipo suction, scheduled for 07/26/2024 with Dr.  Lowery  The patient has not had problems with anesthesia.  Patient denies any personal history of breast cancer.  She reports her mother had breast cancer.  She denies any history of cardiac disease.  She denies taking any blood thinners.  Patient reports she is not a smoker.  She states that she quit smoking 3 years ago.  Patient denies taking any birth control or hormone replacement.  She denies any history of greater than 3 miscarriages.  She denies any personal family history of blood clots or clotting diseases.  She denies any surgeries, traumas or infections.  She denies any history of stroke or heart attack.  She denies any history of Crohn's disease, ulcerative colitis, COPD or asthma.  She denies any history of cancer.  She denies any varicosities to her lower extremities.  She denies any recent fevers, chills or changes in her health.  Summary of Previous Visit: Patient was seen by Dr. Lowery on 05/15/2024.  At this visit, patient complained of upper back and neck pain due to her enlarged breasts.  On exam, her STN on the right was 34 cm and her STN on the left was 34 cm.  Her BMI was 34.9 kg/m.  Her preoperative bra size is a DD/DDD cup.  The estimated amount of breast tissue to be removed at the time of surgery was 650-700 g bilaterally.  Patient was found to be a good candidate for bilateral breast reduction.  Estimated excess breast tissue to be removed at time of surgery: 650-700 grams  Job: Does not work at this time  PMH  Significant for: GERD, anxiety, bipolar 2, PTSD, macromastia  Patient does reports she wears a CPAP at night.  Discussed with her that she may be at increased risk of complications in the perioperative period.  She expressed understanding.   Past Medical History: Allergies: Allergies  Allergen Reactions   Amlodipine  Swelling    Lower extremity edema.   Hydrochlorothiazide  Other (See Comments)    Cough   Lamotrigine Hives   Chlorthalidone  Rash and Other (See Comments)    Cramping    Current Medications:  Current Outpatient Medications:    ARIPiprazole  (ABILIFY ) 15 MG tablet, Take 1 tablet (15 mg total) by mouth daily., Disp: 30 tablet, Rfl: 2   cephALEXin  (KEFLEX ) 500 MG capsule, Take 1 capsule (500 mg total) by mouth 4 (four) times daily for 3 days., Disp: 12 capsule, Rfl: 0   Cholecalciferol  (VITAMIN D ) 50 MCG (2000 UT) tablet, Take 1 tablet (2,000 Units total) by mouth daily., Disp: 100 tablet, Rfl: 1   cyclobenzaprine  (FLEXERIL ) 10 MG tablet, Take 1 tablet (10 mg total) by mouth every 12 (twelve) hours as needed for spasms, Disp: 60 tablet, Rfl: 1   FLUoxetine  (PROZAC ) 20 MG capsule, Take 1 capsule (20 mg total) by mouth daily., Disp: 30 capsule, Rfl: 2   furosemide  (LASIX ) 20 MG tablet, TAKE 1 TABLET BY MOUTH ON MONDAY, WEDNESDAY AND FRIDAY, Disp:  36 tablet, Rfl: 0   gabapentin (NEURONTIN) 300 MG capsule, Take 300 mg by mouth 3 (three) times daily., Disp: , Rfl:    HYDROcodone -acetaminophen  (NORCO/VICODIN) 5-325 MG tablet, Take 1 tablet by mouth every 8 (eight) hours as needed., Disp: , Rfl:    HYDROcodone -acetaminophen  (NORCO/VICODIN) 5-325 MG tablet, Take 1 tablet by mouth every 8 (eight) hours as needed., Disp: 60 tablet, Rfl: 0   hydrOXYzine  (ATARAX ) 10 MG tablet, Take 1 tablet (10 mg total) by mouth 3 (three) times daily as needed., Disp: 75 tablet, Rfl: 1   meloxicam  (MOBIC ) 7.5 MG tablet, Take 1 tablet (7.5 mg total) by mouth 2 (two) times daily as needed., Disp: 60 tablet,  Rfl: 1   NIFEdipine  (PROCARDIA -XL/NIFEDICAL-XL) 30 MG 24 hr tablet, Take 1 tablet by mouth daily, Disp: 90 tablet, Rfl: 0   olopatadine  (PATANOL) 0.1 % ophthalmic solution, Place 1 drop into both eyes 2 (two) times daily as needed for allergies (as needed for allergy symptoms)., Disp: 5 mL, Rfl: 12   omeprazole  (PRILOSEC) 20 MG capsule, Take 1 capsule (20 mg total) by mouth 2 (two) times daily before a meal., Disp: 180 capsule, Rfl: 0   ondansetron  (ZOFRAN ) 4 MG tablet, Take 1 tablet (4 mg total) by mouth every 8 (eight) hours as needed for up to 20 doses for nausea or vomiting., Disp: 20 tablet, Rfl: 0   prazosin  (MINIPRESS ) 2 MG capsule, Take 1 capsule (2 mg total) by mouth at bedtime., Disp: 30 capsule, Rfl: 2   Semaglutide -Weight Management (WEGOVY ) 2.4 MG/0.75ML SOAJ, Inject 2.4 mg into the skin once a week., Disp: 9 mL, Rfl: 1   traZODone  (DESYREL ) 150 MG tablet, Take 1 tablet (150 mg total) by mouth at bedtime as needed for sleep., Disp: 30 tablet, Rfl: 1   valACYclovir  (VALTREX ) 1000 MG tablet, Take 1 tablet (1,000 mg total) by mouth daily., Disp: 90 tablet, Rfl: 1   valsartan  (DIOVAN ) 160 MG tablet, Take 1 tablet (160 mg total) by mouth daily., Disp: 90 tablet, Rfl: 1  Past Medical Problems: Past Medical History:  Diagnosis Date   Anxiety    Arthritis    Bipolar 1 disorder (HCC)    Depression    GERD (gastroesophageal reflux disease)    Hypertension    PTSD (post-traumatic stress disorder)     Past Surgical History: Past Surgical History:  Procedure Laterality Date   ABDOMINAL HYSTERECTOMY     ANTERIOR CERVICAL DECOMP/DISCECTOMY FUSION N/A 06/06/2021   Procedure: C4-5, C5-6 ANTERIOR CERVICAL DISCECTOMY FUSION, ALLOGRAFT, PLATE;  Surgeon: Barbarann Oneil BROCKS, MD;  Location: [facility] OR;  Service: Orthopedics;  Laterality: N/A;   CHOLECYSTECTOMY     DILATION AND CURETTAGE OF UTERUS     FOOT SURGERY Bilateral    two pins in each foot   JOINT REPLACEMENT Left 2018   partial knee  replacement   KNEE SURGERY Left 2018   partial knee replacement   NECK SURGERY  06/06/2021   C4-5, C5-6 ANTERIOR CERVICAL DISCECTOMY FUSION, ALLOGRAFT, PLATE    Social History: Social History   Socioeconomic History   Marital status: Single    Spouse name: Not on file   Number of children: 1   Years of education: Not on file   Highest education level: Some college, no degree  Occupational History   Not on file  Tobacco Use   Smoking status: Former    Current packs/day: 0.25    Types: Cigarettes   Smokeless tobacco: Never  Vaping Use   Vaping  status: Never Used  Substance and Sexual Activity   Alcohol use: Yes    Alcohol/week: 2.0 standard drinks of alcohol    Types: 1 Glasses of wine, 1 Cans of beer per week   Drug use: No   Sexual activity: Not Currently    Birth control/protection: Surgical  Other Topics Concern   Not on file  Social History Narrative   Not on file   Social Drivers of Health   Financial Resource Strain: High Risk (04/20/2024)   Overall Financial Resource Strain (CARDIA)    Difficulty of Paying Living Expenses: Very hard  Food Insecurity: Food Insecurity Present (04/20/2024)   Hunger Vital Sign    Worried About Running Out of Food in the Last Year: Sometimes true    Ran Out of Food in the Last Year: Often true  Transportation Needs: No Transportation Needs (04/20/2024)   PRAPARE - Administrator, Civil Service (Medical): No    Lack of Transportation (Non-Medical): No  Physical Activity: Insufficiently Active (04/20/2024)   Exercise Vital Sign    Days of Exercise per Week: 3 days    Minutes of Exercise per Session: 10 min  Stress: Stress Concern Present (04/20/2024)   [employer] of Occupational Health - Occupational Stress Questionnaire    Feeling of Stress: Very much  Social Connections: Socially Isolated (04/20/2024)   Social Connection and Isolation Panel    Frequency of Communication with Friends and Family: More than three  times a week    Frequency of Social Gatherings with Friends and Family: Three times a week    Attends Religious Services: Never    Active Member of Clubs or Organizations: No    Attends Banker Meetings: Not on file    Marital Status: Never married  Intimate Partner Violence: Not At Risk (11/26/2023)   Humiliation, Afraid, Rape, and Kick questionnaire    Fear of Current or Ex-Partner: No    Emotionally Abused: No    Physically Abused: No    Sexually Abused: No    Family History: Family History  Problem Relation Age of Onset   Hypertension Mother    Cancer Mother        breast   Anxiety disorder Mother    Hypertension Father    Schizophrenia Father    Bipolar disorder Father    Anxiety disorder Father    Parkinson's disease Father    Colon cancer Neg Hx    Colon polyps Neg Hx    Esophageal cancer Neg Hx    Rectal cancer Neg Hx    Stomach cancer Neg Hx     Review of Systems: Denies any recent fevers, chills or changes in her health  Physical Exam: Vital Signs BP 115/76 (BP Location: Left Arm, Patient Position: Sitting, Cuff Size: Normal)   Pulse 98   Wt 212 lb (96.2 kg)   SpO2 93%   BMI 35.28 kg/m   Physical Exam  Constitutional:      General: Not in acute distress.    Appearance: Normal appearance. Not ill-appearing.  HENT:     Head: Normocephalic and atraumatic.  Neck:     Musculoskeletal: Normal range of motion.  Cardiovascular:     Rate and Rhythm: Normal rate Pulmonary:     Effort: Pulmonary effort is normal. No respiratory distress.  Musculoskeletal: Normal range of motion.  Skin:    General: Skin is warm and dry.     Findings: No erythema or rash.  Neurological:  Mental Status: Alert and oriented to person, place, and time. Mental status is at baseline.  Psychiatric:        Mood and Affect: Mood normal.        Behavior: Behavior normal.    Assessment/Plan: The patient is scheduled for bilateral breast reduction with Dr.  Lowery.  Risks, benefits, and alternatives of procedure discussed, questions answered and consent obtained.    Smoking Status: Non-smoker; Counseling Given?  N/A Last Mammogram: 01/26/2024; Results: BI-RADS Category 1 negative  Caprini Score: 4; Risk Factors include: Age, BMI > 25, and length of planned surgery. Recommendation for mechanical prophylaxis. Encourage early ambulation.   Pictures obtained: @consult   Post-op Rx sent to pharmacy: Zofran , Keflex  -patient currently receives 5-325 mg Norco from pain management provider.  Recommended that she follow-up with them in regards to possible further pain pills for postop pain.  She expressed understanding.  I recommended that the patient hold her Lasix  and valsartan  the day of surgery.  Discussed with her to hold her Mobic  and supplements 1 week prior to surgery.  Discussed with her to hold her Wegovy  2 doses prior to surgery.  Patient expressed understanding.  Patient was provided with the breast reduction and General Surgical Risk consent document and Pain Medication Agreement prior to their appointment.  They had adequate time to read through the risk consent documents and Pain Medication Agreement. We also discussed them in person together during this preop appointment. All of their questions were answered to their satisfaction.  Recommended calling if they have any further questions.  Risk consent form and Pain Medication Agreement to be scanned into patient's chart.  The risk that can be encountered with breast reduction were discussed and include the following but not limited to these:  Breast asymmetry, fluid accumulation, firmness of the breast, inability to breast feed, loss of nipple or areola, skin loss, decrease or no nipple sensation, fat necrosis of the breast tissue, bleeding, infection, healing delay.  There are risks of anesthesia, changes to skin sensation and injury to nerves or blood vessels.  The muscle can be temporarily or  permanently injured.  You may have an allergic reaction to tape, suture, glue, blood products which can result in skin discoloration, swelling, pain, skin lesions, poor healing.  Any of these can lead to the need for revisonal surgery or stage procedures.  A reduction has potential to interfere with diagnostic procedures.  Nipple or breast piercing can increase risks of infection.  This procedure is best done when the breast is fully developed.  Changes in the breast will continue to occur over time.  Pregnancy can alter the outcomes of previous breast reduction surgery, weight gain and weigh loss can also effect the long term appearance.   The consent was obtained with risks and complications reviewed which included bleeding, pain, scar, infection and the risk of anesthesia.  The patients questions were answered to the patients expressed satisfaction.   Patient has psych history including PTSD and bipolar disorder.  She states that she has a therapist that she follows up closely with and has informed her therapist that she will be undergoing surgery.  I discussed with the patient that surgery can be emotionally and mentally taxing and may be difficult.  Recommended that she have close left follow-up with her therapist throughout the perioperative period.  She expressed understanding.  Patient states that she has notified all of her medical providers of her upcoming surgery.    Electronically signed by: Estefana BRAVO  Andris, PA-C 07/13/2024 3:20 PM

## 2024-07-13 NOTE — H&P (View-Only) (Signed)
 Patient ID: Theresa Cantrell, female    DOB: December 29, 1975, 48 y.o.   MRN: [id number]  Chief Complaint  Patient presents with   Pre-op Exam      ICD-10-CM   1. Symptomatic mammary hypertrophy  N62        History of Present Illness: Theresa Cantrell is a 48 y.o.  female  with a history of macromastia.  She presents for preoperative evaluation for upcoming procedure, Bilateral Breast Reduction with lipo suction, scheduled for 07/26/2024 with Dr.  Lowery  The patient has not had problems with anesthesia.  Patient denies any personal history of breast cancer.  She reports her mother had breast cancer.  She denies any history of cardiac disease.  She denies taking any blood thinners.  Patient reports she is not a smoker.  She states that she quit smoking 3 years ago.  Patient denies taking any birth control or hormone replacement.  She denies any history of greater than 3 miscarriages.  She denies any personal family history of blood clots or clotting diseases.  She denies any surgeries, traumas or infections.  She denies any history of stroke or heart attack.  She denies any history of Crohn's disease, ulcerative colitis, COPD or asthma.  She denies any history of cancer.  She denies any varicosities to her lower extremities.  She denies any recent fevers, chills or changes in her health.  Summary of Previous Visit: Patient was seen by Dr. Lowery on 05/15/2024.  At this visit, patient complained of upper back and neck pain due to her enlarged breasts.  On exam, her STN on the right was 34 cm and her STN on the left was 34 cm.  Her BMI was 34.9 kg/m.  Her preoperative bra size is a DD/DDD cup.  The estimated amount of breast tissue to be removed at the time of surgery was 650-700 g bilaterally.  Patient was found to be a good candidate for bilateral breast reduction.  Estimated excess breast tissue to be removed at time of surgery: 650-700 grams  Job: Does not work at this time  PMH  Significant for: GERD, anxiety, bipolar 2, PTSD, macromastia  Patient does reports she wears a CPAP at night.  Discussed with her that she may be at increased risk of complications in the perioperative period.  She expressed understanding.   Past Medical History: Allergies: Allergies  Allergen Reactions   Amlodipine  Swelling    Lower extremity edema.   Hydrochlorothiazide  Other (See Comments)    Cough   Lamotrigine Hives   Chlorthalidone  Rash and Other (See Comments)    Cramping    Current Medications:  Current Outpatient Medications:    ARIPiprazole  (ABILIFY ) 15 MG tablet, Take 1 tablet (15 mg total) by mouth daily., Disp: 30 tablet, Rfl: 2   cephALEXin  (KEFLEX ) 500 MG capsule, Take 1 capsule (500 mg total) by mouth 4 (four) times daily for 3 days., Disp: 12 capsule, Rfl: 0   Cholecalciferol  (VITAMIN D ) 50 MCG (2000 UT) tablet, Take 1 tablet (2,000 Units total) by mouth daily., Disp: 100 tablet, Rfl: 1   cyclobenzaprine  (FLEXERIL ) 10 MG tablet, Take 1 tablet (10 mg total) by mouth every 12 (twelve) hours as needed for spasms, Disp: 60 tablet, Rfl: 1   FLUoxetine  (PROZAC ) 20 MG capsule, Take 1 capsule (20 mg total) by mouth daily., Disp: 30 capsule, Rfl: 2   furosemide  (LASIX ) 20 MG tablet, TAKE 1 TABLET BY MOUTH ON MONDAY, WEDNESDAY AND FRIDAY, Disp:  36 tablet, Rfl: 0   gabapentin (NEURONTIN) 300 MG capsule, Take 300 mg by mouth 3 (three) times daily., Disp: , Rfl:    HYDROcodone -acetaminophen  (NORCO/VICODIN) 5-325 MG tablet, Take 1 tablet by mouth every 8 (eight) hours as needed., Disp: , Rfl:    HYDROcodone -acetaminophen  (NORCO/VICODIN) 5-325 MG tablet, Take 1 tablet by mouth every 8 (eight) hours as needed., Disp: 60 tablet, Rfl: 0   hydrOXYzine  (ATARAX ) 10 MG tablet, Take 1 tablet (10 mg total) by mouth 3 (three) times daily as needed., Disp: 75 tablet, Rfl: 1   meloxicam  (MOBIC ) 7.5 MG tablet, Take 1 tablet (7.5 mg total) by mouth 2 (two) times daily as needed., Disp: 60 tablet,  Rfl: 1   NIFEdipine  (PROCARDIA -XL/NIFEDICAL-XL) 30 MG 24 hr tablet, Take 1 tablet by mouth daily, Disp: 90 tablet, Rfl: 0   olopatadine  (PATANOL) 0.1 % ophthalmic solution, Place 1 drop into both eyes 2 (two) times daily as needed for allergies (as needed for allergy symptoms)., Disp: 5 mL, Rfl: 12   omeprazole  (PRILOSEC) 20 MG capsule, Take 1 capsule (20 mg total) by mouth 2 (two) times daily before a meal., Disp: 180 capsule, Rfl: 0   ondansetron  (ZOFRAN ) 4 MG tablet, Take 1 tablet (4 mg total) by mouth every 8 (eight) hours as needed for up to 20 doses for nausea or vomiting., Disp: 20 tablet, Rfl: 0   prazosin  (MINIPRESS ) 2 MG capsule, Take 1 capsule (2 mg total) by mouth at bedtime., Disp: 30 capsule, Rfl: 2   Semaglutide -Weight Management (WEGOVY ) 2.4 MG/0.75ML SOAJ, Inject 2.4 mg into the skin once a week., Disp: 9 mL, Rfl: 1   traZODone  (DESYREL ) 150 MG tablet, Take 1 tablet (150 mg total) by mouth at bedtime as needed for sleep., Disp: 30 tablet, Rfl: 1   valACYclovir  (VALTREX ) 1000 MG tablet, Take 1 tablet (1,000 mg total) by mouth daily., Disp: 90 tablet, Rfl: 1   valsartan  (DIOVAN ) 160 MG tablet, Take 1 tablet (160 mg total) by mouth daily., Disp: 90 tablet, Rfl: 1  Past Medical Problems: Past Medical History:  Diagnosis Date   Anxiety    Arthritis    Bipolar 1 disorder (HCC)    Depression    GERD (gastroesophageal reflux disease)    Hypertension    PTSD (post-traumatic stress disorder)     Past Surgical History: Past Surgical History:  Procedure Laterality Date   ABDOMINAL HYSTERECTOMY     ANTERIOR CERVICAL DECOMP/DISCECTOMY FUSION N/A 06/06/2021   Procedure: C4-5, C5-6 ANTERIOR CERVICAL DISCECTOMY FUSION, ALLOGRAFT, PLATE;  Surgeon: Barbarann Oneil BROCKS, MD;  Location: [facility] OR;  Service: Orthopedics;  Laterality: N/A;   CHOLECYSTECTOMY     DILATION AND CURETTAGE OF UTERUS     FOOT SURGERY Bilateral    two pins in each foot   JOINT REPLACEMENT Left 2018   partial knee  replacement   KNEE SURGERY Left 2018   partial knee replacement   NECK SURGERY  06/06/2021   C4-5, C5-6 ANTERIOR CERVICAL DISCECTOMY FUSION, ALLOGRAFT, PLATE    Social History: Social History   Socioeconomic History   Marital status: Single    Spouse name: Not on file   Number of children: 1   Years of education: Not on file   Highest education level: Some college, no degree  Occupational History   Not on file  Tobacco Use   Smoking status: Former    Current packs/day: 0.25    Types: Cigarettes   Smokeless tobacco: Never  Vaping Use   Vaping  status: Never Used  Substance and Sexual Activity   Alcohol use: Yes    Alcohol/week: 2.0 standard drinks of alcohol    Types: 1 Glasses of wine, 1 Cans of beer per week   Drug use: No   Sexual activity: Not Currently    Birth control/protection: Surgical  Other Topics Concern   Not on file  Social History Narrative   Not on file   Social Drivers of Health   Financial Resource Strain: High Risk (04/20/2024)   Overall Financial Resource Strain (CARDIA)    Difficulty of Paying Living Expenses: Very hard  Food Insecurity: Food Insecurity Present (04/20/2024)   Hunger Vital Sign    Worried About Running Out of Food in the Last Year: Sometimes true    Ran Out of Food in the Last Year: Often true  Transportation Needs: No Transportation Needs (04/20/2024)   PRAPARE - Administrator, Civil Service (Medical): No    Lack of Transportation (Non-Medical): No  Physical Activity: Insufficiently Active (04/20/2024)   Exercise Vital Sign    Days of Exercise per Week: 3 days    Minutes of Exercise per Session: 10 min  Stress: Stress Concern Present (04/20/2024)   [employer] of Occupational Health - Occupational Stress Questionnaire    Feeling of Stress: Very much  Social Connections: Socially Isolated (04/20/2024)   Social Connection and Isolation Panel    Frequency of Communication with Friends and Family: More than three  times a week    Frequency of Social Gatherings with Friends and Family: Three times a week    Attends Religious Services: Never    Active Member of Clubs or Organizations: No    Attends Banker Meetings: Not on file    Marital Status: Never married  Intimate Partner Violence: Not At Risk (11/26/2023)   Humiliation, Afraid, Rape, and Kick questionnaire    Fear of Current or Ex-Partner: No    Emotionally Abused: No    Physically Abused: No    Sexually Abused: No    Family History: Family History  Problem Relation Age of Onset   Hypertension Mother    Cancer Mother        breast   Anxiety disorder Mother    Hypertension Father    Schizophrenia Father    Bipolar disorder Father    Anxiety disorder Father    Parkinson's disease Father    Colon cancer Neg Hx    Colon polyps Neg Hx    Esophageal cancer Neg Hx    Rectal cancer Neg Hx    Stomach cancer Neg Hx     Review of Systems: Denies any recent fevers, chills or changes in her health  Physical Exam: Vital Signs BP 115/76 (BP Location: Left Arm, Patient Position: Sitting, Cuff Size: Normal)   Pulse 98   Wt 212 lb (96.2 kg)   SpO2 93%   BMI 35.28 kg/m   Physical Exam  Constitutional:      General: Not in acute distress.    Appearance: Normal appearance. Not ill-appearing.  HENT:     Head: Normocephalic and atraumatic.  Neck:     Musculoskeletal: Normal range of motion.  Cardiovascular:     Rate and Rhythm: Normal rate Pulmonary:     Effort: Pulmonary effort is normal. No respiratory distress.  Musculoskeletal: Normal range of motion.  Skin:    General: Skin is warm and dry.     Findings: No erythema or rash.  Neurological:  Mental Status: Alert and oriented to person, place, and time. Mental status is at baseline.  Psychiatric:        Mood and Affect: Mood normal.        Behavior: Behavior normal.    Assessment/Plan: The patient is scheduled for bilateral breast reduction with Dr.  Lowery.  Risks, benefits, and alternatives of procedure discussed, questions answered and consent obtained.    Smoking Status: Non-smoker; Counseling Given?  N/A Last Mammogram: 01/26/2024; Results: BI-RADS Category 1 negative  Caprini Score: 4; Risk Factors include: Age, BMI > 25, and length of planned surgery. Recommendation for mechanical prophylaxis. Encourage early ambulation.   Pictures obtained: @consult   Post-op Rx sent to pharmacy: Zofran , Keflex  -patient currently receives 5-325 mg Norco from pain management provider.  Recommended that she follow-up with them in regards to possible further pain pills for postop pain.  She expressed understanding.  I recommended that the patient hold her Lasix  and valsartan  the day of surgery.  Discussed with her to hold her Mobic  and supplements 1 week prior to surgery.  Discussed with her to hold her Wegovy  2 doses prior to surgery.  Patient expressed understanding.  Patient was provided with the breast reduction and General Surgical Risk consent document and Pain Medication Agreement prior to their appointment.  They had adequate time to read through the risk consent documents and Pain Medication Agreement. We also discussed them in person together during this preop appointment. All of their questions were answered to their satisfaction.  Recommended calling if they have any further questions.  Risk consent form and Pain Medication Agreement to be scanned into patient's chart.  The risk that can be encountered with breast reduction were discussed and include the following but not limited to these:  Breast asymmetry, fluid accumulation, firmness of the breast, inability to breast feed, loss of nipple or areola, skin loss, decrease or no nipple sensation, fat necrosis of the breast tissue, bleeding, infection, healing delay.  There are risks of anesthesia, changes to skin sensation and injury to nerves or blood vessels.  The muscle can be temporarily or  permanently injured.  You may have an allergic reaction to tape, suture, glue, blood products which can result in skin discoloration, swelling, pain, skin lesions, poor healing.  Any of these can lead to the need for revisonal surgery or stage procedures.  A reduction has potential to interfere with diagnostic procedures.  Nipple or breast piercing can increase risks of infection.  This procedure is best done when the breast is fully developed.  Changes in the breast will continue to occur over time.  Pregnancy can alter the outcomes of previous breast reduction surgery, weight gain and weigh loss can also effect the long term appearance.   The consent was obtained with risks and complications reviewed which included bleeding, pain, scar, infection and the risk of anesthesia.  The patients questions were answered to the patients expressed satisfaction.   Patient has psych history including PTSD and bipolar disorder.  She states that she has a therapist that she follows up closely with and has informed her therapist that she will be undergoing surgery.  I discussed with the patient that surgery can be emotionally and mentally taxing and may be difficult.  Recommended that she have close left follow-up with her therapist throughout the perioperative period.  She expressed understanding.  Patient states that she has notified all of her medical providers of her upcoming surgery.    Electronically signed by: Estefana BRAVO  Andris, PA-C 07/13/2024 3:20 PM

## 2024-07-17 ENCOUNTER — Telehealth: Payer: Self-pay | Admitting: Student

## 2024-07-17 ENCOUNTER — Encounter: Payer: Self-pay | Admitting: Internal Medicine

## 2024-07-17 NOTE — Telephone Encounter (Signed)
 Pt just called and has questions concerning her medications that were prescribed after preop, and she also has questions concerning surgery length, she wants to know how long she will be there. Clinical please advise pt

## 2024-07-18 ENCOUNTER — Other Ambulatory Visit (HOSPITAL_COMMUNITY): Payer: Self-pay

## 2024-07-18 MED ORDER — CYCLOBENZAPRINE HCL 10 MG PO TABS
10.0000 mg | ORAL_TABLET | Freq: Two times a day (BID) | ORAL | 1 refills | Status: DC
  Filled 2024-07-18: qty 60, 30d supply, fill #0
  Filled 2024-08-24: qty 60, 30d supply, fill #1

## 2024-07-18 MED ORDER — HYDROCODONE-ACETAMINOPHEN 5-325 MG PO TABS
1.0000 | ORAL_TABLET | Freq: Three times a day (TID) | ORAL | 0 refills | Status: DC
  Filled 2024-08-02 – 2024-08-04 (×2): qty 15, 5d supply, fill #0

## 2024-07-19 ENCOUNTER — Telehealth: Payer: Self-pay

## 2024-07-19 ENCOUNTER — Telehealth: Payer: Self-pay | Admitting: Plastic Surgery

## 2024-07-19 ENCOUNTER — Other Ambulatory Visit: Payer: Self-pay

## 2024-07-19 ENCOUNTER — Encounter (HOSPITAL_BASED_OUTPATIENT_CLINIC_OR_DEPARTMENT_OTHER): Payer: Self-pay | Admitting: Plastic Surgery

## 2024-07-19 NOTE — Telephone Encounter (Signed)
 Pt just called and has questions concerning her medications that were prescribed after preop, and she also has questions concerning surgery length, she wants to know how long she will be there. Clinical please advise pt

## 2024-07-19 NOTE — Telephone Encounter (Signed)
 Fax sent to [facility] FNP-C) requesting recommendations for post op pain management. Confirmation of receipt.

## 2024-07-19 NOTE — Telephone Encounter (Signed)
 I called the patient and answered all of her questions to her satisfaction

## 2024-07-20 ENCOUNTER — Other Ambulatory Visit: Payer: Self-pay

## 2024-07-24 ENCOUNTER — Encounter (HOSPITAL_BASED_OUTPATIENT_CLINIC_OR_DEPARTMENT_OTHER)
Admission: RE | Admit: 2024-07-24 | Discharge: 2024-07-24 | Disposition: A | Discharge status: Home / Self Care | Payer: MEDICAID | Source: Ambulatory Visit | Attending: Plastic Surgery | Admitting: Plastic Surgery

## 2024-07-24 DIAGNOSIS — Z01818 Encounter for other preprocedural examination: Secondary | ICD-10-CM | POA: Diagnosis present

## 2024-07-24 DIAGNOSIS — Z01810 Encounter for preprocedural cardiovascular examination: Secondary | ICD-10-CM | POA: Diagnosis not present

## 2024-07-26 ENCOUNTER — Encounter (HOSPITAL_BASED_OUTPATIENT_CLINIC_OR_DEPARTMENT_OTHER): Payer: Self-pay | Admitting: Plastic Surgery

## 2024-07-26 ENCOUNTER — Ambulatory Visit (HOSPITAL_BASED_OUTPATIENT_CLINIC_OR_DEPARTMENT_OTHER): Payer: MEDICAID | Admitting: Certified Registered Nurse Anesthetist

## 2024-07-26 ENCOUNTER — Other Ambulatory Visit: Payer: Self-pay

## 2024-07-26 ENCOUNTER — Ambulatory Visit (HOSPITAL_BASED_OUTPATIENT_CLINIC_OR_DEPARTMENT_OTHER)
Admission: RE | Admit: 2024-07-26 | Discharge: 2024-07-26 | Disposition: A | Discharge status: Home / Self Care | Payer: MEDICAID | Attending: Plastic Surgery | Admitting: Plastic Surgery

## 2024-07-26 ENCOUNTER — Encounter (HOSPITAL_BASED_OUTPATIENT_CLINIC_OR_DEPARTMENT_OTHER)
Admission: RE | Disposition: A | Discharge status: Home / Self Care | Payer: Self-pay | Source: Home / Self Care | Attending: Plastic Surgery

## 2024-07-26 DIAGNOSIS — Z803 Family history of malignant neoplasm of breast: Secondary | ICD-10-CM | POA: Diagnosis not present

## 2024-07-26 DIAGNOSIS — F419 Anxiety disorder, unspecified: Secondary | ICD-10-CM | POA: Diagnosis not present

## 2024-07-26 DIAGNOSIS — M542 Cervicalgia: Secondary | ICD-10-CM | POA: Diagnosis not present

## 2024-07-26 DIAGNOSIS — Z87891 Personal history of nicotine dependence: Secondary | ICD-10-CM | POA: Insufficient documentation

## 2024-07-26 DIAGNOSIS — Z5986 Financial insecurity: Secondary | ICD-10-CM | POA: Diagnosis not present

## 2024-07-26 DIAGNOSIS — N62 Hypertrophy of breast: Secondary | ICD-10-CM | POA: Diagnosis present

## 2024-07-26 DIAGNOSIS — Z79899 Other long term (current) drug therapy: Secondary | ICD-10-CM | POA: Insufficient documentation

## 2024-07-26 DIAGNOSIS — Z604 Social exclusion and rejection: Secondary | ICD-10-CM | POA: Diagnosis not present

## 2024-07-26 DIAGNOSIS — F4310 Post-traumatic stress disorder, unspecified: Secondary | ICD-10-CM | POA: Insufficient documentation

## 2024-07-26 DIAGNOSIS — M549 Dorsalgia, unspecified: Secondary | ICD-10-CM | POA: Diagnosis not present

## 2024-07-26 DIAGNOSIS — F3181 Bipolar II disorder: Secondary | ICD-10-CM | POA: Diagnosis not present

## 2024-07-26 DIAGNOSIS — G4730 Sleep apnea, unspecified: Secondary | ICD-10-CM | POA: Diagnosis not present

## 2024-07-26 DIAGNOSIS — K219 Gastro-esophageal reflux disease without esophagitis: Secondary | ICD-10-CM | POA: Insufficient documentation

## 2024-07-26 DIAGNOSIS — I10 Essential (primary) hypertension: Secondary | ICD-10-CM | POA: Diagnosis not present

## 2024-07-26 HISTORY — PX: BREAST REDUCTION SURGERY: SHX8

## 2024-07-26 HISTORY — DX: Sleep apnea, unspecified: G47.30

## 2024-07-26 SURGERY — BREAST REDUCTION WITH LIPOSUCTION
Anesthesia: General | Site: Breast | Laterality: Bilateral

## 2024-07-26 MED ORDER — DEXAMETHASONE SODIUM PHOSPHATE 10 MG/ML IJ SOLN
INTRAMUSCULAR | Status: DC | PRN
  Administered 2024-07-26: 10 mg via INTRAVENOUS

## 2024-07-26 MED ORDER — HYDROMORPHONE HCL 1 MG/ML IJ SOLN
0.2500 mg | INTRAMUSCULAR | Status: DC | PRN
  Administered 2024-07-26 (×2): 0.25 mg via INTRAVENOUS

## 2024-07-26 MED ORDER — ONDANSETRON HCL 4 MG/2ML IJ SOLN
INTRAMUSCULAR | Status: DC | PRN
  Administered 2024-07-26: 4 mg via INTRAVENOUS

## 2024-07-26 MED ORDER — ACETAMINOPHEN 325 MG PO TABS: 650.0000 mg | ORAL_TABLET | ORAL | Status: DC | PRN

## 2024-07-26 MED ORDER — KETAMINE HCL 50 MG/5ML IJ SOSY
PREFILLED_SYRINGE | INTRAMUSCULAR | Status: AC
Start: 2024-07-26 — End: 2024-07-26
  Filled 2024-07-26: qty 5

## 2024-07-26 MED ORDER — CEFAZOLIN SODIUM-DEXTROSE 2-4 GM/100ML-% IV SOLN
INTRAVENOUS | Status: AC
  Filled 2024-07-26: qty 100

## 2024-07-26 MED ORDER — FENTANYL CITRATE (PF) 100 MCG/2ML IJ SOLN
INTRAMUSCULAR | Status: DC | PRN
  Administered 2024-07-26: 50 ug via INTRAVENOUS
  Administered 2024-07-26: 100 ug via INTRAVENOUS
  Administered 2024-07-26 (×2): 50 ug via INTRAVENOUS

## 2024-07-26 MED ORDER — OXYCODONE HCL 5 MG PO TABS: 5.0000 mg | ORAL_TABLET | ORAL | Status: DC | PRN

## 2024-07-26 MED ORDER — OXYCODONE HCL 5 MG PO TABS: 5.0000 mg | ORAL_TABLET | Freq: Once | ORAL | Status: DC | PRN

## 2024-07-26 MED ORDER — TRANEXAMIC ACID-NACL 1000-0.7 MG/100ML-% IV SOLN
INTRAVENOUS | Status: DC | PRN
Start: 2024-07-26 — End: 2024-07-26
  Administered 2024-07-26: 1000 mg via INTRAVENOUS

## 2024-07-26 MED ORDER — FENTANYL CITRATE (PF) 100 MCG/2ML IJ SOLN
INTRAMUSCULAR | Status: AC
  Filled 2024-07-26: qty 2

## 2024-07-26 MED ORDER — SODIUM CHLORIDE 0.9% FLUSH: 3.0000 mL | Freq: Two times a day (BID) | INTRAVENOUS | Status: DC

## 2024-07-26 MED ORDER — PROPOFOL 10 MG/ML IV BOLUS
INTRAVENOUS | Status: DC | PRN
  Administered 2024-07-26: 200 ug via INTRAVENOUS

## 2024-07-26 MED ORDER — MIDAZOLAM HCL 2 MG/2ML IJ SOLN
INTRAMUSCULAR | Status: DC | PRN
  Administered 2024-07-26: 2 mg via INTRAVENOUS

## 2024-07-26 MED ORDER — CEFAZOLIN SODIUM-DEXTROSE 2-4 GM/100ML-% IV SOLN: 2.0000 g | INTRAVENOUS | Status: DC

## 2024-07-26 MED ORDER — FENTANYL CITRATE (PF) 100 MCG/2ML IJ SOLN: 25.0000 ug | INTRAMUSCULAR | Status: DC | PRN

## 2024-07-26 MED ORDER — VASHE WOUND IRRIGATION OPTIME
TOPICAL | Status: DC | PRN
  Administered 2024-07-26: 34 [oz_av]

## 2024-07-26 MED ORDER — KETAMINE HCL 50 MG/5ML IJ SOSY
PREFILLED_SYRINGE | INTRAMUSCULAR | Status: DC | PRN
  Administered 2024-07-26: 50 mg via INTRAVENOUS

## 2024-07-26 MED ORDER — SODIUM CHLORIDE 0.9 % IV SOLN
12.5000 mg | INTRAVENOUS | Status: DC | PRN
  Filled 2024-07-26: qty 0.5

## 2024-07-26 MED ORDER — CHLORHEXIDINE GLUCONATE CLOTH 2 % EX PADS: 6.0000 | MEDICATED_PAD | Freq: Once | CUTANEOUS | Status: DC

## 2024-07-26 MED ORDER — LIDOCAINE-EPINEPHRINE 1 %-1:100000 IJ SOLN
INTRAMUSCULAR | Status: DC | PRN
  Administered 2024-07-26 (×2): 25 mL via INTRAMUSCULAR

## 2024-07-26 MED ORDER — CEFAZOLIN SODIUM 1 G IJ SOLR
INTRAMUSCULAR | Status: AC
  Filled 2024-07-26: qty 10

## 2024-07-26 MED ORDER — LIDOCAINE HCL 1 % IJ SOLN
INTRAVENOUS | Status: DC | PRN
  Administered 2024-07-26: 1000 mL

## 2024-07-26 MED ORDER — SODIUM CHLORIDE 0.9% FLUSH: 3.0000 mL | INTRAVENOUS | Status: DC | PRN

## 2024-07-26 MED ORDER — LIDOCAINE 2% (20 MG/ML) 5 ML SYRINGE
INTRAMUSCULAR | Status: DC | PRN
  Administered 2024-07-26: 80 mg via INTRAVENOUS

## 2024-07-26 MED ORDER — CEFAZOLIN SODIUM-DEXTROSE 2-3 GM-%(50ML) IV SOLR
INTRAVENOUS | Status: DC | PRN
  Administered 2024-07-26: 2 g via INTRAVENOUS

## 2024-07-26 MED ORDER — HYDROMORPHONE HCL 1 MG/ML IJ SOLN
INTRAMUSCULAR | Status: AC
  Filled 2024-07-26: qty 0.5

## 2024-07-26 MED ORDER — MEPERIDINE HCL 25 MG/ML IJ SOLN: 6.2500 mg | INTRAMUSCULAR | Status: DC | PRN

## 2024-07-26 MED ORDER — LACTATED RINGERS IV SOLN: INTRAVENOUS | Status: DC

## 2024-07-26 MED ORDER — PHENYLEPHRINE 80 MCG/ML (10ML) SYRINGE FOR IV PUSH (FOR BLOOD PRESSURE SUPPORT)
PREFILLED_SYRINGE | INTRAVENOUS | Status: DC | PRN
  Administered 2024-07-26: 160 ug via INTRAVENOUS

## 2024-07-26 MED ORDER — MIDAZOLAM HCL 2 MG/2ML IJ SOLN
INTRAMUSCULAR | Status: AC
  Filled 2024-07-26: qty 2

## 2024-07-26 MED ORDER — PROPOFOL 500 MG/50ML IV EMUL
INTRAVENOUS | Status: DC | PRN
  Administered 2024-07-26: 50 ug/kg/min via INTRAVENOUS

## 2024-07-26 MED ORDER — SODIUM CHLORIDE 0.9 % IV SOLN: 250.0000 mL | INTRAVENOUS | Status: DC | PRN

## 2024-07-26 MED ORDER — DEXMEDETOMIDINE HCL IN NACL 80 MCG/20ML IV SOLN
INTRAVENOUS | Status: DC | PRN
  Administered 2024-07-26 (×5): 4 ug via INTRAVENOUS

## 2024-07-26 MED ORDER — OXYCODONE HCL 5 MG/5ML PO SOLN: 5.0000 mg | Freq: Once | ORAL | Status: DC | PRN

## 2024-07-26 MED ORDER — BUPIVACAINE LIPOSOME 1.3 % IJ SUSP
INTRAMUSCULAR | Status: DC | PRN
  Administered 2024-07-26 (×2): 20 mL

## 2024-07-26 MED ORDER — ACETAMINOPHEN 325 MG RE SUPP: 650.0000 mg | RECTAL | Status: DC | PRN

## 2024-07-26 MED ORDER — DEXMEDETOMIDINE HCL IN NACL 80 MCG/20ML IV SOLN
INTRAVENOUS | Status: AC
  Filled 2024-07-26: qty 20

## 2024-07-26 MED ORDER — AMISULPRIDE (ANTIEMETIC) 5 MG/2ML IV SOLN: 10.0000 mg | Freq: Once | INTRAVENOUS | Status: DC | PRN

## 2024-07-26 SURGICAL SUPPLY — 66 items
BINDER BREAST LRG (GAUZE/BANDAGES/DRESSINGS) IMPLANT
BINDER BREAST MEDIUM (GAUZE/BANDAGES/DRESSINGS) IMPLANT
BINDER BREAST XLRG (GAUZE/BANDAGES/DRESSINGS) IMPLANT
BINDER BREAST XXLRG (GAUZE/BANDAGES/DRESSINGS) IMPLANT
BIOPATCH RED 1 DISK 7.0 (GAUZE/BANDAGES/DRESSINGS) IMPLANT
BLADE HEX COATED 2.75 (ELECTRODE) IMPLANT
BLADE KNIFE PERSONA 10 (BLADE) ×2 IMPLANT
BLADE SURG 15 STRL LF DISP TIS (BLADE) ×1 IMPLANT
CANISTER SUCT 1200ML W/VALVE (MISCELLANEOUS) ×1 IMPLANT
CLEANSER WND VASHE 34 (WOUND CARE) ×1 IMPLANT
CLSR STERI-STRIP ANTIMIC 1/2X4 (GAUZE/BANDAGES/DRESSINGS) IMPLANT
COTTONBALL LRG STERILE PKG (GAUZE/BANDAGES/DRESSINGS) IMPLANT
COVER BACK TABLE 60X90IN (DRAPES) ×1 IMPLANT
COVER MAYO STAND STRL (DRAPES) ×1 IMPLANT
DERMABOND ADVANCED .7 DNX12 (GAUZE/BANDAGES/DRESSINGS) ×2 IMPLANT
DRAIN CHANNEL 15F RND FF W/TCR (WOUND CARE) IMPLANT
DRAIN CHANNEL 19F RND (DRAIN) IMPLANT
DRAPE LAPAROSCOPIC ABDOMINAL (DRAPES) ×1 IMPLANT
DRAPE UTILITY XL STRL (DRAPES) ×1 IMPLANT
DRSG MEPILEX POST OP 4X8 (GAUZE/BANDAGES/DRESSINGS) ×2 IMPLANT
DRSG TEGADERM 4X4.75 (GAUZE/BANDAGES/DRESSINGS) IMPLANT
DRSG TELFA 3X8 NADH STRL (GAUZE/BANDAGES/DRESSINGS) IMPLANT
ELECTRODE BLDE 4.0 EZ CLN MEGD (MISCELLANEOUS) ×1 IMPLANT
ELECTRODE REM PT RTRN 9FT ADLT (ELECTROSURGICAL) ×1 IMPLANT
EVACUATOR SILICONE 100CC (DRAIN) IMPLANT
GAUZE PAD ABD 8X10 STRL (GAUZE/BANDAGES/DRESSINGS) ×2 IMPLANT
GAUZE XEROFORM 5X9 LF (GAUZE/BANDAGES/DRESSINGS) IMPLANT
GLOVE BIO SURGEON STRL SZ 6.5 (GLOVE) ×2 IMPLANT
GLOVE BIO SURGEON STRL SZ7.5 (GLOVE) ×1 IMPLANT
GLOVE BIOGEL PI IND STRL 7.0 (GLOVE) IMPLANT
GLOVE BIOGEL PI IND STRL 8 (GLOVE) IMPLANT
GOWN STRL REUS W/ TWL LRG LVL3 (GOWN DISPOSABLE) ×2 IMPLANT
GOWN STRL REUS W/ TWL XL LVL3 (GOWN DISPOSABLE) IMPLANT
LINER CANISTER 1000CC FLEX (MISCELLANEOUS) ×1 IMPLANT
NDL FILTER BLUNT 18X1 1/2 (NEEDLE) IMPLANT
NDL HYPO 25X1 1.5 SAFETY (NEEDLE) ×2 IMPLANT
NEEDLE FILTER BLUNT 18X1 1/2 (NEEDLE) IMPLANT
NEEDLE HYPO 25X1 1.5 SAFETY (NEEDLE) ×2 IMPLANT
NS IRRIG 1000ML POUR BTL (IV SOLUTION) IMPLANT
PACK BASIN DAY SURGERY FS (CUSTOM PROCEDURE TRAY) ×1 IMPLANT
PAD ALCOHOL SWAB (MISCELLANEOUS) IMPLANT
PAD FOAM SILICONE BACKED (GAUZE/BANDAGES/DRESSINGS) IMPLANT
PENCIL SMOKE EVACUATOR (MISCELLANEOUS) ×1 IMPLANT
PIN SAFETY STERILE (MISCELLANEOUS) IMPLANT
POWDER MYRIAD MORCLLS FINE 500 (Miscellaneous) IMPLANT
SLEEVE SCD COMPRESS KNEE MED (STOCKING) ×1 IMPLANT
SPIKE FLUID TRANSFER (MISCELLANEOUS) IMPLANT
SPONGE T-LAP 18X18 ~~LOC~~+RFID (SPONGE) ×2 IMPLANT
STRIP SUTURE WOUND CLOSURE 1/2 (MISCELLANEOUS) ×4 IMPLANT
SUT MNCRL AB 4-0 PS2 18 (SUTURE) ×4 IMPLANT
SUT MON AB 3-0 SH27 (SUTURE) ×4 IMPLANT
SUT MON AB 5-0 PS2 18 (SUTURE) IMPLANT
SUT PDS II 3-0 CT2 27 ABS (SUTURE) ×4 IMPLANT
SUT SILK 3 0 PS 1 (SUTURE) IMPLANT
SUT VIC AB 4-0 PS2 27 (SUTURE) IMPLANT
SYR 50ML LL SCALE MARK (SYRINGE) IMPLANT
SYR BULB IRRIG 60ML STRL (SYRINGE) ×1 IMPLANT
SYR CONTROL 10ML LL (SYRINGE) ×2 IMPLANT
TAPE MEASURE VINYL STERILE (MISCELLANEOUS) IMPLANT
TOWEL GREEN STERILE FF (TOWEL DISPOSABLE) ×3 IMPLANT
TRAY DSU PREP LF (CUSTOM PROCEDURE TRAY) ×1 IMPLANT
TUBE CONNECTING 20X1/4 (TUBING) ×1 IMPLANT
TUBING INFILTRATION IT-10001 (TUBING) IMPLANT
TUBING SET GRADUATE ASPIR 12FT (MISCELLANEOUS) IMPLANT
UNDERPAD 30X36 HEAVY ABSORB (UNDERPADS AND DIAPERS) ×2 IMPLANT
YANKAUER SUCT BULB TIP NO VENT (SUCTIONS) ×1 IMPLANT

## 2024-07-26 NOTE — Anesthesia Procedure Notes (Signed)
 Procedure Name: LMA Insertion Date/Time: 07/26/2024 10:16 AM  Performed by: Leotha Andrez DEL, CRNAPre-anesthesia Checklist: Patient identified, Emergency Drugs available, Suction available, Patient being monitored and Timeout performed Patient Re-evaluated:Patient Re-evaluated prior to induction Oxygen Delivery Method: Circle system utilized Preoxygenation: Pre-oxygenation with 100% oxygen Induction Type: IV induction Ventilation: Mask ventilation without difficulty LMA: LMA with gastric port inserted LMA Size: 4.0 Number of attempts: 1 Placement Confirmation: breath sounds checked- equal and bilateral and positive ETCO2 Tube secured with: Tape Dental Injury: Teeth and Oropharynx as per pre-operative assessment

## 2024-07-26 NOTE — Anesthesia Postprocedure Evaluation (Signed)
 Anesthesia Post Note  Patient: Theresa Cantrell  Procedure(s) Performed: BREAST REDUCTION WITH LIPOSUCTION (Bilateral: Breast)     Patient location during evaluation: PACU Anesthesia Type: General Level of consciousness: awake and alert Pain management: pain level controlled Vital Signs Assessment: post-procedure vital signs reviewed and stable Respiratory status: spontaneous breathing, nonlabored ventilation and respiratory function stable Cardiovascular status: blood pressure returned to baseline and stable Postop Assessment: no apparent nausea or vomiting Anesthetic complications: no   No notable events documented.  Last Vitals:  Vitals:   07/26/24 1330 07/26/24 1345  BP: (!) 128/90 133/87  Pulse: 98 (!) 106  Resp: 15 15  Temp:  36.5 C  SpO2: 95% 98%    Last Pain:  Vitals:   07/26/24 1345  TempSrc:   PainSc: 4                  Butler Levander Pinal

## 2024-07-26 NOTE — Interval H&P Note (Signed)
 History and Physical Interval Note:  07/26/2024 9:33 AM  Theresa Cantrell  has presented today for surgery, with the diagnosis of hypertrophy of breast.  The various methods of treatment have been discussed with the patient and family. After consideration of risks, benefits and other options for treatment, the patient has consented to  Procedure(s): BREAST REDUCTION WITH LIPOSUCTION (Bilateral) as a surgical intervention.  The patient's history has been reviewed, patient examined, no change in status, stable for surgery.  I have reviewed the patient's chart and labs.  Questions were answered to the patient's satisfaction.     [name] Ezrah Dembeck

## 2024-07-26 NOTE — Anesthesia Preprocedure Evaluation (Deleted)
 Anesthesia Evaluation  Patient identified by MRN, date of birth, ID band Patient awake    Reviewed: Allergy & Precautions, H&P , NPO status , Patient's Chart, lab work & pertinent test results  Airway Mallampati: II  TM Distance: >3 FB Neck ROM: Full    Dental no notable dental hx. (+) Teeth Intact, Dental Advisory Given,    Pulmonary sleep apnea , Patient did not abstain from smoking., former smoker   Pulmonary exam normal breath sounds clear to auscultation       Cardiovascular hypertension, Pt. on medications negative cardio ROS Normal cardiovascular exam Rhythm:Regular Rate:Normal  05/28/21 EKG NSR R 79 w NSST changes   Neuro/Psych   Anxiety Depression Bipolar Disorder   PTSDnegative neurological ROS  negative psych ROS   GI/Hepatic negative GI ROS, Neg liver ROS,GERD  ,,(+) Hepatitis -, CLab Results      Component                Value               Date                      ALT                      10                  05/28/2021                AST                      21                  05/28/2021                ALKPHOS                  87                  05/28/2021                BILITOT                  0.3                 05/28/2021              Endo/Other  negative endocrine ROS    Renal/GU negative Renal ROSLab Results      Component                Value               Date                      CREATININE               1.03 (H)            05/28/2021                BUN                      24 (H)              05/28/2021                NA  136                 05/28/2021                K                        3.6                 05/28/2021                CL                       104                 05/28/2021                CO2                      28                  05/28/2021             negative genitourinary   Musculoskeletal negative musculoskeletal ROS (+) Arthritis ,     Abdominal  (+) + obese (BMI 30.47)  Peds negative pediatric ROS (+)  Hematology negative hematology ROS (+) Lab Results      Component                Value               Date                      WBC                      6.9                 05/28/2021                HGB                      13.2                05/28/2021                HCT                      40.8                05/28/2021                MCV                      92.9                05/28/2021                PLT                      240                 05/28/2021              Anesthesia Other Findings [id number]  Reproductive/Obstetrics negative OB ROS  Anesthesia Physical Anesthesia Plan  ASA: 3  Anesthesia Plan: General   Post-op Pain Management:    Induction: Intravenous  PONV Risk Score and Plan: 3 and Treatment may vary due to age or medical condition, Midazolam , Dexamethasone  and Ondansetron   Airway Management Planned: LMA and Oral ETT  Additional Equipment: None  Intra-op Plan:   Post-operative Plan: Extubation in OR  Informed Consent: I have reviewed the patients History and Physical, chart, labs and discussed the procedure including the risks, benefits and alternatives for the proposed anesthesia with the patient or authorized representative who has indicated his/her understanding and acceptance.     Dental advisory given  Plan Discussed with: CRNA and Anesthesiologist  Anesthesia Plan Comments:          Anesthesia Quick Evaluation

## 2024-07-26 NOTE — Discharge Instructions (Signed)
 INSTRUCTIONS FOR AFTER BREAST SURGERY   You will likely have some questions about what to expect following your operation.  The following information will help you and your family understand what to expect when you are discharged from the hospital.  It is important to follow these guidelines to help ensure a smooth recovery and reduce complication.  Postoperative instructions include information on: diet, wound care, medications and physical activity.  AFTER SURGERY Expect to go home after the procedure.  In some cases, you may need to spend one night in the hospital for observation.  DIET Breast surgery does not require a specific diet.  However, the healthier you eat the better your body will heal. It is important to increasing your protein intake.  This means limiting the foods with sugar and carbohydrates.  Focus on vegetables and some meat.  If you have liposuction during your procedure be sure to drink water.  If your urine is bright yellow, then it is concentrated, and you need to drink more water.  As a general rule after surgery, you should have 8 ounces of water every hour while awake.  If you find you are persistently nauseated or unable to take in liquids let us  know.  NO TOBACCO USE or EXPOSURE.  This will slow your healing process and lead to a wound.  WOUND CARE Leave the binder on for 3 days . Use fragrance free soap like Dial, Dove or [place].   After 3 days you can remove the binder to shower. Once dry apply binder or sports bra. If you have liposuction you will have a soft and spongy dressing (Lipofoam) that helps prevent creases in your skin.  Remove before you shower and then replace it.  It is also available on [employer]. If you have steri-strips / tape directly attached to your skin leave them in place. It is OK to get these wet.   No baths, pools or hot tubs for four weeks. We close your incision to leave the smallest and best-looking scar. No ointment or creams on your incisions  for four weeks.  No Neosporin (Too many skin reactions).  A few weeks after surgery you can use Mederma and start massaging the scar. We ask you to wear your binder or sports bra for the first 6 weeks around the clock, including while sleeping. This provides added comfort and helps reduce the fluid accumulation at the surgery site. NO Ice or heating pads to the operative site.  You have a very high risk of a BURN before you feel the temperature change.  ACTIVITY No heavy lifting until cleared by the doctor.  This usually means no more than a half-gallon of milk.  It is OK to walk and climb stairs. Moving your legs is very important to decrease your risk of a blood clot.  It will also help keep you from getting deconditioned.  Every 1 to 2 hours get up and walk for 5 minutes. This will help with a quicker recovery back to normal.  Let pain be your guide so you don't do too much.  This time is for you to recover.  You will be more comfortable if you sleep and rest with your head elevated either with a few pillows under you or in a recliner.  No stomach sleeping for a three months.  WORK Everyone returns to work at different times. As a rough guide, most people take at least 1 - 2 weeks off prior to returning to work. If  you need documentation for your job, give the forms to the front staff at the clinic.  DRIVING Arrange for someone to bring you home from the hospital after your surgery.  You may be able to drive a few days after surgery but not while taking any narcotics or valium .  BOWEL MOVEMENTS Constipation can occur after anesthesia and while taking pain medication.  It is important to stay ahead for your comfort.  We recommend taking Milk of Magnesia (2 tablespoons; twice a day) while taking the pain pills.  MEDICATIONS You may be prescribed should start after surgery At your preoperative visit for you history and physical you were given the following medications: Antibiotic: Start this  medication when you get home and take according to the instructions on the bottle. Zofran  4 mg:  This is to treat nausea and vomiting.  You can take this every 6 hours as needed and only if needed. Oxycodone  5 mg every 6 hours for 3 - 5 days.  This is to be used after you have taken the Motrin  or the Tylenol . 4.   Gabapentin 300 mg every 12 hours for 7 days.  Over the counter Medication to take: Ibuprofen  (Motrin ) 400 - 600 mg every 6 hour for 7 days Tylenol  500 mg every 6 hours for 7 days.  Only take the Oxycodone  after you have tried these two. MiraLAX  or stool softener of choice: Take this according to the bottle if you take the Norco.  If muscle work done:  Flexeril  5 mg every 12 hours for 7 days.  WHEN TO CALL Call your surgeon's office if any of the following occur: Fever 101 degrees F or greater Excessive bleeding or fluid from the incision site. Pain that increases over time without aid from the medications Redness, warmth, or pus draining from incision sites Persistent nausea or inability to take in liquids Severe misshapen area that underwent the operation.   Plastic surgery website: https://www.plasticsurgery.org/for-medical-professionals/education-and-resources/publications/breast-reconstruction-magazine Breast Reconstruction Awareness Campaign:  ChessContest.fr Plastic surgery Implant information:  https://www.plasticsurgery.org/patient-safety/breast-implant-safety

## 2024-07-26 NOTE — Op Note (Addendum)
 Breast Reduction Op note:    DATE OF PROCEDURE: 07/26/2024  LOCATION: [facility]  SURGEON: Estefana Fritter, DO  ASSISTANT: Donnice Edelson, PA  PREOPERATIVE DIAGNOSIS 1. Macromastia 2. Neck Pain 3. Back Pain  POSTOPERATIVE DIAGNOSIS 1. Macromastia 2. Neck Pain 3. Back Pain  PROCEDURES 1. Bilateral breast reduction.  Right reduction 929 g, Left reduction 1068 g  COMPLICATIONS: None.  DRAINS: none  INDICATIONS FOR PROCEDURE Theresa Cantrell is a 48 y.o. year-old female born on Dec 16, 1975,with a history of symptomatic macromastia with concomitant back pain, neck pain, shoulder grooving from her bra.   MRN: [id number]  CONSENT Informed consent was obtained directly from the patient. The risks, benefits and alternatives were fully discussed. Specific risks including but not limited to bleeding, infection, hematoma, seroma, scarring, pain, nipple necrosis, asymmetry, poor cosmetic results, and need for further surgery were discussed. The patient's questions were answered.  DESCRIPTION OF PROCEDURE  Patient was brought into the operating room and rested on the operating room table in the supine position.  SCDs were placed and appropriate padding was performed.  Antibiotics were given. The patient underwent general anesthesia and the chest was prepped and draped in a sterile fashion.  A timeout was performed and all information was confirmed to be correct by those in the room. Tumescent was placed in the lateral breast area.  Liposuction was done laterally for improved contour on each breast.  Right side: Preoperative markings were confirmed.  Incision lines were injected with local containing epinephrine .  After waiting for vasoconstriction, the marked lines were incised with a #15 blade.  A Wise-pattern superomedial breast reduction was performed by de-epithelializing the pedicle, using bovie to create the superomedial pedicle, and removing breast tissue from the lateral  and inferior portions of the breast.  Experel and Myriad were placed in the pocket. Care was taken to not undermine the breast pedicle. Hemostasis was achieved.  The nipple was gently rotated into position and the soft tissue closed with 4-0 Monocryl.   The pocket was irrigated and hemostasis confirmed.  The deep tissues were approximated with 3-0 PDS sutures.  The skin was closed with deep dermal 3-0 Monocryl and subcuticular 4-0 Monocryl sutures.  The nipple and skin flaps had good capillary refill at the end of the procedure.    Left side: Preoperative markings were confirmed.  Incision lines were injected with local containing epinephrine .  After waiting for vasoconstriction, the marked lines were incised with a #15 blade.  A Wise-pattern superomedial breast reduction was performed by de-epithelializing the pedicle, using bovie to create the superomedial pedicle, and removing breast tissue from the lateral and inferior portions of the breast.  Care was taken to not undermine the breast pedicle. Hemostasis was achieved. Experel and myriad were placed in the pocket.  The nipple was gently rotated into position and the soft tissue was closed with 4-0 Monocryl.  The patient was sat upright and size and shape symmetry was confirmed.  The pocket was irrigated and hemostasis confirmed.  The deep tissues were approximated with 3-0 PDS sutures. The skin was closed with deep dermal 3-0 Monocryl and subcuticular 4-0 Monocryl sutures.  Dermabond was applied.  A breast binder and ABDs were placed.  The nipple and skin flaps had good capillary refill at the end of the procedure.  The patient tolerated the procedure well. The patient was allowed to wake from anesthesia and taken to the recovery room in satisfactory condition.  The advanced practice practitioner (APP) assisted throughout  the case.  The APP was essential in retraction and counter traction when needed to make the case progress smoothly.  This retraction and  assistance made it possible to see the tissue plans for the procedure.  The assistance was needed for blood control, tissue re-approximation and assisted with closure of the incision site.

## 2024-07-26 NOTE — Anesthesia Preprocedure Evaluation (Signed)
 Anesthesia Evaluation  Patient identified by MRN, date of birth, ID band Patient awake    Reviewed: Allergy & Precautions, H&P , NPO status , Patient's Chart, lab work & pertinent test results  Airway Mallampati: II  TM Distance: >3 FB Neck ROM: Full    Dental  (+) Dental Advisory Given   Pulmonary sleep apnea , former smoker   Pulmonary exam normal breath sounds clear to auscultation       Cardiovascular hypertension, Pt. on medications negative cardio ROS Normal cardiovascular exam Rhythm:Regular Rate:Normal     Neuro/Psych   Anxiety Depression Bipolar Disorder   negative neurological ROS  negative psych ROS   GI/Hepatic Neg liver ROS,GERD  ,,  Endo/Other  negative endocrine ROS    Renal/GU negative Renal ROS  negative genitourinary   Musculoskeletal  (+) Arthritis , Osteoarthritis,    Abdominal  (+) + obese  Peds negative pediatric ROS (+)  Hematology negative hematology ROS (+)   Anesthesia Other Findings   Reproductive/Obstetrics negative OB ROS                              Anesthesia Physical Anesthesia Plan  ASA: 2  Anesthesia Plan: General   Post-op Pain Management:    Induction: Intravenous  PONV Risk Score and Plan: 3 and Ondansetron , Dexamethasone  and Midazolam   Airway Management Planned: LMA and Oral ETT  Additional Equipment:   Intra-op Plan:   Post-operative Plan: Extubation in OR  Informed Consent: I have reviewed the patients History and Physical, chart, labs and discussed the procedure including the risks, benefits and alternatives for the proposed anesthesia with the patient or authorized representative who has indicated his/her understanding and acceptance.     Dental advisory given  Plan Discussed with: CRNA  Anesthesia Plan Comments:         Anesthesia Quick Evaluation

## 2024-07-26 NOTE — Transfer of Care (Signed)
 Immediate Anesthesia Transfer of Care Note  Patient: Theresa Cantrell  Procedure(s) Performed: BREAST REDUCTION WITH LIPOSUCTION (Bilateral: Breast)  Patient Location: PACU  Anesthesia Type:General  Level of Consciousness: drowsy  Airway & Oxygen Therapy: Patient Spontanous Breathing and Patient connected to face mask oxygen  Post-op Assessment: Report given to RN and Post -op Vital signs reviewed and stable  Post vital signs: Reviewed and stable  Last Vitals:  Vitals Value Taken Time  BP 118/74 07/26/24 12:31  Temp    Pulse 100 07/26/24 12:38  Resp 12 07/26/24 12:38  SpO2 97 % 07/26/24 12:38  Vitals shown include unfiled device data.  Last Pain:  Vitals:   07/26/24 0922  TempSrc: Temporal  PainSc: 0-No pain         Complications: No notable events documented.

## 2024-07-27 ENCOUNTER — Encounter (HOSPITAL_BASED_OUTPATIENT_CLINIC_OR_DEPARTMENT_OTHER): Payer: Self-pay | Admitting: Plastic Surgery

## 2024-07-28 ENCOUNTER — Other Ambulatory Visit: Payer: Self-pay

## 2024-07-28 ENCOUNTER — Institutional Professional Consult (permissible substitution): Payer: MEDICAID | Admitting: Plastic Surgery

## 2024-07-29 ENCOUNTER — Encounter: Payer: Self-pay | Admitting: Plastic Surgery

## 2024-07-31 ENCOUNTER — Encounter: Payer: Self-pay | Admitting: Nurse Practitioner

## 2024-07-31 ENCOUNTER — Ambulatory Visit: Payer: Self-pay

## 2024-07-31 ENCOUNTER — Other Ambulatory Visit: Payer: Self-pay

## 2024-07-31 ENCOUNTER — Ambulatory Visit (INDEPENDENT_AMBULATORY_CARE_PROVIDER_SITE_OTHER): Payer: MEDICAID | Admitting: Nurse Practitioner

## 2024-07-31 VITALS — BP 113/72 | HR 88 | Temp 97.8°F | Ht 65.0 in | Wt 217.0 lb

## 2024-07-31 DIAGNOSIS — R600 Localized edema: Secondary | ICD-10-CM

## 2024-07-31 LAB — SURGICAL PATHOLOGY

## 2024-07-31 MED ORDER — FUROSEMIDE 20 MG PO TABS
20.0000 mg | ORAL_TABLET | Freq: Every day | ORAL | 0 refills | Status: DC
  Filled 2024-07-31: qty 3, 3d supply, fill #0

## 2024-07-31 NOTE — Telephone Encounter (Signed)
 Patient has an appt today at [facility]

## 2024-07-31 NOTE — Telephone Encounter (Signed)
 FYI Only or Action Required?: FYI only for provider.  Patient was last seen in primary care on 06/26/2024 by Vicci Barnie NOVAK, MD.  Called Nurse Triage reporting Leg Swelling.  Symptoms began several days ago.  Interventions attempted: Rest, hydration, or home remedies.  Symptoms are: gradually improving.  Triage Disposition: See Physician Within 24 Hours  Patient/caregiver understands and will follow disposition?: Yes  Reason for Disposition . [1] MODERATE leg swelling (e.g., swelling extends up to knees) AND [2] new-onset or getting worse  Answer Assessment - Initial Assessment Questions Breat reduction Surgery 9/24- Sleeping propped up in bed, elevating her feet and using compression socks. Over the weekend she was unable to wiggle her toes but is able to now so is starting to see a reduction but still concerned.   1. ONSET: When did the swelling start? (e.g., minutes, hours, days)     Thursday evening- got worse over the weekend 2. LOCATION: What part of the leg is swollen?  Are both legs swollen or just one leg?     Bilateral from the Knee down left worst then right 3. SEVERITY: How bad is the swelling? (e.g., localized; mild, moderate, severe)     Swollen and tight- unable to move toes 4. REDNESS: Is there redness or signs of infection?     denies 5. PAIN: Is the swelling painful to touch? If Yes, ask: How painful is it?   (Scale 1-10; mild, moderate or severe)     Denies- uncomfortable and worse at night  6. FEVER: Do you have a fever? If Yes, ask: What is it, how was it measured, and when did it start?      denies 7. CAUSE: What do you think is causing the leg swelling?     Post op breast reduction surgery  8. MEDICAL HISTORY: Do you have a history of blood clots (e.g., DVT), cancer, heart failure, kidney disease, or liver failure?     denies 9. RECURRENT SYMPTOM: Have you had leg swelling before? If Yes, ask: When was the last time? What  happened that time?     Knee issues and had to have fluid drained previously from one 10. OTHER SYMPTOMS: Do you have any other symptoms? (e.g., chest pain, difficulty breathing)       denies  Protocols used: Leg Swelling and Edema-A-AH

## 2024-07-31 NOTE — Progress Notes (Signed)
 Subjective   Patient ID: Theresa Cantrell, female    DOB: [birth year], 48 y.o.   MRN: [id number]  Chief Complaint  Patient presents with   Leg Swelling    Bilateral leg swelling - L leg is swelling more X4 days     Referring provider: Vicci Barnie NOVAK, MD  Kellie GORMAN Blush is a 48 y.o. female with Past Medical History: No date: Anxiety No date: Arthritis No date: Bipolar 1 disorder (HCC) No date: Depression No date: GERD (gastroesophageal reflux disease) No date: Hypertension No date: PTSD (post-traumatic stress disorder) No date: Sleep apnea   HPI  Patient presents today for an acute visit.  She has been experiencing peripheral edema since having breast reduction surgery last week.  She is currently on 20 mg of Lasix  every other day.  She has been wearing compression hose and trying to elevate her legs.  She states the edema has slightly improved since yesterday.  We discussed that she can take an extra Lasix  for the next 3 days.  We will check labs for kidney function today.  She denies any redness or pain to her calfs. Denies f/c/s, n/v/d, hemoptysis, PND.      Allergies  Allergen Reactions   Amlodipine  Swelling    Lower extremity edema.   Citrus     -Inhaled citrus at a chemical plant  My blood pressure was sky high my throat was itchy I couldn't catch my breath   Hydrochlorothiazide  Other (See Comments)    Cough   Lamotrigine Hives   Chlorthalidone  Rash and Other (See Comments)    Cramping    Immunization History  Administered Date(s) Administered   DTaP 02/17/1974, 02/03/1979, 04/07/1979, 06/21/1980   MMR 02/03/1979, 03/25/1992   PFIZER(Purple Top)SARS-COV-2 Vaccination 03/23/2020, 04/13/2020   Td 03/25/1992, 10/22/2009   Tdap 07/06/2019   Zoster Recombinant(Shingrix ) 06/26/2024    Tobacco History: Social History   Tobacco Use  Smoking Status Former   Current packs/day: 0.25   Types: Cigarettes  Smokeless Tobacco Never   Counseling given:  Not Answered   Outpatient Encounter Medications as of 07/31/2024  Medication Sig   ARIPiprazole  (ABILIFY ) 15 MG tablet Take 1 tablet (15 mg total) by mouth daily.   Cholecalciferol  (VITAMIN D ) 50 MCG (2000 UT) tablet Take 1 tablet (2,000 Units total) by mouth daily.   cyclobenzaprine  (FLEXERIL ) 10 MG tablet Take 1 tablet (10 mg total) by mouth every 12 (twelve) hours as needed for spasms   cyclobenzaprine  (FLEXERIL ) 10 MG tablet Take 1 tablet (10 mg total) by mouth every 12 (twelve) hours as needed for spasm.   FLUoxetine  (PROZAC ) 20 MG capsule Take 1 capsule (20 mg total) by mouth daily.   furosemide  (LASIX ) 20 MG tablet TAKE 1 TABLET BY MOUTH ON MONDAY, WEDNESDAY AND FRIDAY   furosemide  (LASIX ) 20 MG tablet Take 1 tablet (20 mg total) by mouth daily.   gabapentin (NEURONTIN) 300 MG capsule Take 300 mg by mouth 3 (three) times daily.   HYDROcodone -acetaminophen  (NORCO/VICODIN) 5-325 MG tablet Take 1 tablet by mouth every 8 (eight) hours as needed.   HYDROcodone -acetaminophen  (NORCO/VICODIN) 5-325 MG tablet Take 1 tablet by mouth every 8 (eight) hours as needed for severe pain only   hydrOXYzine  (ATARAX ) 10 MG tablet Take 1 tablet (10 mg total) by mouth 3 (three) times daily as needed.   meloxicam  (MOBIC ) 7.5 MG tablet Take 1 tablet (7.5 mg total) by mouth 2 (two) times daily as needed.   NIFEdipine  (PROCARDIA -XL/NIFEDICAL-XL) 30 MG 24 hr  tablet Take 1 tablet by mouth daily   olopatadine  (PATANOL) 0.1 % ophthalmic solution Place 1 drop into both eyes 2 (two) times daily as needed for allergies (as needed for allergy symptoms).   omeprazole  (PRILOSEC) 20 MG capsule Take 1 capsule (20 mg total) by mouth 2 (two) times daily before a meal.   ondansetron  (ZOFRAN ) 4 MG tablet Take 1 tablet (4 mg total) by mouth every 8 (eight) hours as needed for up to 20 doses for nausea or vomiting.   prazosin  (MINIPRESS ) 2 MG capsule Take 1 capsule (2 mg total) by mouth at bedtime.   Semaglutide -Weight Management  (WEGOVY ) 2.4 MG/0.75ML SOAJ Inject 2.4 mg into the skin once a week.   traZODone  (DESYREL ) 150 MG tablet Take 1 tablet (150 mg total) by mouth at bedtime as needed for sleep.   valACYclovir  (VALTREX ) 1000 MG tablet Take 1 tablet (1,000 mg total) by mouth daily.   valsartan  (DIOVAN ) 160 MG tablet Take 1 tablet (160 mg total) by mouth daily.   No facility-administered encounter medications on file as of 07/31/2024.    Review of Systems  Review of Systems  Constitutional: Negative.   HENT: Negative.    Cardiovascular:  Positive for leg swelling.  Gastrointestinal: Negative.   Allergic/Immunologic: Negative.   Neurological: Negative.   Psychiatric/Behavioral: Negative.       Objective:   BP 113/72 (BP Location: Left Arm, Patient Position: Sitting, Cuff Size: Large)   Pulse 88   Temp 97.8 F (36.6 C) (Oral)   Ht 5' 5 (1.651 m)   Wt 217 lb (98.4 kg)   SpO2 97%   BMI 36.11 kg/m   Wt Readings from Last 5 Encounters:  07/31/24 217 lb (98.4 kg)  07/26/24 208 lb 1.8 oz (94.4 kg)  07/13/24 212 lb (96.2 kg)  06/26/24 214 lb (97.1 kg)  05/15/24 210 lb (95.3 kg)     Physical Exam Vitals and nursing note reviewed.  Constitutional:      General: She is not in acute distress.    Appearance: She is well-developed.  Cardiovascular:     Rate and Rhythm: Normal rate and regular rhythm.  Pulmonary:     Effort: Pulmonary effort is normal.     Breath sounds: Normal breath sounds.  Musculoskeletal:     Right lower leg: Edema present.     Left lower leg: Edema present.  Neurological:     Mental Status: She is alert and oriented to person, place, and time.       Assessment & Plan:   Peripheral edema -     Comprehensive metabolic panel with GFR -     Furosemide ; Take 1 tablet (20 mg total) by mouth daily.  Dispense: 3 tablet; Refill: 0     Return if symptoms worsen or fail to improve.   Bascom GORMAN Borer, NP 07/31/2024

## 2024-08-01 ENCOUNTER — Encounter (HOSPITAL_COMMUNITY): Payer: Self-pay | Admitting: Physician Assistant

## 2024-08-01 ENCOUNTER — Telehealth (HOSPITAL_COMMUNITY): Payer: MEDICAID | Admitting: Physician Assistant

## 2024-08-01 ENCOUNTER — Ambulatory Visit: Payer: Self-pay | Admitting: Internal Medicine

## 2024-08-01 DIAGNOSIS — F3181 Bipolar II disorder: Secondary | ICD-10-CM

## 2024-08-01 DIAGNOSIS — F4310 Post-traumatic stress disorder, unspecified: Secondary | ICD-10-CM | POA: Diagnosis not present

## 2024-08-01 DIAGNOSIS — F419 Anxiety disorder, unspecified: Secondary | ICD-10-CM | POA: Diagnosis not present

## 2024-08-01 LAB — COMPREHENSIVE METABOLIC PANEL WITH GFR
ALT: 25 IU/L (ref 0–32)
AST: 22 IU/L (ref 0–40)
Albumin: 3.9 g/dL (ref 3.9–4.9)
Alkaline Phosphatase: 116 IU/L (ref 41–116)
BUN/Creatinine Ratio: 19 (ref 9–23)
BUN: 15 mg/dL (ref 6–24)
Bilirubin Total: 0.2 mg/dL (ref 0.0–1.2)
CO2: 24 mmol/L (ref 20–29)
Calcium: 9.7 mg/dL (ref 8.7–10.2)
Chloride: 100 mmol/L (ref 96–106)
Creatinine, Ser: 0.81 mg/dL (ref 0.57–1.00)
Globulin, Total: 2.1 g/dL (ref 1.5–4.5)
Glucose: 86 mg/dL (ref 70–99)
Potassium: 4.6 mmol/L (ref 3.5–5.2)
Sodium: 138 mmol/L (ref 134–144)
Total Protein: 6 g/dL (ref 6.0–8.5)
eGFR: 88 mL/min/1.73 (ref >59)

## 2024-08-01 MED ORDER — PRAZOSIN HCL 2 MG PO CAPS: 2.0000 mg | ORAL_CAPSULE | Freq: Every day | ORAL | 2 refills | Status: DC

## 2024-08-01 MED ORDER — TRAZODONE HCL 150 MG PO TABS: 150.0000 mg | ORAL_TABLET | Freq: Every evening | ORAL | 1 refills | Status: DC | PRN

## 2024-08-01 MED ORDER — HYDROXYZINE HCL 10 MG PO TABS: 10.0000 mg | ORAL_TABLET | Freq: Three times a day (TID) | ORAL | 1 refills | Status: DC | PRN

## 2024-08-01 MED ORDER — FLUOXETINE HCL 20 MG PO CAPS: 20.0000 mg | ORAL_CAPSULE | Freq: Every day | ORAL | 2 refills | Status: DC

## 2024-08-01 MED ORDER — ARIPIPRAZOLE 15 MG PO TABS
15.0000 mg | ORAL_TABLET | Freq: Every day | ORAL | 2 refills | Status: DC
Start: 2024-08-01 — End: 2024-09-17

## 2024-08-01 NOTE — Progress Notes (Signed)
 [facility] MD/PA/NP OP Progress Note  Virtual Visit via Video Note  I connected with Theresa Cantrell on 08/01/24 at  3:30 PM EDT by a video enabled telemedicine application and verified that I am speaking with the correct person using two identifiers.  Location: Patient: Home Provider: Clinic   I discussed the limitations of evaluation and management by telemedicine and the availability of in person appointments. The patient expressed understanding and agreed to proceed.  Follow Up Instructions:  I discussed the assessment and treatment plan with the patient. The patient was provided an opportunity to ask questions and all were answered. The patient agreed with the plan and demonstrated an understanding of the instructions.   The patient was advised to call back or seek an in-person evaluation if the symptoms worsen or if the condition fails to improve as anticipated.  I provided 20 minutes of non-face-to-face time during this encounter.  Theresa FORBES Bolster, PA    08/01/2024 3:30 PM Theresa Cantrell  MRN:  [id number]  Chief Complaint:  Chief Complaint  Patient presents with   Follow-up   Medication Refill   HPI:   Theresa Cantrell is a 48 year old female with a past psychiatric history significant for bipolar 2 disorder (major depressive episode), PTSD, and anxiety who presents to [facility] via virtual video visit for follow-up and medication management.  Patient is currently being managed on the medications:  Trazodone  150 mg at bedtime Abilify  15 mg daily Fluoxetine  20 mg daily Hydroxyzine  10 mg 3 times daily as needed Prazosin  2 mg at bedtime  Patient reports that she recently underwent breast reduction surgery to help with her weight and to reduce the strain on her back.  Patient reports that she is currently recovering and has had to use some fluid pills to help relieve the fluid buildup that she has been experiencing in her legs.  Patient  reports that she is taking her medications regularly and denies experiencing any adverse side effects at this time.  Patient endorses minimal depression and rates her depression a 7 out of 10 with 10 being most severe.  Patient endorses depressive episodes 1 to 2 days/week.  Patient endorses the following depressive symptoms: decreased concentration, decreased energy, feelings of guilt/worthlessness (improving), and hopelessness (improving).  Patient denies feelings of sadness, irritability, or lack of motivation.  Patient further denies anxiety but endorses stressors attributed to trying to lose weight and dealing with the recent discontinuation of her food stamps.  A PHQ-9 screen was performed with the patient scoring a 17.  A GAD-7 screen was also performed with the patient scoring a 15.  Patient is alert and oriented x 4, calm, cooperative, and fully engaged in conversation during the encounter.  Patient endorses good mood.  Patient exhibits depressed mood with appropriate affect.  Patient denies suicidal or homicidal ideations.  She further denies auditory or visual hallucinations and does not appear to be responding to internal/external stimuli.  Patient endorses poor sleep and receives on average 3 to 4 hours of sleep due to having to lay on her back post surgery.  Patient endorses fair appetite and eats on average 1-2 meals per day.  Patient denies alcohol consumption, tobacco use, or illicit drug use.  Visit Diagnosis:    ICD-10-CM   1. Anxiety  F41.9 hydrOXYzine  (ATARAX ) 10 MG tablet    FLUoxetine  (PROZAC ) 20 MG capsule    2. Bipolar 2 disorder, major depressive episode (HCC)  F31.81 ARIPiprazole  (ABILIFY ) 15  MG tablet    traZODone  (DESYREL ) 150 MG tablet    FLUoxetine  (PROZAC ) 20 MG capsule    3. PTSD (post-traumatic stress disorder)  F43.10 prazosin  (MINIPRESS ) 2 MG capsule    FLUoxetine  (PROZAC ) 20 MG capsule     Past Psychiatric History:  Disorder (major depressive  episode) Anxiety PTSD  Past Medical History:  Past Medical History:  Diagnosis Date   Anxiety    Arthritis    Bipolar 1 disorder (HCC)    Depression    GERD (gastroesophageal reflux disease)    Hypertension    PTSD (post-traumatic stress disorder)    Sleep apnea     Past Surgical History:  Procedure Laterality Date   ABDOMINAL HYSTERECTOMY     ANTERIOR CERVICAL DECOMP/DISCECTOMY FUSION N/A 06/06/2021   Procedure: C4-5, C5-6 ANTERIOR CERVICAL DISCECTOMY FUSION, ALLOGRAFT, PLATE;  Surgeon: Barbarann Oneil BROCKS, MD;  Location: [facility];  Service: Orthopedics;  Laterality: N/A;   BREAST REDUCTION SURGERY Bilateral 07/26/2024   Procedure: BREAST REDUCTION WITH LIPOSUCTION;  Surgeon: Lowery Estefana RAMAN, DO;  Location: [facility];  Service: Plastics;  Laterality: Bilateral;   CHOLECYSTECTOMY     DILATION AND CURETTAGE OF UTERUS     FOOT SURGERY Bilateral    two pins in each foot   JOINT REPLACEMENT Left 2018   partial knee replacement   KNEE SURGERY Left 2018   partial knee replacement   NECK SURGERY  06/06/2021   C4-5, C5-6 ANTERIOR CERVICAL DISCECTOMY FUSION, ALLOGRAFT, PLATE    Family Psychiatric History:  Father - bipolar disorder, schizophrenia. Patient reports that her father was on medications but does not remember what medications he was on Mother - depression, anxiety.  Patient reports that her mother was on medications but does not remember what medications she was on   Family history of suicide: Patient reports that her father attempted multiple times in the past Family history of homicide: Patient denies Family history of substance abuse: Patient reports that her father abused crack cocaine, marijuana, and alcohol  Family History:  Family History  Problem Relation Age of Onset   Hypertension Mother    Cancer Mother        breast   Anxiety disorder Mother    Hypertension Father    Schizophrenia Father    Bipolar disorder Father    Anxiety disorder Father     Parkinson's disease Father    Colon cancer Neg Hx    Colon polyps Neg Hx    Esophageal cancer Neg Hx    Rectal cancer Neg Hx    Stomach cancer Neg Hx     Social History:  Social History   Socioeconomic History   Marital status: Single    Spouse name: Not on file   Number of children: 1   Years of education: Not on file   Highest education level: Some college, no degree  Occupational History   Not on file  Tobacco Use   Smoking status: Former    Current packs/day: 0.25    Types: Cigarettes   Smokeless tobacco: Never  Vaping Use   Vaping status: Never Used  Substance and Sexual Activity   Alcohol use: Yes    Alcohol/week: 2.0 standard drinks of alcohol    Types: 1 Glasses of wine, 1 Cans of beer per week   Drug use: No   Sexual activity: Not Currently    Birth control/protection: Surgical  Other Topics Concern   Not on file  Social History Narrative  Not on file   Social Drivers of Health   Financial Resource Strain: High Risk (04/20/2024)   Overall Financial Resource Strain (CARDIA)    Difficulty of Paying Living Expenses: Very hard  Food Insecurity: Food Insecurity Present (04/20/2024)   Hunger Vital Sign    Worried About Running Out of Food in the Last Year: Sometimes true    Ran Out of Food in the Last Year: Often true  Transportation Needs: No Transportation Needs (04/20/2024)   PRAPARE - Administrator, Civil Service (Medical): No    Lack of Transportation (Non-Medical): No  Physical Activity: Insufficiently Active (04/20/2024)   Exercise Vital Sign    Days of Exercise per Week: 3 days    Minutes of Exercise per Session: 10 min  Stress: Stress Concern Present (04/20/2024)   [employer] of Occupational Health - Occupational Stress Questionnaire    Feeling of Stress: Very much  Social Connections: Socially Isolated (04/20/2024)   Social Connection and Isolation Panel    Frequency of Communication with Friends and Family: More than three  times a week    Frequency of Social Gatherings with Friends and Family: Three times a week    Attends Religious Services: Never    Active Member of Clubs or Organizations: No    Attends Banker Meetings: Not on file    Marital Status: Never married    Allergies:  Allergies  Allergen Reactions   Amlodipine  Swelling    Lower extremity edema.   Citrus     -Inhaled citrus at a chemical plant  My blood pressure was sky high my throat was itchy I couldn't catch my breath   Hydrochlorothiazide  Other (See Comments)    Cough   Lamotrigine Hives   Chlorthalidone  Rash and Other (See Comments)    Cramping    Metabolic Disorder Labs: Lab Results  Component Value Date   HGBA1C 5.7 (H) 04/24/2024   No results found for: PROLACTIN Lab Results  Component Value Date   CHOL 187 09/17/2023   TRIG 105 09/17/2023   HDL 71 09/17/2023   CHOLHDL 2.6 09/17/2023   LDLCALC 97 09/17/2023   LDLCALC 86 11/07/2020   Lab Results  Component Value Date   TSH 0.711 11/23/2022   TSH 0.484 03/31/2018    Therapeutic Level Labs: No results found for: LITHIUM No results found for: VALPROATE No results found for: CBMZ  Current Medications: Current Outpatient Medications  Medication Sig Dispense Refill   ARIPiprazole  (ABILIFY ) 15 MG tablet Take 1 tablet (15 mg total) by mouth daily. 30 tablet 2   Cholecalciferol  (VITAMIN D ) 50 MCG (2000 UT) tablet Take 1 tablet (2,000 Units total) by mouth daily. 100 tablet 1   cyclobenzaprine  (FLEXERIL ) 10 MG tablet Take 1 tablet (10 mg total) by mouth every 12 (twelve) hours as needed for spasms 60 tablet 1   cyclobenzaprine  (FLEXERIL ) 10 MG tablet Take 1 tablet (10 mg total) by mouth every 12 (twelve) hours as needed for spasm. 60 tablet 1   FLUoxetine  (PROZAC ) 20 MG capsule Take 1 capsule (20 mg total) by mouth daily. 30 capsule 2   furosemide  (LASIX ) 20 MG tablet TAKE 1 TABLET BY MOUTH ON MONDAY, WEDNESDAY AND FRIDAY 36 tablet 0    furosemide  (LASIX ) 20 MG tablet Take 1 tablet (20 mg total) by mouth daily. 3 tablet 0   gabapentin (NEURONTIN) 300 MG capsule Take 300 mg by mouth 3 (three) times daily.     HYDROcodone -acetaminophen  (NORCO/VICODIN) 5-325 MG tablet  Take 1 tablet by mouth every 8 (eight) hours as needed.     HYDROcodone -acetaminophen  (NORCO/VICODIN) 5-325 MG tablet Take 1 tablet by mouth every 8 (eight) hours as needed for severe pain only 90 tablet 0   hydrOXYzine  (ATARAX ) 10 MG tablet Take 1 tablet (10 mg total) by mouth 3 (three) times daily as needed. 75 tablet 1   meloxicam  (MOBIC ) 7.5 MG tablet Take 1 tablet (7.5 mg total) by mouth 2 (two) times daily as needed. 60 tablet 1   NIFEdipine  (PROCARDIA -XL/NIFEDICAL-XL) 30 MG 24 hr tablet Take 1 tablet by mouth daily 90 tablet 0   olopatadine  (PATANOL) 0.1 % ophthalmic solution Place 1 drop into both eyes 2 (two) times daily as needed for allergies (as needed for allergy symptoms). 5 mL 12   omeprazole  (PRILOSEC) 20 MG capsule Take 1 capsule (20 mg total) by mouth 2 (two) times daily before a meal. 180 capsule 0   ondansetron  (ZOFRAN ) 4 MG tablet Take 1 tablet (4 mg total) by mouth every 8 (eight) hours as needed for up to 20 doses for nausea or vomiting. 20 tablet 0   prazosin  (MINIPRESS ) 2 MG capsule Take 1 capsule (2 mg total) by mouth at bedtime. 30 capsule 2   Semaglutide -Weight Management (WEGOVY ) 2.4 MG/0.75ML SOAJ Inject 2.4 mg into the skin once a week. 9 mL 1   traZODone  (DESYREL ) 150 MG tablet Take 1 tablet (150 mg total) by mouth at bedtime as needed for sleep. 30 tablet 1   valACYclovir  (VALTREX ) 1000 MG tablet Take 1 tablet (1,000 mg total) by mouth daily. 90 tablet 1   valsartan  (DIOVAN ) 160 MG tablet Take 1 tablet (160 mg total) by mouth daily. 90 tablet 1   No current facility-administered medications for this visit.     Musculoskeletal: Strength & Muscle Tone: within normal limits Gait & Station: ataxic, patient ambulates with a cane Patient  leans: N/A  Psychiatric Specialty Exam: Review of Systems  Psychiatric/Behavioral:  Positive for dysphoric mood and sleep disturbance. Negative for decreased concentration, hallucinations, self-injury and suicidal ideas. The patient is nervous/anxious. The patient is not hyperactive.     There were no vitals taken for this visit.There is no height or weight on file to calculate BMI.  General Appearance: Casual  Eye Contact:  Good  Speech:  Clear and Coherent and Normal Rate  Volume:  Normal  Mood:  Anxious and Depressed  Affect:  Appropriate  Thought Process:  Coherent, Goal Directed, and Descriptions of Associations: Intact  Orientation:  Full (Time, Place, and Person)  Thought Content: WDL   Suicidal Thoughts:  No  Homicidal Thoughts:  No  Memory:  Immediate;   Good Recent;   Good Remote;   Good  Judgement:  Good  Insight:  Good  Psychomotor Activity:  Normal  Concentration:  Concentration: Good and Attention Span: Good  Recall:  Good  Fund of Knowledge: Good  Language: Good  Akathisia:  No  Handed:  Right  AIMS (if indicated): not done  Assets:  Communication Skills Desire for Improvement Financial Resources/Insurance Housing Social Support Transportation Vocational/Educational  ADL's:  Intact  Cognition: WNL  Sleep:  Fair   Screenings: AIMS    Flowsheet Row Video Visit from 08/01/2024 in [facility] Video Visit from 05/30/2024 in [facility] Video Visit from 03/28/2024 in [facility]  AIMS Total Score 0 0 0   AUDIT    Flowsheet Row Office Visit from 05/17/2023 in  [facility] - A Dept Of [place] Most recent reading at 04/02/2023  8:48 AM Appointment from 04/06/2023 in [facility] - A Dept Of [place] [facility]. [facility] Most recent reading at 04/02/2023  8:48 AM  Alcohol Use Disorder Identification Test Final Score  (AUDIT) 3 3   GAD-7    Flowsheet Row Video Visit from 08/01/2024 in [facility] Office Visit from 06/26/2024 in [facility] - A Dept Of [place]. [facility] Video Visit from 05/30/2024 in [facility] Office Visit from 04/24/2024 in [facility] - A Dept Of [place]. [facility] Video Visit from 03/28/2024 in [facility]  Total GAD-7 Score 15 21 16 20 18    PHQ2-9    Flowsheet Row Video Visit from 08/01/2024 in [facility] Office Visit from 06/26/2024 in [facility] - A Dept Of [place]. [facility] Video Visit from 05/30/2024 in [facility] Office Visit from 04/24/2024 in [facility] - A Dept Of [place]. [facility] Video Visit from 03/28/2024 in [facility]  PHQ-2 Total Score 5 5 2 6 4   PHQ-9 Total Score 17 16 15 16 9    Flowsheet Row Video Visit from 08/01/2024 in [facility]mission (Discharged) from 07/26/2024 in [facility] Video Visit from 05/30/2024 in [facility]  C-SSRS RISK CATEGORY Moderate Risk No Risk Moderate Risk     Assessment and Plan:   Theresa Cantrell is a 48 year old female with a past psychiatric history significant for bipolar 2 disorder (major depressive episode), PTSD, and anxiety who presents to [facility] via virtual video visit for follow-up and medication management.  Patient presents to the encounter stating that she has been taking her medication regularly and denies experiencing any adverse side effects.  An aims assessment was performed with the patient scoring a 0.  Patient endorses minimal depression and denies anxiety but states that she is still struggling with some stressors in her  life.  A PHQ-9 screening was performed with the patient scoring a 17.  A GAD-7 screen was also performed with the patient scoring a 15.  Despite her depression and anxiety, patient would like to continue taking her medications as prescribed.  Patient medications to be e-prescribed to pharmacy of choice.  A [place] Suicide Severity Rating Scale was performed with the patient being considered moderate risk.  Patient denies suicidal ideations and is able to contract for safety following the conclusion of the encounter.  Safety planning was discussed with the patient prior to the conclusion of the encounter.  - Patient was instructed to call 911 in the event of a mental health crisis. - Patient was instructed to call 59 Suicide and Crisis Lifeline in the event of a mental health crisis. - Patient was instructed to present to [facility] in the event of a mental health crisis  Collaboration of Care: Collaboration of Care: Medication Management AEB provider managing patient's psychiatric medications, Primary Care Provider AEB patient being seen by internal medicine, Psychiatrist AEB patient being seen by a mental health provider at this facility, and Referral or follow-up with counselor/therapist AEB patient being seen by a licensed clinical social worker at this facility  Patient/Guardian  was advised Release of Information must be obtained prior to any record release in order to collaborate their care with an outside provider. Patient/Guardian was advised if they have not already done so to contact the registration department to sign all necessary forms in order for us  to release information regarding their care.   Consent: Patient/Guardian gives verbal consent for treatment and assignment of benefits for services provided during this visit. Patient/Guardian expressed understanding and agreed to proceed.   1. Anxiety  - hydrOXYzine  (ATARAX ) 10 MG tablet; Take 1 tablet  (10 mg total) by mouth 3 (three) times daily as needed.  Dispense: 75 tablet; Refill: 1 - FLUoxetine  (PROZAC ) 20 MG capsule; Take 1 capsule (20 mg total) by mouth daily.  Dispense: 30 capsule; Refill: 2  2. Bipolar 2 disorder, major depressive episode (HCC)  - ARIPiprazole  (ABILIFY ) 15 MG tablet; Take 1 tablet (15 mg total) by mouth daily.  Dispense: 30 tablet; Refill: 2 - traZODone  (DESYREL ) 150 MG tablet; Take 1 tablet (150 mg total) by mouth at bedtime as needed for sleep.  Dispense: 30 tablet; Refill: 1 - FLUoxetine  (PROZAC ) 20 MG capsule; Take 1 capsule (20 mg total) by mouth daily.  Dispense: 30 capsule; Refill: 2  3. PTSD (post-traumatic stress disorder)  - prazosin  (MINIPRESS ) 2 MG capsule; Take 1 capsule (2 mg total) by mouth at bedtime.  Dispense: 30 capsule; Refill: 2 - FLUoxetine  (PROZAC ) 20 MG capsule; Take 1 capsule (20 mg total) by mouth daily.  Dispense: 30 capsule; Refill: 2  Patient to follow up in 7 weeks Provider spent a total of 20 minutes with the patient/reviewing patient's chart  Theresa FORBES Bolster, PA 08/01/2024, 3:30 PM

## 2024-08-02 ENCOUNTER — Other Ambulatory Visit (HOSPITAL_COMMUNITY): Payer: Self-pay

## 2024-08-02 ENCOUNTER — Ambulatory Visit: Payer: Self-pay | Admitting: Nurse Practitioner

## 2024-08-04 ENCOUNTER — Ambulatory Visit: Payer: MEDICAID | Admitting: Plastic Surgery

## 2024-08-04 ENCOUNTER — Other Ambulatory Visit (HOSPITAL_COMMUNITY): Payer: Self-pay

## 2024-08-04 ENCOUNTER — Other Ambulatory Visit: Payer: Self-pay

## 2024-08-04 ENCOUNTER — Encounter: Payer: Self-pay | Admitting: Plastic Surgery

## 2024-08-04 VITALS — BP 90/59 | HR 99

## 2024-08-04 DIAGNOSIS — N62 Hypertrophy of breast: Secondary | ICD-10-CM

## 2024-08-04 NOTE — Progress Notes (Signed)
 The patient is a 48 year old female here for follow-up after undergoing bilateral breast reduction on September 24.  She had over 1000 g removed from the left breast and 929 removed from the right breast.  No sign of a hematoma or seroma.  I was able to remove the dressing.  Steri-Strips are still in place.  Will keep those in place until her next visit.  Continue with the sports bra.  Pictures at the next visit.

## 2024-08-08 ENCOUNTER — Other Ambulatory Visit (HOSPITAL_COMMUNITY): Payer: Self-pay

## 2024-08-08 MED ORDER — HYDROCODONE-ACETAMINOPHEN 5-325 MG PO TABS
1.0000 | ORAL_TABLET | Freq: Three times a day (TID) | ORAL | 0 refills | Status: DC | PRN
  Filled 2024-08-08: qty 90, 30d supply, fill #0
  Filled 2024-08-21 (×2): qty 15, 5d supply, fill #0
  Filled 2024-09-01: qty 75, 25d supply, fill #1
  Filled ????-??-??: fill #1

## 2024-08-09 ENCOUNTER — Telehealth: Payer: Self-pay | Admitting: Plastic Surgery

## 2024-08-09 NOTE — Telephone Encounter (Signed)
 Patient says that she has drainage coming from both breast, more from right than left. It as a yellowish/greenish color, she say it also has a weird smell coming from the drainage. Hasw some discomfort from left breast, it feels like a stabbing pain and right feels fine. Drainage has slowed done this morning. Please reach out and advise

## 2024-08-09 NOTE — Telephone Encounter (Signed)
 Returned patients call.  Steri strips fell off Monday, has been having an odor. Indicates she has BL small opening at the [employer] area.  Patient denies having fevers, diarrhea, vomiting, nor nausea.  She also complained of sharp pain coming from left breast. Explained to her this was normal and it is from the nerve endings healing.  Instructed for patient to apply Vaseline on both breast were the small wounds are.  She can use a ABD pad or Cotex pad under her breast covering the wound, wear a fitted inexpensive tank top, then her sports bra.  This will help with the friction of the sports bra rubbing against her skin. Her next appointment is not until 08/18/2024. She can call our office before then, should the wound become worse, runs a fever, nausea, vomiting, or diarrhea. Patient understood and agreed with plan.

## 2024-08-10 ENCOUNTER — Other Ambulatory Visit: Payer: Self-pay | Admitting: Nurse Practitioner

## 2024-08-10 ENCOUNTER — Other Ambulatory Visit: Payer: Self-pay

## 2024-08-10 DIAGNOSIS — R600 Localized edema: Secondary | ICD-10-CM

## 2024-08-10 MED ORDER — FUROSEMIDE 20 MG PO TABS
20.0000 mg | ORAL_TABLET | Freq: Every day | ORAL | 0 refills | Status: DC
  Filled 2024-08-10 – 2024-08-22 (×2): qty 3, 3d supply, fill #0

## 2024-08-13 ENCOUNTER — Other Ambulatory Visit: Payer: Self-pay | Admitting: Internal Medicine

## 2024-08-13 DIAGNOSIS — A6004 Herpesviral vulvovaginitis: Secondary | ICD-10-CM

## 2024-08-14 ENCOUNTER — Other Ambulatory Visit: Payer: Self-pay

## 2024-08-14 MED ORDER — VALACYCLOVIR HCL 1 G PO TABS
1000.0000 mg | ORAL_TABLET | Freq: Every day | ORAL | 0 refills | Status: DC
  Filled 2024-08-14: qty 30, 30d supply, fill #0
  Filled 2024-09-24: qty 30, 30d supply, fill #1
  Filled 2024-10-24 – 2024-11-07 (×2): qty 30, 30d supply, fill #2

## 2024-08-15 ENCOUNTER — Ambulatory Visit: Payer: MEDICAID | Admitting: Orthopaedic Surgery

## 2024-08-17 ENCOUNTER — Ambulatory Visit (HOSPITAL_COMMUNITY): Payer: MEDICAID | Admitting: Mental Health

## 2024-08-17 DIAGNOSIS — F3181 Bipolar II disorder: Secondary | ICD-10-CM | POA: Diagnosis not present

## 2024-08-17 NOTE — Progress Notes (Signed)
 Patient is a pleasant 48 year old female s/p bilateral breast reduction performed 07/26/2024 Dr. Lowery who presents to clinic for postoperative follow-up.  She was last seen here in clinic on 08/04/2024.  At that time, her exam was benign and without evidence of hematoma or seroma.  Plan was to keep Steri-Strips in place until subsequent visit and then obtain photos at that time.  Today, patient is doing well from a postoperative standpoint.  She still has some mild generalized discomfort bilaterally, but otherwise no complaints.  Specifically, denies any chest pain, difficulty breathing, leg swelling, or fevers.  She is ambulatory.  She does believe that she has had some improvement in her back pain.  Patient inquires about sleeping on her sides.  On exam, breasts have excellent shape and symmetry.  Soft throughout.  No palpable areas of firmness.  Steri-Strips are all removed and incisions are CDI throughout with exception of a small, less than 1 cm superior T zone wound right breast.  Superficial and nondraining.  No surrounding erythema or induration.  Moderate amount of residual Dermabond.  Recommending Vaseline to the incisions throughout.  Continue with activity modifications and compressive garments.  She may begin to sleep on her sides.  Follow-up as scheduled for likely final postoperative encounter.  Picture(s) obtained of the patient and placed in the chart were with the patient's or guardian's permission.

## 2024-08-17 NOTE — Progress Notes (Signed)
   THERAPIST PROGRESS NOTE Virtual Visit via Video Note  I connected with Kellie GORMAN Blush on 08/17/24 at  4:00 PM EDT by a video enabled telemedicine application and verified that I am speaking with the correct person using two identifiers.  Location: Patient: home address on file Provider: office   I discussed the limitations of evaluation and management by telemedicine and the availability of in person appointments. The patient expressed understanding and agreed to proceed.  I discussed the assessment and treatment plan with the patient. The patient was provided an opportunity to ask questions and all were answered. The patient agreed with the plan and demonstrated an understanding of the instructions.   The patient was advised to call back or seek an in-person evaluation if the symptoms worsen or if the condition fails to improve as anticipated.  I provided 38 minutes of non-face-to-face time during this encounter.   Ty Bernice Savant, [facility]   Session Time: 4:04 pm (38  Participation Level: Active  Behavioral Response: CasualAlertEuthymic  Type of Therapy: Individual Therapy  Treatment Goals addressed: STG: Rubena will increase management of moods and stressors AEB development of x 3 effective coping skills with ability to process stressors in effective manner per self report within the next 90 days   ProgressTowards Goals: Progressing  Interventions: CBT and Supportive  Summary: RAIZA KIESEL is a 48 y.o. female who presents with dx of bipolar disorder, PTSD and anxiety. Presents for session alert and oriented; mood and affect stable; adequate. Speech clear and coherent at normal rate and tone. Engaged and receptive to interventions. Shares for moods to be stable denies excessive highs or lows; pleasant, jovial demeanor. Shares chief complaint of money stress. Shares for family to be supportive and hopeful of upcoming disability hearing. Shares improvement in back pain  follow breast reduction and contributes decreased pain to improvement in affect and mood. Shares thoughts on current financial stressors and receptive ot therapist education on food banks. Shares thoughts on current political climate and ways in which it has effected her life. Shares ongoing engagement with friends and family as means of coping and balanced thinking and cognitive coping. Denies safety concerns.    Suicidal/Homicidal: Nowithout intent/plan  Therapist Response: Therapist engaged Shaaron in tele-therapy session. Completed check in and assessed for current level of functioning, sxs management and level of stressors. Explored ability to engage coping skills and ability to navigate stressors. Provided support and encouraged. Explored factors that have supported in improvement in mood and current daily life. Provided encouragement with upcoming progress with x 2 cases currently pending. Educated on local food banks/pantries. Encouraged ability to cope with stress and engagement in things in which she enjoys with balance in daily life. Reviewed session and provided follow up  Plan: Return again in  x 5 weeks.  Diagnosis: Bipolar 2 disorder, major depressive episode (HCC)  Collaboration of Care: Other None  Patient/Guardian was advised Release of Information must be obtained prior to any record release in order to collaborate their care with an outside provider. Patient/Guardian was advised if they have not already done so to contact the registration department to sign all necessary forms in order for us  to release information regarding their care.   Consent: Patient/Guardian gives verbal consent for treatment and assignment of benefits for services provided during this visit. Patient/Guardian expressed understanding and agreed to proceed.   Ty Bernice Jellico, [facility] 08/17/2024

## 2024-08-18 ENCOUNTER — Other Ambulatory Visit (HOSPITAL_COMMUNITY): Payer: Self-pay

## 2024-08-18 ENCOUNTER — Ambulatory Visit: Payer: MEDICAID | Admitting: Physician Assistant

## 2024-08-18 ENCOUNTER — Encounter: Payer: Self-pay | Admitting: Internal Medicine

## 2024-08-18 VITALS — BP 115/75 | HR 115

## 2024-08-18 DIAGNOSIS — Z9889 Other specified postprocedural states: Secondary | ICD-10-CM

## 2024-08-21 ENCOUNTER — Other Ambulatory Visit (HOSPITAL_COMMUNITY): Payer: Self-pay

## 2024-08-21 ENCOUNTER — Other Ambulatory Visit: Payer: Self-pay | Admitting: Internal Medicine

## 2024-08-21 DIAGNOSIS — H9193 Unspecified hearing loss, bilateral: Secondary | ICD-10-CM

## 2024-08-22 ENCOUNTER — Other Ambulatory Visit: Payer: Self-pay

## 2024-08-22 ENCOUNTER — Other Ambulatory Visit: Payer: Self-pay | Admitting: Internal Medicine

## 2024-08-22 ENCOUNTER — Encounter (INDEPENDENT_AMBULATORY_CARE_PROVIDER_SITE_OTHER): Payer: Self-pay

## 2024-08-22 DIAGNOSIS — I10 Essential (primary) hypertension: Secondary | ICD-10-CM

## 2024-08-23 ENCOUNTER — Other Ambulatory Visit (HOSPITAL_COMMUNITY): Payer: Self-pay

## 2024-08-23 ENCOUNTER — Other Ambulatory Visit: Payer: Self-pay

## 2024-08-23 NOTE — Progress Notes (Signed)
 Patient is a pleasant 48 year old female s/p bilateral breast reduction performed 07/26/2024 Dr. Lowery who presents to clinic for postoperative follow-up.   She was last seen here in clinic on 08/18/2024.  At that time, she was doing well from postoperative standpoint.  She had a less than 1 cm superior T zone wound on the right breast.  Superficial and not draining.  Remainder of exam benign.  Recommended Vaseline to the incisions throughout.  Follow-up as scheduled.  Today, she presents for likely final postoperative encounter.  However, her BP on arrival was markedly low.  She did state that she felt a bit lightheaded, but had driven here independently.  She states that she takes nifedipine , furosemide , and valsartan  for blood pressure management.  She is also on prazosin .  Of note, she takes narcotics for chronic pain.  She states that she took all of her medications this morning, as directed.  However, she has not had anything to eat or drink since yesterday.  She denies any chest pain, difficulty breathing, leg swelling, recent fevers or symptoms suggestive of infection, or syncopal episodes.  On exam, patient was able to stand up and walk to the wall without provoking any new or worsening lightheadedness.  Breasts are soft and symmetric throughout.  Well-healed.  No residual wounds.  No objective leg swelling on exam.  Patient was provided with something to drink and eat here in clinic.  On subsequent BP check, patient remains hypotensive.  However, she does feel much better than she did while coming to the office earlier.    Called and spoke with patient's PCP office.  Was connected with a Associate Professor who stated that the patient would need to go to the ED for evaluation and likely IV hydration.  Her daughter is picking her up to bring her across the street to the emergency department.  Will need close follow-up with PCP afterwards for likely make medication adjustment.  Today's Vitals    08/28/24 0943 08/28/24 0952  BP: (!) 67/36 (!) 81/49  Pulse: 97   SpO2: 97%    There is no height or weight on file to calculate BMI.

## 2024-08-24 ENCOUNTER — Other Ambulatory Visit: Payer: Self-pay

## 2024-08-24 ENCOUNTER — Other Ambulatory Visit (HOSPITAL_COMMUNITY): Payer: Self-pay

## 2024-08-24 MED ORDER — NIFEDIPINE ER 30 MG PO TB24
30.0000 mg | ORAL_TABLET | Freq: Every day | ORAL | 0 refills | Status: DC
  Filled 2024-08-24: qty 90, 90d supply, fill #0

## 2024-08-25 ENCOUNTER — Other Ambulatory Visit (HOSPITAL_COMMUNITY): Payer: Self-pay

## 2024-08-25 ENCOUNTER — Encounter: Payer: Self-pay | Admitting: Student

## 2024-08-28 ENCOUNTER — Other Ambulatory Visit: Payer: Self-pay

## 2024-08-28 ENCOUNTER — Emergency Department (HOSPITAL_COMMUNITY)
Admission: EM | Admit: 2024-08-28 | Discharge: 2024-08-28 | Disposition: A | Discharge status: Home / Self Care | Payer: MEDICAID

## 2024-08-28 ENCOUNTER — Ambulatory Visit: Payer: MEDICAID | Admitting: Physician Assistant

## 2024-08-28 ENCOUNTER — Other Ambulatory Visit: Payer: Self-pay | Admitting: *Deleted

## 2024-08-28 ENCOUNTER — Other Ambulatory Visit (HOSPITAL_COMMUNITY): Payer: Self-pay

## 2024-08-28 ENCOUNTER — Encounter (HOSPITAL_COMMUNITY): Payer: Self-pay

## 2024-08-28 ENCOUNTER — Telehealth: Payer: Self-pay

## 2024-08-28 VITALS — BP 87/55 | HR 97

## 2024-08-28 DIAGNOSIS — Z9889 Other specified postprocedural states: Secondary | ICD-10-CM

## 2024-08-28 DIAGNOSIS — I959 Hypotension, unspecified: Secondary | ICD-10-CM | POA: Diagnosis not present

## 2024-08-28 DIAGNOSIS — R42 Dizziness and giddiness: Secondary | ICD-10-CM | POA: Diagnosis present

## 2024-08-28 DIAGNOSIS — I10 Essential (primary) hypertension: Secondary | ICD-10-CM

## 2024-08-28 LAB — CBG MONITORING, ED: Glucose-Capillary: 122 mg/dL — ABNORMAL HIGH (ref 70–99)

## 2024-08-28 MED ORDER — FUROSEMIDE 20 MG PO TABS
ORAL_TABLET | ORAL | 0 refills | Status: DC
  Filled 2024-08-28: qty 36, 84d supply, fill #0

## 2024-08-28 NOTE — ED Notes (Signed)
 Pt completely assessed by EDP and set to discharge. No RN assessment performed.  Pt provided discharge instructions and prescription information. Pt was given the opportunity to ask questions and questions were answered.

## 2024-08-28 NOTE — ED Notes (Signed)
 Checked cbg it was 122

## 2024-08-28 NOTE — Telephone Encounter (Signed)
 Spoke with Irving Seip , PA [facility]. Patient is for scheduled visit with him Bp is as following . 67/36 Abnormal  81/49 Abnormal  87/55 Abnormal   Irving reports that patient has taken all of her morning medication which includes furosemide  (LASIX ) 20 MG tablet,HYDROcodone -acetaminophen  (NORCO/VICODIN) 5-325 MG , NIFEdipine  (ADALAT  CC) 30 MG 24 hr,prazosin  (MINIPRESS ) 2 MG and valsartan  (DIOVAN ) 160 MG tablet. Advised that patient go the ED for evaluation. Per Irving Patient is not driving and will be taken to ED by daughter.

## 2024-08-28 NOTE — ED Provider Notes (Signed)
 [place] EMERGENCY DEPARTMENT AT [facility] Provider Note   CSN: [id number] Arrival date & time: 08/28/24  1126     Patient presents with: No chief complaint on file.   Theresa Cantrell is a 48 y.o. female.   48 year old female presents for evaluation of hypertension.  She was at her primary care doctor's office and noticed to be very dizzy and her blood pressure in the 80s.  They gave her saline to eat and drink and she is feeling much better.  Blood pressure improved on its own.  She no longer has any symptoms and would like to go home.  She states she forgot to eat and drink this morning as she slept it later than usual.        Prior to Admission medications   Medication Sig Start Date End Date Taking? Authorizing Provider  ARIPiprazole  (ABILIFY ) 15 MG tablet Take 1 tablet (15 mg total) by mouth daily. 08/01/24   Nwoko, Uchenna E, PA  Cholecalciferol  (VITAMIN D ) 50 MCG (2000 UT) tablet Take 1 tablet (2,000 Units total) by mouth daily. 05/08/24   Vicci Barnie NOVAK, MD  cyclobenzaprine  (FLEXERIL ) 10 MG tablet Take 1 tablet (10 mg total) by mouth every 12 (twelve) hours as needed for spasms 03/22/24     cyclobenzaprine  (FLEXERIL ) 10 MG tablet Take 1 tablet (10 mg total) by mouth every 12 (twelve) hours as needed for spasm. 07/18/24     FLUoxetine  (PROZAC ) 20 MG capsule Take 1 capsule (20 mg total) by mouth daily. 08/01/24   Nwoko, Uchenna E, PA  furosemide  (LASIX ) 20 MG tablet Take 1 tablet (20 mg total) by mouth daily. 08/10/24   Oley Bascom GORMAN, NP  furosemide  (LASIX ) 20 MG tablet TAKE 1 TABLET BY MOUTH ON MONDAY, WEDNESDAY AND FRIDAY 08/28/24   Vicci Barnie NOVAK, MD  gabapentin (NEURONTIN) 300 MG capsule Take 300 mg by mouth 3 (three) times daily. 09/09/23   [provider]  HYDROcodone -acetaminophen  (NORCO/VICODIN) 5-325 MG tablet Take 1 tablet by mouth every 8 (eight) hours as needed. 01/13/24   [provider]  HYDROcodone -acetaminophen  (NORCO/VICODIN)  5-325 MG tablet Take 1 tablet by mouth every 8 (eight) hours as needed for severe pain only 07/18/24     HYDROcodone -acetaminophen  (NORCO/VICODIN) 5-325 MG tablet Take 1 tablet by mouth every 8 (eight) hours as needed for severe pain only 08/08/24     hydrOXYzine  (ATARAX ) 10 MG tablet Take 1 tablet (10 mg total) by mouth 3 (three) times daily as needed. 08/01/24   Nwoko, Uchenna E, PA  meloxicam  (MOBIC ) 7.5 MG tablet Take 1 tablet (7.5 mg total) by mouth 2 (two) times daily as needed. 07/11/24     NIFEdipine  (ADALAT  CC) 30 MG 24 hr tablet Take 1 tablet (30 mg total) by mouth daily. 08/23/24     NIFEdipine  (PROCARDIA -XL/NIFEDICAL-XL) 30 MG 24 hr tablet Take 1 tablet by mouth once daily 08/23/24   Vicci Barnie NOVAK, MD  olopatadine  (PATANOL) 0.1 % ophthalmic solution Place 1 drop into both eyes 2 (two) times daily as needed for allergies (as needed for allergy symptoms). 06/26/24   Vicci Barnie NOVAK, MD  omeprazole  (PRILOSEC) 20 MG capsule Take 1 capsule (20 mg total) by mouth 2 (two) times daily before a meal. 07/07/24   Vicci Barnie NOVAK, MD  prazosin  (MINIPRESS ) 2 MG capsule Take 1 capsule (2 mg total) by mouth at bedtime. 08/01/24   Nwoko, Uchenna E, PA  Semaglutide -Weight Management (WEGOVY ) 2.4 MG/0.75ML SOAJ Inject 2.4 mg into  the skin once a week. 02/22/24   Vicci Barnie NOVAK, MD  traZODone  (DESYREL ) 150 MG tablet Take 1 tablet (150 mg total) by mouth at bedtime as needed for sleep. 08/01/24   Nwoko, Uchenna E, PA  valACYclovir  (VALTREX ) 1000 MG tablet Take 1 tablet (1,000 mg total) by mouth daily. 08/14/24   Vicci Barnie NOVAK, MD  valsartan  (DIOVAN ) 160 MG tablet Take 1 tablet (160 mg total) by mouth daily. 06/26/24   Vicci Barnie NOVAK, MD    Allergies: Amlodipine , Citrus, Hydrochlorothiazide , Lamotrigine, and Chlorthalidone     Review of Systems  Constitutional:  Negative for chills and fever.  HENT:  Negative for ear pain and sore throat.   Eyes:  Negative for pain and visual disturbance.   Respiratory:  Negative for cough and shortness of breath.   Cardiovascular:  Negative for chest pain and palpitations.  Gastrointestinal:  Negative for abdominal pain and vomiting.  Genitourinary:  Negative for dysuria and hematuria.  Musculoskeletal:  Negative for arthralgias and back pain.  Skin:  Negative for color change and rash.  Neurological:  Negative for seizures and syncope.  All other systems reviewed and are negative.   Updated Vital Signs BP 114/63 (BP Location: Left Arm)   Pulse (!) 103   Temp 97.7 F (36.5 C)   Resp 14   Ht 5' 5 (1.651 m)   Wt 97.5 kg   SpO2 98%   BMI 35.78 kg/m   Physical Exam Vitals and nursing note reviewed.  Constitutional:      General: She is not in acute distress.    Appearance: She is well-developed.  HENT:     Head: Normocephalic and atraumatic.  Eyes:     Conjunctiva/sclera: Conjunctivae normal.  Cardiovascular:     Rate and Rhythm: Normal rate and regular rhythm.     Heart sounds: No murmur heard. Pulmonary:     Effort: Pulmonary effort is normal. No respiratory distress.     Breath sounds: Normal breath sounds.  Abdominal:     Palpations: Abdomen is soft.     Tenderness: There is no abdominal tenderness.  Musculoskeletal:        General: No swelling.     Cervical back: Neck supple.  Skin:    General: Skin is warm and dry.     Capillary Refill: Capillary refill takes less than 2 seconds.  Neurological:     Mental Status: She is alert.  Psychiatric:        Mood and Affect: Mood normal.     (all labs ordered are listed, but only abnormal results are displayed) Labs Reviewed  CBG MONITORING, ED - Abnormal; Notable for the following components:      Result Value   Glucose-Capillary 122 (*)    All other components within normal limits    EKG: None  Radiology: No results found.   Procedures   Medications Ordered in the ED - No data to display                                  Medical Decision  Making Patient here for hypotension is likely from not eating and drinking and low blood sugar.  Was given something to eat and drink and primary care doctor's office did not she feel much better.  Blood pressure has improved and vitals are stable.  Glucose within normal limits.  She like to go home and I think this is  reasonable.  Advised not to skip meals, stay on her medications as prescribed and obtain close follow-up with primary care.  Advised to return to the ER for new or worsening symptoms.  She feels comfortable with the plan to be discharged.  Problems Addressed: Hypotension, unspecified hypotension type: acute illness or injury  Amount and/or Complexity of Data Reviewed External Data Reviewed: notes.    Details: Office records reviewed from today patient had low blood pressure in the 80s to 90s at the office Labs: ordered. Decision-making details documented in ED Course.    Details: Blood glucose ordered and reviewed by me and within normal limits  Risk OTC drugs. Prescription drug management.     Final diagnoses:  Hypotension, unspecified hypotension type    ED Discharge Orders     None          Gennaro Duwaine CROME, DO 08/28/24 1347

## 2024-08-28 NOTE — ED Triage Notes (Signed)
 Pt sent here from DR office, was at plastic surgeons office for a check up visit and was noted to have low bp of 67 systoic. Pt reports she does feel dizzy, denies any current pain.

## 2024-08-28 NOTE — Discharge Instructions (Signed)
 Make sure you are eating adequately and increase your fluid intake.  Drink increased amounts of water today.

## 2024-08-29 ENCOUNTER — Telehealth: Payer: Self-pay | Admitting: Internal Medicine

## 2024-08-29 NOTE — Telephone Encounter (Signed)
 Called patient,  unable to reach patient . Left voice mail for patient to call back.

## 2024-08-29 NOTE — Telephone Encounter (Signed)
 Patient returned call. Appointment scheduled for 11/7 at 9:10 with Dr. Vicci.

## 2024-08-29 NOTE — Telephone Encounter (Signed)
 Copied from CRM [id number]. Topic: Appointments - Scheduling Inquiry for Clinic >> Aug 28, 2024  4:59 PM Theresa Cantrell wrote: Reason for CRM:   ER follow up - patient is needing to follow up with primary care for low blood pressure. Nothing in clinic available till December - please reach out and schedule patient. Ty # [phone]

## 2024-08-31 ENCOUNTER — Encounter (HOSPITAL_COMMUNITY): Payer: Self-pay

## 2024-08-31 ENCOUNTER — Other Ambulatory Visit: Payer: Self-pay

## 2024-09-01 ENCOUNTER — Other Ambulatory Visit (HOSPITAL_COMMUNITY): Payer: Self-pay

## 2024-09-01 ENCOUNTER — Other Ambulatory Visit: Payer: Self-pay

## 2024-09-04 ENCOUNTER — Encounter: Payer: Self-pay | Admitting: Radiology

## 2024-09-05 ENCOUNTER — Other Ambulatory Visit (HOSPITAL_COMMUNITY): Payer: Self-pay

## 2024-09-05 MED ORDER — HYDROCODONE-ACETAMINOPHEN 5-325 MG PO TABS: 1.0000 | ORAL_TABLET | Freq: Three times a day (TID) | ORAL | 0 refills | Status: AC | PRN

## 2024-09-06 ENCOUNTER — Other Ambulatory Visit (HOSPITAL_COMMUNITY): Payer: Self-pay

## 2024-09-06 ENCOUNTER — Ambulatory Visit: Payer: MEDICAID | Admitting: Orthopaedic Surgery

## 2024-09-08 ENCOUNTER — Ambulatory Visit: Payer: MEDICAID | Attending: Internal Medicine | Admitting: Internal Medicine

## 2024-09-08 ENCOUNTER — Other Ambulatory Visit: Payer: Self-pay

## 2024-09-08 VITALS — BP 127/84 | HR 98 | Temp 97.9°F | Ht 65.0 in

## 2024-09-08 DIAGNOSIS — I10 Essential (primary) hypertension: Secondary | ICD-10-CM

## 2024-09-08 DIAGNOSIS — E66812 Obesity, class 2: Secondary | ICD-10-CM

## 2024-09-08 DIAGNOSIS — Z23 Encounter for immunization: Secondary | ICD-10-CM | POA: Diagnosis not present

## 2024-09-08 DIAGNOSIS — Z6835 Body mass index (BMI) 35.0-35.9, adult: Secondary | ICD-10-CM

## 2024-09-08 MED ORDER — PHENTERMINE HCL 30 MG PO CAPS
30.0000 mg | ORAL_CAPSULE | ORAL | 1 refills | Status: DC
  Filled 2024-09-08: qty 30, 30d supply, fill #0
  Filled 2024-10-13: qty 30, 30d supply, fill #1

## 2024-09-08 NOTE — Progress Notes (Signed)
 Patient ID: Theresa Cantrell, female    DOB: 09-01-1976  MRN: [id number]  CC: Follow-up (ER f/u - hypotensive episode /Discuss GLP-1 - insurance is no longer paying for Wegovy /Shingles vax administered on 09/08/2024 - C.A.)   Subjective: Theresa Cantrell is a 48 y.o. female who presents for chronic ds management. Her concerns today include:  Patient with history of HTN, former tob dep, GERD, obesity,  PTSD, Bipolar 2,  CTS RT hand, family history of breast cancer in her mother, OA knees, OSA on CPAP (through Dr. Carlie).   Discussed the use of AI scribe software for clinical note transcription with the patient, who gave verbal consent to proceed.  History of Present Illness Theresa Cantrell is a 48 year old female with hypertension and obesity who presents for follow-up on blood pressure and weight management.  She experienced an episode of hypotension on October 27th, with a recorded blood pressure of 67/30 mmHg, after being sent to the emergency room from her plastic surgeon's office. On that day, she had taken nifedipine , furosemide , and valsartan , along with pain medication, but had not consumed  food or fluids. She reports increased urination due to the diuretic and believes her low blood pressure was related to dehydration. Since then, she has not experienced further episodes of low blood pressure and has been monitoring her blood pressure at home, reporting readings around 120s/80s mmHg.  She underwent breast reduction surgery in September and is pleased with the results. Her weight was 214 pounds in August and is currently 250 pounds. She was previously on Wegovy  for weight management, but her insurance no longer covers it. She took her last dose over a week ago and has not yet noticed an increase in appetite. She is concerned about potential weight gain affecting her breast size and is trying to manage her portion sizes. She has OSA and is on CPAP. Sleep study was done as home sleep test  through [facility]. I do not see the results on Care EverywhereThis may qualify her for Zepbound but I leared that it has to be moderate to severe OSA and BMI has to be >40.  - She has not started water aerobics as yet stating that she is still healing from her breast reduction surgery.   Her current medications BP meds are include nifedipine  30 mg daily, valsartan  160 mg daily, furosemide  on Monday, Wednesday, and Friday.  She also takes prazosin , prescribed by her behavioral health provider, which may have a blood pressure-lowering effect. No further dizziness or leg swelling.   She has received her flu and pneumonia vaccines.    Patient Active Problem List   Diagnosis Date Noted   Symptomatic mammary hypertrophy 05/15/2024   Back pain 05/15/2024   Gastroesophageal reflux disease with esophagitis without hemorrhage 01/17/2024   Cervical strain, acute, sequela 12/21/2023   Protrusion of lumbar intervertebral disc 03/24/2023   Depression with anxiety 11/30/2022   Partial thickness rotator cuff tear 11/16/2022   Adjustment disorder with mixed anxiety and depressed mood 02/11/2022   Rotator cuff tendinitis, right 11/14/2021   S/P cervical spinal fusion 06/17/2021   COVID-19 vaccine series not completed 12/20/2020   Influenza vaccine refused 11/07/2020   Essential hypertension 11/07/2020   Obesity (BMI 30.0-34.9) 11/07/2020   Tobacco dependence 11/07/2020   Gastroesophageal reflux disease without esophagitis 11/07/2020   History of abnormal cervical Pap smear 11/07/2020   History of bipolar disorder 11/07/2020   Bipolar 2 disorder, major depressive episode (HCC) 06/12/2020  PTSD (post-traumatic stress disorder) 06/12/2020   Chronic pain of left knee 10/05/2019   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 30 tablet 2   Cholecalciferol  (VITAMIN D ) 50 MCG (2000 UT) tablet Take 1 tablet (2,000 Units total) by mouth daily. 100 tablet 1   cyclobenzaprine  (FLEXERIL ) 10 MG tablet Take 1 tablet (10 mg total) by mouth every 12 (twelve) hours as needed for spasm. 60 tablet 1   FLUoxetine  (PROZAC ) 20 MG capsule Take 1 capsule (20 mg total) by mouth daily. 30 capsule 2   furosemide  (LASIX ) 20 MG tablet TAKE 1 TABLET BY MOUTH ON MONDAY, WEDNESDAY AND FRIDAY (Patient not taking: Reported on 09/08/2024) 36 tablet 0   gabapentin (NEURONTIN) 300 MG capsule Take 300 mg by mouth 3 (three) times daily.     HYDROcodone -acetaminophen  (NORCO/VICODIN) 5-325 MG tablet Take 1 tablet by mouth every 8 (eight) hours as needed for severe pain only 90 tablet 0   [START ON 10/01/2024] HYDROcodone -acetaminophen  (NORCO/VICODIN) 5-325 MG tablet Take 1 tablet by mouth every 8 (eight) hours as needed for severe pain only (Patient not taking: Reported on 09/08/2024) 90 tablet 0   hydrOXYzine  (ATARAX ) 10 MG tablet Take 1 tablet (10 mg total) by mouth 3 (three) times daily as needed. (Patient not taking: Reported on 09/08/2024) 75 tablet 1   meloxicam  (MOBIC ) 7.5 MG tablet Take 1 tablet (7.5 mg total) by mouth 2 (two) times daily as needed. 60 tablet 1   NIFEdipine  (ADALAT  CC) 30 MG 24 hr tablet Take 1 tablet (30 mg total) by mouth daily. 90 tablet 0   olopatadine  (PATANOL) 0.1 % ophthalmic solution Place 1 drop into both eyes 2 (two) times daily as needed for allergies (as needed for allergy symptoms). 5 mL 12   omeprazole  (PRILOSEC) 20 MG capsule Take 1 capsule (20 mg total) by mouth 2 (two) times daily before a meal. 180 capsule 0   prazosin  (MINIPRESS ) 2 MG capsule Take 1 capsule (2 mg total) by mouth at bedtime. 30 capsule 2   traZODone  (DESYREL ) 150 MG tablet Take 1 tablet (150 mg total) by mouth at bedtime as needed for sleep. 30 tablet 1    valACYclovir  (VALTREX ) 1000 MG tablet Take 1 tablet (1,000 mg total) by mouth daily. 90 tablet 0   valsartan  (DIOVAN ) 160 MG tablet Take 1 tablet (160 mg total) by mouth daily. 90 tablet 1   No current facility-administered medications on file prior to visit.    Allergies  Allergen Reactions   Amlodipine  Swelling    Lower extremity edema.   Citrus     -Inhaled citrus at a chemical plant  My blood pressure was sky high my throat was itchy I couldn't catch my breath   Hydrochlorothiazide  Other (See Comments)    Cough   Lamotrigine Hives   Chlorthalidone  Rash and Other (See Comments)    Cramping    Social History   Socioeconomic History   Marital status: Single    Spouse name: Not on file   Number of children: 1   Years of education: Not on file   Highest education level: Some college, no degree  Occupational History   Not on file  Tobacco  Use   Smoking status: Former    Current packs/day: 0.25    Types: Cigarettes   Smokeless tobacco: Never  Vaping Use   Vaping status: Never Used  Substance and Sexual Activity   Alcohol use: Yes    Alcohol/week: 2.0 standard drinks of alcohol    Types: 1 Glasses of wine, 1 Cans of beer per week   Drug use: No   Sexual activity: Not Currently    Birth control/protection: Surgical  Other Topics Concern   Not on file  Social History Narrative   Not on file   Social Drivers of Health   Financial Resource Strain: High Risk (09/08/2024)   Overall Financial Resource Strain (CARDIA)    Difficulty of Paying Living Expenses: Very hard  Food Insecurity: Food Insecurity Present (09/08/2024)   Hunger Vital Sign    Worried About Running Out of Food in the Last Year: Often true    Ran Out of Food in the Last Year: Often true  Transportation Needs: No Transportation Needs (09/08/2024)   PRAPARE - Administrator, Civil Service (Medical): No    Lack of Transportation (Non-Medical): No  Physical Activity: Inactive (09/08/2024)    Exercise Vital Sign    Days of Exercise per Week: 0 days    Minutes of Exercise per Session: Not on file  Stress: Stress Concern Present (09/08/2024)   [employer] of Occupational Health - Occupational Stress Questionnaire    Feeling of Stress: Rather much  Social Connections: Moderately Isolated (09/08/2024)   Social Connection and Isolation Panel    Frequency of Communication with Friends and Family: More than three times a week    Frequency of Social Gatherings with Friends and Family: Three times a week    Attends Religious Services: 1 to 4 times per year    Active Member of Clubs or Organizations: No    Attends Banker Meetings: Not on file    Marital Status: Never married  Intimate Partner Violence: Not At Risk (11/26/2023)   Humiliation, Afraid, Rape, and Kick questionnaire    Fear of Current or Ex-Partner: No    Emotionally Abused: No    Physically Abused: No    Sexually Abused: No    Family History  Problem Relation Age of Onset   Hypertension Mother    Cancer Mother        breast   Anxiety disorder Mother    Hypertension Father    Schizophrenia Father    Bipolar disorder Father    Anxiety disorder Father    Parkinson's disease Father    Colon cancer Neg Hx    Colon polyps Neg Hx    Esophageal cancer Neg Hx    Rectal cancer Neg Hx    Stomach cancer Neg Hx     Past Surgical History:  Procedure Laterality Date   ABDOMINAL HYSTERECTOMY     ANTERIOR CERVICAL DECOMP/DISCECTOMY FUSION N/A 06/06/2021   Procedure: C4-5, C5-6 ANTERIOR CERVICAL DISCECTOMY FUSION, ALLOGRAFT, PLATE;  Surgeon: Barbarann Oneil BROCKS, MD;  Location: [facility] OR;  Service: Orthopedics;  Laterality: N/A;   BREAST REDUCTION SURGERY Bilateral 07/26/2024   Procedure: BREAST REDUCTION WITH LIPOSUCTION;  Surgeon: Lowery Estefana RAMAN, DO;  Location: [facility];  Service: Plastics;  Laterality: Bilateral;   CHOLECYSTECTOMY     DILATION AND CURETTAGE OF UTERUS     FOOT SURGERY  Bilateral    two pins in each foot   JOINT REPLACEMENT Left 2018   partial knee  replacement   KNEE SURGERY Left 2018   partial knee replacement   NECK SURGERY  06/06/2021   C4-5, C5-6 ANTERIOR CERVICAL DISCECTOMY FUSION, ALLOGRAFT, PLATE    ROS: Review of Systems Negative except as stated above  PHYSICAL EXAM: BP 127/84 (BP Location: Left Arm, Patient Position: Sitting, Cuff Size: Normal)   Pulse 98   Temp 97.9 F (36.6 C) (Oral)   Ht 5' 5 (1.651 m)   SpO2 98%   BMI 35.78 kg/m   Wt Readings from Last 3 Encounters:  08/28/24 215 lb (97.5 kg)  07/31/24 217 lb (98.4 kg)  07/26/24 208 lb 1.8 oz (94.4 kg)    Physical Exam  General appearance - alert, well appearing, and in no distress Mental status - normal mood, behavior, speech, dress, motor activity, and thought processes Chest - clear to auscultation, no wheezes, rales or rhonchi, symmetric air entry Heart - normal rate, regular rhythm, normal S1, S2, no murmurs, rubs, clicks or gallops Extremities - trace LE edema      Latest Ref Rng & Units 07/31/2024    2:26 PM 06/26/2024   11:03 AM 09/17/2023   11:04 AM  CMP  Glucose 70 - 99 mg/dL 86  91  896   BUN 6 - 24 mg/dL 15  19  17    Creatinine 0.57 - 1.00 mg/dL 9.18  9.15  9.30   Sodium 134 - 144 mmol/L 138  141  145   Potassium 3.5 - 5.2 mmol/L 4.6  4.5  5.1   Chloride 96 - 106 mmol/L 100  104  109   CO2 20 - 29 mmol/L 24  23  24    Calcium 8.7 - 10.2 mg/dL 9.7  9.6  8.8   Total Protein 6.0 - 8.5 g/dL 6.0  6.7  5.5   Total Bilirubin 0.0 - 1.2 mg/dL 0.2  <9.7  <9.7   Alkaline Phos 41 - 116 IU/L 116  115  77   AST 0 - 40 IU/L 22  15  22    ALT 0 - 32 IU/L 25  17  17     Lipid Panel     Component Value Date/Time   CHOL 187 09/17/2023 1104   TRIG 105 09/17/2023 1104   HDL 71 09/17/2023 1104   CHOLHDL 2.6 09/17/2023 1104   LDLCALC 97 09/17/2023 1104    CBC    Component Value Date/Time   WBC 5.1 06/26/2024 1103   WBC 5.4 03/28/2023 1245   RBC 4.36 06/26/2024  1103   RBC 4.50 03/28/2023 1245   HGB 13.2 06/26/2024 1103   HCT 41.3 06/26/2024 1103   PLT 268 06/26/2024 1103   MCV 95 06/26/2024 1103   MCH 30.3 06/26/2024 1103   MCH 30.9 03/28/2023 1245   MCHC 32.0 06/26/2024 1103   MCHC 32.7 03/28/2023 1245   RDW 13.2 06/26/2024 1103   LYMPHSABS 1.5 03/28/2023 1245   MONOABS 0.4 03/28/2023 1245   EOSABS 0.1 03/28/2023 1245   BASOSABS 0.0 03/28/2023 1245    ASSESSMENT AND PLAN: 1. Essential hypertension (Primary) Close to goal. Continue Furosemide  3 x/wk, Valsartan  160 mg daily and Nifedipine  30 mg daily. Let me know if any further low BP episodes. Try to drink several glasses water daily.  2. Class 2 severe obesity due to excess calories with serious comorbidity and body mass index (BMI) of 35.0 to 35.9 in adult Encouraged her to try to eat smaller portions and avoid sugary drinks and sugary snacks. Her insurance no longer pays  for Wegovy . She does have OSA which could qualify her for Zepbound but BMI also needs to be greater than 40 which it is not.  We discussed trying her with phentermine instead.  Advised that the medication can bring about some decrease in appetite.  Advised that the medication can cause a slight bump in blood pressure and palpitations. Recommend checking blood pressures at least twice a week for the next 2 weeks and then sending me the results via MyChart.  Advised to stop the medicine if she develops any palpitations or any other adverse side effects Encouraged her to start water aerobics once she is fully healed from recent breast reduction surgery. - phentermine 30 MG capsule; Take 1 capsule (30 mg total) by mouth every morning.  Dispense: 30 capsule; Refill: 1  3. Need for shingles vaccine Second Shingrix  vaccine given today.   Patient was given the opportunity to ask questions.  Patient verbalized understanding of the plan and was able to repeat key elements of the plan.   This documentation was completed using  Paediatric nurse.  Any transcriptional errors are unintentional.  Orders Placed This Encounter  Procedures   Varicella-zoster vaccine IM     Requested Prescriptions   Signed Prescriptions Disp Refills   phentermine 30 MG capsule 30 capsule 1    Sig: Take 1 capsule (30 mg total) by mouth every morning.    Return in about 2 months (around 11/08/2024).  Barnie Louder, MD, FACP

## 2024-09-08 NOTE — Patient Instructions (Addendum)
 [facility] ENT SPECIALIST [address], [place], [place] [address] Ph# [phone]   VISIT SUMMARY: Today, you came in for a follow-up on your blood pressure and weight management. We discussed your recent episode of low blood pressure, your current medications, and your concerns about weight gain. We also reviewed your sleep apnea management and general health maintenance.  YOUR PLAN: -ESSENTIAL HYPERTENSION: Essential hypertension means high blood pressure without a known secondary cause. Your blood pressure is currently stable with home readings around 120-125/80-85 mmHg. Continue taking your medications (furosemide , nifedipine , and valsartan ) as prescribed. Monitor your blood pressure twice a week for the next two weeks and report the readings. If your blood pressure rises, we may consider increasing your nifedipine  dosage.  -OBESITY, CLASS 2: Class 2 obesity means having a body mass index (BMI) of 35-39.9. Your weight is currently stable at 250 lbs. Since your insurance no longer covers Wegovy , we discussed alternatives like Zepbound and phentermine. You have been prescribed phentermine. Monitor your blood pressure twice a week for the next two weeks and report the readings. If your blood pressure increases significantly, stop taking phentermine and notify us  immediately.  -OBSTRUCTIVE SLEEP APNEA: Obstructive sleep apnea is a condition where your breathing stops and starts during sleep. You are using a CPAP machine, and we are waiting for the results of your recent home sleep study.   -GENERAL HEALTH MAINTENANCE: You have received your flu and pneumonia vaccines. Today, you were given your second shingles vaccine. Keep up with your routine health check-ups and vaccinations to maintain your overall health.  INSTRUCTIONS: Monitor your blood pressure twice a week for the next two weeks and report the readings. If your blood pressure increases significantly, stop taking phentermine and notify us   immediately. We will also review the results of your recent home sleep study to determine if you are eligible for Zepbound.                      Contains text generated by [employer]bridge.                                 Contains text generated by Abridge.

## 2024-09-12 ENCOUNTER — Ambulatory Visit: Payer: MEDICAID | Admitting: Orthopaedic Surgery

## 2024-09-13 ENCOUNTER — Other Ambulatory Visit (HOSPITAL_COMMUNITY): Payer: Self-pay

## 2024-09-13 ENCOUNTER — Other Ambulatory Visit: Payer: Self-pay

## 2024-09-13 ENCOUNTER — Telehealth (INDEPENDENT_AMBULATORY_CARE_PROVIDER_SITE_OTHER): Payer: MEDICAID | Admitting: Physician Assistant

## 2024-09-13 DIAGNOSIS — F4310 Post-traumatic stress disorder, unspecified: Secondary | ICD-10-CM

## 2024-09-13 DIAGNOSIS — F3181 Bipolar II disorder: Secondary | ICD-10-CM | POA: Diagnosis not present

## 2024-09-13 DIAGNOSIS — F419 Anxiety disorder, unspecified: Secondary | ICD-10-CM

## 2024-09-13 MED ORDER — PREDNISONE 10 MG (21) PO TBPK
ORAL_TABLET | ORAL | 0 refills | Status: DC
  Filled 2024-09-13 (×2): qty 21, 6d supply, fill #0

## 2024-09-14 ENCOUNTER — Other Ambulatory Visit (HOSPITAL_COMMUNITY): Payer: Self-pay

## 2024-09-17 ENCOUNTER — Encounter (HOSPITAL_COMMUNITY): Payer: Self-pay | Admitting: Physician Assistant

## 2024-09-17 MED ORDER — PRAZOSIN HCL 2 MG PO CAPS: 2.0000 mg | ORAL_CAPSULE | Freq: Every day | ORAL | 2 refills | Status: DC

## 2024-09-17 MED ORDER — FLUOXETINE HCL 20 MG PO CAPS: 20.0000 mg | ORAL_CAPSULE | Freq: Every day | ORAL | 2 refills | Status: DC

## 2024-09-17 MED ORDER — HYDROXYZINE HCL 10 MG PO TABS: 10.0000 mg | ORAL_TABLET | Freq: Three times a day (TID) | ORAL | 1 refills | Status: DC | PRN

## 2024-09-17 MED ORDER — ARIPIPRAZOLE 15 MG PO TABS: 15.0000 mg | ORAL_TABLET | Freq: Every day | ORAL | 2 refills | Status: DC

## 2024-09-17 MED ORDER — TRAZODONE HCL 150 MG PO TABS: 150.0000 mg | ORAL_TABLET | Freq: Every evening | ORAL | 1 refills | Status: DC | PRN

## 2024-09-19 ENCOUNTER — Ambulatory Visit: Payer: MEDICAID | Admitting: Skilled Nursing Facility1

## 2024-09-21 ENCOUNTER — Ambulatory Visit (INDEPENDENT_AMBULATORY_CARE_PROVIDER_SITE_OTHER): Payer: MEDICAID | Admitting: Physician Assistant

## 2024-09-21 ENCOUNTER — Encounter: Payer: Self-pay | Admitting: Internal Medicine

## 2024-09-21 ENCOUNTER — Encounter (INDEPENDENT_AMBULATORY_CARE_PROVIDER_SITE_OTHER): Payer: Self-pay | Admitting: Physician Assistant

## 2024-09-21 VITALS — BP 97/61 | HR 118 | Temp 97.8°F | Ht 65.0 in | Wt 205.0 lb

## 2024-09-21 DIAGNOSIS — H6121 Impacted cerumen, right ear: Secondary | ICD-10-CM

## 2024-09-21 DIAGNOSIS — H9193 Unspecified hearing loss, bilateral: Secondary | ICD-10-CM

## 2024-09-21 NOTE — Progress Notes (Signed)
 Dear Dr. Vicci, Here is my assessment for our mutual patient, Theresa Cantrell. Thank you for allowing me the opportunity to care for your patient. Please do not hesitate to contact me should you have any other questions. Sincerely, Chyrl Cohen PA-C  Otolaryngology Clinic Note Referring provider: Dr. Vicci HPI:  Theresa Cantrell is a 48 y.o. female kindly referred by Dr. Vicci   Discussed the use of AI scribe software for clinical note transcription with the patient, who gave verbal consent to proceed.  History of Present Illness    Theresa Cantrell is a 48 year old female who presents with decreased hearing.  She has been experiencing decreased hearing, which she attributes to her previous work environment where she was exposed to loud noise for about twelve years. The hearing loss has been gradually worsening, possibly due to aging. She has not had a hearing test since leaving that job.  Her daughter and others have noticed that she often has the television volume turned up very high. She believes both ears are affected equally. No history of repeated ear infections or trauma to her ears.  She does not report any significant difficulties in day-to-day activities due to her hearing loss, but it is more noticeable in family settings where others comment on the loudness of the TV. No seasonal allergies.  She has not had any recent hearing tests and has not used any ear drops or other treatments for earwax removal, although she recalls her daughter having a similar issue with earwax buildup in the past.      Independent Review of Additional Tests or Records:  none   PMH/Meds/All/SocHx/FamHx/ROS:   Past Medical History:  Diagnosis Date   Anxiety    Arthritis    Bipolar 1 disorder (HCC)    Depression    GERD (gastroesophageal reflux disease)    Hypertension    PTSD (post-traumatic stress disorder)    Sleep apnea      Past Surgical History:  Procedure Laterality Date    ABDOMINAL HYSTERECTOMY     ANTERIOR CERVICAL DECOMP/DISCECTOMY FUSION N/A 06/06/2021   Procedure: C4-5, C5-6 ANTERIOR CERVICAL DISCECTOMY FUSION, ALLOGRAFT, PLATE;  Surgeon: Barbarann Oneil BROCKS, MD;  Location: [facility] OR;  Service: Orthopedics;  Laterality: N/A;   BREAST REDUCTION SURGERY Bilateral 07/26/2024   Procedure: BREAST REDUCTION WITH LIPOSUCTION;  Surgeon: Lowery [name], DO;  Location: [facility];  Service: Plastics;  Laterality: Bilateral;   CHOLECYSTECTOMY     DILATION AND CURETTAGE OF UTERUS     FOOT SURGERY Bilateral    two pins in each foot   JOINT REPLACEMENT Left 2018   partial knee replacement   KNEE SURGERY Left 2018   partial knee replacement   NECK SURGERY  06/06/2021   C4-5, C5-6 ANTERIOR CERVICAL DISCECTOMY FUSION, ALLOGRAFT, PLATE    Family History  Problem Relation Age of Onset   Hypertension Mother    Cancer Mother        breast   Anxiety disorder Mother    Hypertension Father    Schizophrenia Father    Bipolar disorder Father    Anxiety disorder Father    Parkinson's disease Father    Colon cancer Neg Hx    Colon polyps Neg Hx    Esophageal cancer Neg Hx    Rectal cancer Neg Hx    Stomach cancer Neg Hx      Social Connections: Moderately Isolated (09/08/2024)   Social Connection and Isolation Panel    Frequency of  Communication with Friends and Family: More than three times a week    Frequency of Social Gatherings with Friends and Family: Three times a week    Attends Religious Services: 1 to 4 times per year    Active Member of Clubs or Organizations: No    Attends Engineer, Structural: Not on file    Marital Status: Never married      Current Outpatient Medications:    ARIPiprazole  (ABILIFY ) 15 MG tablet, Take 1 tablet (15 mg total) by mouth daily., Disp: 30 tablet, Rfl: 2   Cholecalciferol  (VITAMIN D ) 50 MCG (2000 UT) tablet, Take 1 tablet (2,000 Units total) by mouth daily., Disp: 100 tablet, Rfl: 1   cyclobenzaprine   (FLEXERIL ) 10 MG tablet, Take 1 tablet (10 mg total) by mouth every 12 (twelve) hours as needed for spasm., Disp: 60 tablet, Rfl: 1   FLUoxetine  (PROZAC ) 20 MG capsule, Take 1 capsule (20 mg total) by mouth daily., Disp: 30 capsule, Rfl: 2   furosemide  (LASIX ) 20 MG tablet, TAKE 1 TABLET BY MOUTH ON MONDAY, WEDNESDAY AND FRIDAY (Patient not taking: Reported on 09/08/2024), Disp: 36 tablet, Rfl: 0   gabapentin  (NEURONTIN ) 300 MG capsule, Take 300 mg by mouth 3 (three) times daily., Disp: , Rfl:    HYDROcodone -acetaminophen  (NORCO/VICODIN) 5-325 MG tablet, Take 1 tablet by mouth every 8 (eight) hours as needed for severe pain only, Disp: 90 tablet, Rfl: 0   [START ON 10/01/2024] HYDROcodone -acetaminophen  (NORCO/VICODIN) 5-325 MG tablet, Take 1 tablet by mouth every 8 (eight) hours as needed for severe pain only (Patient not taking: Reported on 09/08/2024), Disp: 90 tablet, Rfl: 0   hydrOXYzine  (ATARAX ) 10 MG tablet, Take 1 tablet (10 mg total) by mouth 3 (three) times daily as needed., Disp: 75 tablet, Rfl: 1   meloxicam  (MOBIC ) 7.5 MG tablet, Take 1 tablet (7.5 mg total) by mouth 2 (two) times daily as needed., Disp: 60 tablet, Rfl: 1   NIFEdipine  (ADALAT  CC) 30 MG 24 hr tablet, Take 1 tablet (30 mg total) by mouth daily., Disp: 90 tablet, Rfl: 0   olopatadine  (PATANOL) 0.1 % ophthalmic solution, Place 1 drop into both eyes 2 (two) times daily as needed for allergies (as needed for allergy symptoms)., Disp: 5 mL, Rfl: 12   omeprazole  (PRILOSEC) 20 MG capsule, Take 1 capsule (20 mg total) by mouth 2 (two) times daily before a meal., Disp: 180 capsule, Rfl: 0   phentermine  30 MG capsule, Take 1 capsule (30 mg total) by mouth every morning., Disp: 30 capsule, Rfl: 1   prazosin  (MINIPRESS ) 2 MG capsule, Take 1 capsule (2 mg total) by mouth at bedtime., Disp: 30 capsule, Rfl: 2   predniSONE  (STERAPRED UNI-PAK 21 TAB) 10 MG (21) TBPK tablet, Take by mouth as directed on package., Disp: 21 tablet, Rfl: 0    traZODone  (DESYREL ) 150 MG tablet, Take 1 tablet (150 mg total) by mouth at bedtime as needed for sleep., Disp: 30 tablet, Rfl: 1   valACYclovir  (VALTREX ) 1000 MG tablet, Take 1 tablet (1,000 mg total) by mouth daily., Disp: 90 tablet, Rfl: 0   valsartan  (DIOVAN ) 160 MG tablet, Take 1 tablet (160 mg total) by mouth daily., Disp: 90 tablet, Rfl: 1   Physical Exam:   BP 97/61   Pulse (!) 118   Temp 97.8 F (36.6 C)   Ht 5' 5 (1.651 m)   Wt 205 lb (93 kg)   SpO2 92%   BMI 34.11 kg/m   Pertinent Findings  CN II-XII grossly intact Right EAC with cerumen impaction, Left EAC clear TM intact with well pneumatized middle ear spaces Anterior rhinoscopy: Septum left deviation ; bilateral inferior turbinates with no hypertrophy No lesions of oral cavity/oropharynx; dentition WNL No obviously palpable neck masses/lymphadenopathy/thyromegaly No respiratory distress or stridor   Seprately Identifiable Procedures:  Procedure: bilateral ear microscopy and cerumen removal using microscope (CPT [id number]) - Mod 25 Pre-procedure diagnosis: unilateral cerumen impaction right external auditory canal Post-procedure diagnosis: same Indication: bilateral cerumen impaction; given patient's otologic complaints and history as well as for improved and comprehensive examination of external ear and tympanic membrane, bilateral otologic examination using microscope was performed and impacted cerumen removed  Procedure: Patient was placed semi-recumbent. Both ear canals were examined using the microscope with findings above. Cerumen removed from the right external auditory canal using suction and currette with improvement in EAC examination and patency. Left: EAC was patent. TM was intact . Middle ear was aerated. Drainage: none Right: EAC was patent. TM was intact . Middle ear was aerated . Drainage: none Patient tolerated the procedure well.   Impression & Plans:  Theresa Cantrell is a 48 y.o. female with the  following   Assessment and Plan    Bilateral hearing loss Chronic bilateral hearing loss, likely age-related. No significant impact on daily activities. Differential includes age-related hearing loss versus other causes, pending hearing test results. - Ordered hearing test to assess degree of hearing loss. - Will discuss potential need for hearing aids based on test results  Cerumen impaction, right ear Significant cerumen impaction in the right ear, obstructing view of the eardrum. Cerumen removal performed. - Performed cerumen removal from right ear. - Advised regular ear cleanings every 6-12 months to prevent cerumen buildup. - Instructed to avoid using Q-tips or ear drops for cerumen removal.       - f/u 6-12 month follow up   Thank you for allowing me the opportunity to care for your patient. Please do not hesitate to contact me should you have any other questions.  Sincerely, Chyrl Cohen PA-C [facility] Phone: [phone] Fax: [phone]  09/22/2024, 8:58 AM

## 2024-09-25 ENCOUNTER — Other Ambulatory Visit (HOSPITAL_COMMUNITY): Payer: Self-pay

## 2024-09-25 ENCOUNTER — Other Ambulatory Visit: Payer: Self-pay

## 2024-09-26 ENCOUNTER — Other Ambulatory Visit: Payer: Self-pay

## 2024-09-26 ENCOUNTER — Other Ambulatory Visit (HOSPITAL_COMMUNITY): Payer: Self-pay

## 2024-09-26 MED ORDER — GABAPENTIN 300 MG PO CAPS
300.0000 mg | ORAL_CAPSULE | Freq: Three times a day (TID) | ORAL | 5 refills | Status: AC
  Filled 2024-09-26: qty 90, 30d supply, fill #0
  Filled 2024-11-05: qty 90, 30d supply, fill #1

## 2024-09-26 MED ORDER — MELOXICAM 7.5 MG PO TABS
7.5000 mg | ORAL_TABLET | Freq: Two times a day (BID) | ORAL | 1 refills | Status: AC | PRN
  Filled 2024-09-26: qty 60, 30d supply, fill #0
  Filled 2024-11-05: qty 60, 30d supply, fill #1

## 2024-09-26 MED ORDER — HYDROCODONE-ACETAMINOPHEN 5-325 MG PO TABS
1.0000 | ORAL_TABLET | Freq: Three times a day (TID) | ORAL | 0 refills | Status: AC | PRN
  Filled 2024-09-26: qty 90, 30d supply, fill #0

## 2024-09-26 MED ORDER — CYCLOBENZAPRINE HCL 10 MG PO TABS
10.0000 mg | ORAL_TABLET | Freq: Two times a day (BID) | ORAL | 1 refills | Status: AC | PRN
  Filled 2024-09-26: qty 60, 30d supply, fill #0
  Filled 2024-11-05: qty 60, 30d supply, fill #1

## 2024-10-02 ENCOUNTER — Ambulatory Visit (INDEPENDENT_AMBULATORY_CARE_PROVIDER_SITE_OTHER): Payer: MEDICAID | Admitting: Mental Health

## 2024-10-02 DIAGNOSIS — F3181 Bipolar II disorder: Secondary | ICD-10-CM

## 2024-10-02 NOTE — Progress Notes (Signed)
 THERAPIST PROGRESS NOTE Virtual Visit via Video Note  I connected with Kellie GORMAN Blush on 10/02/24 at  4:00 PM EST by a video enabled telemedicine application and verified that I am speaking with the correct person using two identifiers.  Location: Patient: home address on file Provider: office   I discussed the limitations of evaluation and management by telemedicine and the availability of in person appointments. The patient expressed understanding and agreed to proceed.  I discussed the assessment and treatment plan with the patient. The patient was provided an opportunity to ask questions and all were answered. The patient agreed with the plan and demonstrated an understanding of the instructions.   The patient was advised to call back or seek an in-person evaluation if the symptoms worsen or if the condition fails to improve as anticipated.  I provided 35 minutes of non-face-to-face time during this encounter.   Ty Bernice Savant, [facility]   Session Time: 4:08 pm ( 35 minutes)  Participation Level: Active  Behavioral Response: CasualAlertWNL  Type of Therapy: Individual Therapy  Treatment Goals addressed: STG: Krystiana will increase management of moods and stressors AEB development of x 3 effective coping skills with ability to process stressors in effective manner per self report within the next 90 days  Active     Anxiety     LTG: Antoninette will increase overall level of functioning AEB improved daily functioning per self-report (Progressing)     Start:  11/30/22    Expected End:  01/09/25         STG: Lyana will increase management of moods and stressors AEB development of x 3 effective coping skills with ability to process stressors in effective manner per self report within the next 90 days.  (Progressing)     Start:  11/30/22    Expected End:  01/09/25      10/02/24 update: Shares to have made progress with goals. Denies current mood concerns but notes concern for  future dips in mood with ongoing psychosocial stressor of finances and medical concerns and reduce mobility. Shares coping skills of engaging in world puzzles, spending time with natural supports and watching T.V. Notes concern for ongoing ability to maintain mood stability. Will continue to monitor    Goal Note     Update: 10/02/2024: Shares to be doing well.          Encourage Deiondra to take psychotropic medication(s) as prescribed     Start:  11/30/22         Review results of GAD-7 with Angelle to track progress     Start:  11/30/22         Work with Kellie to track symptoms, triggers, and/or skill use through a mood chart, diary card, or journal     Start:  11/30/22         Perform psychoeducation regarding anxiety disorders     Start:  11/30/22         Provide Keyla with educational information and reading material on anxiety, its causes, and symptoms.      Start:  11/30/22         Work with patient individually to identify the major components of a recent episode of anxiety: physical symptoms, major thoughts and images, and major behaviors they experienced     Start:  11/30/22         Work with Kellie to identify 3 personal goals for managing their anxiety to work on during current treatment.  Start:  11/30/22         Create a weekly activity schedule     Start:  11/30/22            ProgressTowards Goals: Progressing  Interventions: Supportive  Summary: MADALENE MICKLER is a 48 y.o. female who presents with dx of bipolar disorder, PTSD and anxiety. Presents for session alert and oriented; mood and affect stable; adequate. Speech clear and coherent at normal rate and tone. Engaged and receptive to interventions. Shares for moods to be stable at this time and denies excessive highs or lows. Reports ongoing interaction with various medical providers with ongoing ailments causing high degree of pain. Shares event in having to present to ED. Shares upcoming  disability hearing and shares feelings of hope. Shares coping with medical stress with spending time with family and friends, who she reports are very supportive of her as well as watching T.V. Notes progress with goals and reports desire to continue to work on goal with concern for mood fluctuates with ongoing medical concerns. Denies safety concerns.   Suicidal/Homicidal: Nowithout intent/plan  Therapist Response: Therapist engaged Caprice in tele-therapy session. Completed check in and assessed for current level of functioning, sxs management and level of stressors. Explored ability to engage coping skills and ability to navigate stressors. Provided support and encouragement; validated feelings. Active empathic listening. Explored and reviewed use of coping and access to needed supports. Reviewed treatment plan and progress in therapy. Reviewed session and provided follow up  Plan: Return again in  x 7 weeks.  Diagnosis: Bipolar 2 disorder, major depressive episode (HCC)  Collaboration of Care: Other None  Patient/Guardian was advised Release of Information must be obtained prior to any record release in order to collaborate their care with an outside provider. Patient/Guardian was advised if they have not already done so to contact the registration department to sign all necessary forms in order for us  to release information regarding their care.   Consent: Patient/Guardian gives verbal consent for treatment and assignment of benefits for services provided during this visit. Patient/Guardian expressed understanding and agreed to proceed.   Ty Asal Romney, [facility] 10/02/2024

## 2024-10-09 ENCOUNTER — Other Ambulatory Visit: Payer: Self-pay | Admitting: Internal Medicine

## 2024-10-09 ENCOUNTER — Other Ambulatory Visit: Payer: Self-pay

## 2024-10-09 DIAGNOSIS — K2100 Gastro-esophageal reflux disease with esophagitis, without bleeding: Secondary | ICD-10-CM

## 2024-10-09 MED ORDER — OMEPRAZOLE 20 MG PO CPDR
20.0000 mg | DELAYED_RELEASE_CAPSULE | Freq: Two times a day (BID) | ORAL | 0 refills | Status: AC
  Filled 2024-10-09: qty 180, 90d supply, fill #0

## 2024-10-13 ENCOUNTER — Other Ambulatory Visit: Payer: Self-pay

## 2024-10-24 ENCOUNTER — Other Ambulatory Visit (HOSPITAL_COMMUNITY): Payer: Self-pay

## 2024-10-24 MED ORDER — HYDROCODONE-ACETAMINOPHEN 5-325 MG PO TABS
1.0000 | ORAL_TABLET | Freq: Three times a day (TID) | ORAL | 0 refills | Status: AC | PRN
  Filled 2024-11-23: qty 90, 30d supply, fill #0

## 2024-10-31 ENCOUNTER — Ambulatory Visit: Payer: MEDICAID | Admitting: Internal Medicine

## 2024-11-05 ENCOUNTER — Other Ambulatory Visit (HOSPITAL_COMMUNITY): Payer: Self-pay

## 2024-11-06 ENCOUNTER — Other Ambulatory Visit: Payer: Self-pay

## 2024-11-06 ENCOUNTER — Other Ambulatory Visit (HOSPITAL_COMMUNITY): Payer: Self-pay

## 2024-11-07 ENCOUNTER — Other Ambulatory Visit (HOSPITAL_COMMUNITY): Payer: Self-pay

## 2024-11-08 ENCOUNTER — Other Ambulatory Visit (HOSPITAL_COMMUNITY): Payer: Self-pay

## 2024-11-08 NOTE — Progress Notes (Unsigned)
 Medical Nutrition Therapy  Appointment Start time:  *** Appointment End time:***  Primary concerns today: weight management  Referral diagnosis: Class 2 severe obesity due to excess calories with serious comorbidity and body mass index (BMI) of 35.0 to 35.9 in adult [facility])  Preferred learning style: no preference indicated Learning readiness: contemplating   NUTRITION ASSESSMENT   Clinical Medical Hx:  Past Medical History:  Diagnosis Date   Anxiety    Arthritis    Bipolar 1 disorder (HCC)    Depression    GERD (gastroesophageal reflux disease)    Hypertension    PTSD (post-traumatic stress disorder)    Sleep apnea     Medications:  Current Outpatient Medications:    ARIPiprazole  (ABILIFY ) 15 MG tablet, Take 1 tablet (15 mg total) by mouth daily., Disp: 30 tablet, Rfl: 2   Cholecalciferol  (VITAMIN D ) 50 MCG (2000 UT) tablet, Take 1 tablet (2,000 Units total) by mouth daily., Disp: 100 tablet, Rfl: 1   cyclobenzaprine  (FLEXERIL ) 10 MG tablet, Take 1 tablet (10 mg total) by mouth every 12 (twelve) hours as needed for spasm., Disp: 60 tablet, Rfl: 1   cyclobenzaprine  (FLEXERIL ) 10 MG tablet, Take 1 tablet (10 mg total) by mouth every 12 (twelve) hours as needed for spasm, Disp: 60 tablet, Rfl: 1   FLUoxetine  (PROZAC ) 20 MG capsule, Take 1 capsule (20 mg total) by mouth daily., Disp: 30 capsule, Rfl: 2   furosemide  (LASIX ) 20 MG tablet, TAKE 1 TABLET BY MOUTH ON MONDAY, WEDNESDAY AND FRIDAY (Patient not taking: Reported on 09/08/2024), Disp: 36 tablet, Rfl: 0   gabapentin  (NEURONTIN ) 300 MG capsule, Take 300 mg by mouth 3 (three) times daily., Disp: , Rfl:    gabapentin  (NEURONTIN ) 300 MG capsule, Take 1 capsule (300 mg total) by mouth 3 (three) times daily., Disp: 90 capsule, Rfl: 5   HYDROcodone -acetaminophen  (NORCO/VICODIN) 5-325 MG tablet, Take 1 tablet by mouth every 8 (eight) hours as needed for severe pain only, Disp: 90 tablet, Rfl: 0   HYDROcodone -acetaminophen  (NORCO/VICODIN)  5-325 MG tablet, Take 1 tablet by mouth every 8 (eight) hours as needed for severe pain only, Disp: 90 tablet, Rfl: 0   HYDROcodone -acetaminophen  (NORCO/VICODIN) 5-325 MG tablet, Take 1 tablet by mouth every 8 (eight) hours as needed for severe pain only, Disp: 90 tablet, Rfl: 0   hydrOXYzine  (ATARAX ) 10 MG tablet, Take 1 tablet (10 mg total) by mouth 3 (three) times daily as needed., Disp: 75 tablet, Rfl: 1   meloxicam  (MOBIC ) 7.5 MG tablet, Take 1 tablet (7.5 mg total) by mouth 2 (two) times daily as needed., Disp: 60 tablet, Rfl: 1   NIFEdipine  (ADALAT  CC) 30 MG 24 hr tablet, Take 1 tablet (30 mg total) by mouth daily., Disp: 90 tablet, Rfl: 0   olopatadine  (PATANOL) 0.1 % ophthalmic solution, Place 1 drop into both eyes 2 (two) times daily as needed for allergies (as needed for allergy symptoms)., Disp: 5 mL, Rfl: 12   omeprazole  (PRILOSEC) 20 MG capsule, Take 1 capsule (20 mg total) by mouth 2 (two) times daily before a meal., Disp: 180 capsule, Rfl: 0   phentermine  30 MG capsule, Take 1 capsule (30 mg total) by mouth every morning., Disp: 30 capsule, Rfl: 1   prazosin  (MINIPRESS ) 2 MG capsule, Take 1 capsule (2 mg total) by mouth at bedtime., Disp: 30 capsule, Rfl: 2   predniSONE  (STERAPRED UNI-PAK 21 TAB) 10 MG (21) TBPK tablet, Take by mouth as directed on package., Disp: 21 tablet, Rfl: 0  traZODone  (DESYREL ) 150 MG tablet, Take 1 tablet (150 mg total) by mouth at bedtime as needed for sleep., Disp: 30 tablet, Rfl: 1   valACYclovir  (VALTREX ) 1000 MG tablet, Take 1 tablet (1,000 mg total) by mouth daily., Disp: 90 tablet, Rfl: 0   valsartan  (DIOVAN ) 160 MG tablet, Take 1 tablet (160 mg total) by mouth daily., Disp: 90 tablet, Rfl: 1  Labs:  Lab Results  Component Value Date   HGBA1C 5.7 (H) 04/24/2024   Notable Signs/Symptoms:  Wt Readings from Last 3 Encounters:  09/21/24 205 lb (93 kg)  08/28/24 215 lb (97.5 kg)  07/31/24 217 lb (98.4 kg)   Lifestyle & Dietary Hx ***   4/11,  6/6, 8/15 Pt presents today alone for follow up. Pt reports avoids sugary sweetened beverages 95% of the time and would like to aim for 100%. Pt reports selecting juice couple times weekly and this was strongly discouraged. Pt reports typical intake of 2-3 meals stating skipping a meal twice weekly.  Pt reports her appetite is up and down with wegovy .  Pt reports walking to mailbox every other day stating for an unknown amount of time.  Pt reports back pain with lifting restriction of 20# currently.   Pt reports she continue to limit fried foods to twice monthly.  Pt reports she has home health 2 hours on 5 days weekly.  All Pt's questions were answered during this encounter.    Estimated daily fluid intake: ~67 ounces daily Supplements: daily MVI  Sleep: 5-8 hours nightly with CPAP  Stress / self-care: medium; therapy, talk to family/friends, play with the dog  Current average weekly physical activity: walking as tolerated   24-Hr Dietary Recall First Meal: 1-2 slices of wheat bread with egg, sausage or bacon, cheese or 1 banana Snack: none Second Meal: burger on bun, lettuce, tomato, mayo, fries, water or baked chicken breast, broccoli with cheese, potatoes Snack: none Third Meal: lasagana, corn  Snack: none Beverages: water, gatorade zero, sweet tea, orange juice, diet soda  NUTRITION DIAGNOSIS  NB-1.1 Food and nutrition-related knowledge deficit As related to limited nutrition related education.  As evidenced by Pt's reports and dietary recall-continue, improving  Learning Style & Readiness for Change Teaching method utilized: Visual & Auditory  Demonstrated degree of understanding via: Teach Back  Barriers to learning/adherence to lifestyle change: limited physical activity  Goals Established by Pt Avoid skipping meals  Aim for full omission of sugar sweetened beverages   MONITORING & EVALUATION Dietary intake, weekly physical activity  Next Steps  Patient is to  PRN

## 2024-11-09 ENCOUNTER — Encounter: Payer: Self-pay | Admitting: Internal Medicine

## 2024-11-09 ENCOUNTER — Ambulatory Visit: Payer: MEDICAID | Attending: Internal Medicine | Admitting: Internal Medicine

## 2024-11-09 ENCOUNTER — Other Ambulatory Visit (HOSPITAL_COMMUNITY): Payer: Self-pay

## 2024-11-09 ENCOUNTER — Other Ambulatory Visit: Payer: Self-pay

## 2024-11-09 VITALS — BP 146/92 | HR 98 | Temp 98.2°F | Ht 65.0 in | Wt 212.0 lb

## 2024-11-09 DIAGNOSIS — E66812 Obesity, class 2: Secondary | ICD-10-CM

## 2024-11-09 DIAGNOSIS — I10 Essential (primary) hypertension: Secondary | ICD-10-CM

## 2024-11-09 DIAGNOSIS — Z6835 Body mass index (BMI) 35.0-35.9, adult: Secondary | ICD-10-CM

## 2024-11-09 DIAGNOSIS — Z8619 Personal history of other infectious and parasitic diseases: Secondary | ICD-10-CM

## 2024-11-09 MED ORDER — SPIRONOLACTONE 25 MG PO TABS
12.5000 mg | ORAL_TABLET | Freq: Every day | ORAL | 3 refills | Status: DC
  Filled 2024-11-09: qty 30, 60d supply, fill #0

## 2024-11-09 MED ORDER — VALSARTAN 160 MG PO TABS
160.0000 mg | ORAL_TABLET | Freq: Every day | ORAL | 1 refills | Status: AC
  Filled 2024-11-09: qty 90, 90d supply, fill #0

## 2024-11-09 MED ORDER — VALACYCLOVIR HCL 1 G PO TABS: 1000.0000 mg | ORAL_TABLET | Freq: Every day | ORAL | 0 refills | Status: AC

## 2024-11-09 NOTE — Progress Notes (Signed)
 "   Patient ID: Theresa Cantrell, female    DOB: 10/28/1976  MRN: [id number]  CC: Hypertension (HTN f/u./Discuss furosemide  - concern about low BP readings per specialists /Reports fall around thanksgiving - leg gave out /Already received flu vax)   Subjective: Theresa Cantrell is a 49 y.o. female who presents for chronic ds management. Her chronic medical issues include:  Patient with history of HTN, former tob dep, GERD, obesity,  PTSD, Bipolar 2,  CTS RT hand, family history of breast cancer in her mother, OA knees, OSA on CPAP (through Dr. Carlie).   Discussed the use of AI scribe software for clinical note transcription with the patient, who gave verbal consent to proceed.  History of Present Illness Theresa Cantrell is a 49 year old female with hypertension who presents for a two-month follow-up for weight and blood pressure management.  Obesity: She was previously on phentermine  37.5 mg, which was discontinued in November or December due to elevated blood pressure readings. Blood pressure readings have fluctuated, with some readings as high as 138/90 and 136/93, accompanied by headaches. Her blood pressure drops significantly when taking furosemide , with the lowest recorded at 97/36, leading to episodes of symptomatic hypotension. Current medications include valsartan  160 mg daily and nifedipine  30 mg daily.  In the past she has not tolerated amlodipine , hydrochlorothiazide  or chlorthalidone .  She checks her blood pressure a couple of times a week, with recent readings of 124/95 and 131/86. She experiences headaches when her blood pressure is elevated.  She has been managing her weight, noting a decrease from 215 lbs to 212 lbs over the past two months. She is concerned about weight gain after stopping phentermine  and mentions the challenge of eating healthy due to the cost of food. She and her daughter are trying to prepare healthier meals at home and limit eating out due to financial  constraints.  She has a history of genital herpes and takes Valtrex  daily to prevent flare-ups. Request RF.  She is planning to start  aquatic therapy twice a week next month, following improvement from a recent injection to her back. She had previously been unable to participate due to recovery from breast reduction surgery but has been released from the plastic surgeon.    Patient Active Problem List   Diagnosis Date Noted   Symptomatic mammary hypertrophy 05/15/2024   Back pain 05/15/2024   Gastroesophageal reflux disease with esophagitis without hemorrhage 01/17/2024   Cervical strain, acute, sequela 12/21/2023   Protrusion of lumbar intervertebral disc 03/24/2023   Depression with anxiety 11/30/2022   Partial thickness rotator cuff tear 11/16/2022   Adjustment disorder with mixed anxiety and depressed mood 02/11/2022   Rotator cuff tendinitis, right 11/14/2021   S/P cervical spinal fusion 06/17/2021   COVID-19 vaccine series not completed 12/20/2020   Influenza vaccine refused 11/07/2020   Essential hypertension 11/07/2020   Obesity (BMI 30.0-34.9) 11/07/2020   Tobacco dependence 11/07/2020   Gastroesophageal reflux disease without esophagitis 11/07/2020   History of abnormal cervical Pap smear 11/07/2020   History of bipolar disorder 11/07/2020   Bipolar 2 disorder, major depressive episode (HCC) 06/12/2020   PTSD (post-traumatic stress disorder) 06/12/2020   Chronic pain of left knee 10/05/2019   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   Neuropathy 02/07/2014   Hot flashes 02/07/2014   Weight loss 02/07/2014   Anxiety      Medications Ordered Prior to Encounter[1]  Allergies[2]  Social History   Socioeconomic History  Marital status: Single    Spouse name: Not on file   Number of children: 1   Years of education: Not on file   Highest education level: Some college, no degree  Occupational History   Not on file  Tobacco Use   Smoking  status: Former    Current packs/day: 0.25    Types: Cigarettes   Smokeless tobacco: Never  Vaping Use   Vaping status: Never Used  Substance and Sexual Activity   Alcohol use: Yes    Alcohol/week: 2.0 standard drinks of alcohol    Types: 1 Glasses of wine, 1 Cans of beer per week   Drug use: No   Sexual activity: Not Currently    Birth control/protection: Surgical  Other Topics Concern   Not on file  Social History Narrative   Not on file   Social Drivers of Health   Tobacco Use: Medium Risk (11/09/2024)   Patient History    Smoking Tobacco Use: Former    Smokeless Tobacco Use: Never    Passive Exposure: Not on file  Financial Resource Strain: High Risk (09/08/2024)   Overall Financial Resource Strain (CARDIA)    Difficulty of Paying Living Expenses: Very hard  Food Insecurity: Food Insecurity Present (09/08/2024)   Epic    Worried About Programme Researcher, Broadcasting/film/video in the Last Year: Often true    Ran Out of Food in the Last Year: Often true  Transportation Needs: No Transportation Needs (09/08/2024)   Epic    Lack of Transportation (Medical): No    Lack of Transportation (Non-Medical): No  Physical Activity: Inactive (09/08/2024)   Exercise Vital Sign    Days of Exercise per Week: 0 days    Minutes of Exercise per Session: Not on file  Stress: Stress Concern Present (09/08/2024)   [employer] of Occupational Health - Occupational Stress Questionnaire    Feeling of Stress: Rather much  Social Connections: Moderately Isolated (09/08/2024)   Social Connection and Isolation Panel    Frequency of Communication with Friends and Family: More than three times a week    Frequency of Social Gatherings with Friends and Family: Three times a week    Attends Religious Services: 1 to 4 times per year    Active Member of Clubs or Organizations: No    Attends Banker Meetings: Not on file    Marital Status: Never married  Intimate Partner Violence: Not At Risk (11/26/2023)    Humiliation, Afraid, Rape, and Kick questionnaire    Fear of Current or Ex-Partner: No    Emotionally Abused: No    Physically Abused: No    Sexually Abused: No  Depression (PHQ2-9): High Risk (09/13/2024)   Depression (PHQ2-9)    PHQ-2 Score: 12  Alcohol Screen: Low Risk (11/26/2023)   Alcohol Screen    Last Alcohol Screening Score (AUDIT): 0  Housing: High Risk (09/08/2024)   Epic    Unable to Pay for Housing in the Last Year: Yes    Number of Times Moved in the Last Year: 0    Homeless in the Last Year: No  Utilities: At Risk (11/26/2023)   [employer] Utilities    Threatened with loss of utilities: Yes  Health Literacy: Inadequate Health Literacy (11/26/2023)   B1300 Health Literacy    Frequency of need for help with medical instructions: Sometimes    Family History  Problem Relation Age of Onset   Hypertension Mother    Cancer Mother  breast   Anxiety disorder Mother    Hypertension Father    Schizophrenia Father    Bipolar disorder Father    Anxiety disorder Father    Parkinson's disease Father    Colon cancer Neg Hx    Colon polyps Neg Hx    Esophageal cancer Neg Hx    Rectal cancer Neg Hx    Stomach cancer Neg Hx     Past Surgical History:  Procedure Laterality Date   ABDOMINAL HYSTERECTOMY     ANTERIOR CERVICAL DECOMP/DISCECTOMY FUSION N/A 06/06/2021   Procedure: C4-5, C5-6 ANTERIOR CERVICAL DISCECTOMY FUSION, ALLOGRAFT, PLATE;  Surgeon: Barbarann Oneil BROCKS, MD;  Location: [facility] OR;  Service: Orthopedics;  Laterality: N/A;   BREAST REDUCTION SURGERY Bilateral 07/26/2024   Procedure: BREAST REDUCTION WITH LIPOSUCTION;  Surgeon: Lowery Estefana RAMAN, DO;  Location: [facility];  Service: Plastics;  Laterality: Bilateral;   CHOLECYSTECTOMY     DILATION AND CURETTAGE OF UTERUS     FOOT SURGERY Bilateral    two pins in each foot   JOINT REPLACEMENT Left 2018   partial knee replacement   KNEE SURGERY Left 2018   partial knee replacement   NECK SURGERY   06/06/2021   C4-5, C5-6 ANTERIOR CERVICAL DISCECTOMY FUSION, ALLOGRAFT, PLATE    ROS: Review of Systems Negative except as stated above  PHYSICAL EXAM: BP (!) 146/92   Pulse 98   Temp 98.2 F (36.8 C) (Oral)   Ht 5' 5 (1.651 m)   Wt 212 lb (96.2 kg)   SpO2 97%   BMI 35.28 kg/m   Wt Readings from Last 3 Encounters:  11/09/24 212 lb (96.2 kg)  09/21/24 205 lb (93 kg)  08/28/24 215 lb (97.5 kg)    Physical Exam  General appearance - alert, well appearing, older AAF and in no distress Mental status - normal mood, behavior, speech, dress, motor activity, and thought processes Neck - supple, no significant adenopathy Chest - clear to auscultation, no wheezes, rales or rhonchi, symmetric air entry Heart - normal rate, regular rhythm, normal S1, S2, no murmurs, rubs, clicks or gallops Extremities - peripheral pulses normal, no pedal edema, no clubbing or cyanosis     02/24/2024   10:45 AM 04/24/2024    2:05 PM 06/26/2024    9:38 AM 09/08/2024    9:41 AM 11/09/2024    9:57 AM  Fall Risk  Falls in the past year? 0 1 1 1 1   Was there an injury with Fall? 0  1  1  1   0  Fall Risk Category Calculator 0 3 3 3 2   Patient at Risk for Falls Due to No Fall Risks History of fall(s);Impaired mobility;Impaired balance/gait;Orthopedic patient History of fall(s) History of fall(s) History of fall(s)  Fall risk Follow up Falls evaluation completed Falls evaluation completed Falls evaluation completed Falls evaluation completed Falls evaluation completed     Data saved with a previous flowsheet row definition       Latest Ref Rng & Units 07/31/2024    2:26 PM 06/26/2024   11:03 AM 09/17/2023   11:04 AM  CMP  Glucose 70 - 99 mg/dL 86  91  896   BUN 6 - 24 mg/dL 15  19  17    Creatinine 0.57 - 1.00 mg/dL 9.18  9.15  9.30   Sodium 134 - 144 mmol/L 138  141  145   Potassium 3.5 - 5.2 mmol/L 4.6  4.5  5.1   Chloride 96 - 106  mmol/L 100  104  109   CO2 20 - 29 mmol/L 24  23  24    Calcium 8.7  - 10.2 mg/dL 9.7  9.6  8.8   Total Protein 6.0 - 8.5 g/dL 6.0  6.7  5.5   Total Bilirubin 0.0 - 1.2 mg/dL 0.2  <9.7  <9.7   Alkaline Phos 41 - 116 IU/L 116  115  77   AST 0 - 40 IU/L 22  15  22    ALT 0 - 32 IU/L 25  17  17     Lipid Panel     Component Value Date/Time   CHOL 187 09/17/2023 1104   TRIG 105 09/17/2023 1104   HDL 71 09/17/2023 1104   CHOLHDL 2.6 09/17/2023 1104   LDLCALC 97 09/17/2023 1104    CBC    Component Value Date/Time   WBC 5.1 06/26/2024 1103   WBC 5.4 03/28/2023 1245   RBC 4.36 06/26/2024 1103   RBC 4.50 03/28/2023 1245   HGB 13.2 06/26/2024 1103   HCT 41.3 06/26/2024 1103   PLT 268 06/26/2024 1103   MCV 95 06/26/2024 1103   MCH 30.3 06/26/2024 1103   MCH 30.9 03/28/2023 1245   MCHC 32.0 06/26/2024 1103   MCHC 32.7 03/28/2023 1245   RDW 13.2 06/26/2024 1103   LYMPHSABS 1.5 03/28/2023 1245   MONOABS 0.4 03/28/2023 1245   EOSABS 0.1 03/28/2023 1245   BASOSABS 0.0 03/28/2023 1245    ASSESSMENT AND PLAN: 1. Essential hypertension (Primary) Not at goal with goal being 130/80 or lower. Stop furosemide . Continue valsartan  160 mg daily.  Continue nifedipine .  Spironolactone  25 mg half tablet daily.  Return to the lab in 1 week after being on this medicine for BMP - valsartan  (DIOVAN ) 160 MG tablet; Take 1 tablet (160 mg total) by mouth daily.  Dispense: 90 tablet; Refill: 1 - spironolactone  (ALDACTONE ) 25 MG tablet; Take 0.5 tablets (12.5 mg total) by mouth daily.  Dispense: 30 tablet; Refill: 3 - Basic Metabolic Panel; Future  2. Class 2 severe obesity due to excess calories with serious comorbidity and body mass index (BMI) of 35.0 to 35.9 in adult Phentermine  discontinued due to increase BP on it  Insurance changes affected [employer]  access; no longer qualifies. - Referred to medical weight management program. Pt agreeable to this - Continue home cooking, limit portions, avoid sugary drinks and snacks. - Amb Ref to Medical Weight Management  3.  History of herpes genitalis RF Valtrex .   Patient was given the opportunity to ask questions.  Patient verbalized understanding of the plan and was able to repeat key elements of the plan.   This documentation was completed using Paediatric nurse.  Any transcriptional errors are unintentional.  Orders Placed This Encounter  Procedures   Basic Metabolic Panel   Amb Ref to Medical Weight Management     Requested Prescriptions   Signed Prescriptions Disp Refills   valsartan  (DIOVAN ) 160 MG tablet 90 tablet 1    Sig: Take 1 tablet (160 mg total) by mouth daily.   spironolactone  (ALDACTONE ) 25 MG tablet 30 tablet 3    Sig: Take 0.5 tablets (12.5 mg total) by mouth daily.    Return in about 4 months (around 03/09/2025) for Appt with [facility] in 3 wks for BP check.  Barnie Louder, MD, FACP     [1]  Current Outpatient Medications on File Prior to Visit  Medication Sig Dispense Refill   ARIPiprazole  (ABILIFY ) 15 MG tablet Take 1  tablet (15 mg total) by mouth daily. 30 tablet 2   Cholecalciferol  (VITAMIN D ) 50 MCG (2000 UT) tablet Take 1 tablet (2,000 Units total) by mouth daily. 100 tablet 1   cyclobenzaprine  (FLEXERIL ) 10 MG tablet Take 1 tablet (10 mg total) by mouth every 12 (twelve) hours as needed for spasm 60 tablet 1   FLUoxetine  (PROZAC ) 20 MG capsule Take 1 capsule (20 mg total) by mouth daily. 30 capsule 2   gabapentin  (NEURONTIN ) 300 MG capsule Take 300 mg by mouth 3 (three) times daily.     gabapentin  (NEURONTIN ) 300 MG capsule Take 1 capsule (300 mg total) by mouth 3 (three) times daily. 90 capsule 5   HYDROcodone -acetaminophen  (NORCO/VICODIN) 5-325 MG tablet Take 1 tablet by mouth every 8 (eight) hours as needed for severe pain only 90 tablet 0   HYDROcodone -acetaminophen  (NORCO/VICODIN) 5-325 MG tablet Take 1 tablet by mouth every 8 (eight) hours as needed for severe pain only 90 tablet 0   hydrOXYzine  (ATARAX ) 10 MG tablet Take 1 tablet (10 mg total) by  mouth 3 (three) times daily as needed. 75 tablet 1   meloxicam  (MOBIC ) 7.5 MG tablet Take 1 tablet (7.5 mg total) by mouth 2 (two) times daily as needed. 60 tablet 1   olopatadine  (PATANOL) 0.1 % ophthalmic solution Place 1 drop into both eyes 2 (two) times daily as needed for allergies (as needed for allergy symptoms). 5 mL 12   omeprazole  (PRILOSEC) 20 MG capsule Take 1 capsule (20 mg total) by mouth 2 (two) times daily before a meal. 180 capsule 0   prazosin  (MINIPRESS ) 2 MG capsule Take 1 capsule (2 mg total) by mouth at bedtime. 30 capsule 2   traZODone  (DESYREL ) 150 MG tablet Take 1 tablet (150 mg total) by mouth at bedtime as needed for sleep. 30 tablet 1   valACYclovir  (VALTREX ) 1000 MG tablet Take 1 tablet (1,000 mg total) by mouth daily. 90 tablet 0   NIFEdipine  (ADALAT  CC) 30 MG 24 hr tablet Take 1 tablet (30 mg total) by mouth daily. 90 tablet 0   No current facility-administered medications on file prior to visit.  [2]  Allergies Allergen Reactions   Amlodipine  Swelling    Lower extremity edema.   Citrus     -Inhaled citrus at a chemical plant  My blood pressure was sky high my throat was itchy I couldn't catch my breath   Hydrochlorothiazide  Other (See Comments)    Cough   Lamotrigine Hives   Chlorthalidone  Rash and Other (See Comments)    Cramping   "

## 2024-11-10 ENCOUNTER — Ambulatory Visit (INDEPENDENT_AMBULATORY_CARE_PROVIDER_SITE_OTHER): Payer: MEDICAID | Admitting: Audiology

## 2024-11-10 ENCOUNTER — Other Ambulatory Visit (HOSPITAL_COMMUNITY): Payer: Self-pay

## 2024-11-10 DIAGNOSIS — H93293 Other abnormal auditory perceptions, bilateral: Secondary | ICD-10-CM

## 2024-11-10 DIAGNOSIS — Z0110 Encounter for examination of ears and hearing without abnormal findings: Secondary | ICD-10-CM

## 2024-11-10 NOTE — Progress Notes (Signed)
" °  [address] [place], [place] [address] [phone]  Audiological Evaluation    Name: Theresa Cantrell     DOB:   [birth year]      MRN:   [id number]                                                                                     Service Date: 11/10/2024     Accompanied by: self    Patient comes today after Reyes Cohen, PA-C sent a referral for a hearing evaluation due to concerns with hearing loss.   Symptoms Yes Details  Hearing loss  [x]  Daughter complains she turns the TV loud  Tinnitus  []    Ear pain/ infections/pressure  []    Balance problems  []    Noise exposure history  [x]  Occupational for ten years - thinks was told to have hearing loss  Previous ear surgeries  []    Family history of hearing loss  []  Recalls uncle had tinnitus  Amplification  []    Other  []      Otoscopy: Right ear: Clear external ear canal and notable landmarks visualized on the tympanic membrane. Left ear:  Clear external ear canal and notable landmarks visualized on the tympanic membrane.  Tympanometry: Right ear: Type A - Normal external ear canal volume with normal middle ear pressure and normal tympanic membrane compliance. Findings are consistent with normal middle ear function. Left ear: Type A - Normal external ear canal volume with normal middle ear pressure and normal tympanic membrane compliance. Findings are consistent with normal middle ear function.  Hearing Evaluation The hearing test results were completed under headphones and results are deemed to be of good reliability. Test technique:  conventional    Pure tone Audiometry: Both ears - Normal hearing from [phone].   Speech Audiometry: Right ear- Speech Reception Threshold (SRT) was obtained at 5 dBHL. Left ear-Speech Reception Threshold (SRT) was obtained at 5 dBHL.   Word Recognition Score Tested using NU-6 (recorded) Right ear: 100% was obtained at a presentation level of 50 dBHL with contralateral  masking which is deemed as  excellent. Left ear: 100% was obtained at a presentation level of 50 dBHL with contralateral masking which is deemed as  excellent.   Impression: There is not a significant difference in pure-tone thresholds between ears., There is not a significant difference in the word recognition score in between ears.    Recommendations: Follow up with ENT as scheduled. Return for a hearing evaluation if concerns with hearing changes arise or per MD recommendation.   Theresa Cantrell, AUD  "

## 2024-11-15 ENCOUNTER — Encounter: Payer: MEDICAID | Admitting: Dietician

## 2024-11-15 ENCOUNTER — Encounter (HOSPITAL_COMMUNITY): Payer: Self-pay | Admitting: Physician Assistant

## 2024-11-15 ENCOUNTER — Telehealth (HOSPITAL_COMMUNITY): Payer: MEDICAID | Admitting: Physician Assistant

## 2024-11-15 DIAGNOSIS — F419 Anxiety disorder, unspecified: Secondary | ICD-10-CM | POA: Diagnosis not present

## 2024-11-15 DIAGNOSIS — F3181 Bipolar II disorder: Secondary | ICD-10-CM

## 2024-11-15 DIAGNOSIS — F4310 Post-traumatic stress disorder, unspecified: Secondary | ICD-10-CM

## 2024-11-15 DIAGNOSIS — Z79899 Other long term (current) drug therapy: Secondary | ICD-10-CM

## 2024-11-15 DIAGNOSIS — Z6835 Body mass index (BMI) 35.0-35.9, adult: Secondary | ICD-10-CM

## 2024-11-15 MED ORDER — TRAZODONE HCL 150 MG PO TABS
150.0000 mg | ORAL_TABLET | Freq: Every evening | ORAL | 2 refills | Status: AC | PRN
  Filled 2024-11-15: qty 30, 30d supply, fill #0

## 2024-11-15 MED ORDER — FLUOXETINE HCL 20 MG PO CAPS
20.0000 mg | ORAL_CAPSULE | Freq: Every day | ORAL | 2 refills | Status: AC
  Filled 2024-11-15: qty 30, 30d supply, fill #0

## 2024-11-15 MED ORDER — ARIPIPRAZOLE 15 MG PO TABS
15.0000 mg | ORAL_TABLET | Freq: Every day | ORAL | 2 refills | Status: AC
  Filled 2024-11-15: qty 30, 30d supply, fill #0

## 2024-11-15 MED ORDER — PRAZOSIN HCL 2 MG PO CAPS
2.0000 mg | ORAL_CAPSULE | Freq: Every day | ORAL | 2 refills | Status: AC
  Filled 2024-11-15: qty 30, 30d supply, fill #0

## 2024-11-15 MED ORDER — HYDROXYZINE HCL 10 MG PO TABS
10.0000 mg | ORAL_TABLET | Freq: Three times a day (TID) | ORAL | 1 refills | Status: AC | PRN
  Filled 2024-11-15: qty 75, 25d supply, fill #0

## 2024-11-15 NOTE — Progress Notes (Signed)
 [facility] MD/PA/NP OP Progress Note  Virtual Visit via Video Note  I connected with Theresa Cantrell on 11/15/24 at  4:30 PM EST by a video enabled telemedicine application and verified that I am speaking with the correct person using two identifiers.  Location: Patient: Home Provider: Clinic   I discussed the limitations of evaluation and management by telemedicine and the availability of in person appointments. The patient expressed understanding and agreed to proceed.  Follow Up Instructions:  I discussed the assessment and treatment plan with the patient. The patient was provided an opportunity to ask questions and all were answered. The patient agreed with the plan and demonstrated an understanding of the instructions.   The patient was advised to call back or seek an in-person evaluation if the symptoms worsen or if the condition fails to improve as anticipated.  I provided 15 minutes of non-face-to-face time during this encounter.  Reginia FORBES Bolster, PA    11/15/2024 4:30 PM KAIAH HOSEA  MRN:  [id number]  Chief Complaint:  Chief Complaint  Patient presents with   Follow-up   Medication Refill   HPI:   Theresa Cantrell is a 49 year old female with a past psychiatric history significant for bipolar 2 disorder (major depressive episode), PTSD, and anxiety who presents to [facility] via virtual video visit for follow-up and medication management.  Patient is currently being managed on the medications:  Trazodone  150 mg at bedtime Abilify  15 mg daily Fluoxetine  20 mg daily Hydroxyzine  10 mg 3 times daily as needed Prazosin  2 mg at bedtime  Patient presents to the encounter stating that  she has been taking her medications regularly and denies experiencing any adverse side effects.  Patient endorses some depression on occasion but denies her symptoms being impactful.  Patient rates her depression a 3 out of 10 with 10 being most severe.   Patient endorses depressive episodes 1 day/week.  Patient endorses the following depressive symptoms: feelings of sadness (improving), crying spells (very minimal), decreased energy, and irritability.  Patient denies lack of motivation, decreased concentration, feelings of guilt/worthlessness, or hopelessness.  Patient also endorses anxiety and rates her anxiety a 4-5 out of 10.  Contributing factors to her anxiety include her ongoing back problems, difficulty getting her bills paid, and being unable to do much around the house.  Patient denies any other stressors or concerns at this time.  A PHQ-9 screen was performed with the patient scoring an 11.  A GAD-7 screen was also performed with patient scoring a 15.  Patient is alert and oriented x 4, calm, cooperative, and fully engaged in conversation during the encounter.  Patient endorses pretty good mood.  Patient exhibits depressed mood with appropriate affect.  Patient denies suicidal or homicidal ideations.  She further denies auditory or visual hallucinations and does not appear to be responding to internal/external stimuli.  Patient endorses fair sleep and receives on average 5 to 6 hours of sleep per night.  Patient endorses good appetite and eats on average 3 meals per day.  Patient denies alcohol consumption, tobacco use, or illicit drug use.  Visit Diagnosis:    ICD-10-CM   1. Long term current use of antipsychotic medication  Z79.899     2. Anxiety  F41.9 hydrOXYzine  (ATARAX ) 10 MG tablet    FLUoxetine  (PROZAC ) 20 MG capsule    3. Bipolar 2 disorder, major depressive episode (HCC)  F31.81 ARIPiprazole  (ABILIFY ) 15 MG tablet    traZODone  (DESYREL ) 150  MG tablet    FLUoxetine  (PROZAC ) 20 MG capsule    4. PTSD (post-traumatic stress disorder)  F43.10 prazosin  (MINIPRESS ) 2 MG capsule    FLUoxetine  (PROZAC ) 20 MG capsule      Past Psychiatric History:  Disorder (major depressive episode) Anxiety PTSD  Past Medical History:  Past  Medical History:  Diagnosis Date   Anxiety    Arthritis    Bipolar 1 disorder (HCC)    Depression    GERD (gastroesophageal reflux disease)    Hypertension    PTSD (post-traumatic stress disorder)    Sleep apnea     Past Surgical History:  Procedure Laterality Date   ABDOMINAL HYSTERECTOMY     ANTERIOR CERVICAL DECOMP/DISCECTOMY FUSION N/A 06/06/2021   Procedure: C4-5, C5-6 ANTERIOR CERVICAL DISCECTOMY FUSION, ALLOGRAFT, PLATE;  Surgeon: Barbarann Oneil BROCKS, MD;  Location: [facility] OR;  Service: Orthopedics;  Laterality: N/A;   BREAST REDUCTION SURGERY Bilateral 07/26/2024   Procedure: BREAST REDUCTION WITH LIPOSUCTION;  Surgeon: Lowery Estefana RAMAN, DO;  Location: [facility];  Service: Plastics;  Laterality: Bilateral;   CHOLECYSTECTOMY     DILATION AND CURETTAGE OF UTERUS     FOOT SURGERY Bilateral    two pins in each foot   JOINT REPLACEMENT Left 2018   partial knee replacement   KNEE SURGERY Left 2018   partial knee replacement   NECK SURGERY  06/06/2021   C4-5, C5-6 ANTERIOR CERVICAL DISCECTOMY FUSION, ALLOGRAFT, PLATE    Family Psychiatric History:  Father - bipolar disorder, schizophrenia. Patient reports that her father was on medications but does not remember what medications he was on Mother - depression, anxiety.  Patient reports that her mother was on medications but does not remember what medications she was on   Family history of suicide: Patient reports that her father attempted multiple times in the past Family history of homicide: Patient denies Family history of substance abuse: Patient reports that her father abused crack cocaine, marijuana, and alcohol  Family History:  Family History  Problem Relation Age of Onset   Hypertension Mother    Cancer Mother        breast   Anxiety disorder Mother    Hypertension Father    Schizophrenia Father    Bipolar disorder Father    Anxiety disorder Father    Parkinson's disease Father    Colon cancer Neg Hx     Colon polyps Neg Hx    Esophageal cancer Neg Hx    Rectal cancer Neg Hx    Stomach cancer Neg Hx     Social History:  Social History   Socioeconomic History   Marital status: Single    Spouse name: Not on file   Number of children: 1   Years of education: Not on file   Highest education level: Some college, no degree  Occupational History   Not on file  Tobacco Use   Smoking status: Former    Current packs/day: 0.25    Types: Cigarettes   Smokeless tobacco: Never  Vaping Use   Vaping status: Never Used  Substance and Sexual Activity   Alcohol use: Yes    Alcohol/week: 2.0 standard drinks of alcohol    Types: 1 Glasses of wine, 1 Cans of beer per week   Drug use: No   Sexual activity: Not Currently    Birth control/protection: Surgical  Other Topics Concern   Not on file  Social History Narrative   Not on file   Social Drivers  of Health   Tobacco Use: Medium Risk (11/15/2024)   Patient History    Smoking Tobacco Use: Former    Smokeless Tobacco Use: Never    Passive Exposure: Not on file  Financial Resource Strain: High Risk (09/08/2024)   Overall Financial Resource Strain (CARDIA)    Difficulty of Paying Living Expenses: Very hard  Food Insecurity: Food Insecurity Present (09/08/2024)   Epic    Worried About Programme Researcher, Broadcasting/film/video in the Last Year: Often true    Ran Out of Food in the Last Year: Often true  Transportation Needs: No Transportation Needs (09/08/2024)   Epic    Lack of Transportation (Medical): No    Lack of Transportation (Non-Medical): No  Physical Activity: Inactive (09/08/2024)   Exercise Vital Sign    Days of Exercise per Week: 0 days    Minutes of Exercise per Session: Not on file  Stress: Stress Concern Present (09/08/2024)   [employer] of Occupational Health - Occupational Stress Questionnaire    Feeling of Stress: Rather much  Social Connections: Moderately Isolated (09/08/2024)   Social Connection and Isolation Panel    Frequency  of Communication with Friends and Family: More than three times a week    Frequency of Social Gatherings with Friends and Family: Three times a week    Attends Religious Services: 1 to 4 times per year    Active Member of Clubs or Organizations: No    Attends Banker Meetings: Not on file    Marital Status: Never married  Depression (PHQ2-9): High Risk (11/15/2024)   Depression (PHQ2-9)    PHQ-2 Score: 11  Alcohol Screen: Low Risk (11/26/2023)   Alcohol Screen    Last Alcohol Screening Score (AUDIT): 0  Housing: High Risk (09/08/2024)   Epic    Unable to Pay for Housing in the Last Year: Yes    Number of Times Moved in the Last Year: 0    Homeless in the Last Year: No  Utilities: At Risk (11/26/2023)   AHC Utilities    Threatened with loss of utilities: Yes  Health Literacy: Inadequate Health Literacy (11/26/2023)   B1300 Health Literacy    Frequency of need for help with medical instructions: Sometimes    Allergies:  Allergies  Allergen Reactions   Amlodipine  Swelling    Lower extremity edema.   Citrus     -Inhaled citrus at a chemical plant  My blood pressure was sky high my throat was itchy I couldn't catch my breath   Hydrochlorothiazide  Other (See Comments)    Cough   Lamotrigine Hives   Chlorthalidone  Rash and Other (See Comments)    Cramping    Metabolic Disorder Labs: Lab Results  Component Value Date   HGBA1C 5.7 (H) 04/24/2024   No results found for: PROLACTIN Lab Results  Component Value Date   CHOL 187 09/17/2023   TRIG 105 09/17/2023   HDL 71 09/17/2023   CHOLHDL 2.6 09/17/2023   LDLCALC 97 09/17/2023   LDLCALC 86 11/07/2020   Lab Results  Component Value Date   TSH 0.711 11/23/2022   TSH 0.484 03/31/2018    Therapeutic Level Labs: No results found for: LITHIUM No results found for: VALPROATE No results found for: CBMZ  Current Medications: Current Outpatient Medications  Medication Sig Dispense Refill    ARIPiprazole  (ABILIFY ) 15 MG tablet Take 1 tablet (15 mg total) by mouth daily. 30 tablet 2   Cholecalciferol  (VITAMIN D ) 50 MCG (2000 UT) tablet Take 1  tablet (2,000 Units total) by mouth daily. 100 tablet 1   cyclobenzaprine  (FLEXERIL ) 10 MG tablet Take 1 tablet (10 mg total) by mouth every 12 (twelve) hours as needed for spasm 60 tablet 1   FLUoxetine  (PROZAC ) 20 MG capsule Take 1 capsule (20 mg total) by mouth daily. 30 capsule 2   gabapentin  (NEURONTIN ) 300 MG capsule Take 300 mg by mouth 3 (three) times daily.     gabapentin  (NEURONTIN ) 300 MG capsule Take 1 capsule (300 mg total) by mouth 3 (three) times daily. 90 capsule 5   HYDROcodone -acetaminophen  (NORCO/VICODIN) 5-325 MG tablet Take 1 tablet by mouth every 8 (eight) hours as needed for severe pain only 90 tablet 0   HYDROcodone -acetaminophen  (NORCO/VICODIN) 5-325 MG tablet Take 1 tablet by mouth every 8 (eight) hours as needed for severe pain only 90 tablet 0   hydrOXYzine  (ATARAX ) 10 MG tablet Take 1 tablet (10 mg total) by mouth 3 (three) times daily as needed. 75 tablet 1   meloxicam  (MOBIC ) 7.5 MG tablet Take 1 tablet (7.5 mg total) by mouth 2 (two) times daily as needed. 60 tablet 1   NIFEdipine  (ADALAT  CC) 30 MG 24 hr tablet Take 1 tablet (30 mg total) by mouth daily. 90 tablet 0   olopatadine  (PATANOL) 0.1 % ophthalmic solution Place 1 drop into both eyes 2 (two) times daily as needed for allergies (as needed for allergy symptoms). 5 mL 12   omeprazole  (PRILOSEC) 20 MG capsule Take 1 capsule (20 mg total) by mouth 2 (two) times daily before a meal. 180 capsule 0   prazosin  (MINIPRESS ) 2 MG capsule Take 1 capsule (2 mg total) by mouth at bedtime. 30 capsule 2   spironolactone  (ALDACTONE ) 25 MG tablet Take 0.5 tablets (12.5 mg total) by mouth daily. 30 tablet 3   traZODone  (DESYREL ) 150 MG tablet Take 1 tablet (150 mg total) by mouth at bedtime as needed for sleep. 30 tablet 2   valACYclovir  (VALTREX ) 1000 MG tablet Take 1 tablet  (1,000 mg total) by mouth daily. 90 tablet 0   valsartan  (DIOVAN ) 160 MG tablet Take 1 tablet (160 mg total) by mouth daily. 90 tablet 1   No current facility-administered medications for this visit.     Musculoskeletal: Strength & Muscle Tone: within normal limits Gait & Station: ataxic, patient ambulates with a cane Patient leans: N/A  Psychiatric Specialty Exam: Review of Systems  Psychiatric/Behavioral:  Positive for dysphoric mood and sleep disturbance. Negative for decreased concentration, hallucinations, self-injury and suicidal ideas. The patient is nervous/anxious. The patient is not hyperactive.     There were no vitals taken for this visit.There is no height or weight on file to calculate BMI.  General Appearance: Casual  Eye Contact:  Good  Speech:  Clear and Coherent and Normal Rate  Volume:  Normal  Mood:  Anxious and Depressed  Affect:  Appropriate  Thought Process:  Coherent, Goal Directed, and Descriptions of Associations: Intact  Orientation:  Full (Time, Place, and Person)  Thought Content: WDL   Suicidal Thoughts:  No  Homicidal Thoughts:  No  Memory:  Immediate;   Good Recent;   Good Remote;   Good  Judgement:  Good  Insight:  Good  Psychomotor Activity:  Normal  Concentration:  Concentration: Good and Attention Span: Good  Recall:  Good  Fund of Knowledge: Good  Language: Good  Akathisia:  No  Handed:  Right  AIMS (if indicated): not done  Assets:  Communication Skills Desire for Improvement  Financial Resources/Insurance Housing Social Support Transportation Vocational/Educational  ADL's:  Intact  Cognition: WNL  Sleep:  Fair   Screenings: AIMS    Flowsheet Row Video Visit from 11/15/2024 in [facility] Video Visit from 09/13/2024 in [facility] Video Visit from 08/01/2024 in [facility] Video Visit from 05/30/2024 in [facility]  Video Visit from 03/28/2024 in [facility]  AIMS Total Score 0 0 0 0 0   AUDIT    Flowsheet Row Office Visit from 05/17/2023 in [facility] [place]. [facility] Most recent reading at 04/02/2023  8:48 AM Appointment from 04/06/2023 in [facility] - [facility] Dept Of [place]. [facility] Most recent reading at 04/02/2023  8:48 AM  Alcohol Use Disorder Identification Test Final Score (AUDIT) 3 3   GAD-7    Flowsheet Row Video Visit from 11/15/2024 in [facility] Video Visit from 09/13/2024 in [facility] Office Visit from 09/08/2024 in [facility] [place] - A Dept Of [place]. [facility] Video Visit from 08/01/2024 in [facility] Office Visit from 06/26/2024 in [facility] [place] - A Dept Of [place]. [facility]  Total GAD-7 Score 15 12 12 15 21    PHQ2-9    Flowsheet Row Video Visit from 11/15/2024 in [facility] Video Visit from 09/13/2024 in [facility] Office Visit from 09/08/2024 in [facility] [place] - A Dept Of [place]. [facility] Video Visit from 08/01/2024 in [facility] Office Visit from 06/26/2024 in [facility] [place] - A Dept Of [place]. [facility]  PHQ-2 Total Score 3 2 4 5 5   PHQ-9 Total Score 11 12 15 17 16    Flowsheet Row Video Visit from 11/15/2024 in [facility] Video Visit from 09/13/2024 in [facility] ED from 08/28/2024 in [facility] Emergency Department at [facility]  C-SSRS RISK CATEGORY Moderate Risk Moderate Risk No Risk     Assessment and Plan:   Theresa Cantrell is a 49 year old female with a past psychiatric history significant for bipolar 2 disorder  (major depressive episode), PTSD, and anxiety who presents to [facility] via virtual video visit for follow-up and medication management.  Patient presents to the encounter stating that she continues to take her medications regularly and denies experiencing any adverse side effects.  An aims assessment was performed with the patient scoring of 0.  Patient endorses some depressive symptoms but states that her episodes have not been as impactful.  She endorses anxiety attributed to stressors in her life.  A PHQ-9 screen was performed with the patient scoring an 11.  A GAD-7 screen was also performed with the patient scoring a 15.  Patient endorses stability on her current medication regimen and would like to continue taking her medications as prescribed.  Patient's medications to be e-prescribed to pharmacy of choice.  A [place] Suicide Severity Rating Scale was performed with the patient being considered moderate risk.  Patient denies suicidal ideations and is able to contract for safety following the conclusion of the encounter.  Safety planning was discussed with the patient prior to the conclusion of the encounter.  -  Patient was instructed to call 911 in the event of a mental health crisis. - Patient was instructed to call 15 Suicide and Crisis Lifeline in the event of a mental health crisis. - Patient was instructed to present to [facility] in the event of a mental health crisis  Collaboration of Care: Collaboration of Care: Medication Management AEB provider managing patient's psychiatric medications, Primary Care Provider AEB patient being seen by internal medicine, Psychiatrist AEB patient being seen by a mental health provider at this facility, and Referral or follow-up with counselor/therapist AEB patient being seen by a licensed clinical social worker at this facility  Patient/Guardian was advised Release of  Information must be obtained prior to any record release in order to collaborate their care with an outside provider. Patient/Guardian was advised if they have not already done so to contact the registration department to sign all necessary forms in order for us  to release information regarding their care.   Consent: Patient/Guardian gives verbal consent for treatment and assignment of benefits for services provided during this visit. Patient/Guardian expressed understanding and agreed to proceed.   1. Anxiety  - hydrOXYzine  (ATARAX ) 10 MG tablet; Take 1 tablet (10 mg total) by mouth 3 (three) times daily as needed.  Dispense: 75 tablet; Refill: 1 - FLUoxetine  (PROZAC ) 20 MG capsule; Take 1 capsule (20 mg total) by mouth daily.  Dispense: 30 capsule; Refill: 2  2. Bipolar 2 disorder, major depressive episode (HCC)  - ARIPiprazole  (ABILIFY ) 15 MG tablet; Take 1 tablet (15 mg total) by mouth daily.  Dispense: 30 tablet; Refill: 2 - traZODone  (DESYREL ) 150 MG tablet; Take 1 tablet (150 mg total) by mouth at bedtime as needed for sleep.  Dispense: 30 tablet; Refill: 2 - FLUoxetine  (PROZAC ) 20 MG capsule; Take 1 capsule (20 mg total) by mouth daily.  Dispense: 30 capsule; Refill: 2  3. PTSD (post-traumatic stress disorder)  - prazosin  (MINIPRESS ) 2 MG capsule; Take 1 capsule (2 mg total) by mouth at bedtime.  Dispense: 30 capsule; Refill: 2 - FLUoxetine  (PROZAC ) 20 MG capsule; Take 1 capsule (20 mg total) by mouth daily.  Dispense: 30 capsule; Refill: 2  4. Long term current use of antipsychotic medication (Primary)  Patient to follow up in 2 months Provider spent a total of 17 minutes with the patient/reviewing patient's chart  Reginia FORBES Bolster, PA 11/15/2024, 4:30 PM

## 2024-11-16 ENCOUNTER — Ambulatory Visit: Payer: MEDICAID | Admitting: Orthopaedic Surgery

## 2024-11-16 ENCOUNTER — Other Ambulatory Visit: Payer: Self-pay | Admitting: Internal Medicine

## 2024-11-16 ENCOUNTER — Other Ambulatory Visit: Payer: Self-pay

## 2024-11-16 ENCOUNTER — Other Ambulatory Visit (HOSPITAL_COMMUNITY): Payer: Self-pay

## 2024-11-16 MED FILL — Nifedipine Tab ER 24HR 30 MG: ORAL | 90 days supply | Qty: 90 | Fill #0 | Status: AC

## 2024-11-17 ENCOUNTER — Other Ambulatory Visit (HOSPITAL_COMMUNITY): Payer: Self-pay

## 2024-11-17 ENCOUNTER — Telehealth (INDEPENDENT_AMBULATORY_CARE_PROVIDER_SITE_OTHER): Payer: Self-pay

## 2024-11-17 NOTE — Telephone Encounter (Signed)
 Spoke to patient regarding hearing test results. Patient understood.

## 2024-11-18 ENCOUNTER — Other Ambulatory Visit (HOSPITAL_COMMUNITY): Payer: Self-pay

## 2024-11-20 NOTE — Progress Notes (Unsigned)
 Medical Nutrition Therapy  Appointment Start time:  *** Appointment End time:***  Primary concerns today: weight management  Referral diagnosis: Class 2 severe obesity due to excess calories with serious comorbidity and body mass index (BMI) of 35.0 to 35.9 in adult [facility])  Preferred learning style: no preference indicated Learning readiness: contemplating   NUTRITION ASSESSMENT   Clinical Medical Hx:  Past Medical History:  Diagnosis Date   Anxiety    Arthritis    Bipolar 1 disorder (HCC)    Depression    GERD (gastroesophageal reflux disease)    Hypertension    PTSD (post-traumatic stress disorder)    Sleep apnea     Medications:  Current Outpatient Medications:    ARIPiprazole  (ABILIFY ) 15 MG tablet, Take 1 tablet (15 mg total) by mouth daily., Disp: 30 tablet, Rfl: 2   Cholecalciferol  (VITAMIN D ) 50 MCG (2000 UT) tablet, Take 1 tablet (2,000 Units total) by mouth daily., Disp: 100 tablet, Rfl: 1   cyclobenzaprine  (FLEXERIL ) 10 MG tablet, Take 1 tablet (10 mg total) by mouth every 12 (twelve) hours as needed for spasm, Disp: 60 tablet, Rfl: 1   FLUoxetine  (PROZAC ) 20 MG capsule, Take 1 capsule (20 mg total) by mouth daily., Disp: 30 capsule, Rfl: 2   gabapentin  (NEURONTIN ) 300 MG capsule, Take 300 mg by mouth 3 (three) times daily., Disp: , Rfl:    gabapentin  (NEURONTIN ) 300 MG capsule, Take 1 capsule (300 mg total) by mouth 3 (three) times daily., Disp: 90 capsule, Rfl: 5   HYDROcodone -acetaminophen  (NORCO/VICODIN) 5-325 MG tablet, Take 1 tablet by mouth every 8 (eight) hours as needed for severe pain only, Disp: 90 tablet, Rfl: 0   HYDROcodone -acetaminophen  (NORCO/VICODIN) 5-325 MG tablet, Take 1 tablet by mouth every 8 (eight) hours as needed for severe pain only, Disp: 90 tablet, Rfl: 0   hydrOXYzine  (ATARAX ) 10 MG tablet, Take 1 tablet (10 mg total) by mouth 3 (three) times daily as needed., Disp: 75 tablet, Rfl: 1   meloxicam  (MOBIC ) 7.5 MG tablet, Take 1 tablet (7.5 mg  total) by mouth 2 (two) times daily as needed., Disp: 60 tablet, Rfl: 1   NIFEdipine  (ADALAT  CC) 30 MG 24 hr tablet, Take 1 tablet (30 mg total) by mouth daily., Disp: 90 tablet, Rfl: 0   olopatadine  (PATANOL) 0.1 % ophthalmic solution, Place 1 drop into both eyes 2 (two) times daily as needed for allergies (as needed for allergy symptoms)., Disp: 5 mL, Rfl: 12   omeprazole  (PRILOSEC) 20 MG capsule, Take 1 capsule (20 mg total) by mouth 2 (two) times daily before a meal., Disp: 180 capsule, Rfl: 0   prazosin  (MINIPRESS ) 2 MG capsule, Take 1 capsule (2 mg total) by mouth at bedtime., Disp: 30 capsule, Rfl: 2   spironolactone  (ALDACTONE ) 25 MG tablet, Take 0.5 tablets (12.5 mg total) by mouth daily., Disp: 30 tablet, Rfl: 3   traZODone  (DESYREL ) 150 MG tablet, Take 1 tablet (150 mg total) by mouth at bedtime as needed for sleep., Disp: 30 tablet, Rfl: 2   valACYclovir  (VALTREX ) 1000 MG tablet, Take 1 tablet (1,000 mg total) by mouth daily., Disp: 90 tablet, Rfl: 0   valsartan  (DIOVAN ) 160 MG tablet, Take 1 tablet (160 mg total) by mouth daily., Disp: 90 tablet, Rfl: 1  Labs:  Lab Results  Component Value Date   HGBA1C 5.7 (H) 04/24/2024   Notable Signs/Symptoms:  Wt Readings from Last 3 Encounters:  11/09/24 212 lb (96.2 kg)  09/21/24 205 lb (93 kg)  08/28/24  215 lb (97.5 kg)   Lifestyle & Dietary Hx ***   4/11, 6/6, 8/15 Pt presents today alone for follow up. Pt reports avoids sugary sweetened beverages 95% of the time and would like to aim for 100%. Pt reports selecting juice couple times weekly and this was strongly discouraged. Pt reports typical intake of 2-3 meals stating skipping a meal twice weekly.  Pt reports her appetite is up and down with wegovy .  Pt reports walking to mailbox every other day stating for an unknown amount of time.  Pt reports back pain with lifting restriction of 20# currently.   Pt reports she continue to limit fried foods to twice monthly.  Pt reports she  has home health 2 hours on 5 days weekly.  All Pt's questions were answered during this encounter.    Estimated daily fluid intake: ~67 ounces daily Supplements: daily MVI  Sleep: 5-8 hours nightly with CPAP  Stress / self-care: medium; therapy, talk to family/friends, play with the dog  Current average weekly physical activity: walking as tolerated   24-Hr Dietary Recall First Meal: 1-2 slices of wheat bread with egg, sausage or bacon, cheese or 1 banana Snack: none Second Meal: burger on bun, lettuce, tomato, mayo, fries, water or baked chicken breast, broccoli with cheese, potatoes Snack: none Third Meal: lasagana, corn  Snack: none Beverages: water, gatorade zero, sweet tea, orange juice, diet soda  NUTRITION DIAGNOSIS  NB-1.1 Food and nutrition-related knowledge deficit As related to limited nutrition related education.  As evidenced by Pt's reports and dietary recall-continue, improving  Learning Style & Readiness for Change Teaching method utilized: Visual & Auditory  Demonstrated degree of understanding via: Teach Back  Barriers to learning/adherence to lifestyle change: limited physical activity  Goals Established by Pt Avoid skipping meals  Aim for full omission of sugar sweetened beverages   MONITORING & EVALUATION Dietary intake, weekly physical activity  Next Steps  Patient is to PRN

## 2024-11-23 ENCOUNTER — Other Ambulatory Visit: Payer: Self-pay

## 2024-11-23 ENCOUNTER — Other Ambulatory Visit (HOSPITAL_COMMUNITY): Payer: Self-pay

## 2024-11-24 ENCOUNTER — Encounter: Payer: Self-pay | Admitting: Internal Medicine

## 2024-11-28 ENCOUNTER — Ambulatory Visit: Payer: MEDICAID | Admitting: Orthopaedic Surgery

## 2024-11-28 ENCOUNTER — Ambulatory Visit: Payer: MEDICAID | Attending: Internal Medicine | Admitting: Internal Medicine

## 2024-11-28 ENCOUNTER — Telehealth: Payer: Self-pay | Admitting: Internal Medicine

## 2024-11-28 ENCOUNTER — Other Ambulatory Visit (HOSPITAL_COMMUNITY): Payer: Self-pay

## 2024-11-28 ENCOUNTER — Encounter: Payer: Self-pay | Admitting: Internal Medicine

## 2024-11-28 ENCOUNTER — Other Ambulatory Visit: Payer: Self-pay

## 2024-11-28 VITALS — BP 113/70 | HR 104 | Temp 98.0°F | Ht 65.0 in | Wt 218.6 lb

## 2024-11-28 DIAGNOSIS — R7303 Prediabetes: Secondary | ICD-10-CM | POA: Diagnosis not present

## 2024-11-28 DIAGNOSIS — G4489 Other headache syndrome: Secondary | ICD-10-CM | POA: Diagnosis not present

## 2024-11-28 DIAGNOSIS — I10 Essential (primary) hypertension: Secondary | ICD-10-CM

## 2024-11-28 LAB — GLUCOSE, POCT (MANUAL RESULT ENTRY): POC Glucose: 118 mg/dL — AB (ref 70–99)

## 2024-11-28 LAB — POCT GLYCOSYLATED HEMOGLOBIN (HGB A1C): HbA1c, POC (prediabetic range): 6.1 % (ref 5.7–6.4)

## 2024-11-28 MED ORDER — PROPRANOLOL HCL ER 60 MG PO CP24
60.0000 mg | ORAL_CAPSULE | Freq: Every day | ORAL | 3 refills | Status: AC
  Filled 2024-11-28: qty 30, 30d supply, fill #0

## 2024-11-28 MED ORDER — TIRZEPATIDE-WEIGHT MANAGEMENT 2.5 MG/0.5ML ~~LOC~~ SOAJ
2.5000 mg | SUBCUTANEOUS | 0 refills | Status: AC
  Filled 2024-11-28: qty 2, 28d supply, fill #0

## 2024-11-28 MED ORDER — UBRELVY 50 MG PO TABS
ORAL_TABLET | ORAL | 2 refills | Status: AC
  Filled 2024-11-28: qty 10, 15d supply, fill #0
  Filled 2024-12-07: qty 10, 30d supply, fill #0

## 2024-11-28 MED ORDER — SPIRONOLACTONE 25 MG PO TABS
25.0000 mg | ORAL_TABLET | Freq: Every day | ORAL | 3 refills | Status: AC
  Filled 2024-11-28: qty 30, 30d supply, fill #0

## 2024-11-28 NOTE — Telephone Encounter (Signed)
 Copied from CRM [id number]. Topic: General - Other >> Nov 28, 2024  1:57 PM   Myrick T wrote:  Reason for CRM: patient called requesting provider send her sleep study results to [facility] phn#[phone]. Please f/u with [facility] as patient said that was all the info she had.
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
## 2024-11-28 NOTE — Patient Instructions (Addendum)
" °  VISIT SUMMARY: During your visit, we discussed your recent headaches, blood pressure management, weight management, and prediabetes. We reviewed your current medications and made some adjustments to help manage your symptoms and improve your overall health.  YOUR PLAN: -RECURRENT HEADACHE: You have been experiencing frequent headaches, which may be related to your blood pressure medication. We have ordered an MRI of your brain to rule out other causes. In the meantime, we have stopped your nifedipine  to see if it helps with the headaches. We have also prescribed Propranolol  to help reduce the frequency of your headaches and manage your blood pressure. For acute headache relief, you can take Ubrelvy , but do not exceed two tablets per day. We will need to get prior approval from your insurance.  -ESSENTIAL HYPERTENSION: Your blood pressure has improved with medication adjustments. We have stopped nifedipine , as it may be contributing to your headaches. You should continue taking spironolactone  and valsartan  as prescribed. We have also added propranolol  to help control your blood pressure and elevated pulse rate. Please continue to monitor your blood pressure at home.  - OBESITY WITH COMORBID SLEEP APNEA: Your BMI is 36.7, and you use a CPAP machine for sleep apnea. We have prescribed Zepbound  for weight management, pending insurance approval. We have also requested your sleep study report for insurance purposes. Please continue with your plan to start water aerobics once a week.  -PREDIABETES: Your A1c has increased from 5.7% to 6.1%, indicating prediabetes. It is important to continue your healthy eating habits and avoid sugary drinks and snacks. Participating in water aerobics once a week will also help manage your blood sugar levels.  INSTRUCTIONS: Please follow up with us  after your MRI or CT scan results are available. Continue to monitor your blood pressure at home and keep track of your headache  frequency and severity. If you experience any new or worsening symptoms, contact our office immediately.    Contains text generated by [employer].   "

## 2024-11-29 ENCOUNTER — Ambulatory Visit: Payer: Self-pay | Admitting: Internal Medicine

## 2024-11-29 ENCOUNTER — Encounter: Payer: Self-pay | Admitting: Internal Medicine

## 2024-11-29 ENCOUNTER — Encounter: Payer: MEDICAID | Admitting: Dietician

## 2024-11-29 ENCOUNTER — Other Ambulatory Visit (HOSPITAL_COMMUNITY): Payer: Self-pay

## 2024-11-29 DIAGNOSIS — Z6835 Body mass index (BMI) 35.0-35.9, adult: Secondary | ICD-10-CM

## 2024-11-29 LAB — BASIC METABOLIC PANEL WITH GFR
BUN/Creatinine Ratio: 21 (ref 9–23)
BUN: 20 mg/dL (ref 6–24)
CO2: 22 mmol/L (ref 20–29)
Calcium: 9.5 mg/dL (ref 8.7–10.2)
Chloride: 102 mmol/L (ref 96–106)
Creatinine, Ser: 0.94 mg/dL (ref 0.57–1.00)
Glucose: 122 mg/dL — ABNORMAL HIGH (ref 70–99)
Potassium: 4.4 mmol/L (ref 3.5–5.2)
Sodium: 139 mmol/L (ref 134–144)
eGFR: 74 mL/min/{1.73_m2}

## 2024-11-30 ENCOUNTER — Ambulatory Visit: Payer: Self-pay | Admitting: Pharmacist

## 2024-11-30 ENCOUNTER — Other Ambulatory Visit (HOSPITAL_COMMUNITY): Payer: Self-pay

## 2024-11-30 MED ORDER — HYDROCODONE-ACETAMINOPHEN 5-325 MG PO TABS: 1.0000 | ORAL_TABLET | Freq: Three times a day (TID) | ORAL | 0 refills | Status: AC | PRN

## 2024-12-01 ENCOUNTER — Ambulatory Visit (INDEPENDENT_AMBULATORY_CARE_PROVIDER_SITE_OTHER): Payer: MEDICAID | Admitting: Mental Health

## 2024-12-01 ENCOUNTER — Encounter: Payer: MEDICAID | Attending: Internal Medicine | Admitting: Dietician

## 2024-12-01 DIAGNOSIS — F3181 Bipolar II disorder: Secondary | ICD-10-CM

## 2024-12-01 DIAGNOSIS — F4310 Post-traumatic stress disorder, unspecified: Secondary | ICD-10-CM

## 2024-12-01 DIAGNOSIS — E66812 Obesity, class 2: Secondary | ICD-10-CM | POA: Diagnosis present

## 2024-12-01 DIAGNOSIS — Z6835 Body mass index (BMI) 35.0-35.9, adult: Secondary | ICD-10-CM | POA: Insufficient documentation

## 2024-12-01 NOTE — Progress Notes (Unsigned)
" ° °  THERAPIST PROGRESS NOTE Virtual Visit via Video Note  I connected with Theresa Cantrell on 12/01/24 at 11:00 AM EST by a video enabled telemedicine application and verified that I am speaking with the correct person using two identifiers.  Location: Patient: home address on file Provider: [facility] office   I discussed the limitations of evaluation and management by telemedicine and the availability of in person appointments. The patient expressed understanding and agreed to proceed.  I discussed the assessment and treatment plan with the patient. The patient was provided an opportunity to ask questions and all were answered. The patient agreed with the plan and demonstrated an understanding of the instructions.   The patient was advised to call back or seek an in-person evaluation if the symptoms worsen or if the condition fails to improve as anticipated.  I provided 54 minutes of non-face-to-face time during this encounter.   Ty Bernice Savant, [facility]   Session Time: 11:05 am ( 54 minutes)  Participation Level: Active  Behavioral Response: CasualAlertWNL  Type of Therapy: Individual Therapy  Treatment Goals addressed: STG: [place] will increase management of moods and stressors AEB development of x 3 effective coping skills with ability to process stressors in effective manner per self report within the next 90 days   ProgressTowards Goals: Progressing  Interventions: CBT and Supportive  Summary: Theresa Cantrell is a 49 y.o. female who presents with dx of bipolar disorder, PTSD and anxiety. Presents for session alert and oriented; mood and affect stable; adequate. Speech clear and coherent at normal rate and tone. Engaged and receptive to interventions. Shares for moods to be stable at this time and denies excessive highs or lows. Reports some improvement with connection to needed resources with having a care manager with [facility], notes this to have reduced stressors. Shares  presence of worry of delay of disability hearing and working to remain present focused. Explores with therapist things that are going well for her and presence of natural supports. Notes thoughts on ongoing pain and managing to cope. Ongoing work towards goals coping with daily stressors; moods stable. Denies safety concerns.   Suicidal/Homicidal: Nowithout intent/plan  Therapist Response:  Therapist engaged [place] in tele-therapy session. Completed check in and assessed for current level of functioning, sxs management and level of stressors. Explored ability to engage coping skills and ability to navigate stressors. Provided support and encouragement; validated feelings. Provided safe space to share thoughts and feelings and supported in processing thoughts. Explores ongoing areas of worry and factors that are going well and strengths present. Explores presence of natural supports and use of coping skills. Reviewed session and provided follow up.   Plan: Return again in x 6 weeks.  Diagnosis: Bipolar 2 disorder, major depressive episode (HCC)  PTSD (post-traumatic stress disorder)  Collaboration of Care: Other None  Patient/Guardian was advised Release of Information must be obtained prior to any record release in order to collaborate their care with an outside provider. Patient/Guardian was advised if they have not already done so to contact the registration department to sign all necessary forms in order for us  to release information regarding their care.   Consent: Patient/Guardian gives verbal consent for treatment and assignment of benefits for services provided during this visit. Patient/Guardian expressed understanding and agreed to proceed.   Ty Bernice [place], [facility] 12/01/2024  "

## 2024-12-02 ENCOUNTER — Other Ambulatory Visit: Payer: Self-pay | Admitting: Internal Medicine

## 2024-12-04 ENCOUNTER — Other Ambulatory Visit (HOSPITAL_COMMUNITY): Payer: Self-pay

## 2024-12-04 ENCOUNTER — Other Ambulatory Visit: Payer: Self-pay | Admitting: Internal Medicine

## 2024-12-04 DIAGNOSIS — G4489 Other headache syndrome: Secondary | ICD-10-CM

## 2024-12-04 MED ORDER — VITAMIN D 50 MCG (2000 UT) PO TABS
2000.0000 [IU] | ORAL_TABLET | Freq: Every day | ORAL | 1 refills | Status: AC
  Filled 2024-12-04: qty 100, 100d supply, fill #0

## 2024-12-04 NOTE — Telephone Encounter (Signed)
 Copied from CRM #[id number]. Topic: Clinical - Medication Refill >> Dec 04, 2024  8:51 AM Emylou G wrote: Medication: tirzepatide  (ZEPBOUND ) 2.5 MG/0.5ML Pen Ubrogepant  (UBRELVY ) 50 MG TABS   Has the patient contacted their pharmacy? No (Agent: If no, request that the patient contact the pharmacy for the refill. If patient does not wish to contact the pharmacy document the reason why and proceed with request.) (Agent: If yes, when and what did the pharmacy advise?)  This is the patient's preferred pharmacy:  [facility] [address] [place] [address] Phone: [phone] Fax: [phone]  Is this the correct pharmacy for this prescription? Yes If no, delete pharmacy and type the correct one.   Has the prescription been filled recently? No  Is the patient out of the medication? Yes  Has the patient been seen for an appointment in the last year OR does the patient have an upcoming appointment? Yes  Can we respond through MyChart? Yes  Agent: Please be advised that Rx refills may take up to 3 business days. We ask that you follow-up with your pharmacy.

## 2024-12-05 ENCOUNTER — Other Ambulatory Visit: Payer: Self-pay | Admitting: Nurse Practitioner

## 2024-12-05 ENCOUNTER — Other Ambulatory Visit (HOSPITAL_COMMUNITY): Payer: Self-pay

## 2024-12-05 ENCOUNTER — Ambulatory Visit
Admission: RE | Admit: 2024-12-05 | Discharge: 2024-12-05 | Disposition: A | Payer: MEDICAID | Source: Ambulatory Visit | Attending: Internal Medicine | Admitting: Internal Medicine

## 2024-12-05 ENCOUNTER — Ambulatory Visit
Admission: RE | Admit: 2024-12-05 | Discharge: 2024-12-05 | Disposition: A | Payer: MEDICAID | Source: Ambulatory Visit | Attending: Nurse Practitioner | Admitting: Nurse Practitioner

## 2024-12-05 DIAGNOSIS — R1032 Left lower quadrant pain: Secondary | ICD-10-CM

## 2024-12-05 DIAGNOSIS — G4489 Other headache syndrome: Secondary | ICD-10-CM

## 2024-12-06 ENCOUNTER — Telehealth (HOSPITAL_COMMUNITY): Payer: Self-pay

## 2024-12-06 ENCOUNTER — Other Ambulatory Visit (HOSPITAL_COMMUNITY): Payer: Self-pay

## 2024-12-06 NOTE — Telephone Encounter (Signed)
 PA required, please advise.Thanks!

## 2024-12-06 NOTE — Telephone Encounter (Addendum)
 PA required, please advise.Thanks!

## 2024-12-06 NOTE — Telephone Encounter (Addendum)
 I called and informed the patient that she needs to complete [facility] Release of Information form so [facility] can release the sleep study results to PCP. Patient acknowledged and had no further questions.

## 2024-12-07 ENCOUNTER — Other Ambulatory Visit: Payer: Self-pay

## 2024-12-08 ENCOUNTER — Other Ambulatory Visit (HOSPITAL_COMMUNITY): Payer: Self-pay

## 2024-12-12 ENCOUNTER — Ambulatory Visit (HOSPITAL_BASED_OUTPATIENT_CLINIC_OR_DEPARTMENT_OTHER): Payer: MEDICAID | Admitting: Physical Therapy

## 2024-12-12 ENCOUNTER — Ambulatory Visit: Payer: MEDICAID | Admitting: Orthopaedic Surgery

## 2024-12-19 ENCOUNTER — Ambulatory Visit (HOSPITAL_BASED_OUTPATIENT_CLINIC_OR_DEPARTMENT_OTHER): Payer: MEDICAID | Admitting: Physical Therapy

## 2024-12-26 ENCOUNTER — Ambulatory Visit (HOSPITAL_BASED_OUTPATIENT_CLINIC_OR_DEPARTMENT_OTHER): Payer: MEDICAID | Admitting: Physical Therapy

## 2024-12-28 ENCOUNTER — Ambulatory Visit: Payer: Self-pay | Admitting: Internal Medicine

## 2025-01-02 ENCOUNTER — Ambulatory Visit (HOSPITAL_BASED_OUTPATIENT_CLINIC_OR_DEPARTMENT_OTHER): Payer: MEDICAID | Admitting: Physical Therapy

## 2025-01-10 ENCOUNTER — Telehealth (HOSPITAL_COMMUNITY): Payer: MEDICAID | Admitting: Physician Assistant

## 2025-01-12 ENCOUNTER — Encounter: Payer: MEDICAID | Admitting: Dietician

## 2025-01-15 ENCOUNTER — Ambulatory Visit (HOSPITAL_COMMUNITY): Payer: MEDICAID | Admitting: Mental Health

## 2025-01-30 ENCOUNTER — Ambulatory Visit: Payer: MEDICAID | Admitting: Family Medicine

## 2025-03-12 ENCOUNTER — Ambulatory Visit: Payer: Self-pay | Admitting: Internal Medicine
